# Patient Record
Sex: Male | Born: 1945 | Race: White | Hispanic: No | Marital: Married | Smoking: Former smoker
Health system: Southern US, Community
[De-identification: ages and names within clinical notes are randomized; demographics above are authoritative.]

## PROBLEM LIST (undated history)

## (undated) DIAGNOSIS — N400 Enlarged prostate without lower urinary tract symptoms: Secondary | ICD-10-CM

## (undated) DIAGNOSIS — Z8669 Personal history of other diseases of the nervous system and sense organs: Secondary | ICD-10-CM

## (undated) DIAGNOSIS — M51369 Other intervertebral disc degeneration, lumbar region without mention of lumbar back pain or lower extremity pain: Secondary | ICD-10-CM

## (undated) DIAGNOSIS — I10 Essential (primary) hypertension: Secondary | ICD-10-CM

## (undated) DIAGNOSIS — E1165 Type 2 diabetes mellitus with hyperglycemia: Secondary | ICD-10-CM

## (undated) DIAGNOSIS — Z974 Presence of external hearing-aid: Secondary | ICD-10-CM

## (undated) DIAGNOSIS — I447 Left bundle-branch block, unspecified: Secondary | ICD-10-CM

## (undated) DIAGNOSIS — Z86010 Personal history of colonic polyps: Secondary | ICD-10-CM

## (undated) DIAGNOSIS — I2510 Atherosclerotic heart disease of native coronary artery without angina pectoris: Secondary | ICD-10-CM

## (undated) DIAGNOSIS — K5790 Diverticulosis of intestine, part unspecified, without perforation or abscess without bleeding: Secondary | ICD-10-CM

## (undated) DIAGNOSIS — M19011 Primary osteoarthritis, right shoulder: Secondary | ICD-10-CM

## (undated) DIAGNOSIS — M8580 Other specified disorders of bone density and structure, unspecified site: Secondary | ICD-10-CM

## (undated) DIAGNOSIS — E785 Hyperlipidemia, unspecified: Secondary | ICD-10-CM

## (undated) DIAGNOSIS — IMO0002 Reserved for concepts with insufficient information to code with codable children: Secondary | ICD-10-CM

## (undated) DIAGNOSIS — R51 Headache: Secondary | ICD-10-CM

## (undated) DIAGNOSIS — H269 Unspecified cataract: Secondary | ICD-10-CM

## (undated) DIAGNOSIS — E559 Vitamin D deficiency, unspecified: Secondary | ICD-10-CM

## (undated) DIAGNOSIS — M189 Osteoarthritis of first carpometacarpal joint, unspecified: Secondary | ICD-10-CM

## (undated) DIAGNOSIS — Z87442 Personal history of urinary calculi: Secondary | ICD-10-CM

## (undated) DIAGNOSIS — C801 Malignant (primary) neoplasm, unspecified: Secondary | ICD-10-CM

## (undated) DIAGNOSIS — M5136 Other intervertebral disc degeneration, lumbar region: Secondary | ICD-10-CM

## (undated) HISTORY — DX: Unspecified cataract: H26.9

## (undated) HISTORY — DX: Vitamin D deficiency, unspecified: E55.9

## (undated) HISTORY — DX: Left bundle-branch block, unspecified: I44.7

## (undated) HISTORY — DX: Diverticulosis of intestine, part unspecified, without perforation or abscess without bleeding: K57.90

## (undated) HISTORY — DX: Personal history of urinary calculi: Z87.442

## (undated) HISTORY — DX: Type 2 diabetes mellitus with hyperglycemia: E11.65

## (undated) HISTORY — DX: Personal history of other diseases of the nervous system and sense organs: Z86.69

## (undated) HISTORY — PX: BACK SURGERY: SHX140

## (undated) HISTORY — DX: Essential (primary) hypertension: I10

## (undated) HISTORY — DX: Other specified disorders of bone density and structure, unspecified site: M85.80

## (undated) HISTORY — PX: SHOULDER SURGERY: SHX246

## (undated) HISTORY — DX: Hyperlipidemia, unspecified: E78.5

## (undated) HISTORY — DX: Reserved for concepts with insufficient information to code with codable children: IMO0002

## (undated) HISTORY — DX: Other intervertebral disc degeneration, lumbar region without mention of lumbar back pain or lower extremity pain: M51.369

## (undated) HISTORY — DX: Benign prostatic hyperplasia without lower urinary tract symptoms: N40.0

## (undated) HISTORY — PX: KNEE SURGERY: SHX244

## (undated) HISTORY — DX: Atherosclerotic heart disease of native coronary artery without angina pectoris: I25.10

## (undated) HISTORY — DX: Osteoarthritis of first carpometacarpal joint, unspecified: M18.9

## (undated) HISTORY — PX: POLYPECTOMY: SHX149

## (undated) HISTORY — DX: Other intervertebral disc degeneration, lumbar region: M51.36

## (undated) HISTORY — DX: Personal history of colonic polyps: Z86.010

---

## 2004-07-16 HISTORY — PX: SKIN SURGERY: SHX2413

## 2005-06-30 ENCOUNTER — Emergency Department: Payer: Self-pay | Admitting: Emergency Medicine

## 2005-07-16 HISTORY — PX: FLEXIBLE SIGMOIDOSCOPY: SHX1649

## 2006-03-21 ENCOUNTER — Encounter: Admission: RE | Admit: 2006-03-21 | Discharge: 2006-03-21 | Payer: Self-pay | Admitting: Orthopaedic Surgery

## 2006-04-18 ENCOUNTER — Encounter: Admission: RE | Admit: 2006-04-18 | Discharge: 2006-04-18 | Payer: Self-pay | Admitting: Orthopaedic Surgery

## 2006-05-09 ENCOUNTER — Encounter: Admission: RE | Admit: 2006-05-09 | Discharge: 2006-05-09 | Payer: Self-pay | Admitting: Orthopaedic Surgery

## 2006-06-22 ENCOUNTER — Encounter: Admission: RE | Admit: 2006-06-22 | Discharge: 2006-06-22 | Payer: Self-pay | Admitting: Orthopaedic Surgery

## 2006-11-18 ENCOUNTER — Ambulatory Visit (HOSPITAL_COMMUNITY): Admission: RE | Admit: 2006-11-18 | Discharge: 2006-11-18 | Payer: Self-pay | Admitting: Neurosurgery

## 2006-11-20 ENCOUNTER — Ambulatory Visit (HOSPITAL_COMMUNITY): Admission: RE | Admit: 2006-11-20 | Discharge: 2006-11-20 | Payer: Self-pay | Admitting: Neurosurgery

## 2006-12-26 ENCOUNTER — Inpatient Hospital Stay (HOSPITAL_COMMUNITY): Admission: RE | Admit: 2006-12-26 | Discharge: 2006-12-30 | Payer: Self-pay | Admitting: Neurosurgery

## 2007-07-17 DIAGNOSIS — I2510 Atherosclerotic heart disease of native coronary artery without angina pectoris: Secondary | ICD-10-CM

## 2007-07-17 HISTORY — DX: Atherosclerotic heart disease of native coronary artery without angina pectoris: I25.10

## 2007-07-17 HISTORY — PX: CARDIAC CATHETERIZATION: SHX172

## 2007-09-03 LAB — CBC: platelet count: 310

## 2007-09-03 LAB — SODIUM: Sodium: 142 mmol/L (ref 137–147)

## 2007-09-03 LAB — POTASSIUM: Potassium: 4.2 mmol/L

## 2007-09-09 ENCOUNTER — Ambulatory Visit: Payer: Self-pay | Admitting: Internal Medicine

## 2008-08-12 ENCOUNTER — Ambulatory Visit (HOSPITAL_COMMUNITY): Admission: RE | Admit: 2008-08-12 | Discharge: 2008-08-12 | Payer: Self-pay | Admitting: Orthopaedic Surgery

## 2008-10-14 HISTORY — PX: US ECHOCARDIOGRAPHY: HXRAD669

## 2008-10-19 ENCOUNTER — Encounter: Admission: RE | Admit: 2008-10-19 | Discharge: 2008-10-19 | Payer: Self-pay | Admitting: Orthopaedic Surgery

## 2008-11-01 ENCOUNTER — Ambulatory Visit: Payer: Self-pay

## 2009-05-10 ENCOUNTER — Encounter: Admission: RE | Admit: 2009-05-10 | Discharge: 2009-05-10 | Payer: Self-pay | Admitting: Orthopaedic Surgery

## 2009-06-16 ENCOUNTER — Ambulatory Visit (HOSPITAL_COMMUNITY): Admission: RE | Admit: 2009-06-16 | Discharge: 2009-06-16 | Payer: Self-pay | Admitting: Orthopaedic Surgery

## 2009-07-16 HISTORY — PX: CARDIOVASCULAR STRESS TEST: SHX262

## 2009-08-08 ENCOUNTER — Ambulatory Visit (HOSPITAL_COMMUNITY): Admission: RE | Admit: 2009-08-08 | Discharge: 2009-08-08 | Payer: Self-pay | Admitting: Orthopaedic Surgery

## 2010-07-16 DIAGNOSIS — Z87442 Personal history of urinary calculi: Secondary | ICD-10-CM

## 2010-07-16 HISTORY — DX: Personal history of urinary calculi: Z87.442

## 2010-08-06 ENCOUNTER — Encounter: Payer: Self-pay | Admitting: Orthopaedic Surgery

## 2010-10-01 LAB — CBC
HCT: 38.8 % — ABNORMAL LOW (ref 39.0–52.0)
Hemoglobin: 13.5 g/dL (ref 13.0–17.0)
RBC: 4.15 MIL/uL — ABNORMAL LOW (ref 4.22–5.81)
RDW: 12.5 % (ref 11.5–15.5)
WBC: 6.1 10*3/uL (ref 4.0–10.5)

## 2010-10-01 LAB — BASIC METABOLIC PANEL
GFR calc Af Amer: >60 mL/min (ref >60)
GFR calc non Af Amer: >60 mL/min (ref >60)
Glucose, Bld: 151 mg/dL — ABNORMAL HIGH (ref 70–99)
Potassium: 4.9 mEq/L (ref 3.5–5.1)
Sodium: 141 mEq/L (ref 135–145)

## 2010-10-01 LAB — GLUCOSE, CAPILLARY

## 2010-10-17 LAB — GLUCOSE, CAPILLARY
Glucose-Capillary: 171 mg/dL — ABNORMAL HIGH (ref 70–99)
Glucose-Capillary: 189 mg/dL — ABNORMAL HIGH (ref 70–99)

## 2010-10-18 LAB — URINALYSIS, ROUTINE W REFLEX MICROSCOPIC
Bilirubin Urine: NEGATIVE
Glucose, UA: >1000 mg/dL — AB
Hgb urine dipstick: NEGATIVE
Ketones, ur: NEGATIVE mg/dL
Protein, ur: NEGATIVE mg/dL
pH: 5.5 (ref 5.0–8.0)

## 2010-10-18 LAB — COMPREHENSIVE METABOLIC PANEL
ALT: 31 U/L (ref 0–53)
Albumin: 4.3 g/dL (ref 3.5–5.2)
Alkaline Phosphatase: 71 U/L (ref 39–117)
Calcium: 9.7 mg/dL (ref 8.4–10.5)
GFR calc Af Amer: >60 mL/min (ref >60)
Potassium: 3.8 mEq/L (ref 3.5–5.1)
Sodium: 140 mEq/L (ref 135–145)
Total Protein: 6.9 g/dL (ref 6.0–8.3)

## 2010-10-18 LAB — DIFFERENTIAL
Basophils Relative: 1 % (ref 0–1)
Eosinophils Absolute: 0.2 10*3/uL (ref 0.0–0.7)
Lymphs Abs: 1.4 10*3/uL (ref 0.7–4.0)
Monocytes Absolute: 0.4 10*3/uL (ref 0.1–1.0)
Monocytes Relative: 7 % (ref 3–12)
Neutrophils Relative %: 66 % (ref 43–77)

## 2010-10-18 LAB — PROTIME-INR: INR: 1 (ref 0.00–1.49)

## 2010-10-18 LAB — URINE CULTURE
Colony Count: NO GROWTH
Culture: NO GROWTH

## 2010-10-18 LAB — CBC
MCHC: 34.5 g/dL (ref 30.0–36.0)
Platelets: 262 10*3/uL (ref 150–400)
RDW: 12.6 % (ref 11.5–15.5)

## 2010-10-18 LAB — APTT: aPTT: 30 seconds (ref 24–37)

## 2010-10-30 LAB — URINALYSIS, ROUTINE W REFLEX MICROSCOPIC
Glucose, UA: 250 mg/dL — AB
Hgb urine dipstick: NEGATIVE
pH: 6 (ref 5.0–8.0)

## 2010-10-30 LAB — DIFFERENTIAL
Basophils Absolute: 0 10*3/uL (ref 0.0–0.1)
Eosinophils Absolute: 0.4 10*3/uL (ref 0.0–0.7)
Eosinophils Relative: 6 % — ABNORMAL HIGH (ref 0–5)
Lymphocytes Relative: 22 % (ref 12–46)
Lymphs Abs: 1.5 10*3/uL (ref 0.7–4.0)
Monocytes Absolute: 0.5 10*3/uL (ref 0.1–1.0)

## 2010-10-30 LAB — COMPREHENSIVE METABOLIC PANEL
ALT: 30 U/L (ref 0–53)
AST: 22 U/L (ref 0–37)
Albumin: 4.1 g/dL (ref 3.5–5.2)
CO2: 27 mEq/L (ref 19–32)
Chloride: 108 mEq/L (ref 96–112)
Creatinine, Ser: 0.84 mg/dL (ref 0.4–1.5)
GFR calc Af Amer: >60 mL/min (ref >60)
Potassium: 4.4 mEq/L (ref 3.5–5.1)
Sodium: 141 mEq/L (ref 135–145)
Total Bilirubin: 0.9 mg/dL (ref 0.3–1.2)

## 2010-10-30 LAB — URINE CULTURE
Colony Count: NO GROWTH
Culture: NO GROWTH

## 2010-10-30 LAB — CBC
MCV: 92.9 fL (ref 78.0–100.0)
Platelets: 330 10*3/uL (ref 150–400)
RBC: 4.3 MIL/uL (ref 4.22–5.81)
WBC: 6.7 10*3/uL (ref 4.0–10.5)

## 2010-11-28 NOTE — Op Note (Signed)
NAMECRIST, KRUSZKA               ACCOUNT NO.:  [id number]   MEDICAL RECORD NO.:  [id number]          PATIENT TYPE:  INP   LOCATION:  2550                         FACILITY:  [facility]   PHYSICIAN:  [name], M.D.DATE OF BIRTH:  [birth year]   DATE OF PROCEDURE:  12/26/2006  DATE OF DISCHARGE:                               OPERATIVE REPORT   PREOPERATIVE DIAGNOSES:  1. Lumbar neuroforaminal stenosis.  2. Lumbar spondylosis.  3. Lumbar degenerative disk disease.  4. Lumbar radiculopathy.   POSTOPERATIVE DIAGNOSES:  1. Lumbar neuroforaminal stenosis.  2. Lumbar spondylosis.  3. Lumbar degenerative disk disease.  4. Lumbar radiculopathy.  5. Recurrent right LS lumbar disk herniation (spondylitic).   PROCEDURE:  1. Right L4-5 lumbar laminotomy, facetectomy and foraminotomy with      microdissection.  2. Bilateral L5-S1 lumbar laminotomy, facetectomy and foraminotomy      with microdissection.  3. Right L5-S1 microdiskectomy and transverse posterior lumbar      interbody fusion with AVS peak transverse interbody implant and      VITOSS with bone marrow aspirate.  4. Bilateral L4-S1 posterolateral arthrodesis with radius and      posterior instrumentation, and VITOSS with bone marrow aspirate and      Infuse.   SURGEON:  [name], M.D.   ASSISTANT:  Cristi Loron, M.D.   ANESTHESIA:  General endotracheal.   INDICATIONS:  The patient is a 65 year old man who presented with low  back and right lumbar radicular pain.  He has had two previous lumbar  surgeries years ago.  He was evaluated with MRI scan and myelogram and  with postmyelogram CT scan, which showed significant degenerative  changes with right-sided neuroforaminal stenosis at both L4-5 and L5-S1.  These areas may be treated with decompression and stabilization.   PROCEDURE:  The patient was brought to the operating room, placed under  general endotracheal anesthesia.  The patient was turned to a  prone  position and the lumbar region was prepped with Betadine soap solution  and draped in a sterile fashion.  The midline was infiltrated with local  anesthetic with epinephrine.  A midline incision was made and extended  through the previous midline incision.  It extended both rostral and  caudal to that.  Bipolar cautery and electrocautery was used to maintain  hemostasis.  Dissection was carried down to the lumbar fascia, which was  incised bilaterally.  The paraspinal muscles were resected from spinous  process lamina in subperiosteal fashion.  Self-retaining retractors were  placed and x-rays taken, and we identified the L4-5 and L5-S1  intralaminar spaces.  There was significant scarring in the previous  laminotomy defects on the right side at both L4-5 and L5-S1.   We began our dissection on the right side of the L5-S1.  The microscope  was draped and brought to the field to provide magnification,  illumination and visualization.  The decompression was performed using  microdissection and microsurgical technique.  Laminotomy and facetectomy  was performed using the XMax drill and Kerrison punches.  Dissection was  carried out laterally.  Facetectomy was  performed of the hypertrophic  facet.  We were able to identify the thecal sac.  There was extensive  scarring, and a small rent in the dura occurred.  This was sutured  closed with interrupted 6-0 Prolene sutures.  At the end of the  procedure, we laid a collagen matrix sponge over the repair.  We then  continued dissecting the lateral recess.  There was significant  spondylitic encroachment.  It was found that a portion of this was due  to recurrent spondylitic disk herniation, and this was removed.  We  entered into the disk space.  It was felt that it would be necessary to  perform an interbody fusion, as well.  Diskectomy was performed by  excising the disk herniation and removing it in a piecemeal fashion; and  entering  into the disk space, we continued to remove the degenerated  disk material.  A thorough discectomy was performed.  We then began to  prepare the endplates for the interbody fusion, using paddle curettes to  remove the cartilaginous surfaces.  We then proceeded to do a left L5-S1  laminotomy and facetectomy, exposing the thecal sac and nerve roots.  We  examined the areas of the disk; it was not protruding, and it was felt  that there was no significant compression of the thecal sac or L5 or S1  nerve roots, and therefore the diskectomy was not done from the left  side.  We measured the height of the intervertebral disk space and  selected a 10 mm transverse interbody implant.  We probed the left S1  pedicle and aspirated bone marrow aspirate from the vertebral body of  S1, which was injected over a 10 mL strip of VITOSS.  The VITOSS with  bone marrow aspirate was packed in the interbody implant and then  carefully retracted the thecal sac and nerve root.  The implant was  gently placed within the intervertebral disk space, countersunk and  rotated.  We then took additional VITOSS with bone marrow aspirate and  packed it in the interbody space, as well.  In the end, it was felt that  good decompression of the thecal sac and of the L5 and S1 nerve roots  had been achieved bilaterally.  We did Valsalva the patient after the  dural closure, and no CSF leakage was seen.  We then turned our  attention to the right L4-5 intralaminar space, again with the operating  microscope and microdissection, a right L4-5 lumbar laminotomy and  facetectomy was performed.  There was significant postsurgical scarring  as well as hypertrophic spondylitic overgrowth, and this was careful  removed.  We were able to decompress the thecal sac and the L4 and L5  nerve roots.  The facetectomy was performed to ensure that adequate  decompression for the L4 nerve root at the L4-5 level and for the L5 nerve root at the  L5-S1 level had been achieved.  We examined the anulus  of the disk; it bulged slightly, but there did not appear to be any  significant compression arising from the disk, and therefore diskectomy  and interbody fusion was not performed at L4-L5.  It was felt that the  patient did not have any neural compression on the left side, and  therefore there would be no advantage to exposing the left side at L4-5.   Once the decompression was completed, we then proceeded with  arthrodesis.  We identified pedicle entry sites through the pedicles at  L4, L5 and S1 bilaterally.  Each of the pedicles was probed, examined  with the ball probe.  No cutouts were found.  We then went ahead and  tapped each of the pedicles with a 5.25 mm tap at L4 and L5, and a 6.25  mm tap at S1.  Each, again, was examined with a ball probe.  No cutouts  were found.  And then we placed 5.75 x 45 mm screws bilaterally at L4  and L5, and 6.75 x 40 mm screw at S1.  AP view showed that the left L5  screw was too medial.  We again examined it.  It had breached the medial  aspect of the pedicle and therefore was removed, and we selected a more  lateral entry point and subsequent trajectory, again examining the  pedicle with a ball probe, and no cutout was found.  It was again tapped  with a 5.25 mm tap and the 5.75 x 45 mm screw was placed.  Another AP  view showed it in good position within the pedicle.  We then selected a  70-mm rod for the right side and a 60-mm rod for the left side.  These  were gently pre lordosed rods.  They were placed within the screw heads  and secured with locking caps.  Tightening at each level was performed  with a counter torque.  The bony surfaces were decorticated and then a  layer of Infuse was placed over the bony surfaces, which was then  subsequently covered with a layer of VITOSS with bone marrow aspirate.  Once the bone graft material was packed in place, we again examined the  laminotomy  defects to ensure that good decompression of the neural  structures remained, and then we proceeded with closure.  The VITOSS was  closed with interrupted #1 Vicryl sutures.  The Scarpa's fascia layer  was closed with interrupted inverted #1 Vicryl sutures in the  subcutaneous, subcuticular layers were closed with  interrupted inverted  2-0 Vicryl sutures.  Skin was closed with surgical staples.  The wound  was dressed with Adaptic and sterile gauze and Hyperfix.  The procedure  was tolerated well.  Estimated blood loss was  500 mL.  The patient was given back 200 mL of cell saver blood.  Sponge  and needle counts were correct. Following surgery, the patient was to be  turned back to the supine position, reversed from anesthetic, extubated  and transferred to the recovery room for further care.      [name], M.D.  Electronically Signed     RWN/MEDQ  D:  12/26/2006  T:  12/27/2006  Job:  [id number]

## 2010-11-28 NOTE — Discharge Summary (Signed)
Norman Marks, Norman Marks               ACCOUNT NO.:  [id number]   MEDICAL RECORD NO.:  [id number]          PATIENT TYPE:  INP   LOCATION:  3020                         FACILITY:  [facility]   PHYSICIAN:  [name], M.D.DATE OF BIRTH:  1946/04/08   DATE OF ADMISSION:  12/26/2006  DATE OF DISCHARGE:  12/30/2006                               DISCHARGE SUMMARY   HISTORY AND PHYSICAL:  The patient is a 65 year old man who is evaluated  for right lumbar radiculopathy, had 2 previous lumbar surgeries by Dr.  Durwin Nora a number of years ago.  He started with an MRI scan myelogram and  post myelogram CT scan and the decision made to proceed with  decompression of the arthrodesis.  The L4-5 and L5-S1 levels because of  the advanced degenerative disk disease, spondylosis, lateral recesses,  and neuroforaminal stenosis.  Examination showed good strength and  sensation.   HOSPITAL COURSE:  The patient admitted, underwent a right L4-5 lumbar  laminotomy, fasciectomy, foraminotomy; bilateral L5-S1 lumbar  laminotomy, fasciectomy, and foraminotomy; a right L5-S1 transverse  posterior lumbar interbody fusion with AVS peak interbody implants and V-  toss with bone aspirate, and a bilateral L4-S1 posterior arthrodesis  with radius posterior instrumentation, V-toss with bone aspirate and  infuse.   The patient had extensive scarring on the right side at both L4-5 and L5-  S1.  At the L5-S1 level a small rent occurred in the dural sac and this  was closed with 6-0 Prolene and we laid a collagen matrix sponge over  the repair.  He was kept flat in bed and log rolled side to side for 2  days and then we began to mobilize the patient.  The patient is  successfully mobilized and is up and ambulating in the halls.  His wound  is healing well and there is no swelling or drainage.  However,  yesterday he developed an area of erythema in the anterior right thigh  that appeared to Dr. Channing Mutters as cellulitis, it was  treated with Keflex and  warm compresses and it was much improved.  It was associated with a  temperature to 101.1, the temperature has now dropped to 99.1.  The  patient recalls receiving a shot for nausea in the anterior right thigh,  that area of cellulitis may well be related to that injection.   The patient and his wife have been given instruction on wound care and  activities.  He is to return later this week to have Dr. Channing Mutters remove his  staples and to return to see me in 3 weeks with x-rays.  In the meantime  he is to be actively ambulating out in the fresh air.  He has been  instructed to use Caltrate with vitamin D 1 tablet b.i.d.  We have also  given him a prescription for pain medication Dilaudid 2 mg tablets to  take 1 or 2 q.i.d. p.r.n. pain, 60 tablets prescribed, no refills.  We  have given a prescription for Keflex 500 mg tablets, he is to take 1  q.i.d.  We prescribed 40 tablets and  no refills.   DISCHARGE DIAGNOSES:  1. Lumbar stenosis.  2. Lumbar spondylosis.  3. Lumbar degenerative disk disease.  4. Lumbar radiculopathy.  5. Probable right anterior thigh cellulitis, now improving on Keflex,      probably related to an IM shot.      [name], M.D.  Electronically Signed     RWN/MEDQ  D:  12/30/2006  T:  12/30/2006  Job:  [id number]

## 2010-11-28 NOTE — H&P (Signed)
NAMEANMOL, FLECK               ACCOUNT NO.:  [id number]   MEDICAL RECORD NO.:  [id number]          PATIENT TYPE:  INP   LOCATION:  2550                         FACILITY:  [facility]   PHYSICIAN:  [name], M.D.DATE OF BIRTH:  16-Apr-1946   DATE OF ADMISSION:  12/26/2006  DATE OF DISCHARGE:                              HISTORY & PHYSICAL   HISTORY OF PRESENT ILLNESS:  The patient is a 65 year old right-handed  white male who was evaluated for degenerative changes of the lumbar  spine associated with lumbar radiculopathy.  He has had two previous  lumbar surgeries, by Dr. Loistine Chance C. Deaton in 1994.  And he has had  difficulties with his progressive pain from the low back extending into  the right buttock and the lateral aspect of the right hip and anterior  right thigh over the past seven or more months.  The patient had two  epidural steroid injections without relief.  He was treated with a 12-  day steroid Dosepak, but he could only take it for seven days because  his blood sugar got too high and he had to stop the medication.   The patient initially had MRI performed.  We subsequently saw him and  proceeded with a lumbar myelogram and post myelogram CT scan.  That was  done at [facility] last month.  The study shows extensive  postsurgical changes, as well as extensive degenerative changes.  In  particular, we see significant right-sided L4-5 and L5-S1 facet  arthropathy with resulting lateral recess and neuroforaminal stenosis at  each level.  Next, we discussed our options for further treatment,  including further epidural steroid injections, a limited surgery with  decompression or surgical decompression with arthrodesis, and after  discussing this, it was felt that the patient is now admitted for  decompression and arthrodesis.   PAST MEDICAL HISTORY:  Diabetes, for the past 14 years.  No history of  hypertension, myocardial infarction, cancer, stroke, peptic  ulcer  disease or lung disease.   PAST SURGICAL HISTORY:  Previous surgeries include his two lumbar  surgeries in 1994 by Dr. Elesa Hacker.   ALLERGIES:  HE REPORTS AN INTOLERANCE TO PERCOCET, CAUSING ITCHING, BUT  NO ACTUAL ALLERGIES TO MEDICATIONS.   MEDICATIONS:  Include Glyburide, metformin 2.5/500 b.i.d., Inderal 60 mg  daily, ramipril 6 mg daily, and aspirin 81 mg daily.   FAMILY HISTORY:  His parents have passed on.  His mother had lung cancer  and heart disease.  His father had lung cancer.   SOCIAL HISTORY:  The patient is married.  His wife is a patient of mine.  He does not smoke, drink alcoholic beverages or have a history of  substance abuse.   REVIEW OF SYSTEMS:  Review of systems is notable for those difficulties  described in the history of present illness and past medical history.   PAST MEDICAL HISTORY:  Past medical history is otherwise unremarkable.   PHYSICAL EXAMINATION:  GENERAL:  The patient is a well-developed, well-  nourished white male in no acute distress.  VITAL SIGNS:  His temperature is 97.1,  pulse 84, blood pressure 121/76,  respiratory rate 16, height 5 feet 9, weight 74 kg.  LUNGS:  Lungs are clear to auscultation.  He has symmetric respiratory  excursion.  HEART:  Heart is regular rate and rhythm.  Normal S1, S2.  There is no  murmur.  EXTREMITIES:  Extremity examination shows no clubbing, cyanosis or  edema.  MUSCULOSKELETAL:  Musculoskeletal examination shows he is able to flex  to 80 degrees, to extend to 20 degrees with minimal discomfort on  flexion and extension.  Straight leg raising is negative bilaterally.  There is no tenderness to palpation over the lumbar spinous process from  musculature.  NEUROLOGIC:  Neurologic examination shows full strength in the lower  extremities including the iliopsoas, quadriceps, dorsiflexor, extensor  hallucis longus and plantar flexor bilaterally.  Sensation is intact.  Pinprick to the upper and lower  extremities:  Reflexes are absent in the  biceps, brachial, triceps, minimal quadriceps, gastroc is symmetrical.  Toes are downgoing bilaterally.  He has a normal gait and stance.   IMPRESSION:  Patient with lumbar spondylosis and degenerative disk  disease with resulting in lateral recess and neuroforaminal stenosis,  right-sided, both at L4-5 and L5-S1 with resulting right lumbar  radiculopathy.  The patient is admitted now for L4-5 and L5-S1  decompression with arthrodesis.  We did discuss the nature of surgery,  typical length of surgery, hospitalization, and overall recuperation,  limitations postoperatively:  Our recommendation for postop  immobilization in a lumbar corset and risks to include risk of  infection, bleeding, possible transfusion, risk of nerve root  dysfunction with pain, weakness, numbness or paresthesias.  The risk of  dural tear and CSF leakage and possibly further surgery and a risk of  failure of the arthrodesis and possibly further surgery, and anesthetic  risks of myocardial infarction, stroke, and death.  They understand that  many of these risks are increased due to his history of diabetes.   Understanding all of this, he wishes to go ahead with surgery and is  admitted for such.    _      [name], M.D.  Electronically Signed     RWN/MEDQ  D:  12/26/2006  T:  12/27/2006  Job:  [id number]   cc:   Izell [place]. Elesa Hacker, M.D.

## 2010-11-28 NOTE — Op Note (Signed)
Norman Marks, Norman Marks               ACCOUNT NO.:  [id number]   MEDICAL RECORD NO.:  [id number]          PATIENT TYPE:  AMB   LOCATION:  SDS                          FACILITY:  [facility]   PHYSICIAN:  Claude Manges. Whitfield, M.D.DATE OF BIRTH:  1945/09/03   DATE OF PROCEDURE:  08/12/2008  DATE OF DISCHARGE:  08/12/2008                               OPERATIVE REPORT   PREOPERATIVE DIAGNOSIS:  Impingement, right shoulder.   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   PROCEDURE:  1. Diagnostic arthroscopy, right shoulder.  2. Debridement and synovectomy of glenohumeral joint.  3. Arthroscopic subacromial decompression.   SURGEON:  Claude Manges. Cleophas Dunker, MD   ANESTHESIA:  General with supplemental interscalene nerve block.   COMPLICATIONS:  None.   HISTORY:  A 65 year old gentleman has had problems with his right  shoulder for months.  He has no history of injury or trauma.  He has  evidence of impingement, but has not responded to time, exercises, anti-  inflammatory medicines, and subacromial injection.  He has had an MRI  scan that reveals mild adhesive capsulitis that he did not appear to  have clinically.  He had infra and supraspinatus tendinosis without  evidence of rotator cuff tear and some mild AC joint degenerative  changes which was also not symptomatic.  Because of his poor response to  the conservative treatment, he now wishes to proceed with arthroscopic  evaluation.   PROCEDURE:  With the patient comfortable on the operating table and  under general orotracheal anesthesia, the patient was placed in a semi-  sitting position with the shoulder frame.  Right shoulder was evaluated  under anesthesia and I did not think he had adhesive capsulitis.  Shoulder was then prepped with DuraPrep from the base of neck  circumferentially below the elbow.  Sterile draping was performed.   Marking pen was used to outline the [facility] joint,  the coracoid, and the  acromion about the point of fingerbreadth posterior and medial to the  posterior angle of the acromion, a small stab wound was made and the  arthroscope placed in the shoulder joint.  Diagnostic arthroscopy  revealed diffuse synovitis.  There was chondromalacia of the humeral  head and into a great extent of the glenoid, there was some fraying of  the anterior glenoid labrum and again diffuse synovitis.  A second  portal was established anteriorly.  __________ in the joint was  performed including synovectomy and any of the loose articular cartilage  particularly on the glenoid.  The subscapularis appeared to be intact.  The biceps was intact.  There were no loose bodies.   The arthroscope was then placed in the subacromial space posteriorly,  the cannula into subacromial space anteriorly and third portal was  established in the lateral subacromial space.  There was considerable  bursal material in the subacromial space.  This was evacuated with the  ArthroCare wand.  There was also considerable overhang of the anterior  acromion with obvious impingement and anterior-inferior acromioplasty  was performed with a 6-mm bur and I had a very  nice resection, nice dry  field, with any bleeding was controlled with the Bovie.  I looked at the  [facility] joint, I thought it was intact and therefore left it alone.  There  was no evidence of rotator cuff tear or any bursal surface tearing of  the cuff.   The anterior and lateral portals were closed with 4-0 Ethilon.  Sterile  bulky dressing was applied prior to which I did inject Xylocaine with  epinephrine.  Sling was applied and the patient returned to the  Postanesthesia Recovery Room without complication.   We planned to watch him for 23 hours' observation by the anesthesia and  thought he did very well and as along as he is stable in the recovery,  we will send them.  We will see him back in a week.  Dilaudid for pain  and  sling.      Claude Manges. Cleophas Dunker, M.D.  Electronically Signed     PWW/MEDQ  D:  08/12/2008  T:  08/12/2008  Job:  [id number]

## 2011-05-03 LAB — BASIC METABOLIC PANEL
BUN: 21
CO2: 29
Calcium: 9.4
Chloride: 106
Creatinine, Ser: 0.89
GFR calc Af Amer: >60
GFR calc non Af Amer: >60
Glucose, Bld: 125 — ABNORMAL HIGH
Potassium: 3.8
Sodium: 139

## 2011-05-03 LAB — CBC
HCT: 41.9
Hemoglobin: 14.1
MCHC: 33.6
MCV: 92.4
Platelets: 295
RBC: 4.54
RDW: 13.2
WBC: 7.9

## 2011-05-03 LAB — TYPE AND SCREEN
ABO/RH(D): O POS
Antibody Screen: NEGATIVE

## 2011-05-03 LAB — POCT I-STAT 4, (NA,K, GLUC, HGB,HCT)
Glucose, Bld: 185 — ABNORMAL HIGH
HCT: 30 — ABNORMAL LOW
Hemoglobin: 10.2 — ABNORMAL LOW
Operator id: 219291
Potassium: 3.7
Sodium: 137

## 2011-05-03 LAB — POCT I-STAT GLUCOSE
Glucose, Bld: 159 — ABNORMAL HIGH
Operator id: 219291

## 2011-05-03 LAB — ABO/RH: ABO/RH(D): O POS

## 2011-06-16 DIAGNOSIS — M8580 Other specified disorders of bone density and structure, unspecified site: Secondary | ICD-10-CM

## 2011-06-16 HISTORY — DX: Other specified disorders of bone density and structure, unspecified site: M85.80

## 2011-06-16 HISTORY — PX: OTHER SURGICAL HISTORY: SHX169

## 2012-01-28 ENCOUNTER — Ambulatory Visit: Payer: Self-pay | Admitting: Family Medicine

## 2012-02-01 ENCOUNTER — Ambulatory Visit (INDEPENDENT_AMBULATORY_CARE_PROVIDER_SITE_OTHER): Payer: Medicare Other | Admitting: Family Medicine

## 2012-02-01 ENCOUNTER — Encounter: Payer: Self-pay | Admitting: Family Medicine

## 2012-02-01 VITALS — BP 146/82 | HR 108 | Temp 98.0°F | Ht 69.0 in | Wt 160.0 lb

## 2012-02-01 DIAGNOSIS — M810 Age-related osteoporosis without current pathological fracture: Secondary | ICD-10-CM

## 2012-02-01 DIAGNOSIS — E1165 Type 2 diabetes mellitus with hyperglycemia: Secondary | ICD-10-CM

## 2012-02-01 DIAGNOSIS — Z8669 Personal history of other diseases of the nervous system and sense organs: Secondary | ICD-10-CM

## 2012-02-01 DIAGNOSIS — I10 Essential (primary) hypertension: Secondary | ICD-10-CM

## 2012-02-01 DIAGNOSIS — E785 Hyperlipidemia, unspecified: Secondary | ICD-10-CM

## 2012-02-01 DIAGNOSIS — N400 Enlarged prostate without lower urinary tract symptoms: Secondary | ICD-10-CM

## 2012-02-01 DIAGNOSIS — I2510 Atherosclerotic heart disease of native coronary artery without angina pectoris: Secondary | ICD-10-CM

## 2012-02-01 MED ORDER — SILODOSIN 8 MG PO CAPS: 8.0000 mg | ORAL_CAPSULE | Freq: Every day | ORAL | Status: DC

## 2012-02-01 MED ORDER — PROPRANOLOL HCL 60 MG PO TABS: 60.0000 mg | ORAL_TABLET | Freq: Every day | ORAL | Status: DC

## 2012-02-01 MED ORDER — RAMIPRIL 5 MG PO CAPS: 5.0000 mg | ORAL_CAPSULE | Freq: Every day | ORAL | Status: DC

## 2012-02-01 MED ORDER — GLYBURIDE-METFORMIN 2.5-500 MG PO TABS: 2.0000 | ORAL_TABLET | Freq: Two times a day (BID) | ORAL | Status: DC

## 2012-02-01 MED ORDER — TERIPARATIDE (RECOMBINANT) 600 MCG/2.4ML ~~LOC~~ SOLN: 20.0000 ug | Freq: Every day | SUBCUTANEOUS | Status: DC

## 2012-02-01 MED ORDER — LOVASTATIN 40 MG PO TABS: 40.0000 mg | ORAL_TABLET | Freq: Every day | ORAL | Status: DC

## 2012-02-01 MED ORDER — VITAMIN D (ERGOCALCIFEROL) 1.25 MG (50000 UNIT) PO CAPS: 50000.0000 [IU] | ORAL_CAPSULE | ORAL | Status: DC

## 2012-02-01 NOTE — Progress Notes (Signed)
Subjective:    Patient ID: Norman Marks, male    DOB: 11/10/1945, 66 y.o.   MRN: [id number]  HPI CC: new pt to establish  Cards Dr [name], prior saw PCP Dr. Patrecia Pace.  Presents with wife.  CAD - h/o 75% blockage.  Have requested records from Dr. [name] as well as [facility].  DM - diagnosed 18 yrs ago.  Compliant with lantus and glucovance bid.  Last A1c 6.5%.  occasional low sugars, which he feels.  Has hypoglycemic plan.  (uses relion glucose gel)  Gets vision exam yearly in fall.    Osteoporosis - denies steroid use, significant smoking hx.  Recently found osteoporosis, started on calcium 600mg  bid and vit D 50000 IU weekly, as well as forteo.  HTN - on ramipril and propranolol.  Was told by prior PCP to take qod dosing.  Preventative: Last CPE was early 2013, unsure when exactly.  Caffeine: 1 cup coffee/day. Lives with wife, 1 dog, 5 cats.  Medications and allergies reviewed and updated in chart.  Past histories reviewed and updated if relevant as below. There is no problem list on file for this patient.  Past Medical History  Diagnosis Date  . History of chicken pox   . Diabetes mellitus   . History of kidney stones 2012    x1  . Hx of migraines   . BPH (benign prostatic hypertrophy)   . HTN (hypertension)     mild   Past Surgical History  Procedure Date  . Back surgery     lumbar; x 3  . Knee surgery     Right  . Shoulder surgery     Right   History  Substance Use Topics  . Smoking status: Former Smoker    Types: Cigarettes  . Smokeless tobacco: Former Neurosurgeon    Types: Chew  . Alcohol Use: No   Family History  Problem Relation Age of Onset  . Cancer Father     lung and prostate  . Heart disease Mother   . Cancer Mother     unsure  . Coronary artery disease Sister 41  . Diabetes Father   . Diabetes Brother   . Stroke Neg Hx    Allergies  Allergen Reactions  . Percocet (Oxycodone-Acetaminophen) Itching   Current Outpatient Prescriptions on  File Prior to Visit  Medication Sig Dispense Refill  . Calcium Carb-Cholecalciferol (CALCIUM-VITAMIN D) 600-400 MG-UNIT TABS Take 1 tablet by mouth 2 (two) times daily.      Marland Kitchen glyBURIDE-metformin (GLUCOVANCE) 2.5-500 MG per tablet Take 2 tablets by mouth 2 (two) times daily with a meal.  120 tablet  11  . insulin glargine (LANTUS) 100 UNIT/ML injection Inject 12 Units into the skin daily.      Marland Kitchen lovastatin (MEVACOR) 40 MG tablet Take 1 tablet (40 mg total) by mouth at bedtime.  30 tablet  11  . propranolol (INDERAL) 60 MG tablet Take 1 tablet (60 mg total) by mouth daily.  30 tablet  11  . ramipril (ALTACE) 5 MG capsule Take 1 capsule (5 mg total) by mouth daily.  30 capsule  11     Review of Systems  Constitutional: Negative for fever, chills, activity change, appetite change, fatigue and unexpected weight change.  HENT: Negative for hearing loss and neck pain.   Eyes: Negative for visual disturbance.  Respiratory: Negative for cough, chest tightness, shortness of breath and wheezing.   Cardiovascular: Negative for chest pain, palpitations and leg swelling.  Gastrointestinal: Positive  for abdominal pain. Negative for nausea, vomiting, diarrhea, constipation, blood in stool and abdominal distention.  Genitourinary: Negative for hematuria and difficulty urinating.  Musculoskeletal: Negative for myalgias and arthralgias.  Skin: Negative for rash.  Neurological: Negative for dizziness, seizures, syncope and headaches.  Hematological: Does not bruise/bleed easily.  Psychiatric/Behavioral: Negative for dysphoric mood. The patient is not nervous/anxious.        Objective:   Physical Exam  Nursing note and vitals reviewed. Constitutional: He is oriented to person, place, and time. He appears well-developed and well-nourished. No distress.  HENT:  Head: Normocephalic and atraumatic.  Right Ear: Hearing, tympanic membrane, external ear and ear canal normal.  Left Ear: Hearing, tympanic  membrane, external ear and ear canal normal.  Nose: Nose normal.  Mouth/Throat: Oropharynx is clear and moist.  Eyes: Conjunctivae and EOM are normal. Pupils are equal, round, and reactive to light.  Neck: Normal range of motion. Neck supple. Carotid bruit is not present. No thyromegaly present.  Cardiovascular: Normal rate, regular rhythm, normal heart sounds and intact distal pulses.   No murmur heard. Pulses:      Radial pulses are 2+ on the right side, and 2+ on the left side.  Pulmonary/Chest: Effort normal and breath sounds normal. No respiratory distress. He has no wheezes. He has no rales.  Abdominal: Soft. Bowel sounds are normal. He exhibits no distension and no mass. There is no tenderness. There is no rebound and no guarding.  Musculoskeletal: Normal range of motion. He exhibits no edema.  Lymphadenopathy:    He has no cervical adenopathy.  Neurological: He is alert and oriented to person, place, and time.       CN grossly intact, station and gait intact  Skin: Skin is warm and dry. No rash noted.  Psychiatric: He has a normal mood and affect. His behavior is normal. Judgment and thought content normal.      Assessment & Plan:

## 2012-02-01 NOTE — Patient Instructions (Addendum)
Take ramipril and propranolol daily. Good to meet you, call us with questions. I've requested records from Dr. Patrecia Pace as well as Dr. [name]. Return in 3months for follow up, prior fasting for blood work.

## 2012-02-02 ENCOUNTER — Encounter: Payer: Self-pay | Admitting: Family Medicine

## 2012-02-02 ENCOUNTER — Other Ambulatory Visit: Payer: Self-pay | Admitting: Family Medicine

## 2012-02-02 DIAGNOSIS — N138 Other obstructive and reflux uropathy: Secondary | ICD-10-CM | POA: Insufficient documentation

## 2012-02-02 DIAGNOSIS — E559 Vitamin D deficiency, unspecified: Secondary | ICD-10-CM

## 2012-02-02 DIAGNOSIS — I2510 Atherosclerotic heart disease of native coronary artery without angina pectoris: Secondary | ICD-10-CM | POA: Insufficient documentation

## 2012-02-02 DIAGNOSIS — E118 Type 2 diabetes mellitus with unspecified complications: Secondary | ICD-10-CM | POA: Insufficient documentation

## 2012-02-02 DIAGNOSIS — M8580 Other specified disorders of bone density and structure, unspecified site: Secondary | ICD-10-CM | POA: Insufficient documentation

## 2012-02-02 DIAGNOSIS — Z8669 Personal history of other diseases of the nervous system and sense organs: Secondary | ICD-10-CM | POA: Insufficient documentation

## 2012-02-02 DIAGNOSIS — E785 Hyperlipidemia, unspecified: Secondary | ICD-10-CM

## 2012-02-02 DIAGNOSIS — I10 Essential (primary) hypertension: Secondary | ICD-10-CM

## 2012-02-02 DIAGNOSIS — E1169 Type 2 diabetes mellitus with other specified complication: Secondary | ICD-10-CM | POA: Insufficient documentation

## 2012-02-02 DIAGNOSIS — E1165 Type 2 diabetes mellitus with hyperglycemia: Secondary | ICD-10-CM

## 2012-02-02 NOTE — Assessment & Plan Note (Signed)
According to notes he brings, last A1c 11/2011 6.6%. Have requested full records from Dr. Patrecia Pace.

## 2012-02-02 NOTE — Assessment & Plan Note (Signed)
On ramipril and propranolol.  Mildly tachycardic today, will likely change B blocker to coreg bid.  Will review records from Dr. Patrecia Pace first.

## 2012-02-02 NOTE — Assessment & Plan Note (Signed)
Have requested records from Dr. [name]. On ASA, beta blocker (albeit propranolol), and ACEI.

## 2012-02-02 NOTE — Assessment & Plan Note (Signed)
On calcium/vit D and forteo injections daily. No dexa in records he brings so wilil request from Dr. Patrecia Pace.

## 2012-02-02 NOTE — Assessment & Plan Note (Signed)
Continue statin.  May check FLP next blood draw.

## 2012-02-02 NOTE — Assessment & Plan Note (Signed)
-  Continue rapaflo 

## 2012-02-06 ENCOUNTER — Encounter: Payer: Self-pay | Admitting: Family Medicine

## 2012-02-08 ENCOUNTER — Other Ambulatory Visit: Payer: Self-pay | Admitting: *Deleted

## 2012-02-08 MED ORDER — INSULIN PEN NEEDLE 31G X 6 MM MISC: Status: DC

## 2012-02-14 ENCOUNTER — Encounter: Payer: Self-pay | Admitting: Family Medicine

## 2012-02-22 ENCOUNTER — Encounter: Payer: Self-pay | Admitting: Family Medicine

## 2012-03-02 ENCOUNTER — Other Ambulatory Visit: Payer: Self-pay | Admitting: Family Medicine

## 2012-04-21 LAB — HM DIABETES EYE EXAM

## 2012-05-02 ENCOUNTER — Other Ambulatory Visit (INDEPENDENT_AMBULATORY_CARE_PROVIDER_SITE_OTHER): Payer: Medicare Other

## 2012-05-02 DIAGNOSIS — E559 Vitamin D deficiency, unspecified: Secondary | ICD-10-CM

## 2012-05-02 DIAGNOSIS — I10 Essential (primary) hypertension: Secondary | ICD-10-CM

## 2012-05-02 DIAGNOSIS — E1165 Type 2 diabetes mellitus with hyperglycemia: Secondary | ICD-10-CM

## 2012-05-02 DIAGNOSIS — E785 Hyperlipidemia, unspecified: Secondary | ICD-10-CM

## 2012-05-02 DIAGNOSIS — IMO0001 Reserved for inherently not codable concepts without codable children: Secondary | ICD-10-CM

## 2012-05-02 DIAGNOSIS — IMO0002 Reserved for concepts with insufficient information to code with codable children: Secondary | ICD-10-CM

## 2012-05-02 LAB — COMPREHENSIVE METABOLIC PANEL
AST: 16 U/L (ref 0–37)
Albumin: 3.9 g/dL (ref 3.5–5.2)
BUN: 14 mg/dL (ref 6–23)
CO2: 29 mEq/L (ref 19–32)
Calcium: 9.1 mg/dL (ref 8.4–10.5)
Chloride: 104 mEq/L (ref 96–112)
Creatinine, Ser: 0.7 mg/dL (ref 0.4–1.5)
GFR: 119.45 mL/min (ref >60.00)
Glucose, Bld: 187 mg/dL — ABNORMAL HIGH (ref 70–99)
Potassium: 4.5 mEq/L (ref 3.5–5.1)

## 2012-05-02 LAB — LIPID PANEL
Cholesterol: 111 mg/dL (ref 0–200)
HDL: 35.8 mg/dL — ABNORMAL LOW (ref >39.00)
Total CHOL/HDL Ratio: 3
Triglycerides: 106 mg/dL (ref 0.0–149.0)

## 2012-05-02 LAB — MICROALBUMIN / CREATININE URINE RATIO: Microalb, Ur: 0.6 mg/dL (ref 0.0–1.9)

## 2012-05-02 LAB — HEMOGLOBIN A1C: Hgb A1c MFr Bld: 7.6 % — ABNORMAL HIGH (ref 4.6–6.5)

## 2012-05-09 ENCOUNTER — Ambulatory Visit (INDEPENDENT_AMBULATORY_CARE_PROVIDER_SITE_OTHER): Payer: Medicare Other | Admitting: Family Medicine

## 2012-05-09 ENCOUNTER — Encounter: Payer: Self-pay | Admitting: Family Medicine

## 2012-05-09 VITALS — BP 128/74 | HR 96 | Temp 97.9°F | Wt 160.8 lb

## 2012-05-09 DIAGNOSIS — E291 Testicular hypofunction: Secondary | ICD-10-CM

## 2012-05-09 DIAGNOSIS — E349 Endocrine disorder, unspecified: Secondary | ICD-10-CM

## 2012-05-09 DIAGNOSIS — IMO0001 Reserved for inherently not codable concepts without codable children: Secondary | ICD-10-CM

## 2012-05-09 DIAGNOSIS — N400 Enlarged prostate without lower urinary tract symptoms: Secondary | ICD-10-CM

## 2012-05-09 DIAGNOSIS — E1165 Type 2 diabetes mellitus with hyperglycemia: Secondary | ICD-10-CM

## 2012-05-09 DIAGNOSIS — M810 Age-related osteoporosis without current pathological fracture: Secondary | ICD-10-CM

## 2012-05-09 DIAGNOSIS — I10 Essential (primary) hypertension: Secondary | ICD-10-CM

## 2012-05-09 DIAGNOSIS — I2510 Atherosclerotic heart disease of native coronary artery without angina pectoris: Secondary | ICD-10-CM

## 2012-05-09 DIAGNOSIS — E785 Hyperlipidemia, unspecified: Secondary | ICD-10-CM

## 2012-05-09 LAB — LUTEINIZING HORMONE: LH: 6.49 m[IU]/mL (ref 1.50–9.30)

## 2012-05-09 MED ORDER — CARVEDILOL 3.125 MG PO TABS: 3.1250 mg | ORAL_TABLET | Freq: Two times a day (BID) | ORAL | Status: DC

## 2012-05-09 MED ORDER — INSULIN GLARGINE 100 UNIT/ML ~~LOC~~ SOLN: 12.0000 [IU] | SUBCUTANEOUS | Status: DC

## 2012-05-09 NOTE — Assessment & Plan Note (Addendum)
Merits close f/u if testosterone supplementation started. Has stopped rapaflo.  Didn't think needed this medicine.

## 2012-05-09 NOTE — Assessment & Plan Note (Signed)
Chronic. Stable. Again mildly tachycardic today. Change from propranolol to coreg. Monitor at [facility].

## 2012-05-09 NOTE — Assessment & Plan Note (Signed)
Chronic, deteriorated control. Prior very well controlled. Discussed addition of januvia vs increase lantus. As starting coreg anticipate some improvement in glycemic control. Regardless will start with slow titration of lantus to goal 15 u daily, but asked him to keep track of mid day sugars to ensure no lows. Pt agrees with plan. Foot exam today.

## 2012-05-09 NOTE — Assessment & Plan Note (Signed)
Chronic, stable. Reviewed FLP with pt - adequate control.  Continue statin. Lab Results  Component Value Date   CHOL 111 05/02/2012   HDL 35.80* 05/02/2012   LDLCALC 54 05/02/2012   TRIG 106.0 05/02/2012   CHOLHDL 3 05/02/2012

## 2012-05-09 NOTE — Patient Instructions (Addendum)
Return in 4 months for medicare wellness visit, prior fasting for blood work. Blood work today. Stop propranolol today, start carvedilol at 3.125mg  twice daily - keep eye on blood pressures with the change. Increase lantus by 1 unit every 3 days if fasting sugars averaging consistently >150 to a goal of 15 units of lantus.  With that I do want you to check occasional PM sugars 2 hours after lunch or dinner to ensure not dropping too low. Keep an eye on diet - work on aerobic exercise.

## 2012-05-09 NOTE — Assessment & Plan Note (Addendum)
As of last dexa.  On forteo, calcium/vit D and weekly vit D. Has had low testosterone on past - see below.

## 2012-05-09 NOTE — Assessment & Plan Note (Signed)
Low reading in past at Dr. Gregery Na office. Discussed common symptoms of hypogonadism as well as recommended w/u. Discussed implications of testosterone replacement and need for routine monitoring. Pt desires to pursue evaluation and will then decide on treatment. Draw testosterone panel today along with LH.

## 2012-05-09 NOTE — Assessment & Plan Note (Signed)
Asxs. On ASA, and ACEI.  Switch Bblocker to coreg today.

## 2012-05-09 NOTE — Progress Notes (Signed)
  Subjective:    Patient ID: TAJAI IHDE, male    DOB: [birth year], 66 y.o.   MRN: [id number]  HPI CC: 35mo f/u  Mr. Banh is a pleasant patient with h/o medically treated CAD, HTN, HLD, T2DM, BPH, and osteoporosis who presents today for 3 mo f/u.  Osteoporosis - takes forteo daily and vitD/calcium daily, vit D 50000 IU weekly.  No recent falls or fractures.  No smoking. Low testosterone level in past.  Had injection x1.  Would like further evaluation of this.  DM - sugars running higher.  No low sugars or hypoglycemic sxs.  Vision exam a few weeks ago.  Checks sugars fasting in morning QD.  No paresthesias.    Foot exam today.  Already had pneumovax. Lab Results  Component Value Date   HGBA1C 7.6* 05/02/2012   H/o CAD medically treated, h/o HTN - compliant with meds.  No HA, vision changes, CP/tightness, SOB, leg swelling.  Has seen Dr. [name] in past, not recently.  Has not had shingles shot.  Not interested 2/2 financial cost.  Past Medical History  Diagnosis Date  . History of chicken pox   . Diabetes type 2, uncontrolled   . Hx of migraines   . BPH (benign prostatic hypertrophy)   . HTN (hypertension)     mild  . HLD (hyperlipidemia)   . CAD (coronary artery disease) 2009    [name] (cardiolite 2009 with 70% stenosis of proximal ramus, not amenable to PCI, rec treat medically)  . Osteoporosis 06/2011    T score -3  . History of nephrolithiasis 2012    L ureter, R renal pelvis  . DDD (degenerative disc disease), lumbar     s/p surgery  . Vitamin D deficiency   . History of bundle branch block      Review of Systems Per HPI    Objective:   Physical Exam  Nursing note and vitals reviewed. Constitutional: He is oriented to person, place, and time. He appears well-developed and well-nourished. No distress.  HENT:  Head: Normocephalic and atraumatic.  Nose: Nose normal.  Mouth/Throat: Oropharynx is clear and moist. No oropharyngeal exudate.  Eyes: Conjunctivae  normal and EOM are normal. Pupils are equal, round, and reactive to light. No scleral icterus.  Neck: Normal range of motion. Neck supple. Carotid bruit is not present.  Cardiovascular: Normal rate, regular rhythm, normal heart sounds and intact distal pulses.   No murmur heard. Pulses:      Radial pulses are 2+ on the right side, and 2+ on the left side.  Pulmonary/Chest: Effort normal and breath sounds normal. No respiratory distress. He has no wheezes. He has no rales.  Musculoskeletal: Normal range of motion. He exhibits no edema.       Diabetic foot exam: Normal inspection No skin breakdown No calluses  Normal DP/PT pulses Normal sensation to light tough and monofilament Nails normal   Lymphadenopathy:    He has no cervical adenopathy.  Neurological: He is alert and oriented to person, place, and time.       CN grossly intact, station and gait intact  Skin: Skin is warm and dry. No rash noted.  Psychiatric: He has a normal mood and affect. His behavior is normal. Judgment and thought content normal.       Assessment & Plan:

## 2012-05-12 LAB — TESTOSTERONE, FREE, TOTAL, SHBG
Sex Hormone Binding: 34 nmol/L (ref 13–71)
Testosterone, Free: 41.4 pg/mL — ABNORMAL LOW (ref 47.0–244.0)
Testosterone-% Free: 1.9 % (ref 1.6–2.9)
Testosterone: 220.48 ng/dL — ABNORMAL LOW (ref 300–890)

## 2012-05-21 ENCOUNTER — Ambulatory Visit (INDEPENDENT_AMBULATORY_CARE_PROVIDER_SITE_OTHER): Payer: Medicare Other | Admitting: Family Medicine

## 2012-05-21 ENCOUNTER — Encounter: Payer: Self-pay | Admitting: Family Medicine

## 2012-05-21 VITALS — BP 108/68 | HR 84 | Temp 98.1°F | Wt 157.8 lb

## 2012-05-21 DIAGNOSIS — I10 Essential (primary) hypertension: Secondary | ICD-10-CM

## 2012-05-21 DIAGNOSIS — N400 Enlarged prostate without lower urinary tract symptoms: Secondary | ICD-10-CM

## 2012-05-21 DIAGNOSIS — M810 Age-related osteoporosis without current pathological fracture: Secondary | ICD-10-CM

## 2012-05-21 DIAGNOSIS — E349 Endocrine disorder, unspecified: Secondary | ICD-10-CM

## 2012-05-21 DIAGNOSIS — E291 Testicular hypofunction: Secondary | ICD-10-CM

## 2012-05-21 DIAGNOSIS — E1165 Type 2 diabetes mellitus with hyperglycemia: Secondary | ICD-10-CM

## 2012-05-21 LAB — PSA: PSA: 2.11 ng/mL (ref 0.10–4.00)

## 2012-05-21 MED ORDER — METFORMIN HCL 500 MG PO TABS: 500.0000 mg | ORAL_TABLET | Freq: Two times a day (BID) | ORAL | Status: DC

## 2012-05-21 MED ORDER — TESTOSTERONE 20.25 MG/1.25GM (1.62%) TD GEL: 20.2500 mg | Freq: Every day | TRANSDERMAL | Status: DC

## 2012-05-21 NOTE — Assessment & Plan Note (Signed)
Decreased sugars with addition of coreg.  Advised to monitor for hypoglycemia. As on insulin, will stop glipizide and only treat with metformin.

## 2012-05-21 NOTE — Assessment & Plan Note (Addendum)
With normal LH, ?secondary hypogonadism, check T4 and prolactin today.  Already had normal TSH. Discussed treatment of testosterone as well as things that will need to be monitored including prostate (with BPH and fmhx prostate cancer - including possibility of feeding indolent prostate cancer), liver, cholesterol levels, hemoglobin. Discussed different treatment options - start with androgel.  Have printed out script for pt to take to pharmacy.   Discussed use and not having wife exposed to this. Return to clinic in 1 month to recheck levels, keep appt for early next week. Check PSA today.

## 2012-05-21 NOTE — Progress Notes (Signed)
  Subjective:    Patient ID: Norman Marks, male    DOB: 1946-02-26, 66 y.o.   MRN: [id number]  HPI CC: discuss testosterone levels.  Low testosterone level in past. Had injection x1 by prior PCP. Endorses fatigue and depressed mood, mildly irritable.  Denies trouble with sex drive/libido.  Recent testosterone check - LH was normal.  Total and free testosterone were low. Lab Results  Component Value Date   TESTOSTERONE 220.48* 05/09/2012   H/o BPH, unclear if currently on rapaflo. No recent PSA.  That has been normal in past.  Never seen urologist. H/o osteoporosis. Father with h/o prostate cancer - father dx at age of 65s.  DM - has been having some low sugars since starting carvedilol.  Low of 41.  Has had to back off lantus, currently taking 11 units.  On glucovance bid.   Lab Results  Component Value Date   CREATININE 0.7 05/02/2012    HTN - recently changed from propranolol to carvedilol.  Denies hypotensive sxs.  Advised to keep track of bp at home.  Notes well controlled bp at home. BP Readings from Last 3 Encounters:  05/21/12 108/68  05/09/12 128/74  02/01/12 146/82    Past Medical History  Diagnosis Date  . History of chicken pox   . Diabetes type 2, uncontrolled   . Hx of migraines   . BPH (benign prostatic hypertrophy)   . HTN (hypertension)     mild  . HLD (hyperlipidemia)   . CAD (coronary artery disease) 2009    [name] (cardiolite 2009 with 70% stenosis of proximal ramus, not amenable to PCI, rec treat medically)  . Osteoporosis 06/2011    T score -3  . History of nephrolithiasis 2012    L ureter, R renal pelvis  . DDD (degenerative disc disease), lumbar     s/p surgery  . Vitamin D deficiency   . History of bundle branch block     Family History  Problem Relation Age of Onset  . Cancer Father     lung and prostate  . Heart disease Mother   . Cancer Mother     unsure  . Coronary artery disease Sister 8  . Diabetes Father   . Diabetes  Brother   . Stroke Neg Hx     Review of Systems Per HPI    Objective:   Physical Exam  Nursing note and vitals reviewed. Genitourinary: Prostate normal. Rectal exam shows external hemorrhoid. Rectal exam shows no internal hemorrhoid, no fissure, no mass, no tenderness and anal tone normal. Prostate is not enlarged (20gm, firm throughout) and not tender.       Assessment & Plan:

## 2012-05-21 NOTE — Assessment & Plan Note (Signed)
Very stable on low dose coreg and ramipril.  Advised to monitor for hypotension. Improved tachycardia.

## 2012-05-21 NOTE — Assessment & Plan Note (Signed)
Testosterone supplementation may help with this.

## 2012-05-21 NOTE — Assessment & Plan Note (Signed)
Not taking rapaflo currently. discussed possible worsening of BPH sxs if testosterone started.   Will closely monitor.

## 2012-05-21 NOTE — Patient Instructions (Addendum)
Let's try stopping glucovance and starting metformin at 500mg  twice daily (i've sent this into pharmacy). Keep close eye on blood pressure and blood sugars while we're making these changes. For testosterone - try androgel cream one application onto skin daily. Return in 1 month for labwork to check testosterone levels again. Good to see you today, call us with quesitons.

## 2012-05-22 ENCOUNTER — Encounter: Payer: Self-pay | Admitting: *Deleted

## 2012-05-22 LAB — PROLACTIN: Prolactin: 7.6 ng/mL (ref 2.1–17.1)

## 2012-05-26 ENCOUNTER — Telehealth: Payer: Self-pay

## 2012-05-26 MED ORDER — TESTOSTERONE 50 MG/5GM (1%) TD GEL: 5.0000 g | Freq: Every day | TRANSDERMAL | Status: DC

## 2012-05-26 NOTE — Telephone Encounter (Signed)
[facility] [address] faxed note Androgel would require a prior auth; does Dr Sharen Hones want to try Androderm or Testim which are preferred by insurance co/ Please advise.

## 2012-05-26 NOTE — Telephone Encounter (Signed)
Rx called in as directed.   

## 2012-05-26 NOTE — Telephone Encounter (Signed)
Ok to try testim, have sent in new med.  plz phone in if able to.

## 2012-06-22 ENCOUNTER — Other Ambulatory Visit: Payer: Self-pay | Admitting: Family Medicine

## 2012-06-22 DIAGNOSIS — E349 Endocrine disorder, unspecified: Secondary | ICD-10-CM

## 2012-06-24 ENCOUNTER — Other Ambulatory Visit: Payer: Self-pay | Admitting: Family Medicine

## 2012-06-25 ENCOUNTER — Other Ambulatory Visit: Payer: Self-pay | Admitting: Family Medicine

## 2012-06-25 ENCOUNTER — Other Ambulatory Visit (INDEPENDENT_AMBULATORY_CARE_PROVIDER_SITE_OTHER): Payer: 59

## 2012-06-25 DIAGNOSIS — E349 Endocrine disorder, unspecified: Secondary | ICD-10-CM

## 2012-06-25 DIAGNOSIS — E291 Testicular hypofunction: Secondary | ICD-10-CM

## 2012-06-25 LAB — TESTOSTERONE: Testosterone: 184.22 ng/dL — ABNORMAL LOW (ref 350.00–890.00)

## 2012-06-25 MED ORDER — TESTOSTERONE 50 MG/5GM (1%) TD GEL: 10.0000 g | Freq: Every day | TRANSDERMAL | Status: DC

## 2012-06-25 NOTE — Progress Notes (Signed)
Rx called in as directed.   

## 2012-06-25 NOTE — Progress Notes (Signed)
plz phone in testosterone supplementation new dose 10gm daily.

## 2012-06-27 ENCOUNTER — Telehealth: Payer: Self-pay | Admitting: Family Medicine

## 2012-06-27 NOTE — Telephone Encounter (Signed)
plz verify with pharmacy.  Sounds like pharmacy mistake - shouldn't have been a problem as I specified to send in quantity sufficient for 1 month on new dose.  I want enough for 10gm on skin daily. Pharmacy should have filled enough for 1 month. Then notify pt.

## 2012-06-27 NOTE — Telephone Encounter (Signed)
Spoke with pharmacist and she said he was given 30 individual tubes and was to use 1 a day. I advised that he was to use 10 grams a day. She isn't sure if it was written wrong on the hard copy or what the problem was but said she would fix it and give him enough to last through the month with 5 refills. She also said the manufacturer considered the 5 mg tube the normal dose and does not produce a larger tube. Patient aware and will check with pharmacy later to pick up remaining medication.

## 2012-06-27 NOTE — Telephone Encounter (Signed)
Patient Information:  Caller Name: Keishawn  Phone: [phone]  Patient: Norman Marks, Norman Marks  Gender: Male  DOB: 08/03/1945  Age: 66 Years  PCP: Eustaquio Boyden [facility])  Office Follow Up:  Does the office need to follow up with this patient?: Yes  Instructions For The Office: PATIENT REQUESTING ADDIT. REFILL ON TESTOSTERONE. THE WAY THE MOST WRITTEN SCRIPT WRITTEN WILL HAVE PATIENT RUNNING OUT OF MED HALF WAY THROUGH THE 30 DAY SUPPLY. (PLEASE REVIEW TRIAGE NOTE.)  RN Note:  Med request to be sent to Dr. Sharen Hones.  Symptoms  Reason For Call & Symptoms: Caller states he spoke with  someone ( unknown ) in the office regarding his Testosterone gel on 06/25/12 and was advised to double up on his dose of 50mg /5gm to 100mg /10gm daily . (Reviewed EPIC)  Caller states when he went to pick up his prescription at [facility] in [place], the med was written for the  50mg /5gm dose and directions were to apply 10 g to skin. Patient states if he doubles the dose of med prescribed , he will run out of the med half way through the 30 day  supply and is requesting an addit. script to cover the remainder of the 30 days.  ( This also will require patient to have to pay two copays for the month.)  Patient is asking if after this month if medication can be written as : Testosterone 100mg /10gm so patient will have enough for entire 30 days supply and will only have to pay one copay.  Reviewed Health History In EMR: N/A  Reviewed Medications In EMR: N/A  Reviewed Allergies In EMR: N/A  Reviewed Surgeries / Procedures: N/A  Date of Onset of Symptoms: 06/27/2012  Guideline(s) Used:  No Protocol Available - Information Only  Disposition Per Guideline:   Discuss with PCP and Callback by Nurse Today  Reason For Disposition Reached:   Nursing judgment  Advice Given:  N/A

## 2012-07-16 DIAGNOSIS — M189 Osteoarthritis of first carpometacarpal joint, unspecified: Secondary | ICD-10-CM

## 2012-07-16 HISTORY — DX: Osteoarthritis of first carpometacarpal joint, unspecified: M18.9

## 2012-07-23 ENCOUNTER — Other Ambulatory Visit: Payer: Medicare Other

## 2012-07-23 ENCOUNTER — Other Ambulatory Visit (INDEPENDENT_AMBULATORY_CARE_PROVIDER_SITE_OTHER): Payer: Medicare Other

## 2012-07-23 DIAGNOSIS — E349 Endocrine disorder, unspecified: Secondary | ICD-10-CM

## 2012-07-23 DIAGNOSIS — E291 Testicular hypofunction: Secondary | ICD-10-CM

## 2012-07-23 LAB — TESTOSTERONE: Testosterone: 237.33 ng/dL — ABNORMAL LOW (ref 350.00–890.00)

## 2012-07-25 ENCOUNTER — Other Ambulatory Visit: Payer: Self-pay | Admitting: Family Medicine

## 2012-07-25 DIAGNOSIS — E349 Endocrine disorder, unspecified: Secondary | ICD-10-CM

## 2012-07-25 MED ORDER — TESTOSTERONE 30 MG/ACT TD SOLN: 60.0000 mg | Freq: Every day | TRANSDERMAL | Status: DC

## 2012-07-31 ENCOUNTER — Telehealth: Payer: Self-pay | Admitting: Family Medicine

## 2012-07-31 MED ORDER — TESTOSTERONE 30 MG/ACT TD SOLN: 2.0000 | TRANSDERMAL | Status: DC

## 2012-07-31 NOTE — Telephone Encounter (Signed)
Pt left v/m requesting call back [facility] [address] does not have prescription for Axiron.pt request call back 

## 2012-07-31 NOTE — Telephone Encounter (Signed)
Please phone in again and notify pt sent in.

## 2012-07-31 NOTE — Telephone Encounter (Signed)
Pt left v/m requesting call back [facility] [address] does not have prescription for Axiron.pt request call back

## 2012-07-31 NOTE — Telephone Encounter (Signed)
Rx called in as directed. Patient notified.  

## 2012-07-31 NOTE — Telephone Encounter (Signed)
Per Rena - Pt left v/m requesting call back [facility] [address] does not have prescription for Axiron.pt request call back

## 2012-07-31 NOTE — Telephone Encounter (Signed)
Rx called in and patient notified.  

## 2012-08-01 ENCOUNTER — Encounter: Payer: Self-pay | Admitting: Family Medicine

## 2012-08-01 ENCOUNTER — Ambulatory Visit (INDEPENDENT_AMBULATORY_CARE_PROVIDER_SITE_OTHER): Payer: Medicare Other | Admitting: Family Medicine

## 2012-08-01 VITALS — BP 136/72 | HR 86 | Temp 98.1°F | Wt 157.8 lb

## 2012-08-01 DIAGNOSIS — R059 Cough, unspecified: Secondary | ICD-10-CM

## 2012-08-01 DIAGNOSIS — R05 Cough: Secondary | ICD-10-CM | POA: Insufficient documentation

## 2012-08-01 NOTE — Progress Notes (Signed)
  Subjective:    Patient ID: Norman Marks, male    DOB: 12-Jan-1946, 67 y.o.   MRN: [id number]  HPI CC: cough  2 wk h/o dry harsh cough.  started with sore throat.  Not productive.  Mild coryza.  Mild PNDrainage.  Mild ST.  Cough worse at night.  Trouble sleeping 2/2 cough.  Keeps RN.  So far has tried nothing for this other than aleve (which helped).  No fevers/chills, ear or tooth pain.  Wife recently sick. No h/o smoking. No h/o asthma/COPD.  No h/o GERD or heartburn sxs. No h/o allergic rhinitis.  Past Medical History  Diagnosis Date  . History of chicken pox   . Diabetes type 2, uncontrolled   . Hx of migraines   . BPH (benign prostatic hypertrophy)   . HTN (hypertension)     mild  . HLD (hyperlipidemia)   . CAD (coronary artery disease) 2009    [name] (cardiolite 2009 with 70% stenosis of proximal ramus, not amenable to PCI, rec treat medically)  . Osteoporosis 06/2011    T score -3  . History of nephrolithiasis 2012    L ureter, R renal pelvis  . DDD (degenerative disc disease), lumbar     s/p surgery  . Vitamin D deficiency   . History of bundle branch block     Review of Systems Per HPI    Objective:   Physical Exam  Nursing note and vitals reviewed. Constitutional: He appears well-developed and well-nourished. No distress.  HENT:  Head: Normocephalic and atraumatic.  Right Ear: Hearing, tympanic membrane, external ear and ear canal normal.  Left Ear: Hearing, tympanic membrane, external ear and ear canal normal.  Nose: Nose normal. No mucosal edema or rhinorrhea. Right sinus exhibits no maxillary sinus tenderness and no frontal sinus tenderness. Left sinus exhibits no maxillary sinus tenderness and no frontal sinus tenderness.  Mouth/Throat: Uvula is midline and mucous membranes are normal. Posterior oropharyngeal erythema present. No oropharyngeal exudate, posterior oropharyngeal edema or tonsillar abscesses.  Eyes: Conjunctivae normal and EOM are  normal. Pupils are equal, round, and reactive to light. No scleral icterus.  Neck: Normal range of motion. Neck supple.  Cardiovascular: Regular rhythm and intact distal pulses.   Murmur (faint mid systolic M best at apex) heard.      Slightly irregular  Pulmonary/Chest: Effort normal and breath sounds normal. No respiratory distress. He has no wheezes. He has no rales.  Lymphadenopathy:    He has no cervical adenopathy.  Skin: Skin is warm and dry. No rash noted.       Assessment & Plan:

## 2012-08-01 NOTE — Assessment & Plan Note (Signed)
No red flags today. Anticipate post infectious cough after viral URI. Supportive care as per instructions. Red flags to return or update me discussed.

## 2012-08-01 NOTE — Patient Instructions (Addendum)
I think you had a post infectious cough. Use medication as prescribed: hycodan for night time cough (wife's). Push fluids and plenty of rest. May use aleve as needed. Please return if you are not improving as expected, or if you have high fevers (>101.5) or difficulty swallowing or worsening productive cough. Call clinic with questions.  Good to see you today.

## 2012-08-11 ENCOUNTER — Other Ambulatory Visit: Payer: Self-pay | Admitting: Family Medicine

## 2012-08-11 MED ORDER — VITAMIN D 50 MCG (2000 UT) PO TABS: 2000.0000 [IU] | ORAL_TABLET | Freq: Every day | ORAL | Status: DC

## 2012-08-11 NOTE — Telephone Encounter (Signed)
Received request to refill vit D 50,000 IU weekly.  He has completed several months of this course - I'd like him to start taking over the counter vitamin D 2000 IU daily, this should suffice.

## 2012-08-11 NOTE — Telephone Encounter (Signed)
Patient notified

## 2012-09-01 ENCOUNTER — Other Ambulatory Visit: Payer: Self-pay | Admitting: Family Medicine

## 2012-09-01 DIAGNOSIS — I10 Essential (primary) hypertension: Secondary | ICD-10-CM

## 2012-09-01 DIAGNOSIS — E1165 Type 2 diabetes mellitus with hyperglycemia: Secondary | ICD-10-CM

## 2012-09-01 DIAGNOSIS — E785 Hyperlipidemia, unspecified: Secondary | ICD-10-CM

## 2012-09-01 DIAGNOSIS — E349 Endocrine disorder, unspecified: Secondary | ICD-10-CM

## 2012-09-01 DIAGNOSIS — N400 Enlarged prostate without lower urinary tract symptoms: Secondary | ICD-10-CM

## 2012-09-01 DIAGNOSIS — E559 Vitamin D deficiency, unspecified: Secondary | ICD-10-CM

## 2012-09-02 ENCOUNTER — Other Ambulatory Visit (INDEPENDENT_AMBULATORY_CARE_PROVIDER_SITE_OTHER): Payer: Medicare Other

## 2012-09-02 DIAGNOSIS — E291 Testicular hypofunction: Secondary | ICD-10-CM

## 2012-09-02 DIAGNOSIS — I10 Essential (primary) hypertension: Secondary | ICD-10-CM

## 2012-09-02 DIAGNOSIS — N400 Enlarged prostate without lower urinary tract symptoms: Secondary | ICD-10-CM

## 2012-09-02 DIAGNOSIS — E349 Endocrine disorder, unspecified: Secondary | ICD-10-CM

## 2012-09-02 DIAGNOSIS — E785 Hyperlipidemia, unspecified: Secondary | ICD-10-CM

## 2012-09-02 DIAGNOSIS — E1165 Type 2 diabetes mellitus with hyperglycemia: Secondary | ICD-10-CM

## 2012-09-02 LAB — CBC WITH DIFFERENTIAL/PLATELET
Basophils Absolute: 0 10*3/uL (ref 0.0–0.1)
Eosinophils Absolute: 0.1 10*3/uL (ref 0.0–0.7)
HCT: 43.8 % (ref 39.0–52.0)
Hemoglobin: 14.7 g/dL (ref 13.0–17.0)
Lymphs Abs: 1.5 10*3/uL (ref 0.7–4.0)
MCHC: 33.5 g/dL (ref 30.0–36.0)
MCV: 91.1 fl (ref 78.0–100.0)
Neutro Abs: 5.5 10*3/uL (ref 1.4–7.7)
RDW: 13.2 % (ref 11.5–14.6)

## 2012-09-02 LAB — PSA: PSA: 1.82 ng/mL (ref 0.10–4.00)

## 2012-09-02 LAB — COMPREHENSIVE METABOLIC PANEL
AST: 16 U/L (ref 0–37)
BUN: 16 mg/dL (ref 6–23)
CO2: 30 mEq/L (ref 19–32)
Calcium: 9.5 mg/dL (ref 8.4–10.5)
Chloride: 101 mEq/L (ref 96–112)
Creatinine, Ser: 0.6 mg/dL (ref 0.4–1.5)
GFR: 142.56 mL/min (ref >60.00)
Total Bilirubin: 0.7 mg/dL (ref 0.3–1.2)

## 2012-09-02 LAB — LIPID PANEL
Cholesterol: 122 mg/dL (ref 0–200)
Triglycerides: 97 mg/dL (ref 0.0–149.0)

## 2012-09-09 ENCOUNTER — Encounter: Payer: 59 | Admitting: Family Medicine

## 2012-09-10 ENCOUNTER — Ambulatory Visit (INDEPENDENT_AMBULATORY_CARE_PROVIDER_SITE_OTHER): Payer: Medicare Other | Admitting: Family Medicine

## 2012-09-10 ENCOUNTER — Encounter: Payer: Self-pay | Admitting: Family Medicine

## 2012-09-10 VITALS — BP 122/70 | HR 88 | Temp 98.1°F | Ht 69.0 in | Wt 156.8 lb

## 2012-09-10 DIAGNOSIS — E349 Endocrine disorder, unspecified: Secondary | ICD-10-CM

## 2012-09-10 DIAGNOSIS — Z1211 Encounter for screening for malignant neoplasm of colon: Secondary | ICD-10-CM

## 2012-09-10 DIAGNOSIS — I10 Essential (primary) hypertension: Secondary | ICD-10-CM

## 2012-09-10 DIAGNOSIS — E1165 Type 2 diabetes mellitus with hyperglycemia: Secondary | ICD-10-CM

## 2012-09-10 DIAGNOSIS — M810 Age-related osteoporosis without current pathological fracture: Secondary | ICD-10-CM

## 2012-09-10 DIAGNOSIS — N400 Enlarged prostate without lower urinary tract symptoms: Secondary | ICD-10-CM

## 2012-09-10 DIAGNOSIS — E291 Testicular hypofunction: Secondary | ICD-10-CM

## 2012-09-10 DIAGNOSIS — E785 Hyperlipidemia, unspecified: Secondary | ICD-10-CM

## 2012-09-10 DIAGNOSIS — I2510 Atherosclerotic heart disease of native coronary artery without angina pectoris: Secondary | ICD-10-CM

## 2012-09-10 DIAGNOSIS — Z0000 Encounter for general adult medical examination without abnormal findings: Secondary | ICD-10-CM | POA: Insufficient documentation

## 2012-09-10 LAB — HEMOCCULT GUIAC POC 1CARD (OFFICE)

## 2012-09-10 MED ORDER — METFORMIN HCL 1000 MG PO TABS: 1000.0000 mg | ORAL_TABLET | Freq: Two times a day (BID) | ORAL | Status: DC

## 2012-09-10 MED ORDER — INSULIN GLARGINE 100 UNIT/ML ~~LOC~~ SOLN: 20.0000 [IU] | SUBCUTANEOUS | Status: DC

## 2012-09-10 MED ORDER — CARVEDILOL 3.125 MG PO TABS: 3.1250 mg | ORAL_TABLET | Freq: Two times a day (BID) | ORAL | Status: DC

## 2012-09-10 MED ORDER — LOVASTATIN 40 MG PO TABS: 40.0000 mg | ORAL_TABLET | Freq: Every day | ORAL | Status: DC

## 2012-09-10 MED ORDER — RAMIPRIL 5 MG PO CAPS: 5.0000 mg | ORAL_CAPSULE | Freq: Every day | ORAL | Status: DC

## 2012-09-10 NOTE — Assessment & Plan Note (Signed)
Chronic, stable. Continue statin.  

## 2012-09-10 NOTE — Assessment & Plan Note (Signed)
Asxs. On ASA, ACEI and Beta blocker.

## 2012-09-10 NOTE — Assessment & Plan Note (Signed)
Remains uncontrolled. Will increase metformin to 1000mg  bid. Continue insulin. Discussed watching for low sugars.

## 2012-09-10 NOTE — Patient Instructions (Signed)
Call your insurance about the shingles shot to see if it is covered or how much it would cost and where is cheaper (here or pharmacy).  If you want to receive here, call for nurse visit. Pass by Marion's office for screening colonoscopy Let's stop testosterone - we will reassess things after next bone scan. Let's increase metformin to 1 pill in am and 2 pills at night for 2 weeks then increase to 2 pills twice a day. Good to see you today, call us with questions Return in 4 months for follow up diabetes.

## 2012-09-10 NOTE — Assessment & Plan Note (Signed)
Normal LH levels.  ?secondary however normal thyroid and prolactin. Has not noticed any change - and concerned with cost. Desires to stop. Will stop, reassess at next bone density scan.

## 2012-09-10 NOTE — Assessment & Plan Note (Signed)
Stable PSA and exam.  Did not start rapaflo - doesn't feel needs this.

## 2012-09-10 NOTE — Assessment & Plan Note (Signed)
forteo started 07/2011 - continue for 2 years. Check dexa 06/2013. Desires to stop test supplementation.

## 2012-09-10 NOTE — Progress Notes (Signed)
Subjective:    Patient ID: Norman Marks, male    DOB: 03-12-1946, 67 y.o.   MRN: [id number]  HPI CC: medicare wellness visit  hypotestosterone - on axiron 2 actuations daily.  Doesn't feel helping, concerned with cost.  Desires to stop. BPH - stopped rapaflo - didn't think needed. DM - UTD foot and eye exam (04/2012).  Recent increase in lantus to 20 units in am.  Sugars running 140-200 fasting.  Checks about daily. Osteoporosis - forteo was started 07/2011.  Last dexa 06/2011 with T score of -2.6.  Due for rpt 06/2013.  Passes hearing and vision screens today. No falls No depression/anhedonia.  Caffeine: 1 cup coffee/day Lives with wife, 1 dog, 5 cats. Occupation: retired Edu: 12th grade Activity: no regular exercise Diet: good water, fruits/vegetables daily  Preventative:  Last CPE was early 2013, unsure when exactly. Colon cancer screening - would like colonoscopy. Had flex sig by [facility] around 2000s (no records) Prostate cancer screening - to do today penumovax 06/28/2010 Flu 04/2012 Tetanus around 2011, pt thinks zostavax - discussed Advanced directive: knows what they are but doesn't have one set up.  Was father's POA so aware of this.  Has talked about this with wife.  Medications and allergies reviewed and updated in chart.  Past histories reviewed and updated if relevant as below. Patient Active Problem List  Diagnosis  . Diabetes type 2, uncontrolled  . Hx of migraines  . BPH (benign prostatic hypertrophy)  . HTN (hypertension)  . HLD (hyperlipidemia)  . CAD (coronary artery disease)  . Osteoporosis  . Vitamin d deficiency  . Hypotestosteronism  . Cough   Past Medical History  Diagnosis Date  . History of chicken pox   . Diabetes type 2, uncontrolled   . Hx of migraines   . BPH (benign prostatic hypertrophy)   . HTN (hypertension)     mild  . HLD (hyperlipidemia)   . CAD (coronary artery disease) 2009    [name] (cardiolite 2009 with 70%  stenosis of proximal ramus, not amenable to PCI, rec treat medically)  . Osteoporosis 06/2011    T score -3  . History of nephrolithiasis 2012    L ureter, R renal pelvis  . DDD (degenerative disc disease), lumbar     s/p surgery  . Vitamin D deficiency   . History of bundle branch block    Past Surgical History  Procedure Laterality Date  . Back surgery  1994, 2008    lumbar; x 3, spinal fusion  . Knee surgery      Right  . Shoulder surgery      Right  . US echocardiography  10/2008    nl LV fxn, EF 50%, mild MR, mild pulm HTN  . Skin surgery  2006    skin cancer  . Cardiovascular stress test  2011    fixed defect inferior/septal walls without reversibility, LVEF 43% post stress  . Cardiac catheterization  2009    70% stenosis ostium  . Dexa  06/2011    osteoporosis T -2.6 spine, -1.9 hip  . Flexible sigmoidoscopy  2007    pt reported normal [facility])   History  Substance Use Topics  . Smoking status: Former Smoker    Types: Cigarettes  . Smokeless tobacco: Former Neurosurgeon    Types: Chew  . Alcohol Use: No   Family History  Problem Relation Age of Onset  . Cancer Father     lung and prostate  . Heart disease  Mother   . Cancer Mother     unsure  . Coronary artery disease Sister 10  . Diabetes Father   . Diabetes Brother   . Stroke Neg Hx    Allergies  Allergen Reactions  . Percocet (Oxycodone-Acetaminophen) Itching   Current Outpatient Prescriptions on File Prior to Visit  Medication Sig Dispense Refill  . aspirin 81 MG tablet Take 81 mg by mouth daily.      . Calcium Carb-Cholecalciferol (CALCIUM-VITAMIN D) 600-400 MG-UNIT TABS Take 1 tablet by mouth 2 (two) times daily.      . carvedilol (COREG) 3.125 MG tablet Take 1 tablet (3.125 mg total) by mouth 2 (two) times daily with a meal.  60 tablet  3  . Cholecalciferol (VITAMIN D) 2000 UNITS tablet Take 1 tablet (2,000 Units total) by mouth daily.      Marland Kitchen FORTEO 600 MCG/2.4ML SOLN INJECT INTO THE SKIN  DAILY.  3 mL  11  . insulin glargine (LANTUS) 100 UNIT/ML injection Inject 20 Units into the skin every morning.       . Insulin Pen Needle (EXEL PEN NEEDLES 31GX1/4") 31G X 6 MM MISC Use as directed  100 each  3  . lovastatin (MEVACOR) 40 MG tablet Take 1 tablet (40 mg total) by mouth at bedtime.  30 tablet  11  . metFORMIN (GLUCOPHAGE) 500 MG tablet Take 1 tablet (500 mg total) by mouth 2 (two) times daily with a meal.  60 tablet  6  . ramipril (ALTACE) 5 MG capsule Take 1 capsule (5 mg total) by mouth daily.  30 capsule  11  . Testosterone (AXIRON) 30 MG/ACT SOLN Place 2 Act onto the skin every morning. 30mg  under each arm daily  90 mL  3   No current facility-administered medications on file prior to visit.     Review of Systems  Constitutional: Negative for fever, chills, activity change, appetite change, fatigue and unexpected weight change.  HENT: Negative for hearing loss and neck pain.   Eyes: Negative for visual disturbance.  Respiratory: Negative for cough, chest tightness, shortness of breath and wheezing.   Cardiovascular: Negative for chest pain, palpitations and leg swelling.  Gastrointestinal: Negative for nausea, vomiting, abdominal pain, diarrhea, constipation, blood in stool and abdominal distention.  Genitourinary: Negative for hematuria and difficulty urinating.  Musculoskeletal: Negative for myalgias and arthralgias.  Skin: Negative for rash.  Neurological: Negative for dizziness, seizures, syncope and headaches.  Hematological: Does not bruise/bleed easily (mild).  Psychiatric/Behavioral: Negative for dysphoric mood. The patient is not nervous/anxious.        Objective:   Physical Exam  Nursing note and vitals reviewed. Constitutional: He is oriented to person, place, and time. He appears well-developed and well-nourished. No distress.  HENT:  Head: Normocephalic and atraumatic.  Right Ear: Hearing, tympanic membrane, external ear and ear canal normal.  Left  Ear: Hearing, tympanic membrane, external ear and ear canal normal.  Nose: Nose normal.  Mouth/Throat: Oropharynx is clear and moist. No oropharyngeal exudate.  Eyes: Conjunctivae and EOM are normal. Pupils are equal, round, and reactive to light. No scleral icterus.  Neck: Normal range of motion. Neck supple. Carotid bruit is not present. No thyromegaly present.  Cardiovascular: Normal rate, regular rhythm, normal heart sounds and intact distal pulses.   No murmur heard. Pulses:      Radial pulses are 2+ on the right side, and 2+ on the left side.  Pulmonary/Chest: Effort normal and breath sounds normal. No respiratory distress.  He has no wheezes. He has no rales.  Abdominal: Soft. Bowel sounds are normal. He exhibits no distension and no mass. There is no tenderness. There is no rebound and no guarding.  Genitourinary: Rectum normal. Rectal exam shows no external hemorrhoid, no internal hemorrhoid, no fissure, no mass, no tenderness and anal tone normal. Guaiac negative stool. Prostate is enlarged (30gm). Prostate is not tender.  Musculoskeletal: Normal range of motion. He exhibits no edema.  Lymphadenopathy:    He has no cervical adenopathy.  Neurological: He is alert and oriented to person, place, and time.  CN grossly intact, station and gait intact  Skin: Skin is warm and dry. No rash noted.  Psychiatric: He has a normal mood and affect. His behavior is normal. Judgment and thought content normal.       Assessment & Plan:

## 2012-09-10 NOTE — Assessment & Plan Note (Signed)
I have personally reviewed the Medicare Annual Wellness questionnaire and have noted 1. The patient's medical and social history 2. Their use of alcohol, tobacco or illicit drugs 3. Their current medications and supplements 4. The patient's functional ability including ADL's, fall risks, home safety risks and hearing or visual impairment. 5. Diet and physical activity 6. Evidence for depression or mood disorders The patients weight, height, BMI have been recorded in the chart.  Hearing and vision has been addressed. I have made referrals, counseling and provided education to the patient based review of the above and I have provided the pt with a written personalized care plan for preventive services. See scanned questionairre. Advanced directives discussed: aware of what this is.  Has not established.  Will consider and discuss at f/u visit.  Reviewed preventative protocols and updated unless pt declined. Discussed zostavax. Requests colonsocopy - will schedule Reassuring DRE/PSA - h/o BPH.

## 2012-09-10 NOTE — Assessment & Plan Note (Signed)
Chronic, stable on current regimen. Continue.  

## 2012-09-22 ENCOUNTER — Other Ambulatory Visit: Payer: Medicare Other

## 2012-09-23 ENCOUNTER — Other Ambulatory Visit: Payer: Self-pay

## 2012-09-23 MED ORDER — GLUCOSE BLOOD VI STRP: ORAL_STRIP | Status: DC

## 2012-09-23 NOTE — Telephone Encounter (Signed)
Norman Marks left v/m requesting test strips to [facility] [address].Marland Kitchen Spoke with pt; Contour test strips, checking 1-2 times daily. Pt request 90 day supply.pt notified done.

## 2012-10-14 DIAGNOSIS — Z860101 Personal history of adenomatous and serrated colon polyps: Secondary | ICD-10-CM

## 2012-10-14 DIAGNOSIS — Z86010 Personal history of colonic polyps: Secondary | ICD-10-CM

## 2012-10-14 HISTORY — DX: Personal history of colonic polyps: Z86.010

## 2012-10-14 HISTORY — PX: COLONOSCOPY: SHX174

## 2012-10-14 HISTORY — DX: Personal history of adenomatous and serrated colon polyps: Z86.0101

## 2012-10-20 ENCOUNTER — Ambulatory Visit (AMBULATORY_SURGERY_CENTER): Payer: Medicare Other | Admitting: *Deleted

## 2012-10-20 ENCOUNTER — Encounter: Payer: Self-pay | Admitting: Gastroenterology

## 2012-10-20 VITALS — Ht 69.0 in | Wt 158.8 lb

## 2012-10-20 DIAGNOSIS — Z1211 Encounter for screening for malignant neoplasm of colon: Secondary | ICD-10-CM

## 2012-10-20 MED ORDER — NA SULFATE-K SULFATE-MG SULF 17.5-3.13-1.6 GM/177ML PO SOLN: ORAL | Status: DC

## 2012-10-28 ENCOUNTER — Encounter: Payer: Self-pay | Admitting: Gastroenterology

## 2012-10-28 ENCOUNTER — Ambulatory Visit (AMBULATORY_SURGERY_CENTER): Payer: Medicare Other | Admitting: Gastroenterology

## 2012-10-28 ENCOUNTER — Other Ambulatory Visit: Payer: Self-pay | Admitting: Gastroenterology

## 2012-10-28 VITALS — BP 126/78 | HR 88 | Temp 97.6°F | Resp 19 | Ht 69.0 in | Wt 158.0 lb

## 2012-10-28 DIAGNOSIS — D126 Benign neoplasm of colon, unspecified: Secondary | ICD-10-CM

## 2012-10-28 DIAGNOSIS — Z1211 Encounter for screening for malignant neoplasm of colon: Secondary | ICD-10-CM

## 2012-10-28 LAB — GLUCOSE, CAPILLARY
Glucose-Capillary: 138 mg/dL — ABNORMAL HIGH (ref 70–99)
Glucose-Capillary: 132 mg/dL — ABNORMAL HIGH (ref 70–99)

## 2012-10-28 MED ORDER — SODIUM CHLORIDE 0.9 % IV SOLN: 500.0000 mL | INTRAVENOUS | Status: DC

## 2012-10-28 NOTE — Patient Instructions (Addendum)

## 2012-10-28 NOTE — Progress Notes (Signed)
Called to room to assist during endoscopic procedure.  Patient ID and intended procedure confirmed with present staff. Received instructions for my participation in the procedure from the performing physician. ewm 

## 2012-10-28 NOTE — Progress Notes (Signed)
Patient did not experience any of the following events: a burn prior to discharge; a fall within the facility; wrong site/side/patient/procedure/implant event; or a hospital transfer or hospital admission upon discharge from the facility. (G8907) Patient did not have preoperative order for IV antibiotic SSI prophylaxis. (G8918)  

## 2012-10-28 NOTE — Op Note (Signed)
[facility] [address] [place], [address]   COLONOSCOPY PROCEDURE REPORT  PATIENT: Norman Marks, Norman Marks  MR#: [id number] BIRTHDATE: 1945/10/04 , 66  yrs. old GENDER: Male ENDOSCOPIST: Louis Meckel, MD REFERRED AO:[id number] Sharen Hones, M.D. PROCEDURE DATE:  10/28/2012 PROCEDURE:   Colonoscopy with snare polypectomy ASA CLASS:   Class II INDICATIONS:average risk screening. MEDICATIONS: MAC sedation, administered by CRNA and propofol (Diprivan) 200mg  IV  DESCRIPTION OF PROCEDURE:   After the risks benefits and alternatives of the procedure were thoroughly explained, informed consent was obtained.  A digital rectal exam revealed no abnormalities of the rectum.   The LB [id number]  endoscope was introduced through the anus and advanced to the cecum, which was identified by both the appendix and ileocecal valve. No adverse events experienced.   The quality of the prep was Suprep excellent The instrument was then slowly withdrawn as the colon was fully examined.      COLON FINDINGS: A sessile polyp was found in the descending colon. A polypectomy was performed with a cold snare.  The resection was complete and the polyp tissue was completely retrieved.   Mild diverticulosis was noted in the sigmoid colon.   The colon mucosa was otherwise normal.  Retroflexed views revealed no abnormalities. The time to cecum=3 minutes 17 seconds.  Withdrawal time=8 minutes 56 seconds.  The scope was withdrawn and the procedure completed. COMPLICATIONS: There were no complications.  ENDOSCOPIC IMPRESSION: 1.   Sessile polyp was found in the descending colon; polypectomy was performed with a cold snare 2.   Mild diverticulosis was noted in the sigmoid colon 3.   The colon mucosa was otherwise normal  RECOMMENDATIONS: If the polyp(s) removed today are proven to be adenomatous (pre-cancerous) polyps, you will need a repeat colonoscopy in 5 years.  Otherwise you should  continue to follow colorectal cancer screening guidelines for "routine risk" patients with colonoscopy in 10 years.  You will receive a letter within 1-2 weeks with the results of your biopsy as well as final recommendations.  Please call my office if you have not received a letter after 3 weeks.   eSigned:  Louis Meckel, MD 10/28/2012 10:35 AM   cc:   PATIENT NAME:  Norman Marks, Norman Marks MR#: [id number]

## 2012-10-29 ENCOUNTER — Telehealth: Payer: Self-pay

## 2012-10-29 NOTE — Telephone Encounter (Signed)
  Follow up Call-  Call back number 10/28/2012  Post procedure Call Back phone  # [phone]  Permission to leave phone message Yes     Patient questions:  Do you have a fever, pain , or abdominal swelling? no Pain Score  0 *  Have you tolerated food without any problems? yes  Have you been able to return to your normal activities? yes  Do you have any questions about your discharge instructions: Diet   no Medications  no Follow up visit  no  Do you have questions or concerns about your Care? no  Actions: * If pain score is 4 or above: No action needed, pain <4.

## 2012-11-04 ENCOUNTER — Encounter: Payer: Self-pay | Admitting: Family Medicine

## 2012-11-07 ENCOUNTER — Encounter: Payer: Self-pay | Admitting: Gastroenterology

## 2012-11-20 ENCOUNTER — Telehealth: Payer: Self-pay | Admitting: *Deleted

## 2012-11-20 NOTE — Telephone Encounter (Signed)
Form for Forteo delivery device in your IN box for completion.

## 2012-11-20 NOTE — Telephone Encounter (Signed)
Filled and placed in my out box. 

## 2013-01-08 ENCOUNTER — Ambulatory Visit: Payer: Medicare Other | Admitting: Family Medicine

## 2013-01-13 ENCOUNTER — Encounter: Payer: Self-pay | Admitting: Family Medicine

## 2013-01-13 ENCOUNTER — Ambulatory Visit (INDEPENDENT_AMBULATORY_CARE_PROVIDER_SITE_OTHER): Payer: Medicare Other | Admitting: Family Medicine

## 2013-01-13 VITALS — BP 134/80 | HR 88 | Temp 98.0°F | Wt 152.8 lb

## 2013-01-13 DIAGNOSIS — Z2911 Encounter for prophylactic immunotherapy for respiratory syncytial virus (RSV): Secondary | ICD-10-CM

## 2013-01-13 DIAGNOSIS — E1165 Type 2 diabetes mellitus with hyperglycemia: Secondary | ICD-10-CM

## 2013-01-13 LAB — BASIC METABOLIC PANEL
BUN: 16 mg/dL (ref 6–23)
CO2: 29 mEq/L (ref 19–32)
Chloride: 102 mEq/L (ref 96–112)
Potassium: 4.2 mEq/L (ref 3.5–5.1)

## 2013-01-13 NOTE — Assessment & Plan Note (Signed)
Anticipate improved control with increase metformin. Continue meds. One low sugar, none since. Continue to monitor closely. rtc 4 mo for DM f/u.

## 2013-01-13 NOTE — Progress Notes (Signed)
  Subjective:    Patient ID: Norman Marks, male    DOB: Jul 28, 1945, 67 y.o.   MRN: [id number]  HPI CC: f/u DM  DM - UTD foot and eye exam (04/2012). Recent increase in lantus to 20 units in am. compliant with metformin 1000mg  bid.  Initially with GI upset.  Sugars running abou 100-110.  Checks about daily.  Had shot of cortisone which raised sugars.  Pneumovax 06/2010.  Zostavax today.  One low sugar to 50s, none since.  Does feel low sugars.  Drinks fruit juice when runs low.  No paresthesias.  Osteoporosis - forteo was started 07/2011. Last dexa 06/2011 with T score of -2.6. Due for rpt 06/2013.  Testosterone was stopped 2/2 cost concerns.  Will reassess next DEXA.   Past Medical History  Diagnosis Date  . History of chicken pox   . Diabetes type 2, uncontrolled   . Hx of migraines   . BPH (benign prostatic hypertrophy)   . HTN (hypertension)     mild  . HLD (hyperlipidemia)   . CAD (coronary artery disease) 2009    [name] (cardiolite 2009 with 70% stenosis of proximal ramus, not amenable to PCI, rec treat medically)  . Osteoporosis 06/2011    T score -3  . History of nephrolithiasis 2012    L ureter, R renal pelvis  . DDD (degenerative disc disease), lumbar     s/p surgery  . Vitamin D deficiency   . History of bundle branch block   . History of adenomatous polyp of colon 10/2012     Review of Systems Per HPI    Objective:   Physical Exam  Nursing note and vitals reviewed. Constitutional: He appears well-developed and well-nourished. No distress.  HENT:  Head: Normocephalic and atraumatic.  Right Ear: External ear normal.  Left Ear: External ear normal.  Nose: Nose normal.  Mouth/Throat: Oropharynx is clear and moist. No oropharyngeal exudate.  Eyes: Conjunctivae and EOM are normal. Pupils are equal, round, and reactive to light. No scleral icterus.  Neck: Normal range of motion. Neck supple.  Cardiovascular: Normal rate, regular rhythm, normal heart sounds and  intact distal pulses.   No murmur heard. Pulmonary/Chest: Effort normal and breath sounds normal. No respiratory distress. He has no wheezes. He has no rales.  Musculoskeletal: He exhibits no edema.  Diabetic foot exam: Normal inspection No skin breakdown No calluses  Normal DP/PT pulses Normal sensation to light touch and monofilament Nails normal  Lymphadenopathy:    He has no cervical adenopathy.  Skin: Skin is warm and dry. No rash noted.  Psychiatric: He has a normal mood and affect.       Assessment & Plan:

## 2013-01-13 NOTE — Addendum Note (Signed)
Addended by: Josph Macho A on: 01/13/2013 09:51 AM   Modules accepted: Orders

## 2013-01-13 NOTE — Patient Instructions (Addendum)
Shingles shot today. Blood work today. Return in 4 months for follow up. Good to see you, call [place] with questions. Happy Birthday!

## 2013-01-14 ENCOUNTER — Encounter: Payer: Self-pay | Admitting: *Deleted

## 2013-02-11 ENCOUNTER — Ambulatory Visit (INDEPENDENT_AMBULATORY_CARE_PROVIDER_SITE_OTHER): Payer: Medicare Other | Admitting: Family Medicine

## 2013-02-11 ENCOUNTER — Encounter: Payer: Self-pay | Admitting: Family Medicine

## 2013-02-11 VITALS — BP 114/64 | HR 88 | Temp 97.8°F | Ht 69.0 in | Wt 149.8 lb

## 2013-02-11 DIAGNOSIS — R109 Unspecified abdominal pain: Secondary | ICD-10-CM

## 2013-02-11 LAB — COMPREHENSIVE METABOLIC PANEL
ALT: 64 U/L — ABNORMAL HIGH (ref 0–53)
BUN: 16 mg/dL (ref 6–23)
CO2: 28 mEq/L (ref 19–32)
Calcium: 10.3 mg/dL (ref 8.4–10.5)
Chloride: 104 mEq/L (ref 96–112)
Creatinine, Ser: 0.7 mg/dL (ref 0.4–1.5)
GFR: 119.17 mL/min (ref >60.00)

## 2013-02-11 LAB — CBC WITH DIFFERENTIAL/PLATELET
Basophils Absolute: 0 10*3/uL (ref 0.0–0.1)
Hemoglobin: 13.2 g/dL (ref 13.0–17.0)
Lymphocytes Relative: 11.5 % — ABNORMAL LOW (ref 12.0–46.0)
Monocytes Relative: 6.4 % (ref 3.0–12.0)
Neutro Abs: 5.9 10*3/uL (ref 1.4–7.7)
RDW: 12.9 % (ref 11.5–14.6)

## 2013-02-11 LAB — LIPASE: Lipase: 25 U/L (ref 11.0–59.0)

## 2013-02-11 MED ORDER — AMOXICILLIN-POT CLAVULANATE 875-125 MG PO TABS: 1.0000 | ORAL_TABLET | Freq: Two times a day (BID) | ORAL | Status: AC

## 2013-02-11 NOTE — Patient Instructions (Signed)
I think you may have diverticulitis - treat with bowel rest (clear liquid diet for 1-2 days) and augmentin course. Blood work today. If not better or any worsening, let me know for further evaluation with imaging study. Let's monitor weight - if continues decreasing, let me know.  Diverticulitis A diverticulum is a small pouch or sac on the colon. Diverticulosis is the presence of these diverticula on the colon. Diverticulitis is the irritation (inflammation) or infection of diverticula. CAUSES  The colon and its diverticula contain bacteria. If food particles block the tiny opening to a diverticulum, the bacteria inside can grow and cause an increase in pressure. This leads to infection and inflammation and is called diverticulitis. SYMPTOMS   Abdominal pain and tenderness. Usually, the pain is located on the left side of your abdomen. However, it could be located elsewhere.  Fever.  Bloating.  Feeling sick to your stomach (nausea).  Throwing up (vomiting).  Abnormal stools. DIAGNOSIS  Your caregiver will take a history and perform a physical exam. Since many things can cause abdominal pain, other tests may be necessary. Tests may include:  Blood tests.  Urine tests.  X-ray of the abdomen.  CT scan of the abdomen. Sometimes, surgery is needed to determine if diverticulitis or other conditions are causing your symptoms. TREATMENT  Most of the time, you can be treated without surgery. Treatment includes:  Resting the bowels by only having liquids for a few days. As you improve, you will need to eat a low-fiber diet.  Intravenous (IV) fluids if you are losing body fluids (dehydrated).  Antibiotic medicines that treat infections may be given.  Pain and nausea medicine, if needed.  Surgery if the inflamed diverticulum has burst. HOME CARE INSTRUCTIONS   Try a clear liquid diet (broth, tea, or water for as long as directed by your caregiver). You may then gradually begin a  low-fiber diet as tolerated.  A low-fiber diet is a diet with less than 10 grams of fiber. Choose the foods below to reduce fiber in the diet:  White breads, cereals, rice, and pasta.  Cooked fruits and vegetables or soft fresh fruits and vegetables without the skin.  Ground or well-cooked tender beef, ham, veal, lamb, pork, or poultry.  Eggs and seafood.  After your diverticulitis symptoms have improved, your caregiver may put you on a high-fiber diet. A high-fiber diet includes 14 grams of fiber for every 1000 calories consumed. For a standard 2000 calorie diet, you would need 28 grams of fiber. Follow these diet guidelines to help you increase the fiber in your diet. It is important to slowly increase the amount fiber in your diet to avoid gas, constipation, and bloating.  Choose whole-grain breads, cereals, pasta, and brown rice.  Choose fresh fruits and vegetables with the skin on. Do not overcook vegetables because the more vegetables are cooked, the more fiber is lost.  Choose more nuts, seeds, legumes, dried peas, beans, and lentils.  Look for food products that have greater than 3 grams of fiber per serving on the Nutrition Facts label.  Take all medicine as directed by your caregiver.  If your caregiver has given you a follow-up appointment, it is very important that you go. Not going could result in lasting (chronic) or permanent injury, pain, and disability. If there is any problem keeping the appointment, call to reschedule. SEEK MEDICAL CARE IF:   Your pain does not improve.  You have a hard time advancing your diet beyond clear liquids.  Your bowel movements do not return to normal. SEEK IMMEDIATE MEDICAL CARE IF:   Your pain becomes worse.  You have an oral temperature above 102 F (38.9 C), not controlled by medicine.  You have repeated vomiting.  You have bloody or black, tarry stools.  Symptoms that brought you to your caregiver become worse or are not  getting better. MAKE SURE YOU:   Understand these instructions.  Will watch your condition.  Will get help right away if you are not doing well or get worse. Document Released: 04/11/2005 Document Revised: 09/24/2011 Document Reviewed: 08/07/2010 [facility] Patient Information 2014 [place], [place].

## 2013-02-11 NOTE — Assessment & Plan Note (Addendum)
With chills, BM change, and LLQ pain on exam, in h/o mild diverticulosis on recent colonosocpy (10/2012) Anticipate mild case of diverticulitis. Treat with augmentin and bowel rest. If not improving, notify me for further evaluation. Blood work today (CBC, CMP, lipase). Handout provided. Monitor weight loss.

## 2013-02-11 NOTE — Progress Notes (Signed)
Subjective:    Patient ID: Norman Marks, male    DOB: 1946/03/26, 67 y.o.   MRN: [id number]  HPI CC: GI issue  Pleasant 67 yo with h/o T2DM, BPH, HTN, HLD and CAD and osteoporosis presents with GI concerns.  Not feeling well over the last several months.  Was wondering if due to metformin.  Has cut back on metformin to one pill daily for the last 3-4 weeks.  This may have helped for a bit but continues to have abd discomfort which worsened over the last four days.  Generalized malaise, abd discomfort described as sharp cramping associated with nausea and indigestion.  Starts in upper abdomen, but can progress to lower abdomen.  Some lower back pain as well.  Some loose stools.  Has not had good regular BM.  Last night felt chills and night sweats, not due to low sugars.  Appetite down, weight down.  Denies GERD sxs. No nausea/vomiting, constipation, blood or mucous in stool.  No urinary changes recently (nocturia one night bit now better). No sick contacts at home.  No new foods or travel. Uses well water. No prior h/o GI issues.  COLONOSCOPY Date: 10/2012 tubular adenoma, mild diverticulosis, rpt 5 yrs Arlyce Dice).  Wt Readings from Last 3 Encounters:  02/11/13 149 lb 12 oz (67.926 kg)  01/13/13 152 lb 12 oz (69.287 kg)  10/28/12 158 lb (71.668 kg)    Past Medical History  Diagnosis Date  . History of chicken pox   . Diabetes type 2, uncontrolled   . Hx of migraines   . BPH (benign prostatic hypertrophy)   . HTN (hypertension)     mild  . HLD (hyperlipidemia)   . CAD (coronary artery disease) 2009    [name] (cardiolite 2009 with 70% stenosis of proximal ramus, not amenable to PCI, rec treat medically)  . Osteoporosis 06/2011    T score -3  . History of nephrolithiasis 2012    L ureter, R renal pelvis  . DDD (degenerative disc disease), lumbar     s/p surgery  . Vitamin D deficiency   . History of bundle branch block   . History of adenomatous polyp of colon 10/2012    Past  Surgical History  Procedure Laterality Date  . Back surgery  1994, 2008    lumbar; x 3, spinal fusion  . Knee surgery      Right  . Shoulder surgery      Right  . US echocardiography  10/2008    nl LV fxn, EF 50%, mild MR, mild pulm HTN  . Skin surgery  2006    skin cancer  . Cardiovascular stress test  2011    fixed defect inferior/septal walls without reversibility, LVEF 43% post stress  . Cardiac catheterization  2009    70% stenosis ostium  . Dexa  06/2011    osteoporosis T -2.6 spine, -1.9 hip  . Flexible sigmoidoscopy  2007    pt reported normal ([place])  . Colonoscopy  10/2012    tubular adenoma, mild diverticulosis, rpt 5 yrs Arlyce Dice)   Review of Systems Per HPI    Objective:   Physical Exam  Nursing note and vitals reviewed. Constitutional: He appears well-developed and well-nourished. No distress.  HENT:  Head: Normocephalic and atraumatic.  Mouth/Throat: Oropharynx is clear and moist. No oropharyngeal exudate.  Eyes: Conjunctivae and EOM are normal. Pupils are equal, round, and reactive to light.  Cardiovascular: Normal rate, regular rhythm, normal heart sounds and intact distal  pulses.   No murmur heard. Pulmonary/Chest: Effort normal and breath sounds normal. No respiratory distress. He has no wheezes. He has no rales.  Abdominal: Soft. Normal appearance and bowel sounds are normal. He exhibits no distension and no mass. There is no hepatosplenomegaly. There is tenderness in the suprapubic area and left lower quadrant. There is no rigidity, no rebound, no guarding, no CVA tenderness and negative Murphy's sign.  Musculoskeletal: He exhibits no edema.  Skin: Skin is warm and dry. No rash noted.       Assessment & Plan:

## 2013-02-13 ENCOUNTER — Other Ambulatory Visit: Payer: Self-pay | Admitting: Family Medicine

## 2013-02-13 DIAGNOSIS — R109 Unspecified abdominal pain: Secondary | ICD-10-CM

## 2013-02-17 ENCOUNTER — Encounter: Payer: Self-pay | Admitting: Family Medicine

## 2013-02-17 ENCOUNTER — Ambulatory Visit: Payer: Self-pay | Admitting: Family Medicine

## 2013-02-18 ENCOUNTER — Telehealth: Payer: Self-pay | Admitting: Family Medicine

## 2013-02-18 NOTE — Telephone Encounter (Signed)
plz notify ultrasound returned overall normal. Did show some possible gallbladder polyps, but shouldn't cause any left lower abdominal discomfort. How is he feeling with augmentin?  If not improving or any worsening, would consider abdominal CT to better evaluate bowels.

## 2013-02-18 NOTE — Telephone Encounter (Signed)
Spoke with patient and he states he's doing fine on the medication, he will call if anything changes.

## 2013-02-18 NOTE — Telephone Encounter (Signed)
Noted. Thanks.

## 2013-04-09 ENCOUNTER — Other Ambulatory Visit: Payer: Self-pay | Admitting: Dermatology

## 2013-04-15 LAB — HM DIABETES EYE EXAM: HM Diabetic Eye Exam: NORMAL

## 2013-05-06 ENCOUNTER — Other Ambulatory Visit: Payer: Self-pay | Admitting: *Deleted

## 2013-05-06 ENCOUNTER — Ambulatory Visit: Payer: Medicare Other

## 2013-05-06 MED ORDER — "INSULIN SYRINGE-NEEDLE U-100 31G X 5/16"" 0.3 ML MISC": Status: DC

## 2013-05-07 NOTE — Telephone Encounter (Signed)
Mrs Dunlap left v/m requesting insulin syringes. Spoke with Angelique Blonder at [facility] and syringes are ready for pick . Pt notified.

## 2013-05-18 ENCOUNTER — Encounter: Payer: Self-pay | Admitting: Family Medicine

## 2013-05-18 ENCOUNTER — Ambulatory Visit (INDEPENDENT_AMBULATORY_CARE_PROVIDER_SITE_OTHER): Payer: Medicare Other | Admitting: Family Medicine

## 2013-05-18 VITALS — BP 150/80 | HR 84 | Temp 98.3°F | Wt 155.5 lb

## 2013-05-18 DIAGNOSIS — R7401 Elevation of levels of liver transaminase levels: Secondary | ICD-10-CM | POA: Insufficient documentation

## 2013-05-18 DIAGNOSIS — I10 Essential (primary) hypertension: Secondary | ICD-10-CM

## 2013-05-18 DIAGNOSIS — IMO0002 Reserved for concepts with insufficient information to code with codable children: Secondary | ICD-10-CM

## 2013-05-18 DIAGNOSIS — M810 Age-related osteoporosis without current pathological fracture: Secondary | ICD-10-CM

## 2013-05-18 DIAGNOSIS — E1165 Type 2 diabetes mellitus with hyperglycemia: Secondary | ICD-10-CM

## 2013-05-18 DIAGNOSIS — E785 Hyperlipidemia, unspecified: Secondary | ICD-10-CM

## 2013-05-18 LAB — COMPREHENSIVE METABOLIC PANEL
ALT: 16 U/L (ref 0–53)
Albumin: 4.4 g/dL (ref 3.5–5.2)
CO2: 29 mEq/L (ref 19–32)
Calcium: 10.5 mg/dL (ref 8.4–10.5)
Chloride: 105 mEq/L (ref 96–112)
Creatinine, Ser: 0.8 mg/dL (ref 0.4–1.5)
GFR: 108.3 mL/min (ref >60.00)
Potassium: 4.4 mEq/L (ref 3.5–5.1)
Sodium: 138 mEq/L (ref 135–145)
Total Protein: 7.1 g/dL (ref 6.0–8.3)

## 2013-05-18 MED ORDER — SITAGLIPTIN PHOSPHATE 50 MG PO TABS: 50.0000 mg | ORAL_TABLET | Freq: Every day | ORAL | Status: DC

## 2013-05-18 NOTE — Assessment & Plan Note (Signed)
Will be due for dexa scan next month. I've asked him to call us to schedule next month. Continue forteo.

## 2013-05-18 NOTE — Assessment & Plan Note (Signed)
Newly found last blood work - recheck today. Denies EtOH or tylenol use. Normal abd [place] 02/2013

## 2013-05-18 NOTE — Progress Notes (Signed)
  Subjective:    Patient ID: Norman Marks, male    DOB: 1945/11/09, 67 y.o.   MRN: [id number]  HPI CC: 2mo DM f/u  GI upset - stable if only takes 1 metformin 1000mg  daily.  DM - compliant with metformin 1000mg  bid and lantus 20 u in am.   Checks sugars once daily.  Ranging 150-200.  Pneumovax 2011.  Last eye exam done 1 mo ago - no retinopathy.  Foot exam today.  No paresthesias.  No low sugars.  Tries to watch diet. Lab Results  Component Value Date   HGBA1C 7.3* 01/13/2013    HTN - on ramipril and coreg.  No HA, vision changes, CP/tightness, SOB, leg swelling.   BP Readings from Last 3 Encounters:  05/18/13 150/80  02/11/13 114/64  01/13/13 134/80    HLD - compliant with lovastatin.  No myalgias.  Transaminitis - found last blood work.  No tylenol use.  No EtOH use.  Osteoporosis - forteo was started 07/2011. Last dexa 06/2011 with T score of -2.6. Due for rpt 06/2013. Testosterone was stopped 2/2 cost concerns. Will reassess next DEXA.    Past Medical History  Diagnosis Date  . History of chicken pox   . Diabetes type 2, uncontrolled   . Hx of migraines   . BPH (benign prostatic hypertrophy)   . HTN (hypertension)     mild  . HLD (hyperlipidemia)   . CAD (coronary artery disease) 2009    [name] (cardiolite 2009 with 70% stenosis of proximal ramus, not amenable to PCI, rec treat medically)  . Osteoporosis 06/2011    T score -3  . History of nephrolithiasis 2012    L ureter, R renal pelvis  . DDD (degenerative disc disease), lumbar     s/p surgery  . Vitamin D deficiency   . History of bundle branch block   . History of adenomatous polyp of colon 10/2012     Review of Systems Per HPI     Objective:   Physical Exam  Nursing note and vitals reviewed. Constitutional: He appears well-developed and well-nourished. No distress.  HENT:  Head: Normocephalic and atraumatic.  Right Ear: External ear normal.  Left Ear: External ear normal.  Nose: Nose normal.   Mouth/Throat: Oropharynx is clear and moist. No oropharyngeal exudate.  Eyes: Conjunctivae and EOM are normal. Pupils are equal, round, and reactive to light. No scleral icterus.  Neck: Normal range of motion. Neck supple.  Cardiovascular: Normal rate, regular rhythm, normal heart sounds and intact distal pulses.   No murmur heard. Pulmonary/Chest: Effort normal and breath sounds normal. No respiratory distress. He has no wheezes. He has no rales.  Musculoskeletal: He exhibits no edema.  Diabetic foot exam: Normal inspection No skin breakdown No calluses  Normal DP/PT pulses Normal sensation to light tough and monofilament Nails normal  Lymphadenopathy:    He has no cervical adenopathy.  Skin: Skin is warm and dry. No rash noted.  Psychiatric: He has a normal mood and affect.       Assessment & Plan:

## 2013-05-18 NOTE — Assessment & Plan Note (Signed)
Chronic, continue lovastatin.

## 2013-05-18 NOTE — Patient Instructions (Signed)
Blood work today. Return in 3 months for physical, prior fasting for blood work again. Good to see you today. Let's increase lantus by 1 unit every 3 days if average sugar >150 to a max of 30 units daily. Start [place] 50mg  daily.

## 2013-05-18 NOTE — Assessment & Plan Note (Signed)
Unable to tolerate high dose of metformin.  Continue 1000mg  daily.   Discussed slow titration of lantus for goal fasting sugar <150. Start [place] 50mg  daily. Foot exam today. rtc 3 mo for AMW and DM f/u.

## 2013-05-18 NOTE — Assessment & Plan Note (Signed)
Chronic. Newly elevated today.  Pt will start monitoring at home and notify me if persistently elevated for increase in ACEI.

## 2013-05-19 ENCOUNTER — Encounter: Payer: Self-pay | Admitting: *Deleted

## 2013-06-30 ENCOUNTER — Telehealth: Payer: Self-pay

## 2013-06-30 DIAGNOSIS — M810 Age-related osteoporosis without current pathological fracture: Secondary | ICD-10-CM

## 2013-06-30 NOTE — Telephone Encounter (Signed)
Pt left v/m requesting bone density test that was discussed at 05/18/13 visit.Please advise.

## 2013-06-30 NOTE — Telephone Encounter (Signed)
plz notify this has been ordered. To expect a call from our office for referral

## 2013-07-01 NOTE — Telephone Encounter (Signed)
Message left notifying patient that referral had been placed and to await call to schedule appt.

## 2013-07-04 ENCOUNTER — Other Ambulatory Visit: Payer: Self-pay | Admitting: Family Medicine

## 2013-07-04 ENCOUNTER — Encounter: Payer: Self-pay | Admitting: Family Medicine

## 2013-07-04 MED ORDER — GLUCOSAMINE-CHONDROITIN 500-400 MG PO TABS: 1.0000 | ORAL_TABLET | Freq: Two times a day (BID) | ORAL | Status: DC

## 2013-07-16 DIAGNOSIS — M19011 Primary osteoarthritis, right shoulder: Secondary | ICD-10-CM

## 2013-07-16 HISTORY — PX: OTHER SURGICAL HISTORY: SHX169

## 2013-07-16 HISTORY — DX: Primary osteoarthritis, right shoulder: M19.011

## 2013-07-21 ENCOUNTER — Telehealth: Payer: Self-pay | Admitting: *Deleted

## 2013-07-21 NOTE — Telephone Encounter (Signed)
Noted. Agree.

## 2013-07-21 NOTE — Telephone Encounter (Signed)
Received notification that Norman Marks has been denied due to no recent DEXA. It is scheduled for 07/30/13. Will try again after results. Forms in your IN box.

## 2013-07-23 ENCOUNTER — Encounter: Payer: Self-pay | Admitting: Family Medicine

## 2013-07-26 NOTE — Telephone Encounter (Signed)
plz verify with patient - has he ever been on bisphosphonate like fosamax?  If not, insurance may require taking this med first.  Has he ever had bone fracture due to minimal trauma?

## 2013-07-28 NOTE — Telephone Encounter (Signed)
Spoke with patient. He has never taken any other medication for osteoporosis. He was under the impression that he was only supposed to be on the Forteo for 2 years and he has taken it for the 2 years. He has only had 2 fractures- a hip fracture due to a cow knocking him down and a wrist fracture due to a motor crank kicking back. He would like to see what the DEXA shows and if you suggest another med or continuing the Forteo, he will.

## 2013-07-28 NOTE — Telephone Encounter (Signed)
Noted. Will await dexa

## 2013-07-30 ENCOUNTER — Ambulatory Visit (INDEPENDENT_AMBULATORY_CARE_PROVIDER_SITE_OTHER)
Admission: RE | Admit: 2013-07-30 | Discharge: 2013-07-30 | Disposition: A | Discharge status: Home / Self Care | Payer: Medicare Other | Source: Ambulatory Visit | Attending: Family Medicine | Admitting: Family Medicine

## 2013-07-30 DIAGNOSIS — M810 Age-related osteoporosis without current pathological fracture: Secondary | ICD-10-CM

## 2013-09-07 ENCOUNTER — Other Ambulatory Visit: Payer: Self-pay | Admitting: Family Medicine

## 2013-09-07 ENCOUNTER — Other Ambulatory Visit (INDEPENDENT_AMBULATORY_CARE_PROVIDER_SITE_OTHER): Payer: Medicare Other

## 2013-09-07 ENCOUNTER — Encounter: Payer: Self-pay | Admitting: Family Medicine

## 2013-09-07 DIAGNOSIS — I10 Essential (primary) hypertension: Secondary | ICD-10-CM

## 2013-09-07 DIAGNOSIS — E1165 Type 2 diabetes mellitus with hyperglycemia: Secondary | ICD-10-CM

## 2013-09-07 DIAGNOSIS — IMO0001 Reserved for inherently not codable concepts without codable children: Secondary | ICD-10-CM

## 2013-09-07 DIAGNOSIS — E785 Hyperlipidemia, unspecified: Secondary | ICD-10-CM

## 2013-09-07 DIAGNOSIS — Z125 Encounter for screening for malignant neoplasm of prostate: Secondary | ICD-10-CM

## 2013-09-07 DIAGNOSIS — IMO0002 Reserved for concepts with insufficient information to code with codable children: Secondary | ICD-10-CM

## 2013-09-07 LAB — BASIC METABOLIC PANEL
BUN: 17 mg/dL (ref 6–23)
CO2: 30 mEq/L (ref 19–32)
CREATININE: 0.7 mg/dL (ref 0.4–1.5)
Calcium: 9.3 mg/dL (ref 8.4–10.5)
Chloride: 107 mEq/L (ref 96–112)
GFR: 125.14 mL/min (ref >60.00)
GLUCOSE: 151 mg/dL — AB (ref 70–99)
Potassium: 4.1 mEq/L (ref 3.5–5.1)
Sodium: 141 mEq/L (ref 135–145)

## 2013-09-07 LAB — LIPID PANEL
CHOL/HDL RATIO: 3
Cholesterol: 121 mg/dL (ref 0–200)
HDL: 40.8 mg/dL (ref >39.00)
LDL Cholesterol: 63 mg/dL (ref 0–99)
TRIGLYCERIDES: 84 mg/dL (ref 0.0–149.0)
VLDL: 16.8 mg/dL (ref 0.0–40.0)

## 2013-09-07 LAB — PSA, MEDICARE: PSA: 1.65 ng/mL (ref 0.10–4.00)

## 2013-09-07 LAB — HEMOGLOBIN A1C: Hgb A1c MFr Bld: 7.7 % — ABNORMAL HIGH (ref 4.6–6.5)

## 2013-09-08 ENCOUNTER — Telehealth: Payer: Self-pay | Admitting: Family Medicine

## 2013-09-08 NOTE — Telephone Encounter (Signed)
Relevant patient education mailed to patient.  

## 2013-09-11 ENCOUNTER — Encounter: Payer: Medicare Other | Admitting: Family Medicine

## 2013-09-16 ENCOUNTER — Telehealth: Payer: Self-pay | Admitting: Family Medicine

## 2013-09-16 ENCOUNTER — Encounter: Payer: Self-pay | Admitting: Family Medicine

## 2013-09-16 ENCOUNTER — Ambulatory Visit (INDEPENDENT_AMBULATORY_CARE_PROVIDER_SITE_OTHER): Payer: Medicare Other | Admitting: Family Medicine

## 2013-09-16 VITALS — BP 146/82 | HR 92 | Temp 97.9°F | Ht 69.0 in | Wt 161.5 lb

## 2013-09-16 DIAGNOSIS — I10 Essential (primary) hypertension: Secondary | ICD-10-CM

## 2013-09-16 DIAGNOSIS — R2240 Localized swelling, mass and lump, unspecified lower limb: Secondary | ICD-10-CM | POA: Insufficient documentation

## 2013-09-16 DIAGNOSIS — R229 Localized swelling, mass and lump, unspecified: Secondary | ICD-10-CM

## 2013-09-16 DIAGNOSIS — Z23 Encounter for immunization: Secondary | ICD-10-CM

## 2013-09-16 DIAGNOSIS — Z0000 Encounter for general adult medical examination without abnormal findings: Secondary | ICD-10-CM

## 2013-09-16 DIAGNOSIS — M810 Age-related osteoporosis without current pathological fracture: Secondary | ICD-10-CM

## 2013-09-16 DIAGNOSIS — E1165 Type 2 diabetes mellitus with hyperglycemia: Secondary | ICD-10-CM

## 2013-09-16 DIAGNOSIS — IMO0001 Reserved for inherently not codable concepts without codable children: Secondary | ICD-10-CM

## 2013-09-16 DIAGNOSIS — IMO0002 Reserved for concepts with insufficient information to code with codable children: Secondary | ICD-10-CM

## 2013-09-16 DIAGNOSIS — I2510 Atherosclerotic heart disease of native coronary artery without angina pectoris: Secondary | ICD-10-CM

## 2013-09-16 DIAGNOSIS — E785 Hyperlipidemia, unspecified: Secondary | ICD-10-CM

## 2013-09-16 MED ORDER — SITAGLIPTIN PHOSPHATE 100 MG PO TABS: 100.0000 mg | ORAL_TABLET | Freq: Every day | ORAL | Status: DC

## 2013-09-16 MED ORDER — LOVASTATIN 40 MG PO TABS: 40.0000 mg | ORAL_TABLET | Freq: Every day | ORAL | Status: DC

## 2013-09-16 MED ORDER — METFORMIN HCL 1000 MG PO TABS: 1000.0000 mg | ORAL_TABLET | Freq: Every day | ORAL | Status: DC

## 2013-09-16 MED ORDER — CARVEDILOL 3.125 MG PO TABS: 3.1250 mg | ORAL_TABLET | Freq: Two times a day (BID) | ORAL | Status: DC

## 2013-09-16 MED ORDER — SITAGLIPTIN PHOSPHATE 50 MG PO TABS: 50.0000 mg | ORAL_TABLET | Freq: Every day | ORAL | Status: DC

## 2013-09-16 MED ORDER — RAMIPRIL 10 MG PO CAPS: 10.0000 mg | ORAL_CAPSULE | Freq: Every day | ORAL | Status: DC

## 2013-09-16 MED ORDER — INSULIN GLARGINE 100 UNIT/ML ~~LOC~~ SOLN: 30.0000 [IU] | Freq: Every day | SUBCUTANEOUS | Status: DC

## 2013-09-16 NOTE — Assessment & Plan Note (Signed)
Completed 2 yrs forteo.  dexa with osteopenia, discussed cal/vit D recommended daily supplementation. rec dietary calcium rather than supplement.  Will decrease cal/vit D tab to once daily. Consider recheck DEXA 07/2015.

## 2013-09-16 NOTE — Assessment & Plan Note (Signed)
Staying elevated - increase ramipril to 10mg  daily.

## 2013-09-16 NOTE — Progress Notes (Signed)
Pre visit review using our clinic review tool, if applicable. No additional management support is needed unless otherwise documented below in the visit note. 

## 2013-09-16 NOTE — Assessment & Plan Note (Signed)
Chronic, controlled on lovastatin. Continue med.

## 2013-09-16 NOTE — Assessment & Plan Note (Signed)
I have personally reviewed the Medicare Annual Wellness questionnaire and have noted 1. The patient's medical and social history 2. Their use of alcohol, tobacco or illicit drugs 3. Their current medications and supplements 4. The patient's functional ability including ADL's, fall risks, home safety risks and hearing or visual impairment. 5. Diet and physical activity 6. Evidence for depression or mood disorders The patients weight, height, BMI have been recorded in the chart.  Hearing and vision has been addressed. I have made referrals, counseling and provided education to the patient based review of the above and I have provided the pt with a written personalized care plan for preventive services. See scanned questionairre. Advanced directives discussed: wife would be HCPOA, has discussed wishes with her.  Reviewed preventative protocols and updated unless pt declined. DRE/PSA reassuring today. UTD colonoscopy. prevnar today.

## 2013-09-16 NOTE — Addendum Note (Signed)
Addended by: Royann Shivers A on: 09/16/2013 08:52 AM   Modules accepted: Orders

## 2013-09-16 NOTE — Patient Instructions (Signed)
prevnar today. Double up on janvuia until you run out then new dose will be 100mg  daily. Return to see me in 3-4 months for diabetes follow up.  Try to get most or all of your calcium from your food--aim for 1000 mg/day for women up to 34 and men up to 70 and 1200 mg/day for women over 78 and men over 70. To figure out dietary calcium: 300 mg/day from all non dairy foods plus 300 mg per cup of milk, other dairy, or fortified juice. Non dairy foods that contain calcium: Kale, oranges, sardines, oatmeal, soy milk/soybeans, salmon, white beans, dried figs, turnip greens, almonds, broccoli, tofu.

## 2013-09-16 NOTE — Telephone Encounter (Signed)
Relevant patient education mailed to patient.  

## 2013-09-16 NOTE — Assessment & Plan Note (Signed)
?  epidermal cyst vs LN.  Will monitor for now, if bothersome or enlarging will refer to surgery for further eval.

## 2013-09-16 NOTE — Progress Notes (Signed)
BP 146/82  Pulse 92  Temp(Src) 97.9 F (36.6 C) (Oral)  Ht 5\' 9"  (1.753 m)  Wt 161 lb 8 oz (73.256 kg)  BMI 23.84 kg/m2   CC: medicare wellness visit  Subjective:    Patient ID: Norman Marks, male    DOB: [birth year], 68 y.o.   MRN: [id number]  HPI: Norman Marks is a 68 y.o. male presenting on 09/16/2013 with Annual Exam   HTN - Compliant with current antihypertensive regimen of ramipril 5mg  daily and coreg 3.125mg  bid.  Does check blood pressures at home: running higher than normal.  No low blood pressure readings or symptoms of dizziness/syncope.  Denies  vision changes, CP/tightness, SOB, leg swelling.  Occasional headaches.  DM - A1c elevated - reviewed with patient.  Compliant with meds.  Osteoporosis - completed 2 yr course of forteo.  Recent dexa 07/2013 improved to osteopenia range.  Passes hearing and vision screens today.  No falls  No depression/anhedonia.   Caffeine: 1 cup coffee/day  Lives with wife, 1 dog, 5 cats.  Occupation: retired  Edu: 12th grade  Activity: no regular exercise  Diet: good water, fruits/vegetables daily   Preventative: Colon cancer screening - tubular adenoma, mild diverticulosis, rpt 5 yrs Arlyce Dice) 10/2012 Prostate cancer screening - rpt today. penumovax 06/28/2010.  prevnar today. Flu 04/2013 Tetanus around 2011, pt thinks  zostavax - 01/2013 Advanced directive: knows what they are but doesn't have one set up. Has talked about this with wife.  Wife would be HCPOA.  Does not want drastic measures.   Relevant past medical, surgical, family and social history reviewed and updated as indicated.  Allergies and medications reviewed and updated. Current Outpatient Prescriptions on File Prior to Visit  Medication Sig  . aspirin 81 MG tablet Take 81 mg by mouth daily.  . Calcium Carb-Cholecalciferol (CALCIUM-VITAMIN D) 600-400 MG-UNIT TABS Take 1 tablet by mouth daily.   . Cholecalciferol (VITAMIN D) 2000 UNITS tablet Take 1 tablet (2,000  Units total) by mouth daily.  Marland Kitchen glucosamine-chondroitin (MAX GLUCOSAMINE CHONDROITIN) 500-400 MG tablet Take 1 tablet by mouth 2 (two) times daily.  Marland Kitchen glucose blood (BAYER CONTOUR TEST) test strip Test blood sugar once - twice a day and as directed. 250.02  . ibuprofen (ADVIL,MOTRIN) 200 MG tablet Take 400 mg by mouth as needed.   . Insulin Pen Needle (EXEL PEN NEEDLES 31GX1/4") 31G X 6 MM MISC Use as directed  . Insulin Syringe-Needle U-100 31G X 5/16" 0.3 ML MISC Use to inject insulin as directed.  . traMADol (ULTRAM) 50 MG tablet Take 1 tablet (50 mg total) by mouth every 12 (twelve) hours as needed. Per Dr. Teressa Senter   No current facility-administered medications on file prior to visit.    Review of Systems  Constitutional: Negative for fever, chills, activity change, appetite change, fatigue and unexpected weight change.  HENT: Negative for hearing loss.   Eyes: Negative for visual disturbance.  Respiratory: Negative for cough, chest tightness, shortness of breath and wheezing.   Cardiovascular: Negative for chest pain, palpitations and leg swelling.  Gastrointestinal: Negative for nausea, vomiting, abdominal pain, diarrhea, constipation, blood in stool and abdominal distention.  Genitourinary: Negative for hematuria and difficulty urinating.  Musculoskeletal: Negative for arthralgias, myalgias and neck pain.  Skin: Negative for rash.  Neurological: Positive for light-headedness and headaches. Negative for dizziness, seizures and syncope.  Hematological: Negative for adenopathy. Does not bruise/bleed easily.  Psychiatric/Behavioral: Negative for dysphoric mood. The patient is not nervous/anxious.  Per HPI unless specifically indicated above    Objective:    BP 146/82  Pulse 92  Temp(Src) 97.9 F (36.6 C) (Oral)  Ht 5\' 9"  (1.753 m)  Wt 161 lb 8 oz (73.256 kg)  BMI 23.84 kg/m2  Physical Exam  Nursing note and vitals reviewed. Constitutional: He is oriented to person, place,  and time. He appears well-developed and well-nourished. No distress.  HENT:  Head: Normocephalic and atraumatic.  Right Ear: Hearing, tympanic membrane, external ear and ear canal normal.  Left Ear: Hearing, tympanic membrane, external ear and ear canal normal.  Nose: Nose normal.  Mouth/Throat: Uvula is midline, oropharynx is clear and moist and mucous membranes are normal. No oropharyngeal exudate, posterior oropharyngeal edema or posterior oropharyngeal erythema.  Eyes: Conjunctivae and EOM are normal. Pupils are equal, round, and reactive to light. No scleral icterus.  Neck: Normal range of motion. Neck supple.  Cardiovascular: Normal rate, regular rhythm, normal heart sounds and intact distal pulses.   No murmur heard. Pulses:      Radial pulses are 2+ on the right side, and 2+ on the left side.  Pulmonary/Chest: Effort normal and breath sounds normal. No respiratory distress. He has no wheezes. He has no rales.  Abdominal: Soft. Bowel sounds are normal. He exhibits no distension and no mass. There is no tenderness. There is no rebound and no guarding.  Genitourinary: Prostate normal. Rectal exam shows external hemorrhoid (noninflammed). Rectal exam shows no internal hemorrhoid, no fissure, no mass, no tenderness and anal tone normal. Prostate is not enlarged (20gm) and not tender.  Musculoskeletal: Normal range of motion. He exhibits no edema.  Mobile lump left lateral hip, not tender or growing, present for 1 year  Lymphadenopathy:    He has no cervical adenopathy.  Neurological: He is alert and oriented to person, place, and time.  CN grossly intact, station and gait intact Recall 2/3, 3/3 with cue Calculation 4/5  Skin: Skin is warm and dry. No rash noted.  Psychiatric: He has a normal mood and affect. His behavior is normal. Judgment and thought content normal.   Results for orders placed in visit on 09/07/13  LIPID PANEL      Result Value Ref Range   Cholesterol 121  0 - 200  mg/dL   Triglycerides 84.0  0.0 - 149.0 mg/dL   HDL 40.80  >39.00 mg/dL   VLDL 16.8  0.0 - 40.0 mg/dL   LDL Cholesterol 63  0 - 99 mg/dL   Total CHOL/HDL Ratio 3    PSA, MEDICARE      Result Value Ref Range   PSA 1.65  0.10 - 4.00 ng/ml  HEMOGLOBIN A1C      Result Value Ref Range   Hemoglobin A1C 7.7 (*) 4.6 - 6.5 %  BASIC METABOLIC PANEL      Result Value Ref Range   Sodium 141  135 - 145 mEq/L   Potassium 4.1  3.5 - 5.1 mEq/L   Chloride 107  96 - 112 mEq/L   CO2 30  19 - 32 mEq/L   Glucose, Bld 151 (*) 70 - 99 mg/dL   BUN 17  6 - 23 mg/dL   Creatinine, Ser 0.7  0.4 - 1.5 mg/dL   Calcium 9.3  8.4 - 10.5 mg/dL   GFR 125.14  >60.00 mL/min      Assessment & Plan:   Problem List Items Addressed This Visit   CAD (coronary artery disease)     Decrease calcium  supplement to once daily. Continue ASA.    Relevant Medications      ramipril (ALTACE) capsule      carvedilol (COREG) tablet      lovastatin (MEVACOR) 40 MG tablet   Diabetes type 2, uncontrolled     Chronic, uncontrolled.  Increase januvia to 100mg  daily. Unable to tolerate higher metformin dose.    Relevant Medications      insulin glargine (LANTUS) 100 UNIT/ML injection      metFORMIN (GLUCOPHAGE) tablet      sitaGLIPtin (JANUVIA) tablet   HLD (hyperlipidemia)     Chronic, controlled on lovastatin. Continue med.    HTN (hypertension)     Staying elevated - increase ramipril to 10mg  daily.    Medicare annual wellness visit, subsequent - Primary     I have personally reviewed the Medicare Annual Wellness questionnaire and have noted 1. The patient's medical and social history 2. Their use of alcohol, tobacco or illicit drugs 3. Their current medications and supplements 4. The patient's functional ability including ADL's, fall risks, home safety risks and hearing or visual impairment. 5. Diet and physical activity 6. Evidence for depression or mood disorders The patients weight, height, BMI have been  recorded in the chart.  Hearing and vision has been addressed. I have made referrals, counseling and provided education to the patient based review of the above and I have provided the pt with a written personalized care plan for preventive services. See scanned questionairre. Advanced directives discussed: wife would be HCPOA, has discussed wishes with her.  Reviewed preventative protocols and updated unless pt declined. DRE/PSA reassuring today. UTD colonoscopy. prevnar today.    Osteoporosis     Completed 2 yrs forteo.  dexa with osteopenia, discussed cal/vit D recommended daily supplementation. rec dietary calcium rather than supplement.  Will decrease cal/vit D tab to once daily. Consider recheck DEXA 07/2015.    Skin lump of leg     ?epidermal cyst vs LN.  Will monitor for now, if bothersome or enlarging will refer to surgery for further eval.        Follow up plan: Return in about 5 months (around 02/16/2014), or as needed, for f/u diabetes.

## 2013-09-16 NOTE — Assessment & Plan Note (Signed)
Chronic, uncontrolled.  Increase januvia to 100mg  daily. Unable to tolerate higher metformin dose.

## 2013-09-16 NOTE — Assessment & Plan Note (Signed)
Decrease calcium supplement to once daily. Continue ASA.

## 2013-09-27 ENCOUNTER — Encounter: Payer: Self-pay | Admitting: Family Medicine

## 2013-10-09 ENCOUNTER — Other Ambulatory Visit: Payer: Self-pay | Admitting: Family Medicine

## 2013-10-09 NOTE — Telephone Encounter (Signed)
pts wife request refill advised done on 09/16/13 and Megan at [facility] will get refill ready for pt. Pt notified.

## 2013-10-16 IMAGING — US ABDOMEN ULTRASOUND
1 series · 13 of 25 positions shown · non-contrast
Comparison: none

REASON FOR EXAM: complete  abd discomfort  transaminitis
COMMENTS:

[Series 1: abdomen ultrasound · 0.30mm/px · 13 of 98 slices shown]
[im 1/98]
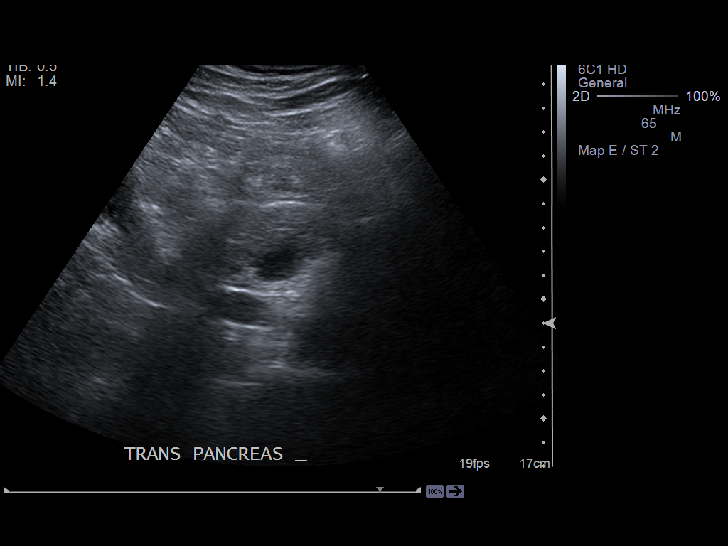
[im 9/98]
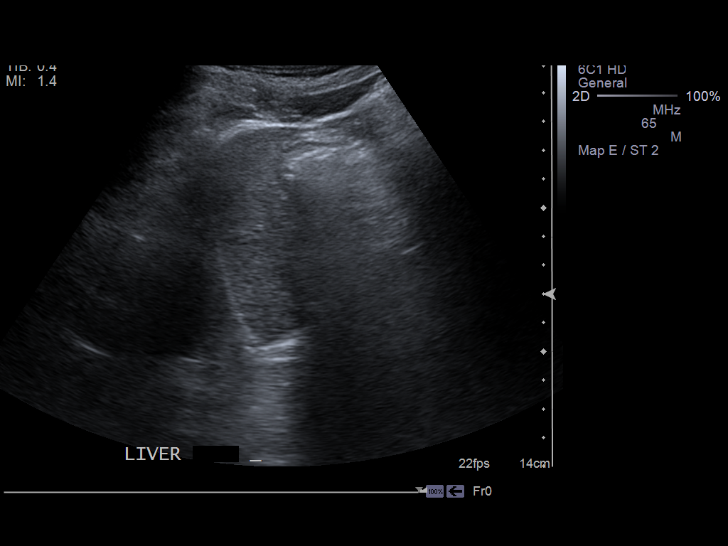
[im 17/98]
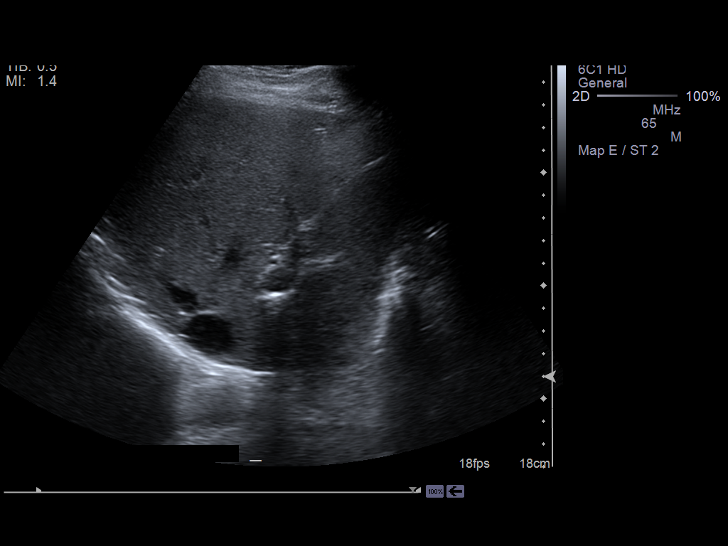
[im 25/98]
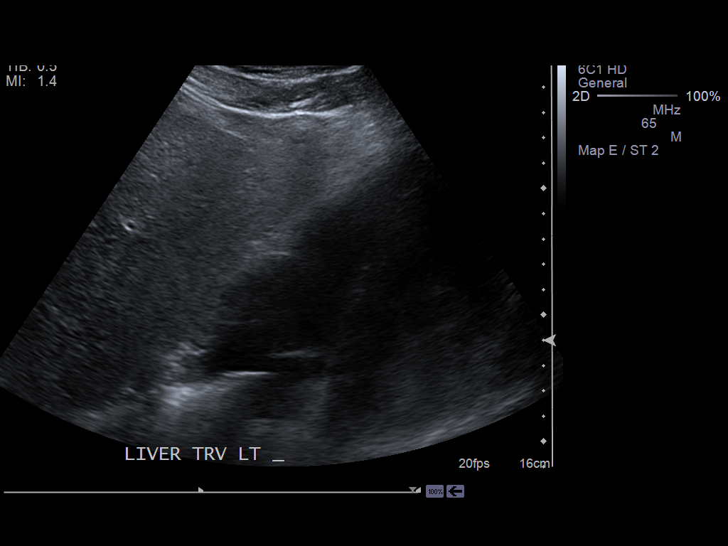
[im 33/98]
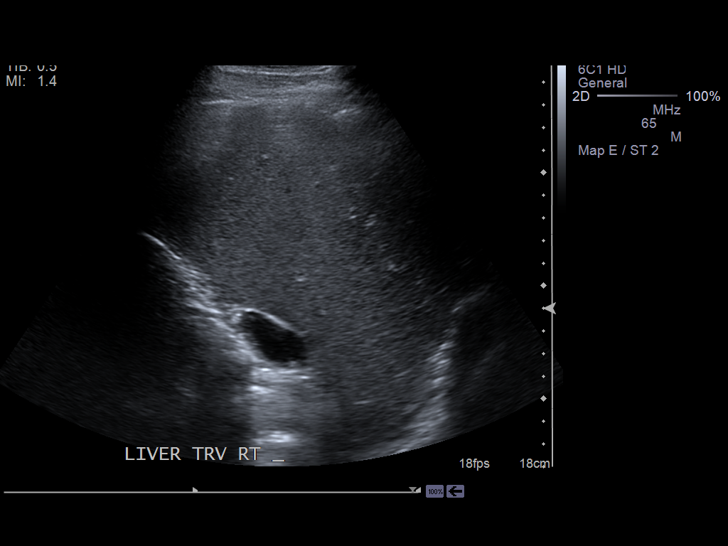
[im 41/98]
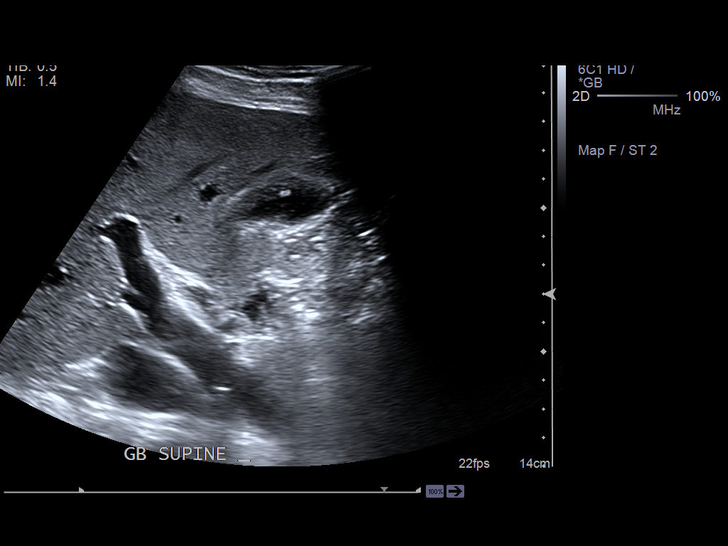
[im 49/98]
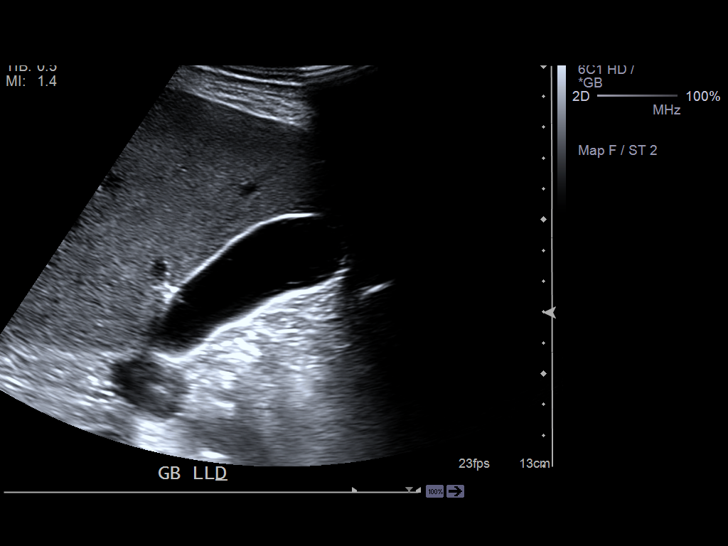
[im 57/98]
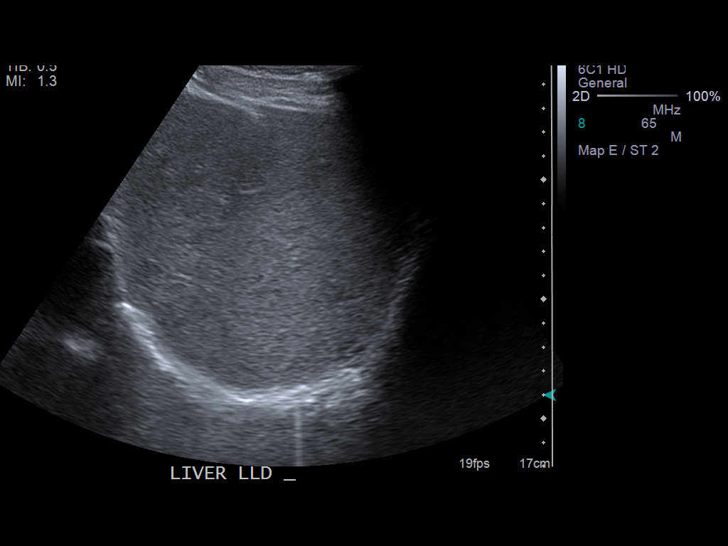
[im 65/98]
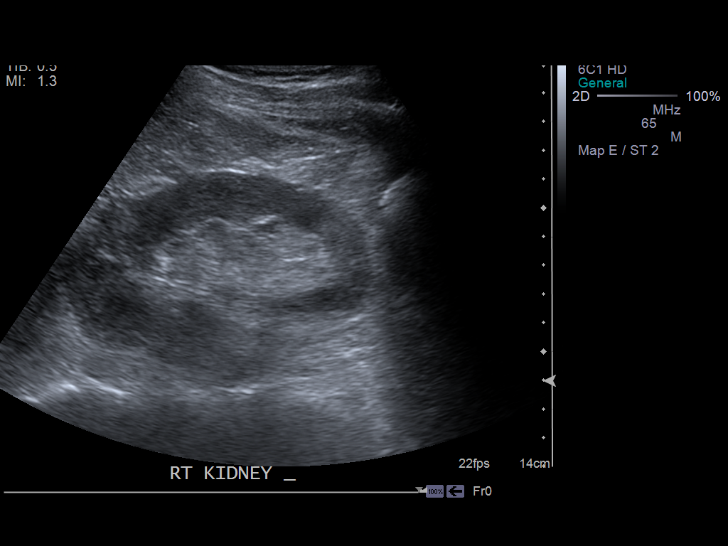
[im 73/98]
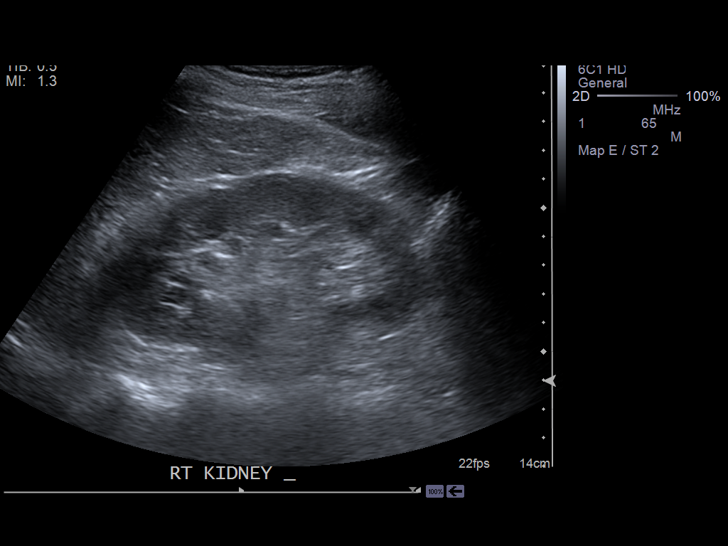
[im 81/98]
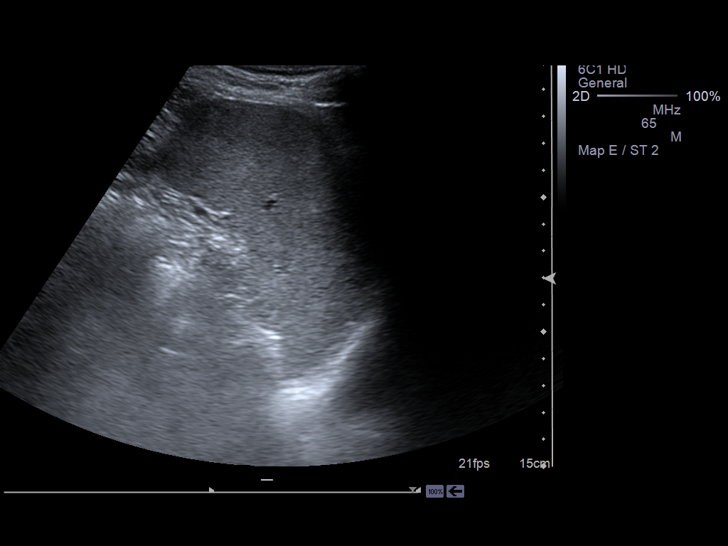
[im 89/98]
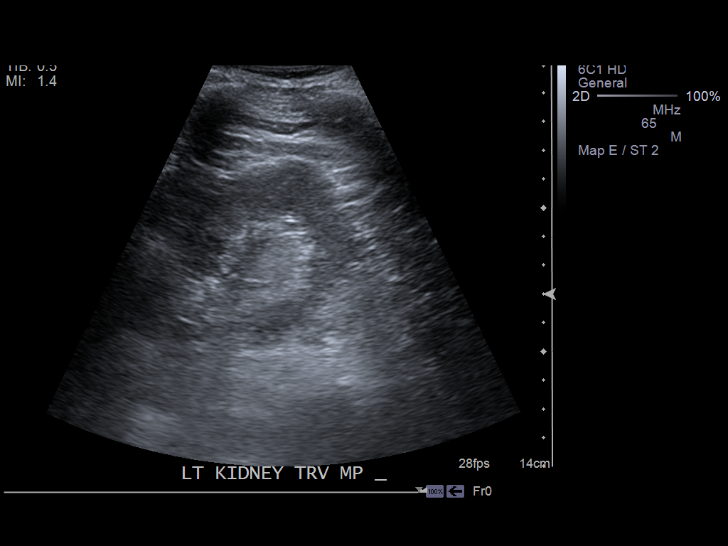
[im 98/98]
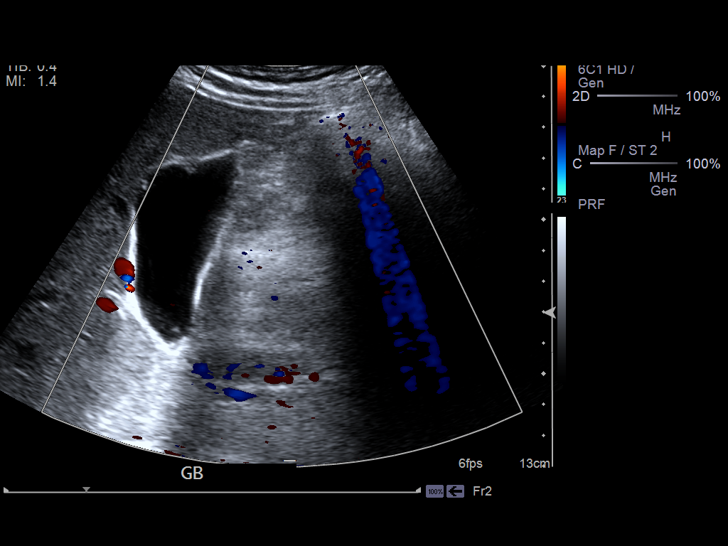

[13 of 25 positions shown; findings below may reference images not displayed]

PROCEDURE:     US  - US ABDOMEN GENERAL SURVEY  - February 17, 2013  [DATE]

RESULT:     Abdominal ultrasound is performed. The included portions of the
pancreas appear unremarkable. The aorta is partially visualized without
evidence of aneurysmal dilation. The hepatic echotexture appears to be
within normal limits with a length of 15.81 cm. There is no intrahepatic or
extrahepatic biliary ductal dilation. The portal venous flow is normal. The
common bile duct diameter is 4.8 mm. The gallbladder wall thickness is
mm. No stones are appreciated. There nonmobile areas of echogenicity in the
gallbladder wall without shadowing. The largest of these is 4.2 mm and may
represent a gallbladder polyp. The study shows a negative sonographic
Murphy's sign without pericholecystic fluid. The right kidney measures
x 3.71 x 4.22 cm. The left kidney measures 11.75 x 5.25 x 4.63 cm. The
proximal inferior vena cava is patent. The spleen measures 10.81 cm with
normal echotexture. Neither kidney shows nephrolithiasis or solid or cystic
mass. No obstructive change is evident.
IMPRESSION: 1. Possible gallbladder polyps. Otherwise, unremarkable abdominal sonogram.

[REDACTED]

## 2013-10-22 ENCOUNTER — Other Ambulatory Visit: Payer: Self-pay

## 2013-11-08 ENCOUNTER — Encounter: Payer: Self-pay | Admitting: Family Medicine

## 2013-12-15 ENCOUNTER — Other Ambulatory Visit: Payer: Self-pay | Admitting: Dermatology

## 2013-12-18 ENCOUNTER — Ambulatory Visit (INDEPENDENT_AMBULATORY_CARE_PROVIDER_SITE_OTHER): Payer: Medicare Other | Admitting: Family Medicine

## 2013-12-18 ENCOUNTER — Other Ambulatory Visit: Payer: Self-pay | Admitting: Family Medicine

## 2013-12-18 ENCOUNTER — Encounter: Payer: Self-pay | Admitting: Family Medicine

## 2013-12-18 VITALS — BP 156/82 | HR 92 | Temp 97.8°F | Wt 163.2 lb

## 2013-12-18 DIAGNOSIS — Z01818 Encounter for other preprocedural examination: Secondary | ICD-10-CM

## 2013-12-18 DIAGNOSIS — IMO0002 Reserved for concepts with insufficient information to code with codable children: Secondary | ICD-10-CM

## 2013-12-18 DIAGNOSIS — IMO0001 Reserved for inherently not codable concepts without codable children: Secondary | ICD-10-CM

## 2013-12-18 DIAGNOSIS — I10 Essential (primary) hypertension: Secondary | ICD-10-CM

## 2013-12-18 DIAGNOSIS — E1165 Type 2 diabetes mellitus with hyperglycemia: Secondary | ICD-10-CM

## 2013-12-18 DIAGNOSIS — E785 Hyperlipidemia, unspecified: Secondary | ICD-10-CM

## 2013-12-18 DIAGNOSIS — I2510 Atherosclerotic heart disease of native coronary artery without angina pectoris: Secondary | ICD-10-CM

## 2013-12-18 LAB — BASIC METABOLIC PANEL
BUN: 17 mg/dL (ref 6–23)
CALCIUM: 9.3 mg/dL (ref 8.4–10.5)
CO2: 29 mEq/L (ref 19–32)
CREATININE: 0.6 mg/dL (ref 0.4–1.5)
Chloride: 106 mEq/L (ref 96–112)
GFR: 134.24 mL/min (ref >60.00)
Glucose, Bld: 167 mg/dL — ABNORMAL HIGH (ref 70–99)
POTASSIUM: 4.5 meq/L (ref 3.5–5.1)
Sodium: 140 mEq/L (ref 135–145)

## 2013-12-18 LAB — HEMOGLOBIN A1C: Hgb A1c MFr Bld: 7.3 % — ABNORMAL HIGH (ref 4.6–6.5)

## 2013-12-18 LAB — CBC WITH DIFFERENTIAL/PLATELET
BASOS ABS: 0 10*3/uL (ref 0.0–0.1)
Basophils Relative: 0.3 % (ref 0.0–3.0)
Eosinophils Absolute: 0.1 10*3/uL (ref 0.0–0.7)
Eosinophils Relative: 1.8 % (ref 0.0–5.0)
HEMATOCRIT: 41.8 % (ref 39.0–52.0)
Hemoglobin: 14 g/dL (ref 13.0–17.0)
LYMPHS ABS: 1.2 10*3/uL (ref 0.7–4.0)
Lymphocytes Relative: 19.6 % (ref 12.0–46.0)
MCHC: 33.5 g/dL (ref 30.0–36.0)
MCV: 92 fl (ref 78.0–100.0)
MONOS PCT: 7.9 % (ref 3.0–12.0)
Monocytes Absolute: 0.5 10*3/uL (ref 0.1–1.0)
Neutro Abs: 4.2 10*3/uL (ref 1.4–7.7)
Neutrophils Relative %: 70.4 % (ref 43.0–77.0)
Platelets: 249 10*3/uL (ref 150.0–400.0)
RBC: 4.54 Mil/uL (ref 4.22–5.81)
RDW: 12.8 % (ref 11.5–15.5)
WBC: 5.9 10*3/uL (ref 4.0–10.5)

## 2013-12-18 LAB — PROTIME-INR
INR: 1 ratio (ref 0.8–1.0)
Prothrombin Time: 10.6 s (ref 9.6–13.1)

## 2013-12-18 MED ORDER — CARVEDILOL 6.25 MG PO TABS: 6.2500 mg | ORAL_TABLET | Freq: Two times a day (BID) | ORAL | Status: DC

## 2013-12-18 NOTE — Progress Notes (Signed)
Pre visit review using our clinic review tool, if applicable. No additional management support is needed unless otherwise documented below in the visit note. 

## 2013-12-18 NOTE — Assessment & Plan Note (Signed)
asxs. 

## 2013-12-18 NOTE — Progress Notes (Signed)
BP 156/82  Pulse 92  Temp(Src) 97.8 F (36.6 C) (Oral)  Wt 163 lb 4 oz (74.05 kg)   CC: 3 mo f/u  Subjective:    Patient ID: Norman Marks, male    DOB: [birth year], 68 y.o.   MRN: [id number]  HPI: [name] is a 68 y.o. male presenting on 12/18/2013 for Follow-up   Preop clearance - brings notice from Dr. Mardelle Matte at [facility] ortho for preop evaluation prior to total R shoulder replacement for arthritis on date TBD. Stays constantly in pain from R shoulder. H/o arthroscopy in past. Denies recent chest pain, tightess, dyspnea on exertion. No regular exercise but able to walk up several flights of stairs without getting short winded. Has tolerated general anesthesia in the past without complications (back surgery x2, right shoulder and right knee).  CAD - has not seen cardiologist [name]) recently (last 5 yrs ago). Known h/o 70% ostial stenosis. Cardiolite 2009 with 70% stenosis of proximal ramus, not amenable to PCI, rec treat medically. CARDIAC CATHETERIZATION Date: 2009 70% stenosis ostium CARDIOVASCULAR STRESS TEST Date: 2011 fixed defect inferior/septal walls without reversibility, LVEF 43% post stress  HTN - Compliant with current antihypertensive regimen of carvedilol 3.125mg  bid, ramipril (recent increase to 10mg  daily).  Does check blood pressures at home: staying elevated.  No low blood pressure readings or symptoms of dizziness/syncope.  Denies vision changes, CP/tightness, SOB. BP Readings from Last 3 Encounters:  12/18/13 156/82  09/16/13 146/82  05/18/13 150/80    DM - regularly does check sugars: generally <150.  Compliant with antihyperglycemic regimen which includes: [place] 100mg  daily (recent increase) and lantus 30 u daily.  Denies low sugars or hypoglycemic symptoms.  Denies paresthesias. Last diabetic eye exam 04/15/2013.  Pneumovax: 06/2010.  Prevnar: 09/2013. Lab Results  Component Value Date   HGBA1C 7.7* 09/07/2013    HLD - compliant with lovastatin without  myalgias.  Lab Results  Component Value Date   [facility] 63 09/07/2013    Past Medical History  Diagnosis Date  . History of chicken pox   . Diabetes type 2, uncontrolled   . Hx of migraines   . BPH (benign prostatic hypertrophy)   . HTN (hypertension)     mild  . HLD (hyperlipidemia)   . CAD (coronary artery disease) 2009    [name] (cardiolite 2009 with 70% stenosis of proximal ramus, not amenable to PCI, rec treat medically)  . Osteopenia 06/2011    T score -3, completed 2 yrs forteo, T score improved to -2.2 (2015)  . History of nephrolithiasis 2012    L ureter, R renal pelvis  . DDD (degenerative disc disease), lumbar     s/p surgery  . Vitamin D deficiency   . History of bundle branch block   . History of adenomatous polyp of colon 10/2012  . CMC arthritis, thumb, degenerative 2014    s/p injections (Sypher)  . Arthritis of shoulder region, right, degenerative 2015    s/p injections (Sypher)   Past Surgical History  Procedure Laterality Date  . Back surgery  1994, 2008    lumbar; x 3, spinal fusion  . Knee surgery Right   . Shoulder surgery Right   . US echocardiography  10/2008    nl LV fxn, EF 50%, mild MR, mild pulm HTN  . Skin surgery  2006    skin cancer  . Cardiovascular stress test  2011    fixed defect inferior/septal walls without reversibility, LVEF 43% post stress  .  Cardiac catheterization  2009    70% stenosis ostium  . Dexa  06/2011    osteoporosis T -2.6 spine, -1.9 hip  . Flexible sigmoidoscopy  2007    pt reported normal (Morayati)  . Colonoscopy  10/2012    tubular adenoma, mild diverticulosis, rpt 5 yrs Deatra Ina)  . Dexa  07/2013    osteopenia T -2.2 femur, -1.2 spine    Family History  Problem Relation Age of Onset  . Cancer Father     lung and prostate  . Diabetes Father   . Heart disease Mother   . Cancer Mother     unsure  . Coronary artery disease Sister 41  . Diabetes Brother   . Stroke Neg Hx   . Colon cancer Neg Hx       Relevant past medical, surgical, family and social history reviewed and updated as indicated.  Allergies and medications reviewed and updated. Current Outpatient Prescriptions on File Prior to Visit  Medication Sig  . aspirin 81 MG tablet Take 81 mg by mouth daily.  . Calcium Carb-Cholecalciferol (CALCIUM-VITAMIN D) 600-400 MG-UNIT TABS Take 1 tablet by mouth daily.   . Cholecalciferol (VITAMIN D) 2000 UNITS tablet Take 1 tablet (2,000 Units total) by mouth daily.  Marland Kitchen glucosamine-chondroitin (MAX GLUCOSAMINE CHONDROITIN) 500-400 MG tablet Take 1 tablet by mouth 2 (two) times daily.  Marland Kitchen glucose blood (BAYER CONTOUR TEST) test strip Test blood sugar once - twice a day and as directed. 250.02  . ibuprofen (ADVIL,MOTRIN) 200 MG tablet Take 400 mg by mouth as needed.   . insulin glargine (LANTUS) 100 UNIT/ML injection Inject 0.3 mLs (30 Units total) into the skin daily.  . Insulin Pen Needle (EXEL PEN NEEDLES 31GX1/4") 31G X 6 MM MISC Use as directed  . Insulin Syringe-Needle U-100 31G X 5/16" 0.3 ML MISC Use to inject insulin as directed.  . lovastatin (MEVACOR) 40 MG tablet Take 1 tablet (40 mg total) by mouth at bedtime.  . metFORMIN (GLUCOPHAGE) 1000 MG tablet Take 1 tablet (1,000 mg total) by mouth daily with breakfast.  . ramipril (ALTACE) 10 MG capsule Take 1 capsule (10 mg total) by mouth daily.  . sitaGLIPtin (JANUVIA) 100 MG tablet Take 1 tablet (100 mg total) by mouth daily.  . traMADol (ULTRAM) 50 MG tablet Take 1 tablet (50 mg total) by mouth every 12 (twelve) hours as needed. Per Dr. Daylene Katayama   No current facility-administered medications on file prior to visit.    Review of Systems Per HPI unless specifically indicated above    Objective:    BP 156/82  Pulse 92  Temp(Src) 97.8 F (36.6 C) (Oral)  Wt 163 lb 4 oz (74.05 kg)  Physical Exam  Nursing note and vitals reviewed. Constitutional: He appears well-developed and well-nourished. No distress.  HENT:  Mouth/Throat:  Oropharynx is clear and moist. No oropharyngeal exudate.  Neck: Carotid bruit is not present.  Cardiovascular: Normal rate, regular rhythm, normal heart sounds and intact distal pulses.   No murmur heard. Pulmonary/Chest: Effort normal and breath sounds normal. No respiratory distress. He has no wheezes. He has no rales.  Musculoskeletal: He exhibits no edema.  Skin: Skin is warm and dry. No rash noted.       Assessment & Plan:   Problem List Items Addressed This Visit   Preoperative clearance - Primary     Known CAD - medically treated - as well as not optimally controlled DM and HTN, and well controlled HLD. asxs from CAD  standpoint. Check CBC, PT/INR and BMP today.  Will also rpt EKG today. EKG - NSR 90, chronic LBBB, no marked change compared to EKG from 2012. Has been ~5 yrs since last cardiac evaluation so will refer to cardiologist for further eval and cardiac clearance prior to upcoming R shoulder replacement.  Pt agrees with plan I would suggest holding aspirin 5 days prior to surgery, but will defer to cardiology for recs.     Relevant Orders      CBC with Differential      Protime-INR      EKG 12-Lead (Completed)      Ambulatory referral to Cardiology   HTN (hypertension)     Persistently elevated despite recent increase in ramipril - will increase coreg to 6.25mg  bid. Pt agrees with plan. Also provided with HTN educational information    Relevant Medications      carvedilol (COREG) tablet   HLD (hyperlipidemia)      Chronic, stable. Continue lovastatin.  Lab Results  Component Value Date   LDLCALC 63 09/07/2013      Relevant Medications      carvedilol (COREG) tablet   Diabetes type 2, uncontrolled     Recent increase in [place] to 100mg  daily - will recheck A1c today.  UTD eye exam, foot exam, pneumococcal vaccines.    Relevant Orders      Hemoglobin A1c      Basic metabolic panel      Ambulatory referral to Cardiology   CAD (coronary artery disease)      asxs.    Relevant Medications      carvedilol (COREG) tablet   Other Relevant Orders      Ambulatory referral to Cardiology       Follow up plan: Return in about 4 months (around 04/19/2014), or as needed, for follow up visit.

## 2013-12-18 NOTE — Assessment & Plan Note (Signed)
Chronic, stable. Continue lovastatin.  Lab Results  Component Value Date   LDLCALC 63 09/07/2013

## 2013-12-18 NOTE — Assessment & Plan Note (Addendum)
Known CAD - medically treated - as well as not optimally controlled DM and HTN, and well controlled HLD. asxs from CAD standpoint. Check CBC, PT/INR and BMP today.  Will also rpt EKG today. EKG - NSR 90, chronic LBBB, no marked change compared to EKG from 2012. Has been ~5 yrs since last cardiac evaluation so will refer to cardiologist for further eval and cardiac clearance prior to upcoming R shoulder replacement.  Pt agrees with plan I would suggest holding aspirin 5 days prior to surgery, but will defer to cardiology for recs.

## 2013-12-18 NOTE — Assessment & Plan Note (Signed)
Recent increase in [place] to 100mg  daily - will recheck A1c today.  UTD eye exam, foot exam, pneumococcal vaccines.

## 2013-12-18 NOTE — Patient Instructions (Addendum)
EKG today. Blood work today. Increase coreg to 6.25mg  twice daily (new dose sent to pharmacy). Pass by Marion's office for referral to cardiologist for cardiac clearance prior to surgery. Good to see you today. Good luck on upcoming surgery.  Your goal blood pressure is <140/90. Work on low salt/sodium diet - goal <1.5gm (1,500mg ) per day. Eat a diet high in fruits/vegetables and whole grains.  Look into mediterranean and DASH diet. Goal activity is 119min/wk of moderate intensity exercise.  This can be split into 30 minute chunks.  If you are not at this level, you can start with smaller 10-15 min increments and slowly build up activity. Look at [facility] for more resources

## 2013-12-18 NOTE — Assessment & Plan Note (Addendum)
Persistently elevated despite recent increase in ramipril - will increase coreg to 6.25mg  bid. Pt agrees with plan. Also provided with HTN educational information

## 2013-12-19 ENCOUNTER — Other Ambulatory Visit: Payer: Self-pay | Admitting: Family Medicine

## 2013-12-19 MED ORDER — INSULIN GLARGINE 100 UNIT/ML ~~LOC~~ SOLN: 32.0000 [IU] | Freq: Every day | SUBCUTANEOUS | Status: DC

## 2013-12-22 ENCOUNTER — Encounter: Payer: Self-pay | Admitting: Cardiovascular Disease

## 2013-12-22 ENCOUNTER — Ambulatory Visit (INDEPENDENT_AMBULATORY_CARE_PROVIDER_SITE_OTHER): Payer: Medicare Other | Admitting: Cardiovascular Disease

## 2013-12-22 VITALS — BP 132/72 | HR 91 | Ht 69.0 in | Wt 163.0 lb

## 2013-12-22 DIAGNOSIS — I10 Essential (primary) hypertension: Secondary | ICD-10-CM

## 2013-12-22 DIAGNOSIS — I2510 Atherosclerotic heart disease of native coronary artery without angina pectoris: Secondary | ICD-10-CM

## 2013-12-22 DIAGNOSIS — E785 Hyperlipidemia, unspecified: Secondary | ICD-10-CM

## 2013-12-22 DIAGNOSIS — R0602 Shortness of breath: Secondary | ICD-10-CM

## 2013-12-22 DIAGNOSIS — Z01818 Encounter for other preprocedural examination: Secondary | ICD-10-CM

## 2013-12-22 NOTE — Patient Instructions (Addendum)
Norman Marks  Your caregiver has ordered a Stress Test with nuclear imaging. The purpose of this test is to evaluate the blood supply to your heart muscle. This procedure is referred to as a "Non-Invasive Stress Test." This is because other than having an IV started in your vein, nothing is inserted or "invades" your body. Cardiac stress tests are done to find areas of poor blood flow to the heart by determining the extent of coronary artery disease (CAD). Some patients exercise on a treadmill, which naturally increases the blood flow to your heart, while others who are  unable to walk on a treadmill due to physical limitations have a pharmacologic/chemical stress agent called Lexiscan . This medicine will mimic walking on a treadmill by temporarily increasing your coronary blood flow.   Please note: these test may take anywhere between 2-4 hours to complete  PLEASE REPORT TO [facility] AT THE FIRST DESK WILL DIRECT YOU WHERE TO GO  Date of Procedure:___________06/11/15__________________________  Arrival Time for Procedure:_______0745 AM_______________________  Instructions regarding medication:   __x__ : Hold diabetes medication morning of procedure   PLEASE NOTIFY THE OFFICE AT LEAST 24 HOURS IN ADVANCE IF YOU ARE UNABLE TO KEEP YOUR APPOINTMENT.  [phone] AND  PLEASE NOTIFY NUCLEAR MEDICINE AT [facility] AT LEAST 24 HOURS IN ADVANCE IF YOU ARE UNABLE TO KEEP YOUR APPOINTMENT. [phone]  How to prepare for your Myoview test:  1. Do not eat or drink after midnight 2. No caffeine for 24 hours prior to test 3. No smoking 24 hours prior to test. 4. Your medication may be taken with water.  If your doctor stopped a medication because of this test, do not take that medication. 5. Ladies, please do not wear dresses.  Skirts or pants are appropriate. Please wear a short sleeve shirt. 6. No perfume, cologne or lotion. 7. Wear comfortable walking shoes. No  heels!   Your physician wants you to follow-up in: 6 months. You will receive a reminder letter in the mail two months in advance. If you don't receive a letter, please call our office to schedule the follow-up appointment.

## 2013-12-24 ENCOUNTER — Ambulatory Visit: Payer: Self-pay | Admitting: Cardiovascular Disease

## 2013-12-24 DIAGNOSIS — Z01810 Encounter for preprocedural cardiovascular examination: Secondary | ICD-10-CM

## 2013-12-24 DIAGNOSIS — R9431 Abnormal electrocardiogram [ECG] [EKG]: Secondary | ICD-10-CM

## 2013-12-25 ENCOUNTER — Telehealth: Payer: Self-pay | Admitting: *Deleted

## 2013-12-25 ENCOUNTER — Other Ambulatory Visit: Payer: Self-pay

## 2013-12-25 ENCOUNTER — Encounter: Payer: Self-pay | Admitting: Cardiovascular Disease

## 2013-12-25 DIAGNOSIS — R0602 Shortness of breath: Secondary | ICD-10-CM

## 2013-12-25 NOTE — Assessment & Plan Note (Signed)
The patient has underlying chronic left bundle branch block and known history of one-vessel coronary artery disease on previous cardiac catheterization in 2009. Overall, he has no convincing symptoms of angina other than mild exertional dyspnea. Overall functional capacity is also reasonable. Given no recent cardiac evaluation, I recommend a pharmacologic nuclear stress test.

## 2013-12-25 NOTE — Progress Notes (Signed)
Primary care physician:[name]  HPI  This is a pleasant 68 year old male who was referred for preoperative cardiovascular evaluation before right shoulder surgery. He has known history of coronary artery disease and left bundle branch block. He underwent cardiac catheterization by Dr. [name] in 2009 which showed one-vessel coronary artery disease involving the proximal ramus (70% stenosis). He has known history of hypertension, diabetes and hyperlipidemia. He denies any chest discomfort. He does complain of mild exertional dyspnea. No orthopnea or PND. Overall functional capacity is reasonable. He is able to perform activities of daily living.   Allergies  Allergen Reactions  . Percocet [Oxycodone-Acetaminophen] Itching     Current Outpatient Prescriptions on File Prior to Visit  Medication Sig Dispense Refill  . aspirin 81 MG tablet Take 81 mg by mouth daily.      Marland Kitchen BAYER CONTOUR TEST test strip USE TO TEST SUGAR ONCE TO TWICE A DAY AS DIRECTED  100 each  3  . Calcium Carb-Cholecalciferol (CALCIUM-VITAMIN D) 600-400 MG-UNIT TABS Take 1 tablet by mouth daily.       . carvedilol (COREG) 6.25 MG tablet Take 1 tablet (6.25 mg total) by mouth 2 (two) times daily with a meal.  180 tablet  3  . Cholecalciferol (VITAMIN D) 2000 UNITS tablet Take 1 tablet (2,000 Units total) by mouth daily.      Marland Kitchen glucosamine-chondroitin (MAX GLUCOSAMINE CHONDROITIN) 500-400 MG tablet Take 1 tablet by mouth 2 (two) times daily.      Marland Kitchen ibuprofen (ADVIL,MOTRIN) 200 MG tablet Take 400 mg by mouth as needed.       . insulin glargine (LANTUS) 100 UNIT/ML injection Inject 0.32 mLs (32 Units total) into the skin daily.  10 mL  11  . Insulin Pen Needle (EXEL PEN NEEDLES 31GX1/4") 31G X 6 MM MISC Use as directed  100 each  3  . Insulin Syringe-Needle U-100 31G X 5/16" 0.3 ML MISC Use to inject insulin as directed.  100 each  3  . lovastatin (MEVACOR) 40 MG tablet Take 1 tablet (40 mg total) by mouth at bedtime.  90  tablet  3  . metFORMIN (GLUCOPHAGE) 1000 MG tablet Take 1 tablet (1,000 mg total) by mouth daily with breakfast.  90 tablet  3  . ramipril (ALTACE) 10 MG capsule Take 1 capsule (10 mg total) by mouth daily.  90 capsule  3  . sitaGLIPtin (JANUVIA) 100 MG tablet Take 1 tablet (100 mg total) by mouth daily.  90 tablet  3  . traMADol (ULTRAM) 50 MG tablet Take 1 tablet (50 mg total) by mouth every 12 (twelve) hours as needed. Per Dr. Daylene Katayama  30 tablet  0   No current facility-administered medications on file prior to visit.     Past Medical History  Diagnosis Date  . History of chicken pox   . Diabetes type 2, uncontrolled   . Hx of migraines   . BPH (benign prostatic hypertrophy)   . HTN (hypertension)     mild  . HLD (hyperlipidemia)   . CAD (coronary artery disease) 2009    [name] (cardiolite 2009 with 70% stenosis of proximal ramus, not amenable to PCI, rec treat medically)  . Osteopenia 06/2011    T score -3, completed 2 yrs forteo, T score improved to -2.2 (2015)  . History of nephrolithiasis 2012    L ureter, R renal pelvis  . DDD (degenerative disc disease), lumbar     s/p surgery  . Vitamin D deficiency   .  History of bundle branch block   . History of adenomatous polyp of colon 10/2012  . CMC arthritis, thumb, degenerative 2014    s/p injections (Sypher)  . Arthritis of shoulder region, right, degenerative 2015    s/p injections (Sypher)     Past Surgical History  Procedure Laterality Date  . Back surgery  1994, 2008    lumbar; x 3, spinal fusion  . Knee surgery Right   . Shoulder surgery Right   . US echocardiography  10/2008    nl LV fxn, EF 50%, mild MR, mild pulm HTN  . Skin surgery  2006    skin cancer  . Cardiovascular stress test  2011    fixed defect inferior/septal walls without reversibility, LVEF 43% post stress  . Dexa  06/2011    osteoporosis T -2.6 spine, -1.9 hip  . Flexible sigmoidoscopy  2007    pt reported normal (Morayati)  . Colonoscopy   10/2012    tubular adenoma, mild diverticulosis, rpt 5 yrs Deatra Ina)  . Dexa  07/2013    osteopenia T -2.2 femur, -1.2 spine  . Cardiac catheterization  2009    70% stenosis ostium     Family History  Problem Relation Age of Onset  . Cancer Father     lung and prostate  . Diabetes Father   . Heart disease Mother   . Cancer Mother     unsure  . Coronary artery disease Sister 69  . Heart attack Sister 1  . Diabetes Brother   . Stroke Neg Hx   . Colon cancer Neg Hx      History   Social History  . Marital Status: Married    Spouse Name: N/A    Number of Children: N/A  . Years of Education: N/A   Occupational History  . Not on file.   Social History Main Topics  . Smoking status: Former Smoker    Types: Cigarettes    Quit date: 10/20/1992  . Smokeless tobacco: Former Systems developer    Types: [place] date: 10/20/1992  . Alcohol Use: No  . Drug Use: No  . Sexual Activity: Not on file   Other Topics Concern  . Not on file   Social History Narrative   Caffeine: 1 cup coffee/day   Lives with wife, 1 dog, 5 cats.   Occupation: retired   Edu: 12th grade   Activity: no regular exercise   Diet: good water, fruits/vegetables daily     ROS A 10 point review of system was performed. It is negative other than that mentioned in the history of present illness.   PHYSICAL EXAM   BP 132/72  Pulse 91  Ht 5\' 9"  (1.753 m)  Wt 163 lb (73.936 kg)  BMI 24.06 kg/m2 Constitutional: He is oriented to person, place, and time. He appears well-developed and well-nourished. No distress.  HENT: No nasal discharge.  Head: Normocephalic and atraumatic.  Eyes: Pupils are equal and round.  No discharge. Neck: Normal range of motion. Neck supple. No JVD present. No thyromegaly present.  Cardiovascular: Normal rate, regular rhythm, normal heart sounds. Exam reveals no gallop and no friction rub. No murmur heard.  Pulmonary/Chest: Effort normal and breath sounds normal. No stridor. No  respiratory distress. He has no wheezes. He has no rales. He exhibits no tenderness.  Abdominal: Soft. Bowel sounds are normal. He exhibits no distension. There is no tenderness. There is no rebound and no guarding.  Musculoskeletal: Normal range  of motion. He exhibits no edema and no tenderness.  Neurological: He is alert and oriented to person, place, and time. Coordination normal.  Skin: Skin is warm and dry. No rash noted. He is not diaphoretic. No erythema. No pallor.  Psychiatric: He has a normal mood and affect. His behavior is normal. Judgment and thought content normal.       EKG: Sinus rhythm with PVCs and left bundle branch block.   ASSESSMENT AND PLAN

## 2013-12-25 NOTE — Telephone Encounter (Signed)
Pt calls for stress test results. Results given to pt & copy forwarded to Dr. Darcella Gasman RN

## 2013-12-25 NOTE — Telephone Encounter (Signed)
See result note 6/12

## 2013-12-25 NOTE — Assessment & Plan Note (Signed)
Lab Results  Component Value Date   CHOL 121 09/07/2013   HDL 40.80 09/07/2013   LDLCALC 63 09/07/2013   TRIG 84.0 09/07/2013   CHOLHDL 3 09/07/2013   Continue lovastatin. He is at target of less than 70.

## 2013-12-25 NOTE — Assessment & Plan Note (Signed)
Blood pressure is reasonably controlled on current medications. 

## 2013-12-25 NOTE — Telephone Encounter (Signed)
Please call patient with nuclear stress results

## 2013-12-25 NOTE — Assessment & Plan Note (Signed)
Continue medical therapy as is being done.

## 2013-12-28 ENCOUNTER — Telehealth: Payer: Self-pay | Admitting: *Deleted

## 2013-12-28 NOTE — Telephone Encounter (Signed)
Faxed pre op clearance to [place]

## 2014-01-13 ENCOUNTER — Encounter (HOSPITAL_COMMUNITY): Payer: Self-pay | Admitting: Pharmacy Technician

## 2014-01-18 ENCOUNTER — Encounter (HOSPITAL_COMMUNITY)
Admission: RE | Admit: 2014-01-18 | Discharge: 2014-01-18 | Disposition: A | Discharge status: Home / Self Care | Payer: Medicare Other | Source: Ambulatory Visit | Attending: Orthopedic Surgery | Admitting: Orthopedic Surgery

## 2014-01-18 ENCOUNTER — Encounter (HOSPITAL_COMMUNITY): Payer: Self-pay

## 2014-01-18 ENCOUNTER — Encounter (HOSPITAL_COMMUNITY)
Admission: RE | Admit: 2014-01-18 | Discharge: 2014-01-18 | Disposition: A | Discharge status: Home / Self Care | Payer: Medicare Other | Source: Ambulatory Visit | Attending: Anesthesiology | Admitting: Anesthesiology

## 2014-01-18 DIAGNOSIS — Z01818 Encounter for other preprocedural examination: Secondary | ICD-10-CM | POA: Insufficient documentation

## 2014-01-18 DIAGNOSIS — Z01812 Encounter for preprocedural laboratory examination: Secondary | ICD-10-CM | POA: Insufficient documentation

## 2014-01-18 HISTORY — DX: Malignant (primary) neoplasm, unspecified: C80.1

## 2014-01-18 HISTORY — DX: Headache: R51

## 2014-01-18 LAB — TYPE AND SCREEN
ABO/RH(D): O POS
Antibody Screen: NEGATIVE

## 2014-01-18 LAB — BASIC METABOLIC PANEL
Anion gap: 11 (ref 5–15)
BUN: 11 mg/dL (ref 6–23)
CO2: 27 mEq/L (ref 19–32)
Calcium: 9.8 mg/dL (ref 8.4–10.5)
Chloride: 104 mEq/L (ref 96–112)
Creatinine, Ser: 0.69 mg/dL (ref 0.50–1.35)
Glucose, Bld: 108 mg/dL — ABNORMAL HIGH (ref 70–99)
POTASSIUM: 4.5 meq/L (ref 3.7–5.3)
Sodium: 142 mEq/L (ref 137–147)

## 2014-01-18 LAB — CBC
HCT: 42.2 % (ref 39.0–52.0)
Hemoglobin: 14.3 g/dL (ref 13.0–17.0)
MCH: 31.5 pg (ref 26.0–34.0)
MCHC: 33.9 g/dL (ref 30.0–36.0)
MCV: 93 fL (ref 78.0–100.0)
PLATELETS: 237 10*3/uL (ref 150–400)
RBC: 4.54 MIL/uL (ref 4.22–5.81)
RDW: 12.8 % (ref 11.5–15.5)
WBC: 6.7 10*3/uL (ref 4.0–10.5)

## 2014-01-18 LAB — SURGICAL PCR SCREEN
MRSA, PCR: NEGATIVE
STAPHYLOCOCCUS AUREUS: NEGATIVE

## 2014-01-18 NOTE — Pre-Procedure Instructions (Signed)
Norman Marks  01/18/2014   Your procedure is scheduled on:  Tuesday July 14 th at  0730 AM  Report to [place] at 0530 AM.  Call this number if you have problems the morning of surgery: [phone]   Remember:    Do not eat food or drink liquids after midnight.   Take these medicines the morning of surgery with A SIP OF WATER: Carvedilol ( Coreg),and Altace. Stop Aspirin, Ibuprofen, Vitamins and herbal meds 5 day prior to surgery. No diabetic meds day of surgery   Do not wear jewelry..  Do not wear lotions, powders, or perfumes. You may wear deodorant.             Men may shave face and neck.  Do not bring valuables to the hospital.  [facility] is not responsible  for any belongings or valuables.               Contacts, dentures or bridgework may not be worn into surgery.  Leave suitcase in the car. After surgery it may be brought to your room.  For patients admitted to the hospital, discharge time is determined by your  treatment team.               Patients discharged the day of surgery will not be allowed to drive home.  Special Instructions: [facility] - Preparing for Surgery  Before surgery, you can play an important role.  Because skin is not sterile, your skin needs to be as free of germs as possible.  You can reduce the number of germs on you skin by washing with CHG (chlorahexidine gluconate) soap before surgery.  CHG is an antiseptic cleaner which kills germs and bonds with the skin to continue killing germs even after washing.  Please DO NOT use if you have an allergy to CHG or antibacterial soaps.  If your skin becomes reddened/irritated stop using the CHG and inform your nurse when you arrive at Short Stay.  Do not shave (including legs and underarms) for at least 48 hours prior to the first CHG shower.  You may shave your face.  Please follow these instructions carefully:   1.  Shower with CHG Soap the night before surgery and the                                 morning of Surgery.  2.  If you choose to wash your hair, wash your hair first as usual with your       normal shampoo.  3.  After you shampoo, rinse your hair and body thoroughly to remove the                      Shampoo.  4.  Use CHG as you would any other liquid soap.  You can apply chg directly       to the skin and wash gently with scrungie or a clean washcloth.  5.  Apply the CHG Soap to your body ONLY FROM THE NECK DOWN.        Do not use on open wounds or open sores.  Avoid contact with your eyes,       ears, mouth and genitals (private parts).  Wash genitals (private parts)       with your normal soap.  6.  Wash thoroughly, paying special attention to the area where  your surgery        will be performed.  7.  Thoroughly rinse your body with warm water from the neck down.  8.  DO NOT shower/wash with your normal soap after using and rinsing off       the CHG Soap.  9.  Pat yourself dry with a clean towel.            10.  Wear clean pajamas.            11.  Place clean sheets on your bed the night of your first shower and do not        sleep with pets.  Day of Surgery  Do not apply any lotions/deoderants the morning of surgery.  Please wear clean clothes to the hospital/surgery center.      Please read over the following fact sheets that you were given: Pain Booklet, Coughing and Deep Breathing, Blood Transfusion Information, MRSA Information and Surgical Site Infection Prevention

## 2014-01-23 ENCOUNTER — Other Ambulatory Visit: Payer: Self-pay | Admitting: Orthopedic Surgery

## 2014-01-25 MED ORDER — CEFAZOLIN SODIUM-DEXTROSE 2-3 GM-% IV SOLR
2.0000 g | INTRAVENOUS | Status: AC
  Administered 2014-01-26: 2 g via INTRAVENOUS
  Filled 2014-01-25: qty 50

## 2014-01-26 ENCOUNTER — Encounter (HOSPITAL_COMMUNITY): Payer: Medicare Other | Admitting: Certified Registered Nurse Anesthetist

## 2014-01-26 ENCOUNTER — Encounter (HOSPITAL_COMMUNITY): Payer: Self-pay | Admitting: *Deleted

## 2014-01-26 ENCOUNTER — Inpatient Hospital Stay (HOSPITAL_COMMUNITY): Payer: Medicare Other

## 2014-01-26 ENCOUNTER — Inpatient Hospital Stay (HOSPITAL_COMMUNITY): Payer: Medicare Other | Admitting: Certified Registered Nurse Anesthetist

## 2014-01-26 ENCOUNTER — Inpatient Hospital Stay (HOSPITAL_COMMUNITY)
Admission: RE | Admit: 2014-01-26 | Discharge: 2014-01-28 | DRG: 483 | Disposition: A | Discharge status: Home Health | Payer: Medicare Other | Source: Ambulatory Visit | Attending: Orthopedic Surgery | Admitting: Orthopedic Surgery

## 2014-01-26 ENCOUNTER — Encounter (HOSPITAL_COMMUNITY)
Admission: RE | Disposition: A | Discharge status: Home Health | Payer: Self-pay | Source: Ambulatory Visit | Attending: Orthopedic Surgery

## 2014-01-26 DIAGNOSIS — M1990 Unspecified osteoarthritis, unspecified site: Secondary | ICD-10-CM | POA: Diagnosis present

## 2014-01-26 DIAGNOSIS — Z79899 Other long term (current) drug therapy: Secondary | ICD-10-CM | POA: Diagnosis not present

## 2014-01-26 DIAGNOSIS — M19019 Primary osteoarthritis, unspecified shoulder: Secondary | ICD-10-CM | POA: Diagnosis present

## 2014-01-26 DIAGNOSIS — E785 Hyperlipidemia, unspecified: Secondary | ICD-10-CM | POA: Diagnosis present

## 2014-01-26 DIAGNOSIS — I10 Essential (primary) hypertension: Secondary | ICD-10-CM | POA: Diagnosis present

## 2014-01-26 DIAGNOSIS — Z7982 Long term (current) use of aspirin: Secondary | ICD-10-CM

## 2014-01-26 DIAGNOSIS — Z85828 Personal history of other malignant neoplasm of skin: Secondary | ICD-10-CM | POA: Diagnosis not present

## 2014-01-26 DIAGNOSIS — I2510 Atherosclerotic heart disease of native coronary artery without angina pectoris: Secondary | ICD-10-CM | POA: Diagnosis present

## 2014-01-26 DIAGNOSIS — R110 Nausea: Secondary | ICD-10-CM | POA: Diagnosis not present

## 2014-01-26 DIAGNOSIS — E119 Type 2 diabetes mellitus without complications: Secondary | ICD-10-CM | POA: Diagnosis present

## 2014-01-26 DIAGNOSIS — Z87442 Personal history of urinary calculi: Secondary | ICD-10-CM

## 2014-01-26 DIAGNOSIS — M19011 Primary osteoarthritis, right shoulder: Secondary | ICD-10-CM

## 2014-01-26 DIAGNOSIS — Z860100 Personal history of colon polyps, unspecified: Secondary | ICD-10-CM

## 2014-01-26 DIAGNOSIS — Z794 Long term (current) use of insulin: Secondary | ICD-10-CM | POA: Diagnosis not present

## 2014-01-26 DIAGNOSIS — E559 Vitamin D deficiency, unspecified: Secondary | ICD-10-CM | POA: Diagnosis present

## 2014-01-26 DIAGNOSIS — Z87891 Personal history of nicotine dependence: Secondary | ICD-10-CM

## 2014-01-26 HISTORY — DX: Primary osteoarthritis, right shoulder: M19.011

## 2014-01-26 HISTORY — PX: TOTAL SHOULDER ARTHROPLASTY: SHX126

## 2014-01-26 LAB — GLUCOSE, CAPILLARY
Glucose-Capillary: 171 mg/dL — ABNORMAL HIGH (ref 70–99)
Glucose-Capillary: 173 mg/dL — ABNORMAL HIGH (ref 70–99)
Glucose-Capillary: 158 mg/dL — ABNORMAL HIGH (ref 70–99)
Glucose-Capillary: 116 mg/dL — ABNORMAL HIGH (ref 70–99)
Glucose-Capillary: 140 mg/dL — ABNORMAL HIGH (ref 70–99)

## 2014-01-26 SURGERY — ARTHROPLASTY, SHOULDER, TOTAL
Anesthesia: Regional | Site: Shoulder | Laterality: Right

## 2014-01-26 MED ORDER — ACETAMINOPHEN 650 MG RE SUPP: 650.0000 mg | Freq: Four times a day (QID) | RECTAL | Status: DC | PRN

## 2014-01-26 MED ORDER — ROCURONIUM BROMIDE 50 MG/5ML IV SOLN
INTRAVENOUS | Status: AC
  Filled 2014-01-26: qty 1

## 2014-01-26 MED ORDER — SENNA 8.6 MG PO TABS
1.0000 | ORAL_TABLET | Freq: Two times a day (BID) | ORAL | Status: DC
  Administered 2014-01-26 – 2014-01-27 (×3): 8.6 mg via ORAL
  Filled 2014-01-26 (×6): qty 1

## 2014-01-26 MED ORDER — NEOSTIGMINE METHYLSULFATE 10 MG/10ML IV SOLN
INTRAVENOUS | Status: AC
  Filled 2014-01-26: qty 1

## 2014-01-26 MED ORDER — SENNA-DOCUSATE SODIUM 8.6-50 MG PO TABS: 2.0000 | ORAL_TABLET | Freq: Every day | ORAL | Status: DC

## 2014-01-26 MED ORDER — EPHEDRINE SULFATE 50 MG/ML IJ SOLN
INTRAMUSCULAR | Status: AC
  Filled 2014-01-26: qty 1

## 2014-01-26 MED ORDER — PHENYLEPHRINE HCL 10 MG/ML IJ SOLN
10.0000 mg | INTRAVENOUS | Status: DC | PRN
  Administered 2014-01-26: 20 ug/min via INTRAVENOUS
  Administered 2014-01-26: 10:00:00 via INTRAVENOUS

## 2014-01-26 MED ORDER — ONDANSETRON HCL 4 MG/2ML IJ SOLN
INTRAMUSCULAR | Status: DC | PRN
  Administered 2014-01-26: 4 mg via INTRAVENOUS

## 2014-01-26 MED ORDER — FENTANYL CITRATE 0.05 MG/ML IJ SOLN
INTRAMUSCULAR | Status: AC
  Filled 2014-01-26: qty 5

## 2014-01-26 MED ORDER — FENTANYL CITRATE 0.05 MG/ML IJ SOLN
INTRAMUSCULAR | Status: DC | PRN
  Administered 2014-01-26 (×3): 50 ug via INTRAVENOUS

## 2014-01-26 MED ORDER — POLYETHYLENE GLYCOL 3350 17 G PO PACK: 17.0000 g | PACK | Freq: Every day | ORAL | Status: DC | PRN

## 2014-01-26 MED ORDER — PROPOFOL 10 MG/ML IV BOLUS
INTRAVENOUS | Status: DC | PRN
Start: 2014-01-26 — End: 2014-01-26
  Administered 2014-01-26: 100 mg via INTRAVENOUS

## 2014-01-26 MED ORDER — HYDROMORPHONE HCL PF 1 MG/ML IJ SOLN
0.5000 mg | INTRAMUSCULAR | Status: DC | PRN
  Administered 2014-01-27 (×3): 1 mg via INTRAVENOUS
  Filled 2014-01-26 (×3): qty 1

## 2014-01-26 MED ORDER — ASPIRIN 81 MG PO TABS: 81.0000 mg | ORAL_TABLET | Freq: Every day | ORAL | Status: DC

## 2014-01-26 MED ORDER — LINAGLIPTIN 5 MG PO TABS
5.0000 mg | ORAL_TABLET | Freq: Every day | ORAL | Status: DC
  Administered 2014-01-26 – 2014-01-28 (×3): 5 mg via ORAL
  Filled 2014-01-26 (×3): qty 1

## 2014-01-26 MED ORDER — OXYCODONE HCL 5 MG/5ML PO SOLN: 5.0000 mg | Freq: Once | ORAL | Status: DC | PRN

## 2014-01-26 MED ORDER — METHOCARBAMOL 500 MG PO TABS
500.0000 mg | ORAL_TABLET | Freq: Four times a day (QID) | ORAL | Status: DC | PRN
  Administered 2014-01-26 – 2014-01-27 (×3): 500 mg via ORAL
  Filled 2014-01-26 (×3): qty 1

## 2014-01-26 MED ORDER — BUPIVACAINE-EPINEPHRINE (PF) 0.5% -1:200000 IJ SOLN
INTRAMUSCULAR | Status: DC | PRN
  Administered 2014-01-26: 30 mL via PERINEURAL

## 2014-01-26 MED ORDER — HYDROCODONE-ACETAMINOPHEN 10-325 MG PO TABS: 1.0000 | ORAL_TABLET | Freq: Four times a day (QID) | ORAL | Status: DC | PRN

## 2014-01-26 MED ORDER — MIDAZOLAM HCL 2 MG/2ML IJ SOLN
INTRAMUSCULAR | Status: AC
  Filled 2014-01-26: qty 2

## 2014-01-26 MED ORDER — OXYCODONE HCL 5 MG PO TABS: 5.0000 mg | ORAL_TABLET | Freq: Once | ORAL | Status: DC | PRN

## 2014-01-26 MED ORDER — HYDROCODONE-ACETAMINOPHEN 10-325 MG PO TABS
1.0000 | ORAL_TABLET | ORAL | Status: DC | PRN
  Administered 2014-01-26 – 2014-01-27 (×3): 2 via ORAL
  Filled 2014-01-26 (×4): qty 2

## 2014-01-26 MED ORDER — CARVEDILOL 6.25 MG PO TABS
6.2500 mg | ORAL_TABLET | Freq: Two times a day (BID) | ORAL | Status: DC
  Administered 2014-01-26 – 2014-01-28 (×4): 6.25 mg via ORAL
  Filled 2014-01-26 (×7): qty 1

## 2014-01-26 MED ORDER — INSULIN GLARGINE 100 UNIT/ML ~~LOC~~ SOLN
32.0000 [IU] | Freq: Every day | SUBCUTANEOUS | Status: DC
  Administered 2014-01-26 – 2014-01-28 (×3): 32 [IU] via SUBCUTANEOUS
  Filled 2014-01-26 (×3): qty 0.32

## 2014-01-26 MED ORDER — ONDANSETRON HCL 4 MG/2ML IJ SOLN
4.0000 mg | Freq: Once | INTRAMUSCULAR | Status: AC | PRN
  Administered 2014-01-26: 4 mg via INTRAVENOUS

## 2014-01-26 MED ORDER — NEOSTIGMINE METHYLSULFATE 10 MG/10ML IV SOLN
INTRAVENOUS | Status: DC | PRN
  Administered 2014-01-26: 4 mg via INTRAVENOUS

## 2014-01-26 MED ORDER — ONDANSETRON HCL 4 MG PO TABS
4.0000 mg | ORAL_TABLET | Freq: Four times a day (QID) | ORAL | Status: DC | PRN
  Administered 2014-01-28: 4 mg via ORAL
  Filled 2014-01-26 (×2): qty 1

## 2014-01-26 MED ORDER — METOCLOPRAMIDE HCL 5 MG/ML IJ SOLN
5.0000 mg | Freq: Three times a day (TID) | INTRAMUSCULAR | Status: DC | PRN
  Administered 2014-01-27: 10 mg via INTRAVENOUS
  Filled 2014-01-26: qty 2

## 2014-01-26 MED ORDER — METOCLOPRAMIDE HCL 10 MG PO TABS: 5.0000 mg | ORAL_TABLET | Freq: Three times a day (TID) | ORAL | Status: DC | PRN

## 2014-01-26 MED ORDER — MENTHOL 3 MG MT LOZG: 1.0000 | LOZENGE | OROMUCOSAL | Status: DC | PRN

## 2014-01-26 MED ORDER — LIDOCAINE HCL (CARDIAC) 20 MG/ML IV SOLN
INTRAVENOUS | Status: AC
  Filled 2014-01-26: qty 5

## 2014-01-26 MED ORDER — INSULIN ASPART 100 UNIT/ML ~~LOC~~ SOLN
0.0000 [IU] | Freq: Three times a day (TID) | SUBCUTANEOUS | Status: DC
  Administered 2014-01-26 – 2014-01-28 (×4): 3 [IU] via SUBCUTANEOUS

## 2014-01-26 MED ORDER — MEPERIDINE HCL 25 MG/ML IJ SOLN: 6.2500 mg | INTRAMUSCULAR | Status: DC | PRN

## 2014-01-26 MED ORDER — METHOCARBAMOL 1000 MG/10ML IJ SOLN: 500.0000 mg | Freq: Four times a day (QID) | INTRAVENOUS | Status: DC | PRN

## 2014-01-26 MED ORDER — GLYCOPYRROLATE 0.2 MG/ML IJ SOLN
INTRAMUSCULAR | Status: AC
  Filled 2014-01-26: qty 3

## 2014-01-26 MED ORDER — PHENOL 1.4 % MT LIQD: 1.0000 | OROMUCOSAL | Status: DC | PRN

## 2014-01-26 MED ORDER — ONDANSETRON HCL 4 MG/2ML IJ SOLN
4.0000 mg | Freq: Four times a day (QID) | INTRAMUSCULAR | Status: DC | PRN
  Administered 2014-01-27: 4 mg via INTRAVENOUS
  Filled 2014-01-26: qty 2

## 2014-01-26 MED ORDER — GLYCOPYRROLATE 0.2 MG/ML IJ SOLN
INTRAMUSCULAR | Status: DC | PRN
  Administered 2014-01-26: .6 mg via INTRAVENOUS

## 2014-01-26 MED ORDER — STERILE WATER FOR INJECTION IJ SOLN
INTRAMUSCULAR | Status: AC
  Filled 2014-01-26: qty 10

## 2014-01-26 MED ORDER — RAMIPRIL 10 MG PO CAPS
10.0000 mg | ORAL_CAPSULE | Freq: Every day | ORAL | Status: DC
  Administered 2014-01-27 – 2014-01-28 (×2): 10 mg via ORAL
  Filled 2014-01-26 (×2): qty 1

## 2014-01-26 MED ORDER — POTASSIUM CHLORIDE IN NACL 20-0.45 MEQ/L-% IV SOLN
INTRAVENOUS | Status: DC
  Administered 2014-01-26: 13:00:00 via INTRAVENOUS
  Filled 2014-01-26 (×5): qty 1000

## 2014-01-26 MED ORDER — BISACODYL 10 MG RE SUPP: 10.0000 mg | Freq: Every day | RECTAL | Status: DC | PRN

## 2014-01-26 MED ORDER — SUCCINYLCHOLINE CHLORIDE 20 MG/ML IJ SOLN
INTRAMUSCULAR | Status: AC
  Filled 2014-01-26: qty 1

## 2014-01-26 MED ORDER — ASPIRIN 81 MG PO CHEW
81.0000 mg | CHEWABLE_TABLET | Freq: Every day | ORAL | Status: DC
  Administered 2014-01-27 – 2014-01-28 (×2): 81 mg via ORAL
  Filled 2014-01-26 (×2): qty 1

## 2014-01-26 MED ORDER — ONDANSETRON HCL 4 MG/2ML IJ SOLN
INTRAMUSCULAR | Status: AC
  Filled 2014-01-26: qty 2

## 2014-01-26 MED ORDER — BACLOFEN 10 MG PO TABS: 10.0000 mg | ORAL_TABLET | Freq: Three times a day (TID) | ORAL | Status: DC

## 2014-01-26 MED ORDER — DIPHENHYDRAMINE HCL 12.5 MG/5ML PO ELIX: 12.5000 mg | ORAL_SOLUTION | ORAL | Status: DC | PRN

## 2014-01-26 MED ORDER — CEFAZOLIN SODIUM 1-5 GM-% IV SOLN
1.0000 g | Freq: Four times a day (QID) | INTRAVENOUS | Status: AC
  Administered 2014-01-26 – 2014-01-27 (×3): 1 g via INTRAVENOUS
  Filled 2014-01-26 (×3): qty 50

## 2014-01-26 MED ORDER — PROPOFOL 10 MG/ML IV BOLUS
INTRAVENOUS | Status: AC
  Filled 2014-01-26: qty 20

## 2014-01-26 MED ORDER — SIMVASTATIN 20 MG PO TABS
20.0000 mg | ORAL_TABLET | Freq: Every day | ORAL | Status: DC
  Administered 2014-01-26 – 2014-01-27 (×2): 20 mg via ORAL
  Filled 2014-01-26 (×3): qty 1

## 2014-01-26 MED ORDER — HYDROMORPHONE HCL PF 1 MG/ML IJ SOLN: 0.2500 mg | INTRAMUSCULAR | Status: DC | PRN

## 2014-01-26 MED ORDER — ACETAMINOPHEN 325 MG PO TABS
650.0000 mg | ORAL_TABLET | Freq: Four times a day (QID) | ORAL | Status: DC | PRN
  Administered 2014-01-27: 650 mg via ORAL
  Filled 2014-01-26: qty 2

## 2014-01-26 MED ORDER — ROCURONIUM BROMIDE 100 MG/10ML IV SOLN
INTRAVENOUS | Status: DC | PRN
  Administered 2014-01-26: 50 mg via INTRAVENOUS

## 2014-01-26 MED ORDER — LIDOCAINE HCL (CARDIAC) 20 MG/ML IV SOLN
INTRAVENOUS | Status: DC | PRN
  Administered 2014-01-26: 100 mg via INTRAVENOUS

## 2014-01-26 MED ORDER — MIDAZOLAM HCL 5 MG/5ML IJ SOLN
INTRAMUSCULAR | Status: DC | PRN
  Administered 2014-01-26 (×2): 1 mg via INTRAVENOUS

## 2014-01-26 MED ORDER — SODIUM CHLORIDE 0.9 % IR SOLN
Status: DC | PRN
  Administered 2014-01-26: 3000 mL

## 2014-01-26 MED ORDER — DOCUSATE SODIUM 100 MG PO CAPS
100.0000 mg | ORAL_CAPSULE | Freq: Two times a day (BID) | ORAL | Status: DC
  Administered 2014-01-26 – 2014-01-27 (×3): 100 mg via ORAL
  Filled 2014-01-26 (×4): qty 1

## 2014-01-26 MED ORDER — ALUM & MAG HYDROXIDE-SIMETH 200-200-20 MG/5ML PO SUSP: 30.0000 mL | ORAL | Status: DC | PRN

## 2014-01-26 MED ORDER — LACTATED RINGERS IV SOLN
INTRAVENOUS | Status: DC | PRN
  Administered 2014-01-26 (×2): via INTRAVENOUS

## 2014-01-26 SURGICAL SUPPLY — 62 items
BENZOIN TINCTURE PRP APPL 2/3 (GAUZE/BANDAGES/DRESSINGS) ×3 IMPLANT
BLADE SAW SGTL MED 73X18.5 STR (BLADE) ×3 IMPLANT
BOOTCOVER CLEANROOM LRG (PROTECTIVE WEAR) ×6 IMPLANT
BOWL SMART MIX CTS (DISPOSABLE) IMPLANT
BRUSH FEMORAL CANAL (MISCELLANEOUS) IMPLANT
CAP TOTAL SHOULDER ×3 IMPLANT
CEMENT BONE DEPUY (Cement) ×3 IMPLANT
CLOSURE STERI-STRIP 1/2X4 (GAUZE/BANDAGES/DRESSINGS) ×1
CLSR STERI-STRIP ANTIMIC 1/2X4 (GAUZE/BANDAGES/DRESSINGS) ×2 IMPLANT
COVER SURGICAL LIGHT HANDLE (MISCELLANEOUS) ×3 IMPLANT
COVER TABLE BACK 60X90 (DRAPES) IMPLANT
DRAPE C-ARM 42X72 X-RAY (DRAPES) IMPLANT
DRAPE INCISE IOBAN 66X45 STRL (DRAPES) ×3 IMPLANT
DRAPE U-SHAPE 47X51 STRL (DRAPES) ×3 IMPLANT
DRSG MEPILEX BORDER 4X8 (GAUZE/BANDAGES/DRESSINGS) ×3 IMPLANT
DRSG PAD ABDOMINAL 8X10 ST (GAUZE/BANDAGES/DRESSINGS) ×3 IMPLANT
DURAPREP 26ML APPLICATOR (WOUND CARE) ×3 IMPLANT
ELECT BLADE 6.5 EXT (BLADE) IMPLANT
ELECT NEEDLE TIP 2.8 STRL (NEEDLE) IMPLANT
ELECT REM PT RETURN 9FT ADLT (ELECTROSURGICAL) ×3
ELECTRODE REM PT RTRN 9FT ADLT (ELECTROSURGICAL) ×1 IMPLANT
EVACUATOR 1/8 PVC DRAIN (DRAIN) IMPLANT
FACESHIELD WRAPAROUND (MASK) IMPLANT
GLOVE BIOGEL PI ORTHO PRO SZ8 (GLOVE) ×4
GLOVE ORTHO TXT STRL SZ7.5 (GLOVE) ×3 IMPLANT
GLOVE PI ORTHO PRO STRL SZ8 (GLOVE) ×2 IMPLANT
GLOVE SURG ORTHO 8.0 STRL STRW (GLOVE) ×6 IMPLANT
GOWN BRE IMP PREV XXLGXLNG (GOWN DISPOSABLE) ×3 IMPLANT
GOWN STRL REUS W/ TWL XL LVL3 (GOWN DISPOSABLE) ×1 IMPLANT
GOWN STRL REUS W/TWL XL LVL3 (GOWN DISPOSABLE) ×2
HANDPIECE INTERPULSE COAX TIP (DISPOSABLE) ×2
HOOD PEEL AWAY FACE SHEILD DIS (HOOD) ×6 IMPLANT
KIT BASIN OR (CUSTOM PROCEDURE TRAY) ×3 IMPLANT
KIT ROOM TURNOVER OR (KITS) ×3 IMPLANT
MANIFOLD NEPTUNE II (INSTRUMENTS) ×3 IMPLANT
NEEDLE 1/2 CIR CATGUT .05X1.09 (NEEDLE) ×3 IMPLANT
NEEDLE HYPO 25GX1X1/2 BEV (NEEDLE) ×3 IMPLANT
NS IRRIG 1000ML POUR BTL (IV SOLUTION) ×3 IMPLANT
PACK SHOULDER (CUSTOM PROCEDURE TRAY) ×3 IMPLANT
PAD ARMBOARD 7.5X6 YLW CONV (MISCELLANEOUS) ×6 IMPLANT
SET HNDPC FAN SPRY TIP SCT (DISPOSABLE) ×1 IMPLANT
SLING ARM IMMOBILIZER LRG (SOFTGOODS) IMPLANT
SLING ARM IMMOBILIZER MED (SOFTGOODS) IMPLANT
SMARTMIX MINI TOWER (MISCELLANEOUS)
SPONGE GAUZE 4X4 12PLY (GAUZE/BANDAGES/DRESSINGS) ×3 IMPLANT
SPONGE LAP 18X18 X RAY DECT (DISPOSABLE) ×3 IMPLANT
SUCTION FRAZIER TIP 10 FR DISP (SUCTIONS) ×3 IMPLANT
SUPPORT WRAP ARM LG (MISCELLANEOUS) ×3 IMPLANT
SUT FIBERWIRE #2 38 REV NDL BL (SUTURE) ×15
SUT MNCRL AB 4-0 PS2 18 (SUTURE) IMPLANT
SUT VIC AB 0 CT1 27 (SUTURE) ×2
SUT VIC AB 0 CT1 27XBRD ANBCTR (SUTURE) ×1 IMPLANT
SUT VIC AB 2-0 CT1 27 (SUTURE)
SUT VIC AB 2-0 CT1 TAPERPNT 27 (SUTURE) IMPLANT
SUT VIC AB 3-0 SH 8-18 (SUTURE) ×3 IMPLANT
SUTURE FIBERWR#2 38 REV NDL BL (SUTURE) ×5 IMPLANT
SYR CONTROL 10ML LL (SYRINGE) ×3 IMPLANT
TOWEL OR 17X24 6PK STRL BLUE (TOWEL DISPOSABLE) ×3 IMPLANT
TOWEL OR 17X26 10 PK STRL BLUE (TOWEL DISPOSABLE) ×3 IMPLANT
TOWER SMARTMIX MINI (MISCELLANEOUS) IMPLANT
TRAY FOLEY CATH 16FRSI W/METER (SET/KITS/TRAYS/PACK) IMPLANT
WATER STERILE IRR 1000ML POUR (IV SOLUTION) ×3 IMPLANT

## 2014-01-26 NOTE — Anesthesia Postprocedure Evaluation (Signed)
Anesthesia Post Note  Patient: Norman Marks  Procedure(s) Performed: Procedure(s) (LRB): TOTAL SHOULDER ARTHROPLASTY (Right)  Anesthesia type: general  Patient location: PACU  Post pain: Pain level controlled  Post assessment: Patient's Cardiovascular Status Stable  Last Vitals:  Filed Vitals:   01/26/14 1130  BP: 109/53  Pulse: 83  Temp: 36.4 C  Resp: 18    Post vital signs: Reviewed and stable  Level of consciousness: sedated  Complications: No apparent anesthesia complications

## 2014-01-26 NOTE — Transfer of Care (Signed)
Immediate Anesthesia Transfer of Care Note  Patient: Norman Marks  Procedure(s) Performed: Procedure(s): TOTAL SHOULDER ARTHROPLASTY (Right)  Patient Location: PACU  Anesthesia Type:General and Regional  Level of Consciousness: awake, alert  and oriented  Airway & Oxygen Therapy: Patient Spontanous Breathing and Patient connected to nasal cannula oxygen  Post-op Assessment: Report given to PACU RN and Post -op Vital signs reviewed and stable  Post vital signs: Reviewed and stable  Complications: No apparent anesthesia complications

## 2014-01-26 NOTE — Progress Notes (Addendum)
PT Cancellation Note  Patient Details Name: Norman Marks MRN: [id number] DOB: 06/22/1946   Cancelled Treatment:    Reason Eval/Treat Not Completed: PT screened, no needs identified, will sign off  Pt reports no difficulties with balance and mobility. States he was fully independent prior to surgery, did not need an assistive device, and reports he will not need any acute physical therapy treatment. PT is signing-off. Thank you for this referral. For any changes in patient status please re-order PT.   [name], [name]  [name] 01/26/2014, 4:20 PM  Addendum for PT screen

## 2014-01-26 NOTE — H&P (Signed)
PREOPERATIVE H&P  Chief Complaint: RIGHT SHOULDER DJD  HPI: Norman Marks is a 68 y.o. male who presents for preoperative history and physical with a diagnosis of RIGHT SHOULDER DJD. Symptoms are rated as moderate to severe, and have been worsening.  This is significantly impairing activities of daily living.  He has elected for surgical management. He has failed injections, activity modifications, anti-inflammatories, cannot tolerate oxycodone, and has elected for surgical management. Preoperative x-rays demonstrate moderate degenerative changes, MRIs demonstrate significant chondral loss.  Past Medical History  Diagnosis Date  . History of chicken pox   . Diabetes type 2, uncontrolled   . Hx of migraines   . BPH (benign prostatic hypertrophy)   . HTN (hypertension)     mild  . HLD (hyperlipidemia)   . CAD (coronary artery disease) 2009    [name] (cardiolite 2009 with 70% stenosis of proximal ramus, not amenable to PCI, rec treat medically)  . Osteopenia 06/2011    T score -3, completed 2 yrs forteo, T score improved to -2.2 (2015)  . History of nephrolithiasis 2012    L ureter, R renal pelvis  . DDD (degenerative disc disease), lumbar     s/p surgery  . Vitamin D deficiency   . History of bundle branch block   . History of adenomatous polyp of colon 10/2012  . CMC arthritis, thumb, degenerative 2014    s/p injections (Sypher)  . Arthritis of shoulder region, right, degenerative 2015    s/p injections (Sypher)  . PONV (postoperative nausea and vomiting)   . Heart murmur   . Dysrhythmia   . Diabetes mellitus without complication   . Headache(784.0)   . Cancer     basal cell skin cancer   Past Surgical History  Procedure Laterality Date  . Back surgery  1994, 2008    lumbar; x 3, spinal fusion  . Knee surgery Right   . Shoulder surgery Right   . US echocardiography  10/2008    nl LV fxn, EF 50%, mild MR, mild pulm HTN  . Skin surgery  2006    skin cancer  .  Cardiovascular stress test  2011    fixed defect inferior/septal walls without reversibility, LVEF 43% post stress  . Dexa  06/2011    osteoporosis T -2.6 spine, -1.9 hip  . Flexible sigmoidoscopy  2007    pt reported normal ([place])  . Colonoscopy  10/2012    tubular adenoma, mild diverticulosis, rpt 5 yrs Deatra Ina)  . Dexa  07/2013    osteopenia T -2.2 femur, -1.2 spine  . Cardiac catheterization  2009    70% stenosis ostium   History   Social History  . Marital Status: Married    Spouse Name: N/A    Number of Children: N/A  . Years of Education: N/A   Social History Main Topics  . Smoking status: Former Smoker -- 0.25 packs/day for 10 years    Types: Cigarettes    Quit date: 10/20/1992  . Smokeless tobacco: Former Systems developer    Types: [place] date: 10/20/1992  . Alcohol Use: No  . Drug Use: No  . Sexual Activity: None   Other Topics Concern  . None   Social History Narrative   Caffeine: 1 cup coffee/day   Lives with wife, 1 dog, 5 cats.   Occupation: retired   Edu: 12th grade   Activity: no regular exercise   Diet: good water, fruits/vegetables daily   Family History  Problem Relation Age of Onset  . Cancer Father     lung and prostate  . Diabetes Father   . Heart disease Mother   . Cancer Mother     unsure  . Coronary artery disease Sister 31  . Heart attack Sister 48  . Diabetes Brother   . Stroke Neg Hx   . Colon cancer Neg Hx    Allergies  Allergen Reactions  . Percocet [Oxycodone-Acetaminophen] Itching   Prior to Admission medications   Medication Sig Start Date End Date Taking? Authorizing Provider  aspirin 81 MG tablet Take 81 mg by mouth daily.   Yes Historical Provider, MD  BAYER CONTOUR TEST test strip USE TO TEST SUGAR ONCE TO TWICE A DAY AS DIRECTED 12/18/13  Yes Ria Bush, MD  Calcium Carb-Cholecalciferol (CALCIUM-VITAMIN D) 600-400 MG-UNIT TABS Take 1 tablet by mouth daily.    Yes Historical Provider, MD  carvedilol (COREG) 6.25 MG  tablet Take 1 tablet (6.25 mg total) by mouth 2 (two) times daily with a meal. 12/18/13  Yes Ria Bush, MD  Cholecalciferol (VITAMIN D) 2000 UNITS tablet Take 1 tablet (2,000 Units total) by mouth daily. 08/11/12  Yes Ria Bush, MD  glucosamine-chondroitin (MAX GLUCOSAMINE CHONDROITIN) 500-400 MG tablet Take 1 tablet by mouth 2 (two) times daily. 07/04/13  Yes Ria Bush, MD  ibuprofen (ADVIL,MOTRIN) 200 MG tablet Take 400 mg by mouth daily as needed for moderate pain.    Yes Historical Provider, MD  insulin glargine (LANTUS) 100 UNIT/ML injection Inject 0.32 mLs (32 Units total) into the skin daily. 12/19/13  Yes Ria Bush, MD  Insulin Pen Needle (EXEL PEN NEEDLES 31GX1/4") 31G X 6 MM MISC Use as directed 02/08/12  Yes Ria Bush, MD  Insulin Syringe-Needle U-100 31G X 5/16" 0.3 ML MISC Use to inject insulin as directed. 05/06/13  Yes Ria Bush, MD  lovastatin (MEVACOR) 40 MG tablet Take 1 tablet (40 mg total) by mouth at bedtime. 09/16/13  Yes Ria Bush, MD  metFORMIN (GLUCOPHAGE) 1000 MG tablet Take 1 tablet (1,000 mg total) by mouth daily with breakfast. 09/16/13  Yes Ria Bush, MD  ramipril (ALTACE) 10 MG capsule Take 1 capsule (10 mg total) by mouth daily. 09/16/13  Yes Ria Bush, MD  sitaGLIPtin (JANUVIA) 100 MG tablet Take 1 tablet (100 mg total) by mouth daily. 09/16/13  Yes Ria Bush, MD     Positive ROS: All other systems have been reviewed and were otherwise negative with the exception of those mentioned in the HPI and as above.  Physical Exam: General: Alert, no acute distress Cardiovascular: No pedal edema Respiratory: No cyanosis, no use of accessory musculature GI: No organomegaly, abdomen is soft and non-tender Skin: No lesions in the area of chief complaint Neurologic: Sensation intact distally Psychiatric: Patient is competent for consent with normal mood and affect Lymphatic: No axillary or cervical  lymphadenopathy  MUSCULOSKELETAL: Right shoulder active motion is 0-170, external rotation is to 30, cuff strength is intact. Positive pain with range of motion.  Assessment: RIGHT SHOULDER DJD  Plan: Plan for Procedure(s): TOTAL SHOULDER ARTHROPLASTY  The risks benefits and alternatives were discussed with the patient including but not limited to the risks of nonoperative treatment, versus surgical intervention including infection, bleeding, nerve injury,  blood clots, cardiopulmonary complications, morbidity, mortality, among others, and they were willing to proceed.   Johnny Bridge, MD Cell [phone]   01/26/2014 7:19 AM

## 2014-01-26 NOTE — Op Note (Addendum)
01/26/2014  9:50 AM  PATIENT:  Norman Marks    PRE-OPERATIVE DIAGNOSIS:  RIGHT SHOULDER DJD  POST-OPERATIVE DIAGNOSIS:  Same  PROCEDURE:  TOTAL SHOULDER ARTHROPLASTY  SURGEON:  Johnny Bridge, MD  First ASSISTANT: Edmonia Lynch, M.D., present and scrubbed throughout the case, critical for completion in a timely fashion, and for retraction, instrumentation, and closure.  Second assistant: Lemar Lofty, PA-C student  ANESTHESIA:   General  PREOPERATIVE INDICATIONS:  Norman Marks is a  68 y.o. male with a diagnosis of RIGHT SHOULDER DJD who failed conservative measures and elected for surgical management.    The risks benefits and alternatives were discussed with the patient preoperatively including but not limited to the risks of infection, bleeding, nerve injury, cardiopulmonary complications, the need for revision surgery, dislocation, loosening, incomplete relief of pain, among others, and the patient was willing to proceed.   OPERATIVE IMPLANTS: Biomet size 11 mini press-fit humeral stem, size 46 +18 Versa-dial humeral head, set in the E position with increased coverage posteriorly, with a medium cemented glenoid polyethylene 3 peg implant with a central regenerex noncemented post.   OPERATIVE FINDINGS: Advanced glenohumeral osteoarthritis involving the glenoid and the humeral head with moderate osteophyte formation inferiorly. Preoperatively his x-rays did not demonstrate end-stage cartilage loss, but he had severe symptoms, whereas the actual operative findings did demonstrate full thickness chondral loss on both the humeral head and glenoid with rimming osteophytes inferiorly.   OPERATIVE PROCEDURE: The patient was brought to the operating room and placed in the supine position. General anesthesia was administered. IV antibiotics were given.  The upper extremity was prepped and draped in usual sterile fashion. The patient was in a beachchair position with all bony  prominences padded.   Time out was performed and a deltopectoral approach was carried out. The biceps tendon was tenodesed to the pectoralis tendon. The subscapularis was released, tagging it with a #2 MaxBraid, leaving a cuff of tendon for repair.   The inferior osteophyte was removed, and release of the capsule off of the humeral side was completed. The head was dislocated, and I reamed sequentially. I placed the humeral cutting guide at 30 of retroversion, and then pinned this into place, and made my humeral neck cut. This was at the appropriate level. There is a little bit of posterior cartilage noted, and I suspect that his native version was even more than 30, because I made the humeral cut at 30, and it appeared like the appropriate reconstructive platform, so I did not cut a deeper more retroverted cut.  I then placed deep retractors and exposed the glenoid. I excised the labrum circumferentially, taking care to protect the axillary nerve inferiorly.   I then placed a guidewire into the center position, controlling appropriate version and inclination. I then reamed over the guidewire with the small reamer, and was satisfied with the preparation. I preserved the subchondral bone in order to maximize the strength and minimize the risk for subsequent subsidence.   I then drilled the central hole for the regenerex peg, and then placed the guide, and then drilled the 3 peripheral peg holes. I had excellent bony circumferential contact.   I then cleaned the glenoid, irrigated it copiously, and then dried it and cemented the prosthesis into place. Excellent seating was achieved. I had full exposure. The cement cured, and then I turned my attention to the humeral side.   I sequentially broached, up to the selected size, with the broach set at 30 of  retroversion. I then placed the real stem. I trialed with multiple heads, and the above-named component was selected. Increased posterior coverage  improved the coverage. The soft tissue tension was appropriate.   I then impacted the real humeral head into place, reduced the head, and irrigated copiously. Excellent stability and range of motion was achieved. I repaired the subscapularis with 4 #2 Fiberwire, as well as the rotator interval, and irrigated copiously once more. The subcutaneous tissue was closed with Vicryl including the deltopectoral fascia.   The skin was closed with Steri-Strips and sterile gauze was applied. He had a preoperative nerve block. He tolerated the procedure well and there were no complications.

## 2014-01-26 NOTE — Discharge Instructions (Signed)
Diet: As you were doing prior to hospitalization  ° °Shower:  May shower but keep the wounds dry, use an occlusive plastic wrap, NO SOAKING IN TUB.  If the bandage gets wet, change with a clean dry gauze. ° °Dressing:  You may change your dressing 3-5 days after surgery.  Then change the dressing daily with sterile gauze dressing.   ° °There are sticky tapes (steri-strips) on your wounds and all the stitches are absorbable.  Leave the steri-strips in place when changing your dressings, they will peel off with time, usually 2-3 weeks. ° °Activity:  Increase activity slowly as tolerated, but follow the weight bearing instructions below.  No lifting or driving for 6 weeks. ° °Weight Bearing:   Sling at all times..   ° °To prevent constipation: you may use a stool softener such as - ° °Colace (over the counter) 100 mg by mouth twice a day  °Drink plenty of fluids (prune juice may be helpful) and high fiber foods °Miralax (over the counter) for constipation as needed.   ° °Itching:  If you experience itching with your medications, try taking only a single pain pill, or even half a pain pill at a time.  You may take up to 10 pain pills per day, and you can also use benadryl over the counter for itching or also to help with sleep.  ° °Precautions:  If you experience chest pain or shortness of breath - call 911 immediately for transfer to the hospital emergency department!! ° °If you develop a fever greater that 101 F, purulent drainage from wound, increased redness or drainage from wound, or calf pain -- Call the office at [phone]                                                °Follow- Up Appointment:  Please call for an appointment to be seen in 2 weeks [place] - [phone] ° ° ° ° ° °

## 2014-01-26 NOTE — Progress Notes (Signed)
Utilization review completed.  

## 2014-01-26 NOTE — Anesthesia Preprocedure Evaluation (Addendum)
Anesthesia Evaluation  Patient identified by MRN, date of birth, ID band Patient awake    Reviewed: Allergy & Precautions, H&P , NPO status , Patient's Chart, lab work & pertinent test results, reviewed documented beta blocker date and time   History of Anesthesia Complications (+) PONV and history of anesthetic complications  Airway Mallampati: II TM Distance: >3 FB Neck ROM: Full    Dental  (+) Dental Advisory Given, Teeth Intact, Partial Upper, Partial Lower   Pulmonary former smoker,          Cardiovascular hypertension, Pt. on medications and Pt. on home beta blockers + CAD + dysrhythmias     Neuro/Psych  Headaches,    GI/Hepatic   Endo/Other  diabetes, Type 2, Insulin Dependent, Oral Hypoglycemic Agents  Renal/GU      Musculoskeletal  (+) Arthritis -,   Abdominal   Peds  Hematology   Anesthesia Other Findings   Reproductive/Obstetrics                         Anesthesia Physical Anesthesia Plan  ASA: III  Anesthesia Plan: General   Post-op Pain Management:    Induction: Intravenous  Airway Management Planned: Oral ETT  Additional Equipment:   Intra-op Plan:   Post-operative Plan: Extubation in OR  Informed Consent: I have reviewed the patients History and Physical, chart, labs and discussed the procedure including the risks, benefits and alternatives for the proposed anesthesia with the patient or authorized representative who has indicated his/her understanding and acceptance.   Dental advisory given  Plan Discussed with: CRNA and Surgeon  Anesthesia Plan Comments:       Anesthesia Quick Evaluation

## 2014-01-26 NOTE — Evaluation (Signed)
Occupational Therapy Evaluation Patient Details Name: Norman Marks MRN: [id number] DOB: February 19, 1946 Today's Date: 01/26/2014    History of Present Illness 68 y.o. s/p Right TSA.   Clinical Impression   Pt s/p above. Pt independent with ADLs, PTA. Feel pt will benefit from acute OT prior to d/c to review elbow, wrist, hand exercises and to increase independence.     Follow Up Recommendations  No OT follow up;Supervision - Intermittent    Equipment Recommendations  None recommended by OT    Recommendations for Other Services       Precautions / Restrictions Precautions Precautions: Shoulder Type of Shoulder Precautions: no shoulder movement Shoulder Interventions: Shoulder sling/immobilizer;Off for dressing/bathing/exercises Precaution Booklet Issued: Yes (comment) Precaution Comments: Reviewed precautions with pt/spouse Required Braces or Orthoses: Sling Restrictions Weight Bearing Restrictions: Yes RUE Weight Bearing: Non weight bearing      Mobility Bed Mobility Overal bed mobility: Needs Assistance Bed Mobility: Supine to Sit;Sit to Supine     Supine to sit: Min assist Sit to supine: Min guard   General bed mobility comments: assistance to support RUE  Transfers Overall transfer level: Needs assistance   Transfers: Sit to/from Stand Sit to Stand: Min guard              Balance                                            ADL Overall ADL's : Needs assistance/impaired                 Upper Body Dressing : Minimal assistance;Sitting (Min A to assist spouse)       Toilet Transfer: Min guard           Functional mobility during ADLs: Min guard General ADL Comments: Education provided to pt/spouse on shoulder information on handout. Practiced donning/doffing sling.     Vision                     Perception     Praxis      Pertinent Vitals/Pain No pain reported.      Hand Dominance Right    Extremity/Trunk Assessment Upper Extremity Assessment Upper Extremity Assessment: RUE deficits/detail RUE Deficits / Details: TSA RUE Sensation: decreased light touch   Lower Extremity Assessment Lower Extremity Assessment: Overall WFL for tasks assessed       Communication Communication Communication: No difficulties   Cognition Arousal/Alertness: Awake/alert Behavior During Therapy: WFL for tasks assessed/performed Overall Cognitive Status: Within Functional Limits for tasks assessed                     General Comments       Exercises Exercises: Shoulder;Other exercises Other Exercises Other Exercises: Educated on retrograde massage for Rt hand and demonstrated briefly and also performed a few reps of PROM digit composite flexion/extension.   Shoulder Instructions Shoulder Instructions Donning/doffing shirt without moving shoulder: Caregiver independent with task Method for sponge bathing under operated UE: Patient able to independently direct caregiver Donning/doffing sling/immobilizer: Supervision/safety Correct positioning of sling/immobilizer:  (pt able to verbalize; min A to assist spouse) ROM for elbow, wrist and digits of operated UE:  (pt able to verbalize) Sling wearing schedule (on at all times/off for ADL's): Patient able to independently direct caregiver Proper positioning of operated UE when showering:  (educated) Positioning of UE while sleeping:  Patient able to independently direct caregiver    Home Living Family/patient expects to be discharged to:: Private residence Living Arrangements: Spouse/significant other Available Help at Discharge: Family Type of Home: Mobile home Home Access: Ramped entrance     [place]: One level     Bathroom Shower/Tub: Occupational psychologist: Standard (with toilet riser)     Home Equipment: Toilet riser;Shower seat          Prior Functioning/Environment Level of Independence: Independent              OT Diagnosis: Acute pain   OT Problem List: Impaired sensation;Decreased knowledge of use of DME or AE;Decreased knowledge of precautions;Impaired UE functional use   OT Treatment/Interventions: Self-care/ADL training;Therapeutic exercise;DME and/or AE instruction;Therapeutic activities;Patient/family education;Balance training    OT Goals(Current goals can be found in the care plan section) Acute Rehab OT Goals Patient Stated Goal: not stated OT Goal Formulation: With patient Time For Goal Achievement: 02/02/14 Potential to Achieve Goals: Good ADL Goals Pt Will Transfer to Toilet: with modified independence;ambulating (comfort height toilet) Additional ADL Goal #1: Pt will be independent with HEP  while maintaining shoulder precautions. Additional ADL Goal #2: Pt/caregiver will be independent with ADLs while maintaining shoulder precautions.    OT Frequency: Min 2X/week   Barriers to D/C:            Co-evaluation              End of Session Equipment Utilized During Treatment: Other (comment) (sling)  Activity Tolerance: Patient tolerated treatment well Patient left: in bed;with call bell/phone within reach;with family/visitor present   Time: 6283-6629 OT Time Calculation (min): 25 min Charges:  OT General Charges $OT Visit: 1 Procedure OT Evaluation $Initial OT Evaluation Tier I: 1 Procedure OT Treatments $Self Care/Home Management : 8-22 mins G-CodesBenito Mccreedy OTR/L [phone] 01/26/2014, 5:52 PM

## 2014-01-26 NOTE — Progress Notes (Signed)
Report given to lauren reynolds rn as caregiver 

## 2014-01-26 NOTE — Anesthesia Procedure Notes (Addendum)
Procedure Name: Intubation Date/Time: 01/26/2014 7:45 AM Performed by: Blair Heys E Pre-anesthesia Checklist: Patient identified, Emergency Drugs available, Suction available and Patient being monitored Patient Re-evaluated:Patient Re-evaluated prior to inductionOxygen Delivery Method: Circle system utilized Preoxygenation: Pre-oxygenation with 100% oxygen Intubation Type: IV induction Ventilation: Mask ventilation without difficulty Laryngoscope Size: Miller and 2 Grade View: Grade I Tube type: Oral Tube size: 7.5 mm Number of attempts: 1 Airway Equipment and Method: Stylet Placement Confirmation: ETT inserted through vocal cords under direct vision,  positive ETCO2 and breath sounds checked- equal and bilateral Secured at: 23 cm Tube secured with: Tape Dental Injury: Teeth and Oropharynx as per pre-operative assessment     Anesthesia Regional Block:  Interscalene brachial plexus block  Pre-Anesthetic Checklist: ,, timeout performed, Correct Patient, Correct Site, Correct Laterality, Correct Procedure, Correct Position, site marked, Risks and benefits discussed,  Surgical consent,  Pre-op evaluation,  At surgeon's request and post-op pain management  Laterality: Right  Prep: chloraprep       Needles:  Injection technique: Single-shot  Needle Type: Echogenic Stimulator Needle     Needle Length: 9cm 9 cm Needle Gauge: 21 and 21 G    Additional Needles:  Procedures: ultrasound guided (picture in chart) and nerve stimulator Interscalene brachial plexus block  Nerve Stimulator or Paresthesia:  Response: 0.4 mA,   Additional Responses:   Narrative:  Start time: 01/26/2014 7:05 AM End time: 01/26/2014 7:16 AM Injection made incrementally with aspirations every 5 mL.  Performed by: Personally  Anesthesiologist: Lillia Abed MD  Additional Notes: Monitors applied. Patient sedated. Sterile prep and drape,hand hygiene and sterile gloves were used. Relevant anatomy  identified.Needle position confirmed.Local anesthetic injected incrementally after negative aspiration. Local anesthetic spread visualized around nerve(s). Vascular puncture avoided. No complications. Image printed for medical record.The patient tolerated the procedure well.

## 2014-01-26 NOTE — Progress Notes (Signed)
Orthopedic Tech Progress Note Patient Details:  [name] [birth year] [id number] OHF applied to bed. Patient already had sling Patient ID: Norman Marks, male   DOB: 27-Jan-1946, 68 y.o.   MRN: [id number]   [name] 01/26/2014, 1:17 PM

## 2014-01-27 LAB — BASIC METABOLIC PANEL
Anion gap: 14 (ref 5–15)
BUN: 9 mg/dL (ref 6–23)
CALCIUM: 8.7 mg/dL (ref 8.4–10.5)
CO2: 23 mEq/L (ref 19–32)
Chloride: 102 mEq/L (ref 96–112)
Creatinine, Ser: 0.61 mg/dL (ref 0.50–1.35)
GFR calc Af Amer: >90 mL/min (ref >90)
GLUCOSE: 182 mg/dL — AB (ref 70–99)
Potassium: 4.1 mEq/L (ref 3.7–5.3)
Sodium: 139 mEq/L (ref 137–147)

## 2014-01-27 LAB — CBC
HEMATOCRIT: 37.3 % — AB (ref 39.0–52.0)
HEMOGLOBIN: 12.2 g/dL — AB (ref 13.0–17.0)
MCH: 30.8 pg (ref 26.0–34.0)
MCHC: 32.7 g/dL (ref 30.0–36.0)
MCV: 94.2 fL (ref 78.0–100.0)
Platelets: 213 10*3/uL (ref 150–400)
RBC: 3.96 MIL/uL — ABNORMAL LOW (ref 4.22–5.81)
RDW: 12.8 % (ref 11.5–15.5)
WBC: 9.6 10*3/uL (ref 4.0–10.5)

## 2014-01-27 LAB — GLUCOSE, CAPILLARY
GLUCOSE-CAPILLARY: 190 mg/dL — AB (ref 70–99)
GLUCOSE-CAPILLARY: 140 mg/dL — AB (ref 70–99)
Glucose-Capillary: 184 mg/dL — ABNORMAL HIGH (ref 70–99)
Glucose-Capillary: 140 mg/dL — ABNORMAL HIGH (ref 70–99)

## 2014-01-27 MED ORDER — ONDANSETRON HCL 4 MG PO TABS: 4.0000 mg | ORAL_TABLET | Freq: Three times a day (TID) | ORAL | Status: DC | PRN

## 2014-01-27 NOTE — Progress Notes (Signed)
     Subjective:  Patient reports pain as moderate.  Complains of nausea.  Objective:   VITALS:   Filed Vitals:   01/26/14 1748 01/26/14 2034 01/27/14 0052 01/27/14 0430  BP: 120/62 129/72 125/59 149/69  Pulse: 70 108 94 102  Temp: 97.1 F (36.2 C) 99 F (37.2 C) 98.8 F (37.1 C) 99.1 F (37.3 C)  TempSrc:  Oral Oral Oral  Resp: 17 18 18 20   Height:      Weight:      SpO2: 95% 98% 95% 96%    Neurologically intact Dorsiflexion/Plantar flexion intact Incision: dressing C/D/I   Lab Results  Component Value Date   WBC 9.6 01/27/2014   HGB 12.2* 01/27/2014   HCT 37.3* 01/27/2014   MCV 94.2 01/27/2014   PLT 213 01/27/2014     Assessment/Plan: 1 Day Post-Op   Principal Problem:   Osteoarthritis of right shoulder Active Problems:   Primary localized osteoarthrosis, shoulder region   Advance diet Up with therapy Discharge home with home health If passes pt and nausea/pain controlled.  zofran ordered after dc.   Felicitas Sine P 01/27/2014, 8:20 AM   Marchia Bond, MD Cell [phone]

## 2014-01-27 NOTE — Discharge Summary (Addendum)
Physician Discharge Summary  Patient ID: YOVANI COGBURN MRN: [id number] DOB/AGE: 69-11-1945 69 y.o.  Admit date: 01/26/2014 Discharge date: 01/28/2014  Admission Diagnoses:  Osteoarthritis of right shoulder  Discharge Diagnoses:  Principal Problem:   Osteoarthritis of right shoulder Active Problems:   Primary localized osteoarthrosis, shoulder region   Past Medical History  Diagnosis Date  . History of chicken pox   . Diabetes type 2, uncontrolled   . Hx of migraines   . BPH (benign prostatic hypertrophy)   . HTN (hypertension)     mild  . HLD (hyperlipidemia)   . CAD (coronary artery disease) 2009    [name] (cardiolite 2009 with 70% stenosis of proximal ramus, not amenable to PCI, rec treat medically)  . Osteopenia 06/2011    T score -3, completed 2 yrs forteo, T score improved to -2.2 (2015)  . History of nephrolithiasis 2012    L ureter, R renal pelvis  . DDD (degenerative disc disease), lumbar     s/p surgery  . Vitamin D deficiency   . History of bundle branch block   . History of adenomatous polyp of colon 10/2012  . CMC arthritis, thumb, degenerative 2014    s/p injections (Sypher)  . Arthritis of shoulder region, right, degenerative 2015    s/p injections (Sypher)  . PONV (postoperative nausea and vomiting)   . Heart murmur   . Dysrhythmia   . Diabetes mellitus without complication   . Headache(784.0)   . Cancer     basal cell skin cancer  . Osteoarthritis of right shoulder 01/26/2014    Surgeries: Procedure(s): TOTAL SHOULDER ARTHROPLASTY on 01/26/2014   Consultants (if any):    Discharged Condition: Improved  Hospital Course: SWADE SHONKA is an 68 y.o. male who was admitted 01/26/2014 with a diagnosis of Osteoarthritis of right shoulder and went to the operating room on 01/26/2014 and underwent the above named procedures.    He was given perioperative antibiotics:      Anti-infectives   Start     Dose/Rate Route Frequency Ordered Stop   01/26/14 1230  ceFAZolin (ANCEF) IVPB 1 g/50 mL premix     1 g 100 mL/hr over 30 Minutes Intravenous Every 6 hours 01/26/14 1159 01/27/14 0101   01/26/14 0600  ceFAZolin (ANCEF) IVPB 2 g/50 mL premix     2 g 100 mL/hr over 30 Minutes Intravenous On call to O.R. 01/25/14 1419 01/26/14 0750    .  He was given sequential compression devices, early ambulation for DVT prophylaxis.  He benefited maximally from the hospital stay and there were no complications.    Recent vital signs:  Filed Vitals:   01/28/14 0644  BP: 133/69  Pulse: 98  Temp: 97.9 F (36.6 C)  Resp: 18    Recent laboratory studies:  Lab Results  Component Value Date   HGB 12.2* 01/27/2014   HGB 14.3 01/18/2014   HGB 14.0 12/18/2013   Lab Results  Component Value Date   WBC 9.6 01/27/2014   PLT 213 01/27/2014   Lab Results  Component Value Date   INR 1.0 12/18/2013   Lab Results  Component Value Date   NA 139 01/27/2014   K 4.1 01/27/2014   CL 102 01/27/2014   CO2 23 01/27/2014   BUN 9 01/27/2014   CREATININE 0.61 01/27/2014   GLUCOSE 182* 01/27/2014    Discharge Medications:     Medication List    STOP taking these medications  ibuprofen 200 MG tablet  Commonly known as:  ADVIL,MOTRIN      TAKE these medications       aspirin 81 MG tablet  Take 81 mg by mouth daily.     baclofen 10 MG tablet  Commonly known as:  LIORESAL  Take 1 tablet (10 mg total) by mouth 3 (three) times daily. As needed for muscle spasm     BAYER CONTOUR TEST test strip  Generic drug:  glucose blood  USE TO TEST SUGAR ONCE TO TWICE A DAY AS DIRECTED     Calcium-Vitamin D 600-400 MG-UNIT Tabs  Take 1 tablet by mouth daily.     carvedilol 6.25 MG tablet  Commonly known as:  COREG  Take 1 tablet (6.25 mg total) by mouth 2 (two) times daily with a meal.     glucosamine-chondroitin 500-400 MG tablet  Commonly known as:  MAX GLUCOSAMINE CHONDROITIN  Take 1 tablet by mouth 2 (two) times daily.      HYDROcodone-acetaminophen 10-325 MG per tablet  Commonly known as:  NORCO  Take 1-2 tablets by mouth every 6 (six) hours as needed.     insulin glargine 100 UNIT/ML injection  Commonly known as:  LANTUS  Inject 0.32 mLs (32 Units total) into the skin daily.     Insulin Pen Needle 31G X 6 MM Misc  Commonly known as:  EXEL PEN NEEDLES 31GX1/4"  Use as directed     Insulin Syringe-Needle U-100 31G X 5/16" 0.3 ML Misc  Use to inject insulin as directed.     lovastatin 40 MG tablet  Commonly known as:  MEVACOR  Take 1 tablet (40 mg total) by mouth at bedtime.     metFORMIN 1000 MG tablet  Commonly known as:  GLUCOPHAGE  Take 1 tablet (1,000 mg total) by mouth daily with breakfast.     ondansetron 4 MG tablet  Commonly known as:  ZOFRAN  Take 1 tablet (4 mg total) by mouth every 8 (eight) hours as needed for nausea or vomiting.     ramipril 10 MG capsule  Commonly known as:  ALTACE  Take 1 capsule (10 mg total) by mouth daily.     sennosides-docusate sodium 8.6-50 MG tablet  Commonly known as:  SENOKOT-S  Take 2 tablets by mouth daily.     sitaGLIPtin 100 MG tablet  Commonly known as:  JANUVIA  Take 1 tablet (100 mg total) by mouth daily.     Vitamin D 2000 UNITS tablet  Take 1 tablet (2,000 Units total) by mouth daily.        Diagnostic Studies: Dg Chest 2 View  01/18/2014   CLINICAL DATA:  Preoperative for right total shoulder arthroplasty ; history of diabetes and hypertension and cardiac dysrhythmia as  EXAM: CHEST  2 VIEW  COMPARISON:  PA and lateral chest X ray of June 14, 2009  FINDINGS: The lungs are well-expanded. There is no focal infiltrate. The heart and mediastinal structures are normal. There is no pleural effusion. The bony thorax is unremarkable.  IMPRESSION: There is no active cardiopulmonary disease.   Electronically Signed   By: David  Martinique   On: 01/18/2014 12:44   Dg Shoulder Right Port  01/26/2014   CLINICAL DATA:  Postop right shoulder  arthroplasty.  EXAM: PORTABLE RIGHT SHOULDER - 2+ VIEW  COMPARISON:  None.  FINDINGS: Right shoulder arthroplasty appears well seated and aligned. There is no acute fracture or evidence of an operative complication.  IMPRESSION: Well-positioned right shoulder  arthroplasty.   Electronically Signed   By: Lajean Manes M.D.   On: 01/26/2014 10:57    Disposition:     Follow-up Information   Follow up with Johnny Bridge, MD. Schedule an appointment as soon as possible for a visit in 2 weeks.   Specialty:  Orthopedic Surgery   Contact information:   [place] [address] [phone]        Signed: Johnny Bridge 01/28/2014, 7:03 AM

## 2014-01-27 NOTE — Progress Notes (Signed)
Occupational Therapy Treatment Patient Details Name: Norman Marks MRN: [id number] DOB: 1946-03-09 Today's Date: 01/27/2014    History of present illness 68 y.o. s/p Right TSA, limited by nausea   OT comments  Patient seen this pm for OT intervention to address elbow, forearm, wrist, and hand exercise.  Reviewed donning and doffing sling - wife present and has demonstrated safe practice.  Patient with mild report of pain with dependent elbow flexion toward end range (AROM) exercise.  Patient asking about follow up therapy, and advised him that follow up OT would be at surgeon's discretion as movement indicated in shoulder.  Patient instructed to continue with exercises included in shoulder protocol until directed otherwise.    Follow Up Recommendations  No OT follow up;Supervision - Intermittent    Equipment Recommendations  None recommended by OT    Recommendations for Other Services      Precautions / Restrictions Precautions Precautions: Shoulder Type of Shoulder Precautions: no shoulder movement Shoulder Interventions: Shoulder sling/immobilizer;Off for dressing/bathing/exercises Precaution Comments: Reviewed precautions with pt/spouse Required Braces or Orthoses: Sling Restrictions Weight Bearing Restrictions: Yes RUE Weight Bearing: Non weight bearing       Mobility Bed Mobility                  Transfers                      Balance                                   ADL                                                Vision                     Perception     Praxis      Cognition                             Extremity/Trunk Assessment               Exercises     Shoulder Instructions       General Comments      Pertinent Vitals/ Pain       6/10 shoulder - pain "much more tolerable" today, although still troubled by nausea.    Home Living                                           Prior Functioning/Environment              Frequency Min 2X/week     Progress Toward Goals  OT Goals(current goals can now be found in the care plan section)  Progress towards OT goals: Progressing toward goals  Acute Rehab OT Goals Patient Stated Goal: return home tomorrow OT Goal Formulation: With patient Time For Goal Achievement: 02/02/14 Potential to Achieve Goals: Good  Plan Discharge plan remains appropriate;Frequency remains appropriate    Co-evaluation                 End of Session Equipment Utilized During Treatment: Other (  comment)   Activity Tolerance Patient tolerated treatment well   Patient Left in chair;with call bell/phone within reach;with family/visitor present   Nurse Communication          Time: 2202-5427 OT Time Calculation (min): 28 min  Charges: OT Treatments $Therapeutic Exercise: 23-37 mins  Marlowe Sax M 01/27/2014, 3:07 PM

## 2014-01-28 LAB — GLUCOSE, CAPILLARY: Glucose-Capillary: 160 mg/dL — ABNORMAL HIGH (ref 70–99)

## 2014-01-28 NOTE — Progress Notes (Signed)
Pt did have nausea this am after breakfast, po zofran given, with decreased nausea.  Po intake fair, but pt states he feels much better than yesterday and feels good enough to go home.  Wife at bedside. Pt worked with therapy OT this am. Pt and wife able to get shirt and sling on without difficulty. Pt up again walking in room and in hallway, tolerating it well, has slight yucky feeling in stomach, burping from time to time. Again states he feels much better than yesterday and insist he wants to go home, and will work on eating and drinking more at home.  D/c instructions reviewed with pt and wife. Copy of instructions and scripts given to pt/wife. Pt d/c'd via wheelchair with belongings with wife, escorted by hospital volunteer.

## 2014-01-28 NOTE — Progress Notes (Signed)
Occupational Therapy Treatment and Discharge Patient Details Name: MEYER ARORA MRN: [id number] DOB: [birth year] Today's Date: 01/28/2014    History of present illness 68 y.o. s/p Right TSA   OT comments  Focus of today's session was on reviewing shoulder instructions for ADLs and how to don/doff sling. Educated and had pt demonstrate understanding of elbow/wrist/hand AROM exercises. Pt will have supervision to assist with ADLs as needed at home and all education has been completed, therefore no further OT is needed. We will sign off.  Follow Up Recommendations  No OT follow up;Supervision - Intermittent    Equipment Recommendations  None recommended by OT       Precautions / Restrictions Precautions Precautions: Shoulder Type of Shoulder Precautions: no shoulder movement Shoulder Interventions: Shoulder sling/immobilizer;Off for dressing/bathing/exercises Precaution Booklet Issued: Yes (comment) Precaution Comments: Reviewed precautions with pt/spouse Required Braces or Orthoses: Sling Restrictions Weight Bearing Restrictions: Yes RUE Weight Bearing: Non weight bearing       Mobility Bed Mobility               General bed mobility comments: Pt up in chair upon OT tx.  Transfers Overall transfer level: Needs assistance   Transfers: Sit to/from Stand Sit to Stand: Supervision              Balance Overall balance assessment: Needs assistance Sitting-balance support: No upper extremity supported Sitting balance-Leahy Scale: Good       Standing balance-Leahy Scale: Fair                     ADL Overall ADL's : Needs assistance/impaired                                       General ADL Comments: Reviewed education previously provided to pt/spouse on shoulder information on handout. Practiced donning/doffing sling.                Cognition   Behavior During Therapy: WFL for tasks assessed/performed Overall Cognitive  Status: Within Functional Limits for tasks assessed                         Exercises Other Exercises Other Exercises: Instructed and had pt demonstrate understanding of elbow/wrist/hand AROM exercises. Pt instructed to do at least 10 reps 5 times a day.    Shoulder Instructions Shoulder Instructions Donning/doffing sling/immobilizer: Modified independent ROM for elbow, wrist and digits of operated UE: Modified independent Proper positioning of operated UE when showering: Modified independent Positioning of UE while sleeping: Patient able to independently direct caregiver          Pertinent Vitals/ Pain       No c/o pain during tx.          Frequency Min 2X/week     Progress Toward Goals  OT Goals(current goals can now be found in the care plan section)  Progress towards OT goals: Progressing toward goals  Acute Rehab OT Goals Patient Stated Goal: return home  OT Goal Formulation: With patient Time For Goal Achievement: 02/02/14 Potential to Achieve Goals: Good  Plan Discharge plan remains appropriate          Activity Tolerance Patient tolerated treatment well   Patient Left in chair;with call bell/phone within reach;with family/visitor present           Time:  -  Charges:    Lyda Perone 01/28/2014, 10:45 AM

## 2014-01-28 NOTE — Progress Notes (Signed)
I have read and agree with this note.   Time in/out:902-918 Total time: 16 minutes ([place])  [place], OTR/L [phone]

## 2014-01-28 NOTE — Progress Notes (Signed)
     Subjective:  Patient reports pain as mild.  Tylenol controlled pain.  zofran cured nausa.  Objective:   VITALS:   Filed Vitals:   01/27/14 0430 01/27/14 1335 01/27/14 2226 01/28/14 0644  BP: 149/69 154/60 121/66 133/69  Pulse: 102 102 111 98  Temp: 99.1 F (37.3 C) 98.1 F (36.7 C) 98.8 F (37.1 C) 97.9 F (36.6 C)  TempSrc: Oral Oral Oral Oral  Resp: 20 18 16 18   Height:      Weight:      SpO2: 96% 98% 94% 96%    Neurologically intact Sensation intact distally Dorsiflexion/Plantar flexion intact Incision: dressing C/D/I   Lab Results  Component Value Date   WBC 9.6 01/27/2014   HGB 12.2* 01/27/2014   HCT 37.3* 01/27/2014   MCV 94.2 01/27/2014   PLT 213 01/27/2014     Assessment/Plan: 2 Days Post-Op   Principal Problem:   Osteoarthritis of right shoulder Active Problems:   Primary localized osteoarthrosis, shoulder region   Advance diet Up with therapy Discharge home with home health    [place] 01/28/2014, 7:04 AM   Marchia Bond, MD Cell [phone]

## 2014-01-29 ENCOUNTER — Encounter (HOSPITAL_COMMUNITY): Payer: Self-pay | Admitting: Orthopedic Surgery

## 2014-03-17 ENCOUNTER — Other Ambulatory Visit: Payer: Self-pay | Admitting: Family Medicine

## 2014-03-17 DIAGNOSIS — Z135 Encounter for screening for eye and ear disorders: Secondary | ICD-10-CM

## 2014-04-21 ENCOUNTER — Ambulatory Visit (INDEPENDENT_AMBULATORY_CARE_PROVIDER_SITE_OTHER): Payer: Medicare Other | Admitting: Family Medicine

## 2014-04-21 ENCOUNTER — Encounter: Payer: Self-pay | Admitting: Family Medicine

## 2014-04-21 VITALS — BP 122/64 | HR 88 | Temp 98.0°F | Wt 165.0 lb

## 2014-04-21 DIAGNOSIS — E1165 Type 2 diabetes mellitus with hyperglycemia: Secondary | ICD-10-CM

## 2014-04-21 DIAGNOSIS — E118 Type 2 diabetes mellitus with unspecified complications: Secondary | ICD-10-CM

## 2014-04-21 DIAGNOSIS — Z23 Encounter for immunization: Secondary | ICD-10-CM

## 2014-04-21 DIAGNOSIS — E785 Hyperlipidemia, unspecified: Secondary | ICD-10-CM

## 2014-04-21 DIAGNOSIS — I10 Essential (primary) hypertension: Secondary | ICD-10-CM

## 2014-04-21 DIAGNOSIS — IMO0002 Reserved for concepts with insufficient information to code with codable children: Secondary | ICD-10-CM

## 2014-04-21 LAB — HEMOGLOBIN A1C: HEMOGLOBIN A1C: 7.3 % — AB (ref 4.6–6.5)

## 2014-04-21 NOTE — Addendum Note (Signed)
Addended by: Royann Shivers A on: 04/21/2014 10:25 AM   Modules accepted: Orders

## 2014-04-21 NOTE — Assessment & Plan Note (Signed)
Blood pressure controlled. Continue meds.

## 2014-04-21 NOTE — Assessment & Plan Note (Signed)
Continue januvia, metformin 1000mg  daily, discussed slow titration up on lantus to goal 40u daily.  Foot exam today. UTD pneumococcal vaccines. Discussed checking sugars fasting and post prandial Check a1c today, return 5 mo for medicare wellness visit

## 2014-04-21 NOTE — Assessment & Plan Note (Signed)
Chronic, stable. Continue lovastatin.  

## 2014-04-21 NOTE — Patient Instructions (Signed)
Flu shot today. A1c today. Slowly increase lantus by 1 unit every 3 days to goal of 40 units daily - if average 3 day fasting sugar >150. Once consistently below 150, occasionally check 2 hour post meal sugar along with or instead of fasting that day. Return in 5 months for labwork and afterwards for wellness visit.

## 2014-04-21 NOTE — Progress Notes (Signed)
BP 122/64  Pulse 88  Temp(Src) 98 F (36.7 C) (Oral)  Wt 165 lb (74.844 kg)   CC: 4 mo f/u  Subjective:    Patient ID: Norman Marks, male    DOB: Nov 26, 1945, 68 y.o.   MRN: [id number]  HPI: Norman Marks is a 68 y.o. male presenting on 04/21/2014 for Follow-up   Recent R shoulder replacement surgery by Dr. Mardelle Matte 12/2013. Still tight.  DM - regularly does check sugars 180 fasting. This morning fasting 120s. Compliant with antihyperglycemic regimen which includes: lantus 36 u (self increased from 32u), metformin 1000mg  in am and januvia 100mg  daily. Higher metformin doses caused GI upset. Denies low sugars or hypoglycemic symptoms.  Denies paresthesias. Last diabetic eye exam 04/2013. Has f/u scheduled. Pneumovax: 06/2010.  Prevnar: 09/2013. Lab Results  Component Value Date   HGBA1C 7.3* 12/18/2013     HTN - running well controlled on current regimen of ramipril 10mg  daily and carvedilol 6.25mg  bid.  HLD - no myalgas on lovastatin. Lab Results  Component Value Date   [facility] 63 09/07/2013    Relevant past medical, surgical, family and social history reviewed and updated as indicated.  Allergies and medications reviewed and updated. Current Outpatient Prescriptions on File Prior to Visit  Medication Sig  . aspirin 81 MG tablet Take 81 mg by mouth daily.  . baclofen (LIORESAL) 10 MG tablet Take 1 tablet (10 mg total) by mouth 3 (three) times daily. As needed for muscle spasm  . BAYER CONTOUR TEST test strip USE TO TEST SUGAR ONCE TO TWICE A DAY AS DIRECTED  . Calcium Carb-Cholecalciferol (CALCIUM-VITAMIN D) 600-400 MG-UNIT TABS Take 1 tablet by mouth daily.   . carvedilol (COREG) 6.25 MG tablet Take 1 tablet (6.25 mg total) by mouth 2 (two) times daily with a meal.  . Cholecalciferol (VITAMIN D) 2000 UNITS tablet Take 1 tablet (2,000 Units total) by mouth daily.  Marland Kitchen glucosamine-chondroitin (MAX GLUCOSAMINE CHONDROITIN) 500-400 MG tablet Take 1 tablet by mouth 2 (two) times  daily.  . Insulin Pen Needle (EXEL PEN NEEDLES 31GX1/4") 31G X 6 MM MISC Use as directed  . Insulin Syringe-Needle U-100 31G X 5/16" 0.3 ML MISC Use to inject insulin as directed.  . lovastatin (MEVACOR) 40 MG tablet Take 1 tablet (40 mg total) by mouth at bedtime.  . metFORMIN (GLUCOPHAGE) 1000 MG tablet Take 1 tablet (1,000 mg total) by mouth daily with breakfast.  . ramipril (ALTACE) 10 MG capsule Take 1 capsule (10 mg total) by mouth daily.  . sitaGLIPtin (JANUVIA) 100 MG tablet Take 1 tablet (100 mg total) by mouth daily.   No current facility-administered medications on file prior to visit.    Review of Systems Per HPI unless specifically indicated above    Objective:    BP 122/64  Pulse 88  Temp(Src) 98 F (36.7 C) (Oral)  Wt 165 lb (74.844 kg)  Physical Exam  Nursing note and vitals reviewed. Constitutional: He appears well-developed and well-nourished. No distress.  HENT:  Head: Normocephalic and atraumatic.  Right Ear: External ear normal.  Left Ear: External ear normal.  Nose: Nose normal.  Mouth/Throat: Oropharynx is clear and moist. No oropharyngeal exudate.  Eyes: Conjunctivae and EOM are normal. Pupils are equal, round, and reactive to light. No scleral icterus.  Neck: Normal range of motion. Neck supple.  Cardiovascular: Normal rate, regular rhythm, normal heart sounds and intact distal pulses.   No murmur heard. Pulmonary/Chest: Effort normal and breath sounds normal. No respiratory  distress. He has no wheezes. He has no rales.  Musculoskeletal: He exhibits no edema.  Diabetic foot exam: Normal inspection No skin breakdown No calluses  Normal DP/PT pulses Normal sensation to light tough and monofilament Nails normal  Lymphadenopathy:    He has no cervical adenopathy.  Skin: Skin is warm and dry. No rash noted.  Psychiatric: He has a normal mood and affect.       Assessment & Plan:   Problem List Items Addressed This Visit   HTN (hypertension)      Blood pressure controlled. Continue meds.    HLD (hyperlipidemia)     Chronic, stable. Continue lovastatin.    Diabetes mellitus type 2, uncontrolled, with complications - Primary     Continue januvia, metformin 1000mg  daily, discussed slow titration up on lantus to goal 40u daily.  Foot exam today. UTD pneumococcal vaccines. Discussed checking sugars fasting and post prandial Check a1c today, return 5 mo for medicare wellness visit    Relevant Medications      insulin glargine (LANTUS) 100 UNIT/ML injection       Follow up plan: Return in about 5 months (around 09/20/2014), or as needed, for medicare wellness.

## 2014-04-21 NOTE — Progress Notes (Signed)
Pre visit review using our clinic review tool, if applicable. No additional management support is needed unless otherwise documented below in the visit note. 

## 2014-04-22 ENCOUNTER — Telehealth: Payer: Self-pay

## 2014-04-22 NOTE — Telephone Encounter (Signed)
Pt stated that he has an appointment tomorrow at [facility].  He stated that he will have the report faxed to Dr. Bosie Clos office.

## 2014-04-23 LAB — HM DIABETES EYE EXAM

## 2014-04-28 ENCOUNTER — Other Ambulatory Visit: Payer: Self-pay | Admitting: Dermatology

## 2014-05-02 ENCOUNTER — Encounter: Payer: Self-pay | Admitting: Family Medicine

## 2014-05-08 ENCOUNTER — Other Ambulatory Visit: Payer: Self-pay | Admitting: Family Medicine

## 2014-05-12 NOTE — Telephone Encounter (Signed)
Copy of report in chart.  Unable to print.

## 2014-05-13 ENCOUNTER — Encounter: Payer: Self-pay | Admitting: Family Medicine

## 2014-06-24 ENCOUNTER — Other Ambulatory Visit: Payer: Self-pay | Admitting: Dermatology

## 2014-07-01 ENCOUNTER — Telehealth: Payer: Self-pay | Admitting: Family Medicine

## 2014-07-01 NOTE — Telephone Encounter (Signed)
Form in your IN box for completion. Please return to me.

## 2014-07-01 NOTE — Telephone Encounter (Signed)
Filled and in Kim's box. 

## 2014-07-01 NOTE — Telephone Encounter (Signed)
Pt dropped off new rx fax from for blood glucose meter from insurance company On kim's desk

## 2014-07-02 NOTE — Telephone Encounter (Signed)
Form faxed and scanned.

## 2014-07-20 ENCOUNTER — Ambulatory Visit (INDEPENDENT_AMBULATORY_CARE_PROVIDER_SITE_OTHER): Payer: Medicare Other | Admitting: Family Medicine

## 2014-07-20 ENCOUNTER — Encounter: Payer: Self-pay | Admitting: Family Medicine

## 2014-07-20 VITALS — BP 128/72 | HR 92 | Temp 98.0°F | Wt 166.2 lb

## 2014-07-20 DIAGNOSIS — R112 Nausea with vomiting, unspecified: Secondary | ICD-10-CM | POA: Insufficient documentation

## 2014-07-20 DIAGNOSIS — R42 Dizziness and giddiness: Secondary | ICD-10-CM | POA: Insufficient documentation

## 2014-07-20 LAB — CBC WITH DIFFERENTIAL/PLATELET
BASOS ABS: 0 10*3/uL (ref 0.0–0.1)
Basophils Relative: 0.3 % (ref 0.0–3.0)
EOS ABS: 0.1 10*3/uL (ref 0.0–0.7)
Eosinophils Relative: 1 % (ref 0.0–5.0)
HEMATOCRIT: 44.5 % (ref 39.0–52.0)
Hemoglobin: 14.5 g/dL (ref 13.0–17.0)
LYMPHS ABS: 1.4 10*3/uL (ref 0.7–4.0)
Lymphocytes Relative: 14.4 % (ref 12.0–46.0)
MCHC: 32.5 g/dL (ref 30.0–36.0)
MCV: 93.3 fl (ref 78.0–100.0)
Monocytes Absolute: 0.5 10*3/uL (ref 0.1–1.0)
Monocytes Relative: 5.3 % (ref 3.0–12.0)
NEUTROS PCT: 79 % — AB (ref 43.0–77.0)
Neutro Abs: 7.6 10*3/uL (ref 1.4–7.7)
PLATELETS: 282 10*3/uL (ref 150.0–400.0)
RBC: 4.77 Mil/uL (ref 4.22–5.81)
RDW: 12.6 % (ref 11.5–15.5)
WBC: 9.6 10*3/uL (ref 4.0–10.5)

## 2014-07-20 LAB — COMPREHENSIVE METABOLIC PANEL
ALBUMIN: 4.5 g/dL (ref 3.5–5.2)
ALT: 23 U/L (ref 0–53)
AST: 16 U/L (ref 0–37)
Alkaline Phosphatase: 65 U/L (ref 39–117)
BUN: 14 mg/dL (ref 6–23)
CALCIUM: 9.4 mg/dL (ref 8.4–10.5)
CO2: 27 meq/L (ref 19–32)
CREATININE: 0.6 mg/dL (ref 0.4–1.5)
Chloride: 107 mEq/L (ref 96–112)
GFR: 139.09 mL/min (ref >60.00)
Glucose, Bld: 94 mg/dL (ref 70–99)
Potassium: 4.3 mEq/L (ref 3.5–5.1)
SODIUM: 139 meq/L (ref 135–145)
TOTAL PROTEIN: 7.1 g/dL (ref 6.0–8.3)
Total Bilirubin: 0.9 mg/dL (ref 0.2–1.2)

## 2014-07-20 MED ORDER — PROMETHAZINE HCL 25 MG PO TABS: 25.0000 mg | ORAL_TABLET | Freq: Three times a day (TID) | ORAL | Status: DC | PRN

## 2014-07-20 MED ORDER — PROMETHAZINE HCL 25 MG/ML IJ SOLN
25.0000 mg | Freq: Once | INTRAMUSCULAR | Status: AC
  Administered 2014-07-20: 25 mg via INTRAMUSCULAR

## 2014-07-20 NOTE — Progress Notes (Signed)
Pre visit review using our clinic review tool, if applicable. No additional management support is needed unless otherwise documented below in the visit note. 

## 2014-07-20 NOTE — Patient Instructions (Addendum)
Let's check labwork today. In interim, may use phenergan for nausea as needed. Start monitoring temperature and if fever >101 or worsening abdominal pain or dizziness, please let us know or seek urgent care.  Gastritis, Adult Gastritis is soreness and swelling (inflammation) of the lining of the stomach. Gastritis can develop as a sudden onset (acute) or long-term (chronic) condition. If gastritis is not treated, it can lead to stomach bleeding and ulcers. CAUSES  Gastritis occurs when the stomach lining is weak or damaged. Digestive juices from the stomach then inflame the weakened stomach lining. The stomach lining may be weak or damaged due to viral or bacterial infections. One common bacterial infection is the Helicobacter pylori infection. Gastritis can also result from excessive alcohol consumption, taking certain medicines, or having too much acid in the stomach.  SYMPTOMS  In some cases, there are no symptoms. When symptoms are present, they may include:  Pain or a burning sensation in the upper abdomen.  Nausea.  Vomiting.  An uncomfortable feeling of fullness after eating. DIAGNOSIS  Your caregiver may suspect you have gastritis based on your symptoms and a physical exam. To determine the cause of your gastritis, your caregiver may perform the following:  Blood or stool tests to check for the H pylori bacterium.  Gastroscopy. A thin, flexible tube (endoscope) is passed down the esophagus and into the stomach. The endoscope has a light and camera on the end. Your caregiver uses the endoscope to view the inside of the stomach.  Taking a tissue sample (biopsy) from the stomach to examine under a microscope. TREATMENT  Depending on the cause of your gastritis, medicines may be prescribed. If you have a bacterial infection, such as an H pylori infection, antibiotics may be given. If your gastritis is caused by too much acid in the stomach, H2 blockers or antacids may be given. Your  caregiver may recommend that you stop taking aspirin, ibuprofen, or other nonsteroidal anti-inflammatory drugs (NSAIDs). HOME CARE INSTRUCTIONS  Only take over-the-counter or prescription medicines as directed by your caregiver.  If you were given antibiotic medicines, take them as directed. Finish them even if you start to feel better.  Drink enough fluids to keep your urine clear or pale yellow.  Avoid foods and drinks that make your symptoms worse, such as:  Caffeine or alcoholic drinks.  Chocolate.  Peppermint or mint flavorings.  Garlic and onions.  Spicy foods.  Citrus fruits, such as oranges, lemons, or limes.  Tomato-based foods such as sauce, chili, salsa, and pizza.  Fried and fatty foods.  Eat small, frequent meals instead of large meals. SEEK IMMEDIATE MEDICAL CARE IF:   You have black or dark red stools.  You vomit blood or material that looks like coffee grounds.  You are unable to keep fluids down.  Your abdominal pain gets worse.  You have a fever.  You do not feel better after 1 week.  You have any other questions or concerns. MAKE SURE YOU:  Understand these instructions.  Will watch your condition.  Will get help right away if you are not doing well or get worse. Document Released: 06/26/2001 Document Revised: 01/01/2012 Document Reviewed: 08/15/2011 [facility] Patient Information 2015 [place], [place]. This information is not intended to replace advice given to you by your health care provider. Make sure you discuss any questions you have with your health care provider.

## 2014-07-20 NOTE — Addendum Note (Signed)
Addended by: Marchia Bond on: 07/20/2014 02:38 PM   Modules accepted: Orders

## 2014-07-20 NOTE — Assessment & Plan Note (Addendum)
Wife also currently sick. Anticipate viral gastritis. Discussed this as well as expected resolution. Treat nausea today with phenergan - wife will drive. Check labs as well given comorbidities (DM) - CMP, CBC.

## 2014-07-20 NOTE — Progress Notes (Signed)
BP 128/72 mmHg  Pulse 92  Temp(Src) 98 F (36.7 C) (Oral)  Wt 166 lb 4 oz (75.411 kg)   CC: nausea/dizzy  Subjective:    Patient ID: Norman Marks, male    DOB: 12/30/1945, 69 y.o.   MRN: [id number]  HPI: Norman Marks is a 69 y.o. male presenting on 07/20/2014 for Nausea   Nausea, dizziness, vomiting for last 6 days. Initially thought car sickness related. Improving but still with some nausea and dizziness. Felt cold but no fever. No recent travel. No changes in food. Uses well water. Some constipation and headache. This morning felt worse - vomited x1 mucous. NBNB. Headache starts at base of neck but no neck stiffness. Dizziness described as lightheadedness > vertigo with sudden movement changes.    No bowel changes, diarrhea, blood in stool. No new joint pains. No skin rash. No new chemical exposure, no new ingestions. Appetite down.   Has been using zofran without improvement.  Wife with similar illness.  Thinks he's staying well hydrated, good urine output  Relevant past medical, surgical, family and social history reviewed and updated as indicated. Interim medical history since our last visit reviewed. Allergies and medications reviewed and updated.  Current Outpatient Prescriptions on File Prior to Visit  Medication Sig  . aspirin 81 MG tablet Take 81 mg by mouth daily.  . B-D INS SYR ULTRAFINE 1CC/31G 31G X 5/16" 1 ML MISC USE TO INJECT INSULIN AS DIRECTED.  Marland Kitchen BAYER CONTOUR TEST test strip USE TO TEST SUGAR ONCE TO TWICE A DAY AS DIRECTED  . carvedilol (COREG) 6.25 MG tablet Take 1 tablet (6.25 mg total) by mouth 2 (two) times daily with a meal.  . glucosamine-chondroitin (MAX GLUCOSAMINE CHONDROITIN) 500-400 MG tablet Take 1 tablet by mouth 2 (two) times daily.  . insulin glargine (LANTUS) 100 UNIT/ML injection Inject 36 Units into the skin daily.  . Insulin Pen Needle (EXEL PEN NEEDLES 31GX1/4") 31G X 6 MM MISC Use as directed  . lovastatin (MEVACOR) 40 MG tablet  Take 1 tablet (40 mg total) by mouth at bedtime.  . metFORMIN (GLUCOPHAGE) 1000 MG tablet Take 1 tablet (1,000 mg total) by mouth daily with breakfast.  . ramipril (ALTACE) 10 MG capsule Take 1 capsule (10 mg total) by mouth daily.  . sitaGLIPtin (JANUVIA) 100 MG tablet Take 1 tablet (100 mg total) by mouth daily.   No current facility-administered medications on file prior to visit.    Review of Systems Per HPI unless specifically indicated above     Objective:    BP 128/72 mmHg  Pulse 92  Temp(Src) 98 F (36.7 C) (Oral)  Wt 166 lb 4 oz (75.411 kg)  Wt Readings from Last 3 Encounters:  07/20/14 166 lb 4 oz (75.411 kg)  04/21/14 165 lb (74.844 kg)  01/26/14 163 lb 9.6 oz (74.208 kg)    Physical Exam  Constitutional: He is oriented to person, place, and time. He appears well-developed and well-nourished. No distress.  HENT:  Head: Normocephalic and atraumatic.  Right Ear: Hearing, tympanic membrane, external ear and ear canal normal.  Left Ear: Hearing, tympanic membrane, external ear and ear canal normal.  Mouth/Throat: Uvula is midline, oropharynx is clear and moist and mucous membranes are normal. No oropharyngeal exudate, posterior oropharyngeal edema, posterior oropharyngeal erythema or tonsillar abscesses.  Eyes: Conjunctivae and EOM are normal. Pupils are equal, round, and reactive to light. No scleral icterus.  Neck: Normal range of motion. Neck supple. Carotid bruit is  not present. No thyromegaly present.  Cardiovascular: Normal rate, regular rhythm, normal heart sounds and intact distal pulses.   No murmur heard. Pulmonary/Chest: Effort normal and breath sounds normal. No respiratory distress. He has no wheezes. He has no rales.  Abdominal: Soft. Normal appearance and bowel sounds are normal. He exhibits no distension and no mass. There is no hepatosplenomegaly. There is no tenderness. There is no rigidity, no rebound, no guarding, no CVA tenderness and negative Murphy's  sign. No hernia.  Musculoskeletal: He exhibits no edema.  No neck stiffness or midline tenderness.  Lymphadenopathy:    He has no cervical adenopathy.  Neurological: He is alert and oriented to person, place, and time. He has normal strength. No cranial nerve deficit or sensory deficit. Coordination and gait normal.  Slightly unsteady with romberg but overall negative CN 2-12 intact  Skin: Skin is warm and dry. No rash noted.  Psychiatric: He has a normal mood and affect.  Nursing note and vitals reviewed.  Results for orders placed or performed in visit on 05/02/14  HM DIABETES EYE EXAM  Result Value Ref Range   HM Diabetic Eye Exam No Retinopathy No Retinopathy      Assessment & Plan:   Problem List Items Addressed This Visit    Nausea with vomiting - Primary    Wife also currently sick. Anticipate viral gastritis. Discussed this as well as expected resolution. Treat nausea today with phenergan - wife will drive. Check labs as well given comorbidities (DM) - CMP, CBC.    Relevant Orders      Comprehensive metabolic panel      CBC with Differential   Dizziness    Possibly from dehydration - encouraged clear liquid diet for 2 days and push small sips of fluids. Check lytes to further eval dehydration    Relevant Orders      Comprehensive metabolic panel      CBC with Differential       Follow up plan: Return if symptoms worsen or fail to improve.

## 2014-07-20 NOTE — Assessment & Plan Note (Signed)
Possibly from dehydration - encouraged clear liquid diet for 2 days and push small sips of fluids. Check lytes to further eval dehydration 

## 2014-07-22 ENCOUNTER — Telehealth: Payer: Self-pay

## 2014-07-22 NOTE — Telephone Encounter (Signed)
Mrs Gee left v/m requesting cb [phone] for pts lab results.unable to reach by phone.

## 2014-07-22 NOTE — Telephone Encounter (Signed)
Message left advising stable labs.

## 2014-07-26 ENCOUNTER — Encounter: Payer: Self-pay | Admitting: Cardiovascular Disease

## 2014-07-26 ENCOUNTER — Ambulatory Visit (INDEPENDENT_AMBULATORY_CARE_PROVIDER_SITE_OTHER): Payer: Medicare Other | Admitting: Cardiovascular Disease

## 2014-07-26 VITALS — BP 138/80 | HR 93 | Ht 71.0 in | Wt 166.5 lb

## 2014-07-26 DIAGNOSIS — I447 Left bundle-branch block, unspecified: Secondary | ICD-10-CM

## 2014-07-26 DIAGNOSIS — I2510 Atherosclerotic heart disease of native coronary artery without angina pectoris: Secondary | ICD-10-CM

## 2014-07-26 DIAGNOSIS — E785 Hyperlipidemia, unspecified: Secondary | ICD-10-CM

## 2014-07-26 DIAGNOSIS — I10 Essential (primary) hypertension: Secondary | ICD-10-CM

## 2014-07-26 NOTE — Patient Instructions (Signed)
Continue same medications.   Your physician wants you to follow-up in: 1 year.  You will receive a reminder letter in the mail two months in advance. If you don't receive a letter, please call our office to schedule the follow-up appointment.  

## 2014-07-26 NOTE — Assessment & Plan Note (Signed)
Lab Results  Component Value Date   CHOL 121 09/07/2013   HDL 40.80 09/07/2013   LDLCALC 63 09/07/2013   TRIG 84.0 09/07/2013   CHOLHDL 3 09/07/2013   Continue treatment with lovastatin. LDL is at target.

## 2014-07-26 NOTE — Assessment & Plan Note (Signed)
He has no symptoms suggestive of arrhythmia.

## 2014-07-26 NOTE — Assessment & Plan Note (Signed)
Blood pressure is reasonably controlled on current medications. 

## 2014-07-26 NOTE — Assessment & Plan Note (Signed)
He has no symptoms suggestive of angina. Recommend continuing medical therapy. Most recent stress test in June of last year showed no evidence of ischemia.

## 2014-07-26 NOTE — Progress Notes (Signed)
Primary care physician:[name]  HPI  This is a pleasant 69 year old male who is here today for a follow-up visit regarding coronary artery disease. He was seen in June for preoperative cardiovascular evaluation before right shoulder surgery. He has known history of coronary artery disease and left bundle branch block. He underwent cardiac catheterization by Dr. [name] in 2009 which showed one-vessel coronary artery disease involving the proximal ramus (70% stenosis). He has known history of hypertension, diabetes and hyperlipidemia.  He underwent a pharmacologic nuclear stress test in June which showed a fixed anteroseptal and inferoseptal defects with normal ejection fraction. The defects were felt to be due to left bundle branch block. He has been doing very well and denies any chest pain, shortness of breath or palpitations.  Allergies  Allergen Reactions  . Metformin And Related Diarrhea    With higher doses, tolerating QD dosing well  . Percocet [Oxycodone-Acetaminophen] Itching     Current Outpatient Prescriptions on File Prior to Visit  Medication Sig Dispense Refill  . aspirin 81 MG tablet Take 81 mg by mouth daily.    . B-D INS SYR ULTRAFINE 1CC/31G 31G X 5/16" 1 ML MISC USE TO INJECT INSULIN AS DIRECTED. 100 each 2  . BAYER CONTOUR TEST test strip USE TO TEST SUGAR ONCE TO TWICE A DAY AS DIRECTED 100 each 3  . carvedilol (COREG) 6.25 MG tablet Take 1 tablet (6.25 mg total) by mouth 2 (two) times daily with a meal. 180 tablet 3  . glucosamine-chondroitin (MAX GLUCOSAMINE CHONDROITIN) 500-400 MG tablet Take 1 tablet by mouth 2 (two) times daily.    . insulin glargine (LANTUS) 100 UNIT/ML injection Inject 36 Units into the skin daily.    . Insulin Pen Needle (EXEL PEN NEEDLES 31GX1/4") 31G X 6 MM MISC Use as directed 100 each 3  . lovastatin (MEVACOR) 40 MG tablet Take 1 tablet (40 mg total) by mouth at bedtime. 90 tablet 3  . metFORMIN (GLUCOPHAGE) 1000 MG tablet Take 1  tablet (1,000 mg total) by mouth daily with breakfast. 90 tablet 3  . ondansetron (ZOFRAN) 4 MG tablet Take 4 mg by mouth every 8 (eight) hours as needed for nausea or vomiting.    . promethazine (PHENERGAN) 25 MG tablet Take 1 tablet (25 mg total) by mouth every 8 (eight) hours as needed for nausea or vomiting. 20 tablet 0  . ramipril (ALTACE) 10 MG capsule Take 1 capsule (10 mg total) by mouth daily. 90 capsule 3  . sitaGLIPtin (JANUVIA) 100 MG tablet Take 1 tablet (100 mg total) by mouth daily. 90 tablet 3   No current facility-administered medications on file prior to visit.     Past Medical History  Diagnosis Date  . History of chicken pox   . Diabetes type 2, uncontrolled   . Hx of migraines   . BPH (benign prostatic hypertrophy)   . HTN (hypertension)     mild  . HLD (hyperlipidemia)   . CAD (coronary artery disease) 2009    [name] (cardiolite 2009 with 70% stenosis of proximal ramus, not amenable to PCI, rec treat medically)  . Osteopenia 06/2011    T score -3, completed 2 yrs forteo, T score improved to -2.2 (2015)  . History of nephrolithiasis 2012    L ureter, R renal pelvis  . DDD (degenerative disc disease), lumbar     s/p surgery  . Vitamin D deficiency   . History of bundle branch block   . History of adenomatous polyp  of colon 10/2012  . CMC arthritis, thumb, degenerative 2014    s/p injections ([place]yp[place])  . Arthritis of shoulder region, right, degenerative 2015    s/p injections (Sypher)  . PONV (postoperative nausea and vomiting)   . Heart murmur   . Dysrhythmia   . Diabetes mellitus without complication   . Headache(784.0)   . Cancer     basal cell skin cancer  . 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 Osteoarthritis of [place] joint of thumb 2015     Past Surgical History  Procedure Laterality Date  . Back surgery  1994, 2008    lumbar; x 3, spinal fusion  . Knee surgery Right   . Shoulder surgery Right   . US echocardiography  10/2008    nl LV  fxn, EF 50%, mild MR, mild pulm HTN  . Skin surgery  2006    skin cancer  . Cardiovascular stress test  2011    fixed defect inferior/septal walls without reversibility, LVEF 43% post stress  . Dexa  06/2011    osteoporosis T -2.6 spine, -1.9 hip  . Flexible sigmoidoscopy  2007    pt reported normal ([place])  . Colonoscopy  10/2012    tubular adenoma, mild diverticulosis, rpt 5 yrs Deatra Ina)  . Dexa  07/2013    osteopenia T -2.2 femur, -1.2 spine  . Cardiac catheterization  2009    70% stenosis ostium  . 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 Cancer Father     lung and prostate  . Diabetes Father   . Heart disease Mother   . Cancer Mother     unsure  . Coronary artery disease Sister 88  . Heart attack Sister 54  . Diabetes Brother   . Stroke Neg Hx   . Colon cancer Neg Hx      History   Social History  . Marital Status: Married    Spouse Name: N/A    Number of Children: N/A  . Years of Education: N/A   Occupational History  . Not on file.   Social History Main Topics  . Smoking status: Former Smoker -- 0.25 packs/day for 10 years    Types: Cigarettes    Quit date: 10/20/1992  . Smokeless tobacco: Former Systems developer    Types: [place] date: 10/20/1992  . Alcohol Use: No  . Drug Use: No  . Sexual Activity: Not on file   Other Topics Concern  . Not on file   Social History Narrative   Caffeine: 1 cup coffee/day   Lives with wife, 1 dog, 5 cats.   Occupation: retired   Edu: 12th grade   Activity: no regular exercise   Diet: good water, fruits/vegetables daily     ROS A 10 point review of system was performed. It is negative other than that mentioned in the history of present illness.   PHYSICAL EXAM   BP 138/80 mmHg  Pulse 93  Ht 5\' 11"  (1.803 m)  Wt 166  lb 8 oz (75.524 kg)  BMI 23.23 kg/m2 Constitutional: He is oriented to person, place, and time. He appears well-developed and well-nourished. No distress.  HENT: No nasal discharge.  Head: Normocephalic and atraumatic.  Eyes: Pupils are equal and round.  No discharge. Neck: Normal  range of motion. Neck supple. No JVD present. No thyromegaly present.  Cardiovascular: Normal rate, regular rhythm, normal heart sounds. Exam reveals no gallop and no friction rub. No murmur heard.  Pulmonary/Chest: Effort normal and breath sounds normal. No stridor. No respiratory distress. He has no wheezes. He has no rales. He exhibits no tenderness.  Abdominal: Soft. Bowel sounds are normal. He exhibits no distension. There is no tenderness. There is no rebound and no guarding.  Musculoskeletal: Normal range of motion. He exhibits no edema and no tenderness.  Neurological: He is alert and oriented to person, place, and time. Coordination normal.  Skin: Skin is warm and dry. No rash noted. He is not diaphoretic. No erythema. No pallor.  Psychiatric: He has a normal mood and affect. His behavior is normal. Judgment and thought content normal.       EKG: Sinus rhythm with  left bundle branch block.   ASSESSMENT AND PLAN

## 2014-08-31 ENCOUNTER — Telehealth: Payer: Self-pay

## 2014-08-31 NOTE — Telephone Encounter (Signed)
Pt checked with [facility] and they do not have paperwork pt brought to [facility] in 06/2014 about diabetic testing supplies. Spoke with Margarita Grizzle at [employer] and she cannot find New prescription faxed form that was faxed on 07/02/14. I refaxed form with diabetic supply info to [place] fax # [phone]. Pt voiced understanding and will f/u with pharmacy.

## 2014-09-04 ENCOUNTER — Other Ambulatory Visit: Payer: Self-pay | Admitting: Family Medicine

## 2014-09-09 ENCOUNTER — Other Ambulatory Visit: Payer: Self-pay | Admitting: Dermatology

## 2014-09-11 ENCOUNTER — Other Ambulatory Visit: Payer: Self-pay | Admitting: Family Medicine

## 2014-09-11 DIAGNOSIS — N400 Enlarged prostate without lower urinary tract symptoms: Secondary | ICD-10-CM

## 2014-09-11 DIAGNOSIS — E349 Endocrine disorder, unspecified: Secondary | ICD-10-CM

## 2014-09-11 DIAGNOSIS — E118 Type 2 diabetes mellitus with unspecified complications: Principal | ICD-10-CM

## 2014-09-11 DIAGNOSIS — I10 Essential (primary) hypertension: Secondary | ICD-10-CM

## 2014-09-11 DIAGNOSIS — M810 Age-related osteoporosis without current pathological fracture: Secondary | ICD-10-CM

## 2014-09-11 DIAGNOSIS — E1165 Type 2 diabetes mellitus with hyperglycemia: Secondary | ICD-10-CM

## 2014-09-11 DIAGNOSIS — IMO0002 Reserved for concepts with insufficient information to code with codable children: Secondary | ICD-10-CM

## 2014-09-11 DIAGNOSIS — E785 Hyperlipidemia, unspecified: Secondary | ICD-10-CM

## 2014-09-11 DIAGNOSIS — E559 Vitamin D deficiency, unspecified: Secondary | ICD-10-CM

## 2014-09-13 ENCOUNTER — Other Ambulatory Visit (INDEPENDENT_AMBULATORY_CARE_PROVIDER_SITE_OTHER): Payer: Medicare Other

## 2014-09-13 DIAGNOSIS — E559 Vitamin D deficiency, unspecified: Secondary | ICD-10-CM

## 2014-09-13 DIAGNOSIS — M810 Age-related osteoporosis without current pathological fracture: Secondary | ICD-10-CM

## 2014-09-13 DIAGNOSIS — E118 Type 2 diabetes mellitus with unspecified complications: Secondary | ICD-10-CM

## 2014-09-13 DIAGNOSIS — N400 Enlarged prostate without lower urinary tract symptoms: Secondary | ICD-10-CM

## 2014-09-13 DIAGNOSIS — E1165 Type 2 diabetes mellitus with hyperglycemia: Secondary | ICD-10-CM

## 2014-09-13 DIAGNOSIS — IMO0002 Reserved for concepts with insufficient information to code with codable children: Secondary | ICD-10-CM

## 2014-09-13 DIAGNOSIS — E785 Hyperlipidemia, unspecified: Secondary | ICD-10-CM

## 2014-09-13 LAB — MICROALBUMIN / CREATININE URINE RATIO
Creatinine,U: 165.3 mg/dL
Microalb Creat Ratio: 0.8 mg/g (ref 0.0–30.0)
Microalb, Ur: 1.3 mg/dL (ref 0.0–1.9)

## 2014-09-13 LAB — PSA: PSA: 1.73 ng/mL (ref 0.10–4.00)

## 2014-09-13 LAB — HEMOGLOBIN A1C: Hgb A1c MFr Bld: 7.5 % — ABNORMAL HIGH (ref 4.6–6.5)

## 2014-09-13 LAB — LIPID PANEL
CHOLESTEROL: 164 mg/dL (ref 0–200)
HDL: 37.3 mg/dL — ABNORMAL LOW (ref >39.00)
LDL Cholesterol: 96 mg/dL (ref 0–99)
NonHDL: 126.7
Total CHOL/HDL Ratio: 4
Triglycerides: 154 mg/dL — ABNORMAL HIGH (ref 0.0–149.0)
VLDL: 30.8 mg/dL (ref 0.0–40.0)

## 2014-09-13 LAB — VITAMIN D 25 HYDROXY (VIT D DEFICIENCY, FRACTURES): VITD: 21.12 ng/mL — ABNORMAL LOW (ref 30.00–100.00)

## 2014-09-20 ENCOUNTER — Ambulatory Visit (INDEPENDENT_AMBULATORY_CARE_PROVIDER_SITE_OTHER): Payer: Medicare Other | Admitting: Family Medicine

## 2014-09-20 ENCOUNTER — Encounter: Payer: Self-pay | Admitting: Family Medicine

## 2014-09-20 VITALS — BP 136/82 | HR 101 | Temp 97.5°F | Ht 71.0 in | Wt 166.1 lb

## 2014-09-20 DIAGNOSIS — I2510 Atherosclerotic heart disease of native coronary artery without angina pectoris: Secondary | ICD-10-CM

## 2014-09-20 DIAGNOSIS — IMO0002 Reserved for concepts with insufficient information to code with codable children: Secondary | ICD-10-CM

## 2014-09-20 DIAGNOSIS — E559 Vitamin D deficiency, unspecified: Secondary | ICD-10-CM

## 2014-09-20 DIAGNOSIS — E785 Hyperlipidemia, unspecified: Secondary | ICD-10-CM

## 2014-09-20 DIAGNOSIS — Z0000 Encounter for general adult medical examination without abnormal findings: Secondary | ICD-10-CM

## 2014-09-20 DIAGNOSIS — N400 Enlarged prostate without lower urinary tract symptoms: Secondary | ICD-10-CM

## 2014-09-20 DIAGNOSIS — M19011 Primary osteoarthritis, right shoulder: Secondary | ICD-10-CM

## 2014-09-20 DIAGNOSIS — M8580 Other specified disorders of bone density and structure, unspecified site: Secondary | ICD-10-CM

## 2014-09-20 DIAGNOSIS — Z7189 Other specified counseling: Secondary | ICD-10-CM

## 2014-09-20 DIAGNOSIS — E118 Type 2 diabetes mellitus with unspecified complications: Secondary | ICD-10-CM

## 2014-09-20 DIAGNOSIS — E1165 Type 2 diabetes mellitus with hyperglycemia: Secondary | ICD-10-CM

## 2014-09-20 DIAGNOSIS — I10 Essential (primary) hypertension: Secondary | ICD-10-CM

## 2014-09-20 MED ORDER — CARVEDILOL 12.5 MG PO TABS: 12.5000 mg | ORAL_TABLET | Freq: Two times a day (BID) | ORAL | Status: DC

## 2014-09-20 MED ORDER — RAMIPRIL 10 MG PO CAPS: 10.0000 mg | ORAL_CAPSULE | Freq: Every day | ORAL | Status: DC

## 2014-09-20 MED ORDER — LOVASTATIN 40 MG PO TABS: 40.0000 mg | ORAL_TABLET | Freq: Every day | ORAL | Status: DC

## 2014-09-20 MED ORDER — CARVEDILOL 6.25 MG PO TABS: 6.2500 mg | ORAL_TABLET | Freq: Two times a day (BID) | ORAL | Status: DC

## 2014-09-20 MED ORDER — METFORMIN HCL 1000 MG PO TABS: 1000.0000 mg | ORAL_TABLET | Freq: Every day | ORAL | Status: DC

## 2014-09-20 MED ORDER — INSULIN GLARGINE 100 UNIT/ML ~~LOC~~ SOLN: SUBCUTANEOUS | Status: DC

## 2014-09-20 MED ORDER — SITAGLIPTIN PHOSPHATE 100 MG PO TABS: 100.0000 mg | ORAL_TABLET | Freq: Every day | ORAL | Status: DC

## 2014-09-20 NOTE — Patient Instructions (Addendum)
Advanced directive packet provided today. Good to see you today, call us with questions. Start vitamin D 1000 units daily. Make sure you get plenty of calcium in your diet. Goal LDL <70. Work on low cholesterol diet. Continue lovastatin. Increase carvedilol to 12.5mg  twice daily.  Return in 4-6 moths for recheck cholesterol levels.

## 2014-09-20 NOTE — Assessment & Plan Note (Signed)
S/p R shoulder replacement.

## 2014-09-20 NOTE — Assessment & Plan Note (Signed)
History of 70% blockage, rec medical management.

## 2014-09-20 NOTE — Assessment & Plan Note (Signed)
Mildly enlarged but stable. Continue to monitor.

## 2014-09-20 NOTE — Assessment & Plan Note (Signed)
Will start vit D 1000 IU daily.

## 2014-09-20 NOTE — Assessment & Plan Note (Signed)
Chronic, stable. Continue lovastatin. LDL goal <70. Stressed working on [employer]. Recheck FLP in 4-6 months and decide on more potent statin then.

## 2014-09-20 NOTE — Assessment & Plan Note (Signed)
Chronic, stable. Continue regimen. 

## 2014-09-20 NOTE — Progress Notes (Signed)
Pre visit review using our clinic review tool, if applicable. No additional management support is needed unless otherwise documented below in the visit note. 

## 2014-09-20 NOTE — Progress Notes (Signed)
BP 136/82 mmHg  Pulse 101  Temp(Src) 97.5 F (36.4 C) (Oral)  Ht 5\' 11"  (1.803 m)  Wt 166 lb 1.9 oz (75.352 kg)  BMI 23.18 kg/m2  SpO2 98%   CC: medicare wellness visit  Subjective:    Patient ID: Norman Marks, male    DOB: 1946/04/19, 69 y.o.   MRN: [id number]  HPI: Norman Marks is a 69 y.o. male presenting on 09/20/2014 for Annual Exam   Passes hearing and vision screens today.  No falls  No depression/anhedonia.   Preventative: COLONOSCOPY Date: 10/2012 tubular adenoma, mild diverticulosis, rpt 5 yrs Deatra Ina) Prostate cancer screening - rpt today penumovax 06/28/2010. prevnar 09/2013 Flu 04/2014 Tetanus around 2011 zostavax - 01/2013 Advanced directive: doesn't have one set up. Has talked about this with wife.Wife would be HCPOA.Does not want drastic measures. Packet provided today.  Caffeine: 1 cup coffee/day  Lives with wife, 1 dog, 5 cats.  Occupation: retired  Edu: 12th grade  Activity: stationary bicycle 70min 2-3x/wk Diet: good water, fruits/vegetables daily   Relevant past medical, surgical, family and social history reviewed and updated as indicated. Interim medical history since our last visit reviewed. Allergies and medications reviewed and updated. Current Outpatient Prescriptions on File Prior to Visit  Medication Sig  . aspirin 81 MG tablet Take 81 mg by mouth daily.  . B-D INS SYR ULTRAFINE 1CC/31G 31G X 5/16" 1 ML MISC USE TO INJECT INSULIN AS DIRECTED.  Marland Kitchen BAYER CONTOUR TEST test strip USE TO TEST SUGAR ONCE TO TWICE A DAY AS DIRECTED  . glucosamine-chondroitin (MAX GLUCOSAMINE CHONDROITIN) 500-400 MG tablet Take 1 tablet by mouth 2 (two) times daily.  . Insulin Pen Needle (EXEL PEN NEEDLES 31GX1/4") 31G X 6 MM MISC Use as directed   No current facility-administered medications on file prior to visit.    Review of Systems  Constitutional: Negative for fever, chills, activity change, appetite change, fatigue and unexpected weight  change.  HENT: Negative for hearing loss.   Eyes: Negative for visual disturbance.  Respiratory: Positive for cough (mild over last 3 days). Negative for chest tightness, shortness of breath and wheezing.   Cardiovascular: Negative for chest pain, palpitations and leg swelling.  Gastrointestinal: Negative for nausea, vomiting, abdominal pain, diarrhea, constipation, blood in stool and abdominal distention.  Genitourinary: Negative for hematuria and difficulty urinating.  Musculoskeletal: Negative for myalgias, arthralgias and neck pain.  Skin: Negative for rash.  Neurological: Negative for dizziness, seizures, syncope and headaches.  Hematological: Negative for adenopathy. Does not bruise/bleed easily.  Psychiatric/Behavioral: Negative for dysphoric mood. The patient is not nervous/anxious.    Per HPI unless specifically indicated above     Objective:    BP 136/82 mmHg  Pulse 101  Temp(Src) 97.5 F (36.4 C) (Oral)  Ht 5\' 11"  (1.803 m)  Wt 166 lb 1.9 oz (75.352 kg)  BMI 23.18 kg/m2  SpO2 98%  Wt Readings from Last 3 Encounters:  09/20/14 166 lb 1.9 oz (75.352 kg)  07/26/14 166 lb 8 oz (75.524 kg)  07/20/14 166 lb 4 oz (75.411 kg)    Physical Exam  Constitutional: He is oriented to person, place, and time. He appears well-developed and well-nourished. No distress.  HENT:  Head: Normocephalic and atraumatic.  Right Ear: Hearing, tympanic membrane, external ear and ear canal normal.  Left Ear: Hearing, tympanic membrane, external ear and ear canal normal.  Nose: Nose normal.  Mouth/Throat: Uvula is midline, oropharynx is clear and moist and mucous membranes are normal.  No oropharyngeal exudate, posterior oropharyngeal edema or posterior oropharyngeal erythema.  Eyes: Conjunctivae and EOM are normal. Pupils are equal, round, and reactive to light. No scleral icterus.  Neck: Normal range of motion. Neck supple.  Cardiovascular: Normal rate, regular rhythm, normal heart sounds and  intact distal pulses.   No murmur heard. Pulses:      Radial pulses are 2+ on the right side, and 2+ on the left side.  Pulmonary/Chest: Effort normal and breath sounds normal. No respiratory distress. He has no wheezes. He has no rales.  Abdominal: Soft. Bowel sounds are normal. He exhibits no distension and no mass. There is no tenderness. There is no rebound and no guarding.  Genitourinary: Rectum normal and prostate normal. Rectal exam shows no external hemorrhoid, no internal hemorrhoid, no fissure, no mass, no tenderness and anal tone normal. Prostate is not enlarged (25gm) and not tender.  Musculoskeletal: Normal range of motion. He exhibits no edema.  Lymphadenopathy:    He has no cervical adenopathy.  Neurological: He is alert and oriented to person, place, and time.  CN grossly intact, station and gait intact  Skin: Skin is warm and dry. No rash noted.  Psychiatric: He has a normal mood and affect. His behavior is normal. Judgment and thought content normal.  Nursing note and vitals reviewed.  Results for orders placed or performed in visit on 09/13/14  Lipid panel  Result Value Ref Range   Cholesterol 164 0 - 200 mg/dL   Triglycerides 154.0 (H) 0.0 - 149.0 mg/dL   HDL 37.30 (L) >39.00 mg/dL   VLDL 30.8 0.0 - 40.0 mg/dL   LDL Cholesterol 96 0 - 99 mg/dL   Total CHOL/HDL Ratio 4    NonHDL 126.70   Hemoglobin A1c  Result Value Ref Range   Hgb A1c MFr Bld 7.5 (H) 4.6 - 6.5 %  Vit D  25 hydroxy (rtn osteoporosis monitoring)  Result Value Ref Range   VITD 21.12 (L) 30.00 - 100.00 ng/mL  Microalbumin / creatinine urine ratio  Result Value Ref Range   Microalb, Ur 1.3 0.0 - 1.9 mg/dL   Creatinine,U 165.3 mg/dL   Microalb Creat Ratio 0.8 0.0 - 30.0 mg/g  PSA  Result Value Ref Range   PSA 1.73 0.10 - 4.00 ng/mL      Assessment & Plan:   Problem List Items Addressed This Visit    Vitamin d deficiency    Will start vit D 1000 IU daily.      Primary localized  osteoarthrosis of shoulder region    S/p R shoulder replacement.      Osteopenia    We discussed starting vit D daily. Discussed increased dairy and other sources of calcium in diet.      Medicare annual wellness visit, subsequent - Primary    I have personally reviewed the Medicare Annual Wellness questionnaire and have noted 1. The patient's medical and social history 2. Their use of alcohol, tobacco or illicit drugs 3. Their current medications and supplements 4. The patient's functional ability including ADL's, fall risks, home safety risks and hearing or visual impairment. 5. Diet and physical activity 6. Evidence for depression or mood disorders The patients weight, height, BMI have been recorded in the chart.  Hearing and vision has been addressed. I have made referrals, counseling and provided education to the patient based review of the above and I have provided the pt with a written personalized care plan for preventive services. Provider list updated - see  scanned questionairre. Reviewed preventative protocols and updated unless pt declined.      HTN (hypertension)    Chronic, stable. Continue regimen.      Relevant Medications   carvedilol (COREG) tablet   lovastatin (MEVACOR) 40 MG tablet   ramipril (ALTACE) capsule   HLD (hyperlipidemia)    Chronic, stable. Continue lovastatin. LDL goal <70. Stressed working on [employer]. Recheck FLP in 4-6 months and decide on more potent statin then.      Relevant Medications   carvedilol (COREG) tablet   lovastatin (MEVACOR) 40 MG tablet   ramipril (ALTACE) capsule   Health maintenance examination    Preventative protocols reviewed and updated unless pt declined. Discussed healthy diet and lifestyle.       Diabetes mellitus type 2, uncontrolled, with complications    Chronic, stable. Continue current regimen. No changes today.      Relevant Medications   lovastatin (MEVACOR) 40 MG tablet   metFORMIN  (GLUCOPHAGE) tablet   ramipril (ALTACE) capsule   sitaGLIPtin (JANUVIA) tablet   insulin glargine (LANTUS) 100 UNIT/ML injection   CAD (coronary artery disease)    History of 70% blockage, rec medical management.      Relevant Medications   carvedilol (COREG) tablet   lovastatin (MEVACOR) 40 MG tablet   ramipril (ALTACE) capsule   BPH (benign prostatic hypertrophy)    Mildly enlarged but stable. Continue to monitor.      Advanced care planning/counseling discussion    Advanced directive: doesn't have one set up. Has talked about this with wife.Wife would be HCPOA.Does not want drastic measures. Packet provided today.           Follow up plan: Return in about 6 months (around 03/23/2015), or as needed, for follow up visit.

## 2014-09-20 NOTE — Assessment & Plan Note (Signed)

## 2014-09-20 NOTE — Assessment & Plan Note (Signed)
Advanced directive: doesn't have one set up. Has talked about this with wife.Wife would be HCPOA.Does not want drastic measures. Packet provided today.

## 2014-09-20 NOTE — Assessment & Plan Note (Signed)
We discussed starting vit D daily. Discussed increased dairy and other sources of calcium in diet.

## 2014-09-20 NOTE — Assessment & Plan Note (Signed)
Chronic, stable. Continue current regimen. No changes today.

## 2014-09-20 NOTE — Assessment & Plan Note (Signed)
Preventative protocols reviewed and updated unless pt declined. Discussed healthy diet and lifestyle.  

## 2014-10-12 ENCOUNTER — Other Ambulatory Visit: Payer: Self-pay | Admitting: Dermatology

## 2014-10-13 ENCOUNTER — Emergency Department
Admit: 2014-10-13 | Disposition: A | Discharge status: Home / Self Care | Payer: Self-pay | Admitting: Emergency Medicine

## 2014-10-20 ENCOUNTER — Emergency Department
Admit: 2014-10-20 | Disposition: A | Discharge status: Home / Self Care | Payer: Self-pay | Admitting: Emergency Medicine

## 2014-10-23 ENCOUNTER — Emergency Department
Admit: 2014-10-23 | Disposition: A | Discharge status: Home / Self Care | Payer: Self-pay | Admitting: Emergency Medicine

## 2014-10-27 LAB — WOUND CULTURE

## 2014-11-11 ENCOUNTER — Other Ambulatory Visit: Payer: Self-pay | Admitting: Dermatology

## 2015-02-01 ENCOUNTER — Other Ambulatory Visit: Payer: Self-pay | Admitting: Family Medicine

## 2015-03-16 ENCOUNTER — Other Ambulatory Visit (INDEPENDENT_AMBULATORY_CARE_PROVIDER_SITE_OTHER): Payer: Medicare Other

## 2015-03-16 ENCOUNTER — Other Ambulatory Visit: Payer: Self-pay | Admitting: Family Medicine

## 2015-03-16 DIAGNOSIS — E118 Type 2 diabetes mellitus with unspecified complications: Secondary | ICD-10-CM | POA: Diagnosis not present

## 2015-03-16 DIAGNOSIS — E785 Hyperlipidemia, unspecified: Secondary | ICD-10-CM | POA: Diagnosis not present

## 2015-03-16 DIAGNOSIS — IMO0002 Reserved for concepts with insufficient information to code with codable children: Secondary | ICD-10-CM

## 2015-03-16 DIAGNOSIS — E1165 Type 2 diabetes mellitus with hyperglycemia: Secondary | ICD-10-CM

## 2015-03-16 LAB — BASIC METABOLIC PANEL
BUN: 13 mg/dL (ref 6–23)
CALCIUM: 9.4 mg/dL (ref 8.4–10.5)
CO2: 29 meq/L (ref 19–32)
Chloride: 107 mEq/L (ref 96–112)
Creatinine, Ser: 0.71 mg/dL (ref 0.40–1.50)
GFR: 116.52 mL/min (ref >60.00)
Glucose, Bld: 89 mg/dL (ref 70–99)
Potassium: 4.1 mEq/L (ref 3.5–5.1)
SODIUM: 141 meq/L (ref 135–145)

## 2015-03-16 LAB — LIPID PANEL
CHOL/HDL RATIO: 3
Cholesterol: 104 mg/dL (ref 0–200)
HDL: 41.5 mg/dL (ref >39.00)
LDL Cholesterol: 48 mg/dL (ref 0–99)
NONHDL: 62.6
Triglycerides: 74 mg/dL (ref 0.0–149.0)
VLDL: 14.8 mg/dL (ref 0.0–40.0)

## 2015-03-16 LAB — HEMOGLOBIN A1C: Hgb A1c MFr Bld: 6.8 % — ABNORMAL HIGH (ref 4.6–6.5)

## 2015-03-23 ENCOUNTER — Ambulatory Visit (INDEPENDENT_AMBULATORY_CARE_PROVIDER_SITE_OTHER): Payer: Medicare Other | Admitting: Family Medicine

## 2015-03-23 ENCOUNTER — Encounter: Payer: Self-pay | Admitting: Family Medicine

## 2015-03-23 VITALS — BP 126/58 | HR 88 | Temp 98.3°F | Wt 165.5 lb

## 2015-03-23 DIAGNOSIS — I2510 Atherosclerotic heart disease of native coronary artery without angina pectoris: Secondary | ICD-10-CM

## 2015-03-23 DIAGNOSIS — E118 Type 2 diabetes mellitus with unspecified complications: Secondary | ICD-10-CM

## 2015-03-23 DIAGNOSIS — E785 Hyperlipidemia, unspecified: Secondary | ICD-10-CM | POA: Diagnosis not present

## 2015-03-23 DIAGNOSIS — Z23 Encounter for immunization: Secondary | ICD-10-CM | POA: Diagnosis not present

## 2015-03-23 DIAGNOSIS — I10 Essential (primary) hypertension: Secondary | ICD-10-CM

## 2015-03-23 DIAGNOSIS — M8580 Other specified disorders of bone density and structure, unspecified site: Secondary | ICD-10-CM

## 2015-03-23 DIAGNOSIS — Z1159 Encounter for screening for other viral diseases: Secondary | ICD-10-CM

## 2015-03-23 NOTE — Assessment & Plan Note (Signed)
Stable on medical management.

## 2015-03-23 NOTE — Progress Notes (Signed)
BP 126/58 mmHg  Pulse 88  Temp(Src) 98.3 F (36.8 C) (Oral)  Wt 165 lb 8 oz (75.07 kg)   CC: 6 mo DM f/u visit  Subjective:    Patient ID: Norman Marks, male    DOB: 1945/10/20, 69 y.o.   MRN: [id number]  HPI: Norman Marks is a 69 y.o. male presenting on 03/23/2015 for Follow-up   Vit D def - last visit 1000 IU started. Continues taking this. Known h/o osteopenia.  DM - regularly does check sugars fasting 80-100. Compliant with antihyperglycemic regimen which includes: lantus 40u daily (has actually decreased to 36u nightly), metformin 1000mg  once daily, [place] 100mg  daily.  Denies low sugars or hypoglycemic symptoms. Denies paresthesias. Last diabetic eye exam 04/2014.  Pneumovax: 2011.  Prevnar: 2015. Increased water intake Lab Results  Component Value Date   HGBA1C 6.8* 03/16/2015   Diabetic Foot Exam - Simple   Simple Foot Form  Diabetic Foot exam was performed with the following findings:  Yes 03/23/2015  8:22 AM  Visual Inspection  No deformities, no ulcerations, no other skin breakdown bilaterally:  Yes  Sensation Testing  Intact to touch and monofilament testing bilaterally:  Yes  Pulse Check  Posterior Tibialis and Dorsalis pulse intact bilaterally:  Yes  Comments      HTN - Compliant with current antihypertensive regimen of carvedilol 12.5mg  bid, ramipril 10mg  daily.  Does check blood pressures at home: good control endorsed.  No low blood pressure readings or symptoms of dizziness/syncope.  Denies HA, vision changes, CP/tightness, SOB, leg swelling.    HLD - compliant with lovastatin 40mg  daily. LDL goal <70.   CAD - known 70% blockage - under medical management. Asxs. Followed by Dr [name].  Relevant past medical, surgical, family and social history reviewed and updated as indicated. Interim medical history since our last visit reviewed. Allergies and medications reviewed and updated. Current Outpatient Prescriptions on File Prior to Visit  Medication Sig   . aspirin 81 MG tablet Take 81 mg by mouth daily.  . B-D INS SYR ULTRAFINE 1CC/31G 31G X 5/16" 1 ML MISC USE TO INJECT INSULIN AS DIRECTED.  Marland Kitchen BAYER CONTOUR TEST test strip USE TO TEST SUGAR ONCE TO TWICE A DAY AS DIRECTED  . carvedilol (COREG) 12.5 MG tablet Take 1 tablet (12.5 mg total) by mouth 2 (two) times daily with a meal.  . cholecalciferol (VITAMIN D) 1000 UNITS tablet Take 1,000 Units by mouth daily.  Marland Kitchen glucosamine-chondroitin (MAX GLUCOSAMINE CHONDROITIN) 500-400 MG tablet Take 1 tablet by mouth 2 (two) times daily.  . insulin glargine (LANTUS) 100 UNIT/ML injection INJECT 40 UNITS INTO THE SKIN DAILY AS DIRECTED (Patient taking differently: Inject 36 Units into the skin at bedtime. INJECT 40 UNITS INTO THE SKIN DAILY AS DIRECTED)  . Insulin Pen Needle (EXEL PEN NEEDLES 31GX1/4") 31G X 6 MM MISC Use as directed  . lovastatin (MEVACOR) 40 MG tablet Take 1 tablet (40 mg total) by mouth at bedtime.  . metFORMIN (GLUCOPHAGE) 1000 MG tablet Take 1 tablet (1,000 mg total) by mouth daily with breakfast.  . ramipril (ALTACE) 10 MG capsule Take 1 capsule (10 mg total) by mouth daily.  . sitaGLIPtin (JANUVIA) 100 MG tablet Take 1 tablet (100 mg total) by mouth daily.   No current facility-administered medications on file prior to visit.    Review of Systems Per HPI unless specifically indicated above     Objective:    BP 126/58 mmHg  Pulse 88  Temp(Src)  98.3 F (36.8 C) (Oral)  Wt 165 lb 8 oz (75.07 kg)  Wt Readings from Last 3 Encounters:  03/23/15 165 lb 8 oz (75.07 kg)  09/20/14 166 lb 1.9 oz (75.352 kg)  07/26/14 166 lb 8 oz (75.524 kg)    Physical Exam  Constitutional: He appears well-developed and well-nourished. No distress.  HENT:  Head: Normocephalic and atraumatic.  Right Ear: External ear normal.  Left Ear: External ear normal.  Nose: Nose normal.  Mouth/Throat: Oropharynx is clear and moist. No oropharyngeal exudate.  Eyes: Conjunctivae and EOM are normal.  Pupils are equal, round, and reactive to light. No scleral icterus.  Neck: Normal range of motion. Neck supple.  Cardiovascular: Normal rate, regular rhythm, normal heart sounds and intact distal pulses.   No murmur heard. Pulmonary/Chest: Effort normal and breath sounds normal. No respiratory distress. He has no wheezes. He has no rales.  Musculoskeletal: He exhibits no edema.  See HPI for foot exam if done  Lymphadenopathy:    He has no cervical adenopathy.  Skin: Skin is warm and dry. No rash noted.  Psychiatric: He has a normal mood and affect.  Nursing note and vitals reviewed.  Results for orders placed or performed in visit on 76/54/65  Basic metabolic panel  Result Value Ref Range   Sodium 141 135 - 145 mEq/L   Potassium 4.1 3.5 - 5.1 mEq/L   Chloride 107 96 - 112 mEq/L   CO2 29 19 - 32 mEq/L   Glucose, Bld 89 70 - 99 mg/dL   BUN 13 6 - 23 mg/dL   Creatinine, Ser 0.71 0.40 - 1.50 mg/dL   Calcium 9.4 8.4 - 10.5 mg/dL   GFR 116.52 >60.00 mL/min  Lipid panel  Result Value Ref Range   Cholesterol 104 0 - 200 mg/dL   Triglycerides 74.0 0.0 - 149.0 mg/dL   HDL 41.50 >39.00 mg/dL   VLDL 14.8 0.0 - 40.0 mg/dL   LDL Cholesterol 48 0 - 99 mg/dL   Total CHOL/HDL Ratio 3    NonHDL 62.60   Hemoglobin A1c  Result Value Ref Range   Hgb A1c MFr Bld 6.8 (H) 4.6 - 6.5 %      Assessment & Plan:   Problem List Items Addressed This Visit    Diabetes mellitus type 2, controlled, with complications - Primary    Great control - discussed with patient. Congratulated! Pt attributes to changing lantus to night time and increasing water intake. Continue current regimen.      HTN (hypertension)    Chronic, stable. Continue current regimen.      HLD (hyperlipidemia)    Markedly improved levels on lovastatin. Pt attributes to better sugar control. Continue.       CAD (coronary artery disease)    Stable on medical management.      Osteopenia    Pt endorses compliance with vit D.         Other Visit Diagnoses    Need for prophylactic vaccination and inoculation against influenza        Relevant Orders    Flu Vaccine QUAD 36+ mos PF IM (Fluarix & Fluzone Quad PF) (Completed)        Follow up plan: Return in about 6 months (around 09/20/2015), or as needed, for medicare wellness.

## 2015-03-23 NOTE — Assessment & Plan Note (Addendum)
Markedly improved levels on lovastatin. Pt attributes to better sugar control. Continue.

## 2015-03-23 NOTE — Assessment & Plan Note (Signed)
Pt endorses compliance with vit D.

## 2015-03-23 NOTE — Assessment & Plan Note (Signed)
Chronic, stable. Continue current regimen. 

## 2015-03-23 NOTE — Patient Instructions (Addendum)
Flu shot today Continue current regimen, continue drinking plenty of water. Labs are looking wonderful today! I'm glad you are doing well. Return in 6 months for medicare wellness visit.

## 2015-03-23 NOTE — Assessment & Plan Note (Signed)
Great control - discussed with patient. Congratulated! Pt attributes to changing lantus to night time and increasing water intake. Continue current regimen.

## 2015-03-23 NOTE — Addendum Note (Signed)
Addended by: Ria Bush on: 03/23/2015 09:22 AM   Modules accepted: Orders

## 2015-03-23 NOTE — Progress Notes (Signed)
Pre visit review using our clinic review tool, if applicable. No additional management support is needed unless otherwise documented below in the visit note. 

## 2015-04-12 ENCOUNTER — Other Ambulatory Visit: Payer: Self-pay | Admitting: Dermatology

## 2015-04-23 ENCOUNTER — Encounter: Payer: Self-pay | Admitting: Family Medicine

## 2015-05-20 LAB — HM DIABETES EYE EXAM

## 2015-05-26 ENCOUNTER — Encounter: Payer: Self-pay | Admitting: Family Medicine

## 2015-06-15 ENCOUNTER — Other Ambulatory Visit: Payer: Self-pay | Admitting: Dermatology

## 2015-07-27 ENCOUNTER — Other Ambulatory Visit: Payer: Self-pay

## 2015-07-27 MED ORDER — GLUCOSE BLOOD VI STRP: ORAL_STRIP | Status: DC

## 2015-07-27 NOTE — Telephone Encounter (Signed)
[facility] left v/m requesting rx sent for onetouch ultra test strip; spoke with pt no longer uses bayer test strip and request one touch ultra test strip testing once or twice daily. Advised pt done per protocol.

## 2015-07-29 ENCOUNTER — Telehealth: Payer: Self-pay

## 2015-07-29 MED ORDER — INSULIN GLARGINE 100 UNIT/ML ~~LOC~~ SOLN: 36.0000 [IU] | Freq: Every day | SUBCUTANEOUS | Status: DC

## 2015-07-29 NOTE — Telephone Encounter (Signed)
36u sent into pharmacy

## 2015-07-29 NOTE — Telephone Encounter (Signed)
Norman Marks with [facility] request clarification of how pt should be taking lantus. Instructions have take 40U daily but also notes taking differently at 36 U at hs. Brianna request cb next week with which directions are correct.

## 2015-08-03 ENCOUNTER — Encounter: Payer: Self-pay | Admitting: Cardiovascular Disease

## 2015-08-03 ENCOUNTER — Ambulatory Visit (INDEPENDENT_AMBULATORY_CARE_PROVIDER_SITE_OTHER): Payer: Medicare Other | Admitting: Cardiovascular Disease

## 2015-08-03 VITALS — BP 150/60 | HR 86 | Ht 69.0 in | Wt 170.5 lb

## 2015-08-03 DIAGNOSIS — E785 Hyperlipidemia, unspecified: Secondary | ICD-10-CM | POA: Diagnosis not present

## 2015-08-03 DIAGNOSIS — I2510 Atherosclerotic heart disease of native coronary artery without angina pectoris: Secondary | ICD-10-CM

## 2015-08-03 DIAGNOSIS — I447 Left bundle-branch block, unspecified: Secondary | ICD-10-CM | POA: Diagnosis not present

## 2015-08-03 DIAGNOSIS — I10 Essential (primary) hypertension: Secondary | ICD-10-CM

## 2015-08-03 DIAGNOSIS — R0602 Shortness of breath: Secondary | ICD-10-CM | POA: Diagnosis not present

## 2015-08-03 MED ORDER — CARVEDILOL 25 MG PO TABS: 25.0000 mg | ORAL_TABLET | Freq: Two times a day (BID) | ORAL | Status: DC

## 2015-08-03 NOTE — Progress Notes (Signed)
Primary care physician:[name]  HPI  This is a pleasant 70 year old male who is here today for a follow-up visit regarding coronary artery disease and left bundle branch block.  He had cardiac catheterization in 2009 which showed one-vessel coronary artery disease involving the proximal ramus (70% stenosis).  Most recent nuclear stress test in June 2015 showed a fixed anteroseptal and inferoseptal defects with normal ejection fraction. The defects were felt to be due to left bundle branch block. He has known history of hypertension, diabetes and hyperlipidemia. He reports worsening exertional dyspnea without chest pain. No orthopnea, PND or leg edema.  Allergies  Allergen Reactions  . Metformin And Related Diarrhea    With higher doses, tolerating QD dosing well  . Percocet [Oxycodone-Acetaminophen] Itching     Current Outpatient Prescriptions on File Prior to Visit  Medication Sig Dispense Refill  . aspirin 81 MG tablet Take 81 mg by mouth daily.    . B-D INS SYR ULTRAFINE 1CC/31G 31G X 5/16" 1 ML MISC USE TO INJECT INSULIN AS DIRECTED. 90 each 2  . carvedilol (COREG) 12.5 MG tablet Take 1 tablet (12.5 mg total) by mouth 2 (two) times daily with a meal. 60 tablet 11  . cholecalciferol (VITAMIN D) 1000 UNITS tablet Take 1,000 Units by mouth daily.    Marland Kitchen glucosamine-chondroitin (MAX GLUCOSAMINE CHONDROITIN) 500-400 MG tablet Take 1 tablet by mouth 2 (two) times daily.    Marland Kitchen glucose blood (ONE TOUCH ULTRA TEST) test strip Check blood sugar once to twice daily and as directed. Dx E11.8 100 each 5  . insulin glargine (LANTUS) 100 UNIT/ML injection Inject 0.36 mLs (36 Units total) into the skin at bedtime. 10 mL 11  . Insulin Pen Needle (EXEL PEN NEEDLES 31GX1/4") 31G X 6 MM MISC Use as directed 100 each 3  . lovastatin (MEVACOR) 40 MG tablet Take 1 tablet (40 mg total) by mouth at bedtime. 30 tablet 11  . metFORMIN (GLUCOPHAGE) 1000 MG tablet Take 1 tablet (1,000 mg total) by mouth daily  with breakfast. 30 tablet 11  . ramipril (ALTACE) 10 MG capsule Take 1 capsule (10 mg total) by mouth daily. 30 capsule 11  . sitaGLIPtin (JANUVIA) 100 MG tablet Take 1 tablet (100 mg total) by mouth daily. 30 tablet 11   No current facility-administered medications on file prior to visit.     Past Medical History  Diagnosis Date  . History of chicken pox   . Diabetes type 2, uncontrolled ([place])   . Hx of migraines   . BPH (benign prostatic hypertrophy)   . HTN (hypertension)     mild  . HLD (hyperlipidemia)   . CAD (coronary artery disease) 2009    [name] (cardiolite 2009 with 70% stenosis of proximal ramus, not amenable to PCI, rec treat medically)  . Osteopenia 06/2011    T score -3, completed 2 yrs forteo, T score improved to -2.2 (2015)  . History of nephrolithiasis 2012    L ureter, R renal pelvis  . DDD (degenerative disc disease), lumbar     s/p surgery  . Vitamin D deficiency   . History of bundle branch block   . History of adenomatous polyp of colon 10/2012  . Osteoarthritis of [place] joint of thumb 2014    s/p injections ([place])  . Osteoarthritis of right shoulder 2015    s/p injections ([place])  . PONV (postoperative nausea and vomiting)   . Heart murmur   . Dysrhythmia   . Diabetes mellitus without  complication ([place])   . Headache(784.0)   . Cancer ([place])     basal cell skin cancer     Past Surgical History  Procedure Laterality Date  . Back surgery  1994, 2008    lumbar; x 3, spinal fusion  . Knee surgery Right   . Shoulder surgery Right   . US echocardiography  10/2008    nl LV fxn, EF 50%, mild MR, mild pulm HTN  . Skin surgery  2006    skin cancer  . Cardiovascular stress test  2011    fixed defect inferior/septal walls without reversibility, LVEF 43% post stress  . Dexa  06/2011    osteoporosis T -2.6 spine, -1.9 hip  . Flexible sigmoidoscopy  2007    pt reported normal ([place])  . Colonoscopy  10/2012    tubular adenoma, mild diverticulosis,  rpt 5 yrs Deatra Ina)  . Dexa  07/2013    osteopenia T -2.2 femur, -1.2 spine  . Cardiac catheterization  2009    70% stenosis ostium  . Total shoulder arthroplasty Right 01/26/2014    Procedure: TOTAL SHOULDER ARTHROPLASTY;  Surgeon: Johnny Bridge, MD;  Location: [facility];  Service: Orthopedics;  Laterality: Right;     Family History  Problem Relation Age of Onset  . Cancer Father     lung and prostate  . Diabetes Father   . Heart disease Mother   . Cancer Mother     unsure  . Coronary artery disease Sister 18  . Heart attack Sister 65  . Diabetes Brother   . Stroke Neg Hx   . Colon cancer Neg Hx      Social History   Social History  . Marital Status: Married    Spouse Name: N/A  . Number of Children: N/A  . Years of Education: N/A   Occupational History  . Not on file.   Social History Main Topics  . Smoking status: Former Smoker -- 0.25 packs/day for 10 years    Types: Cigarettes    Quit date: 10/20/1992  . Smokeless tobacco: Former Systems developer    Types: [place] date: 10/20/1992  . Alcohol Use: No  . Drug Use: No  . Sexual Activity: Not on file   Other Topics Concern  . Not on file   Social History Narrative   Caffeine: 1 cup coffee/day   Lives with wife, 1 dog, 5 cats.   Occupation: retired   Edu: 12th grade   Activity: no regular exercise   Diet: good water, fruits/vegetables daily     ROS A 10 point review of system was performed. It is negative other than that mentioned in the history of present illness.   PHYSICAL EXAM   BP 150/60 mmHg  Pulse 86  Ht 5\' 9"  (1.753 m)  Wt 170 lb 8 oz (77.338 kg)  BMI 25.17 kg/m2 Constitutional: He is oriented to person, place, and time. He appears well-developed and well-nourished. No distress.  HENT: No nasal discharge.  Head: Normocephalic and atraumatic.  Eyes: Pupils are equal and round.  No discharge. Neck: Normal range of motion. Neck supple. No JVD present. No thyromegaly present.  Cardiovascular:  Normal rate, regular rhythm, normal heart sounds. Exam reveals no gallop and no friction rub. 2/6 holosystolic murmur at the apex.  Pulmonary/Chest: Effort normal and breath sounds normal. No stridor. No respiratory distress. He has no wheezes. He has no rales. He exhibits no tenderness.  Abdominal: Soft. Bowel sounds are normal. He  exhibits no distension. There is no tenderness. There is no rebound and no guarding.  Musculoskeletal: Normal range of motion. He exhibits no edema and no tenderness.  Neurological: He is alert and oriented to person, place, and time. Coordination normal.  Skin: Skin is warm and dry. No rash noted. He is not diaphoretic. No erythema. No pallor.  Psychiatric: He has a normal mood and affect. His behavior is normal. Judgment and thought content normal.       EKG: Sinus rhythm with  left bundle branch block.   ASSESSMENT AND PLAN

## 2015-08-03 NOTE — Assessment & Plan Note (Signed)
The patient reports worsening exertional dyspnea and by exam, he has a new apical murmur suggestive of mitral regurgitation. I requested an echocardiogram for evaluation.

## 2015-08-03 NOTE — Assessment & Plan Note (Signed)
The patient has history of one-vessel coronary artery disease being treated medically. He has no convincing symptoms of angina. Most recent nuclear stress test in 2015 showed no evidence of ischemia. Continue medical therapy.

## 2015-08-03 NOTE — Assessment & Plan Note (Signed)
Blood pressure is elevated and heart rate tends to be somewhat on the high side. Thus, I increased the dose of carvedilol to 25 mg twice daily.

## 2015-08-03 NOTE — Patient Instructions (Addendum)
Medication Instructions:  Your physician has recommended you make the following change in your medication:  INCREASE coreg to 25mg  twice daily   Labwork: none  Testing/Procedures: Your physician has requested that you have an echocardiogram. Echocardiography is a painless test that uses sound waves to create images of your heart. It provides your doctor with information about the size and shape of your heart and how well your heart's chambers and valves are working. This procedure takes approximately one hour. There are no restrictions for this procedure.    Follow-Up: Your physician wants you to follow-up in: six months with Dr. Fletcher Anon.  You will receive a reminder letter in the mail two months in advance. If you don't receive a letter, please call our office to schedule the follow-up appointment.   Any Other Special Instructions Will Be Listed Below (If Applicable).     If you need a refill on your cardiac medications before your next appointment, please call your pharmacy.

## 2015-08-03 NOTE — Assessment & Plan Note (Signed)
Lab Results  Component Value Date   CHOL 104 03/16/2015   HDL 41.50 03/16/2015   LDLCALC 48 03/16/2015   TRIG 74.0 03/16/2015   CHOLHDL 3 03/16/2015   Continue treatment with lovastatin.

## 2015-08-08 ENCOUNTER — Ambulatory Visit: Payer: Medicare Other | Admitting: Cardiovascular Disease

## 2015-08-09 ENCOUNTER — Other Ambulatory Visit: Payer: Self-pay

## 2015-08-09 ENCOUNTER — Ambulatory Visit (INDEPENDENT_AMBULATORY_CARE_PROVIDER_SITE_OTHER): Payer: Medicare Other

## 2015-08-09 DIAGNOSIS — R0602 Shortness of breath: Secondary | ICD-10-CM

## 2015-09-09 ENCOUNTER — Ambulatory Visit: Payer: Medicare Other | Admitting: Cardiovascular Disease

## 2015-09-11 ENCOUNTER — Other Ambulatory Visit: Payer: Self-pay | Admitting: Family Medicine

## 2015-09-11 DIAGNOSIS — I10 Essential (primary) hypertension: Secondary | ICD-10-CM

## 2015-09-11 DIAGNOSIS — E785 Hyperlipidemia, unspecified: Secondary | ICD-10-CM

## 2015-09-11 DIAGNOSIS — E349 Endocrine disorder, unspecified: Secondary | ICD-10-CM

## 2015-09-11 DIAGNOSIS — Z794 Long term (current) use of insulin: Principal | ICD-10-CM

## 2015-09-11 DIAGNOSIS — R7401 Elevation of levels of liver transaminase levels: Secondary | ICD-10-CM

## 2015-09-11 DIAGNOSIS — M8580 Other specified disorders of bone density and structure, unspecified site: Secondary | ICD-10-CM

## 2015-09-11 DIAGNOSIS — E118 Type 2 diabetes mellitus with unspecified complications: Secondary | ICD-10-CM

## 2015-09-11 DIAGNOSIS — E559 Vitamin D deficiency, unspecified: Secondary | ICD-10-CM

## 2015-09-11 DIAGNOSIS — R740 Nonspecific elevation of levels of transaminase and lactic acid dehydrogenase [LDH]: Secondary | ICD-10-CM

## 2015-09-11 DIAGNOSIS — N400 Enlarged prostate without lower urinary tract symptoms: Secondary | ICD-10-CM

## 2015-09-15 ENCOUNTER — Other Ambulatory Visit (INDEPENDENT_AMBULATORY_CARE_PROVIDER_SITE_OTHER): Payer: Medicare Other

## 2015-09-15 ENCOUNTER — Other Ambulatory Visit: Payer: Self-pay | Admitting: Family Medicine

## 2015-09-15 DIAGNOSIS — N400 Enlarged prostate without lower urinary tract symptoms: Secondary | ICD-10-CM

## 2015-09-15 DIAGNOSIS — Z794 Long term (current) use of insulin: Secondary | ICD-10-CM

## 2015-09-15 DIAGNOSIS — I10 Essential (primary) hypertension: Secondary | ICD-10-CM

## 2015-09-15 DIAGNOSIS — M8580 Other specified disorders of bone density and structure, unspecified site: Secondary | ICD-10-CM

## 2015-09-15 DIAGNOSIS — E118 Type 2 diabetes mellitus with unspecified complications: Secondary | ICD-10-CM | POA: Diagnosis not present

## 2015-09-15 DIAGNOSIS — Z1159 Encounter for screening for other viral diseases: Secondary | ICD-10-CM | POA: Diagnosis not present

## 2015-09-15 DIAGNOSIS — E559 Vitamin D deficiency, unspecified: Secondary | ICD-10-CM

## 2015-09-15 DIAGNOSIS — R740 Nonspecific elevation of levels of transaminase and lactic acid dehydrogenase [LDH]: Secondary | ICD-10-CM

## 2015-09-15 DIAGNOSIS — R7401 Elevation of levels of liver transaminase levels: Secondary | ICD-10-CM

## 2015-09-15 DIAGNOSIS — E785 Hyperlipidemia, unspecified: Secondary | ICD-10-CM

## 2015-09-15 DIAGNOSIS — E349 Endocrine disorder, unspecified: Secondary | ICD-10-CM

## 2015-09-15 LAB — MICROALBUMIN / CREATININE URINE RATIO
CREATININE, U: 169.1 mg/dL
MICROALB UR: 1.6 mg/dL (ref 0.0–1.9)
Microalb Creat Ratio: 0.9 mg/g (ref 0.0–30.0)

## 2015-09-15 LAB — HEMOGLOBIN A1C: Hgb A1c MFr Bld: 7.5 % — ABNORMAL HIGH (ref 4.6–6.5)

## 2015-09-16 LAB — HEPATIC FUNCTION PANEL
ALBUMIN: 4.2 g/dL (ref 3.6–5.1)
ALK PHOS: 63 U/L (ref 40–115)
ALT: 15 U/L (ref 9–46)
AST: 15 U/L (ref 10–35)
BILIRUBIN INDIRECT: 0.5 mg/dL (ref 0.2–1.2)
Bilirubin, Direct: 0.1 mg/dL (ref <=0.2)
TOTAL PROTEIN: 6.1 g/dL (ref 6.1–8.1)
Total Bilirubin: 0.6 mg/dL (ref 0.2–1.2)

## 2015-09-16 LAB — BASIC METABOLIC PANEL
BUN: 18 mg/dL (ref 7–25)
CHLORIDE: 107 mmol/L (ref 98–110)
CO2: 26 mmol/L (ref 20–31)
Calcium: 8.9 mg/dL (ref 8.6–10.3)
Creat: 0.69 mg/dL — ABNORMAL LOW (ref 0.70–1.18)
GLUCOSE: 167 mg/dL — AB (ref 65–99)
Potassium: 4.5 mmol/L (ref 3.5–5.3)
Sodium: 141 mmol/L (ref 135–146)

## 2015-09-16 LAB — LDL CHOLESTEROL, DIRECT: LDL DIRECT: 100 mg/dL (ref <130)

## 2015-09-16 LAB — TESTOSTERONE: TESTOSTERONE: 209 ng/dL — AB (ref 250–827)

## 2015-09-16 LAB — HEPATITIS C ANTIBODY: HCV AB: NEGATIVE

## 2015-09-16 LAB — FOLLICLE STIMULATING HORMONE: FSH: 11 m[IU]/mL — ABNORMAL HIGH (ref 1.6–8.0)

## 2015-09-16 LAB — TSH: TSH: 0.85 mIU/L (ref 0.40–4.50)

## 2015-09-16 LAB — LUTEINIZING HORMONE: LH: 6.1 m[IU]/mL (ref 1.6–15.2)

## 2015-09-17 LAB — VITAMIN D 25 HYDROXY (VIT D DEFICIENCY, FRACTURES): Vit D, 25-Hydroxy: 39 ng/mL (ref 30–100)

## 2015-09-17 LAB — PSA: PSA: 1.97 ng/mL (ref <=4.00)

## 2015-09-22 ENCOUNTER — Encounter: Payer: Self-pay | Admitting: Family Medicine

## 2015-09-22 ENCOUNTER — Ambulatory Visit (INDEPENDENT_AMBULATORY_CARE_PROVIDER_SITE_OTHER): Payer: Medicare Other | Admitting: Family Medicine

## 2015-09-22 ENCOUNTER — Other Ambulatory Visit: Payer: Self-pay | Admitting: *Deleted

## 2015-09-22 VITALS — BP 142/78 | HR 96 | Temp 98.2°F | Wt 168.0 lb

## 2015-09-22 DIAGNOSIS — M8580 Other specified disorders of bone density and structure, unspecified site: Secondary | ICD-10-CM

## 2015-09-22 DIAGNOSIS — E559 Vitamin D deficiency, unspecified: Secondary | ICD-10-CM

## 2015-09-22 DIAGNOSIS — Z0000 Encounter for general adult medical examination without abnormal findings: Secondary | ICD-10-CM

## 2015-09-22 DIAGNOSIS — I2510 Atherosclerotic heart disease of native coronary artery without angina pectoris: Secondary | ICD-10-CM

## 2015-09-22 DIAGNOSIS — E785 Hyperlipidemia, unspecified: Secondary | ICD-10-CM

## 2015-09-22 DIAGNOSIS — N138 Other obstructive and reflux uropathy: Secondary | ICD-10-CM

## 2015-09-22 DIAGNOSIS — Z794 Long term (current) use of insulin: Secondary | ICD-10-CM

## 2015-09-22 DIAGNOSIS — N401 Enlarged prostate with lower urinary tract symptoms: Secondary | ICD-10-CM

## 2015-09-22 DIAGNOSIS — I10 Essential (primary) hypertension: Secondary | ICD-10-CM

## 2015-09-22 DIAGNOSIS — Z7189 Other specified counseling: Secondary | ICD-10-CM

## 2015-09-22 DIAGNOSIS — E349 Endocrine disorder, unspecified: Secondary | ICD-10-CM

## 2015-09-22 DIAGNOSIS — E118 Type 2 diabetes mellitus with unspecified complications: Secondary | ICD-10-CM

## 2015-09-22 MED ORDER — SITAGLIPTIN PHOSPHATE 100 MG PO TABS: 100.0000 mg | ORAL_TABLET | Freq: Every day | ORAL | Status: DC

## 2015-09-22 MED ORDER — RAMIPRIL 10 MG PO CAPS: 10.0000 mg | ORAL_CAPSULE | Freq: Every day | ORAL | Status: DC

## 2015-09-22 MED ORDER — INSULIN GLARGINE 100 UNIT/ML ~~LOC~~ SOLN: 36.0000 [IU] | Freq: Every day | SUBCUTANEOUS | Status: DC

## 2015-09-22 MED ORDER — METFORMIN HCL 1000 MG PO TABS: 1000.0000 mg | ORAL_TABLET | Freq: Every day | ORAL | Status: DC

## 2015-09-22 MED ORDER — LOVASTATIN 40 MG PO TABS: 40.0000 mg | ORAL_TABLET | Freq: Every day | ORAL | Status: DC

## 2015-09-22 NOTE — Assessment & Plan Note (Signed)
repleted with 2000 IU vit D.

## 2015-09-22 NOTE — Assessment & Plan Note (Addendum)
Chronic mildly deteriorated. Discussed with patient. Will refer for diabetes education. RTC 4 mo DM f/u

## 2015-09-22 NOTE — Assessment & Plan Note (Signed)
Chronic, stable. Mildly elevated today - improved on recheck.

## 2015-09-22 NOTE — Assessment & Plan Note (Signed)
Appreciate cardiology care of patient.  

## 2015-09-22 NOTE — Progress Notes (Signed)
BP 142/78 mmHg  Pulse 96  Temp(Src) 98.2 F (36.8 C) (Oral)  Wt 168 lb (76.204 kg)   CC: medicare wellness visit  Subjective:    Patient ID: Norman Marks, male    DOB: [birth year], 70 y.o.   MRN: [id number]  HPI: Norman Marks is a 70 y.o. male presenting on 09/22/2015 for Annual Exam   More trouble controlling sugars. More brittle diabetes. Does have scheduled meals.   Passes hearing screen today.  Vision exam with eye clinic yearly. No falls  No depression/anhedonia.   Preventative: COLONOSCOPY Date: 10/2012 tubular adenoma, mild diverticulosis, rpt 5 yrs Deatra Ina) Prostate cancer screening - yearly. 1-3 nocturia. Some hesitancy.  DEXA date: 07/2013 osteopenia T -2.2 femur, -1.2 spine Flu yearly penumovax 06/28/2010. prevnar 09/2013 Tetanus around 2011 zostavax - 01/2013 Advanced directive: doesn't have one set up. Has talked about this with wife.Wife would be HCPOA.Does not want drastic measures. Seat belt use discussed Sunscreen use discussed. No changing moles on skin. Sees derm regularly  Caffeine: 1 cup coffee/day  Lives with wife, 1 dog, 5 cats.  Occupation: retired  Edu: 12th grade  Activity: no regular exercise. Diet: good water, fruits/vegetables daily   Relevant past medical, surgical, family and social history reviewed and updated as indicated. Interim medical history since our last visit reviewed. Allergies and medications reviewed and updated. Current Outpatient Prescriptions on File Prior to Visit  Medication Sig  . aspirin 81 MG tablet Take 81 mg by mouth daily.  . B-D INS SYR ULTRAFINE 1CC/31G 31G X 5/16" 1 ML MISC USE TO INJECT INSULIN AS DIRECTED.  Marland Kitchen carvedilol (COREG) 25 MG tablet Take 1 tablet (25 mg total) by mouth 2 (two) times daily with a meal.  . glucosamine-chondroitin (MAX GLUCOSAMINE CHONDROITIN) 500-400 MG tablet Take 1 tablet by mouth 2 (two) times daily.  Marland Kitchen glucose blood (ONE TOUCH ULTRA TEST) test strip Check blood sugar once  to twice daily and as directed. Dx E11.8  . insulin glargine (LANTUS) 100 UNIT/ML injection Inject 0.36 mLs (36 Units total) into the skin at bedtime. (Patient taking differently: Inject 36-40 Units into the skin at bedtime. )  . Insulin Pen Needle (EXEL PEN NEEDLES 31GX1/4") 31G X 6 MM MISC Use as directed  . lovastatin (MEVACOR) 40 MG tablet Take 1 tablet (40 mg total) by mouth at bedtime.  . metFORMIN (GLUCOPHAGE) 1000 MG tablet Take 1 tablet (1,000 mg total) by mouth daily with breakfast.  . ramipril (ALTACE) 10 MG capsule Take 1 capsule (10 mg total) by mouth daily.  . sitaGLIPtin (JANUVIA) 100 MG tablet Take 1 tablet (100 mg total) by mouth daily.   No current facility-administered medications on file prior to visit.    Review of Systems  Constitutional: Negative for fever, chills, activity change, appetite change, fatigue and unexpected weight change.  HENT: Negative for hearing loss.   Eyes: Negative for visual disturbance.  Respiratory: Positive for shortness of breath (mild with exertion). Negative for cough, chest tightness and wheezing.   Cardiovascular: Negative for chest pain, palpitations and leg swelling.  Gastrointestinal: Negative for nausea, vomiting, abdominal pain, diarrhea, constipation, blood in stool and abdominal distention.  Genitourinary: Negative for hematuria and difficulty urinating.  Musculoskeletal: Negative for myalgias, arthralgias and neck pain.  Skin: Negative for rash.  Neurological: Positive for headaches. Negative for dizziness, seizures and syncope.  Hematological: Negative for adenopathy. Does not bruise/bleed easily.  Psychiatric/Behavioral: Negative for dysphoric mood. The patient is not nervous/anxious.    Per  HPI unless specifically indicated in ROS section     Objective:    BP 142/78 mmHg  Pulse 96  Temp(Src) 98.2 F (36.8 C) (Oral)  Wt 168 lb (76.204 kg)  Wt Readings from Last 3 Encounters:  09/22/15 168 lb (76.204 kg)  08/03/15 170  lb 8 oz (77.338 kg)  03/23/15 165 lb 8 oz (75.07 kg)    Physical Exam  Constitutional: He is oriented to person, place, and time. He appears well-developed and well-nourished. No distress.  HENT:  Head: Normocephalic and atraumatic.  Right Ear: Hearing, tympanic membrane, external ear and ear canal normal.  Left Ear: Hearing, tympanic membrane, external ear and ear canal normal.  Nose: Nose normal.  Mouth/Throat: Uvula is midline, oropharynx is clear and moist and mucous membranes are normal. No oropharyngeal exudate, posterior oropharyngeal edema or posterior oropharyngeal erythema.  Eyes: Conjunctivae and EOM are normal. Pupils are equal, round, and reactive to light. No scleral icterus.  Neck: Normal range of motion. Neck supple. Carotid bruit is not present. No thyromegaly present.  Cardiovascular: Normal rate, regular rhythm, normal heart sounds and intact distal pulses.   No murmur heard. Pulses:      Radial pulses are 2+ on the right side, and 2+ on the left side.  Pulmonary/Chest: Effort normal and breath sounds normal. No respiratory distress. He has no wheezes. He has no rales.  Abdominal: Soft. Bowel sounds are normal. He exhibits no distension and no mass. There is no tenderness. There is no rebound and no guarding.  Genitourinary: Rectum normal. Rectal exam shows no external hemorrhoid, no internal hemorrhoid, no fissure, no mass, no tenderness and anal tone normal. Prostate is enlarged (25gm). Prostate is not tender.  Musculoskeletal: Normal range of motion. He exhibits no edema.  Lymphadenopathy:    He has no cervical adenopathy.  Neurological: He is alert and oriented to person, place, and time.  CN grossly intact, station and gait intact Recall 3/3 Calculation 5/5 serial 7s  Skin: Skin is warm and dry. No rash noted.  Psychiatric: He has a normal mood and affect. His behavior is normal. Judgment and thought content normal.  Nursing note and vitals reviewed.  Results  for orders placed or performed in visit on 09/15/15  Hemoglobin A1c  Result Value Ref Range   Hgb A1c MFr Bld 7.5 (H) 4.6 - 6.5 %  Microalbumin / creatinine urine ratio  Result Value Ref Range   Microalb, Ur 1.6 0.0 - 1.9 mg/dL   Creatinine,U 169.1 mg/dL   Microalb Creat Ratio 0.9 0.0 - 30.0 mg/g   Lab Results  Component Value Date   CHOL 104 03/16/2015   HDL 41.50 03/16/2015   LDLCALC 48 03/16/2015   LDLDIRECT 100 09/15/2015   TRIG 74.0 03/16/2015   CHOLHDL 3 03/16/2015        Assessment & Plan:   Problem List Items Addressed This Visit    Vitamin D deficiency    repleted with 2000 IU vit D.      Osteopenia    Continue vit D. Check DEXA in next 1 year.      Medicare annual wellness visit, subsequent - Primary    I have personally reviewed the Medicare Annual Wellness questionnaire and have noted 1. The patient's medical and social history 2. Their use of alcohol, tobacco or illicit drugs 3. Their current medications and supplements 4. The patient's functional ability including ADL's, fall risks, home safety risks and hearing or visual impairment. Cognitive function has been assessed  and addressed as indicated.  5. Diet and physical activity 6. Evidence for depression or mood disorders The patients weight, height, BMI have been recorded in the chart. I have made referrals, counseling and provided education to the patient based on review of the above and I have provided the pt with a written personalized care plan for preventive services. Provider list updated.. See scanned questionairre as needed for further documentation. Reviewed preventative protocols and updated unless pt declined.       Hypotestosteronism    Topical testosterone did not improve readings or symptoms and due to cost pt decided to forgo treatment. Continue to monitor - if developing osteoporosis, would recommend treatment, consider IM.       HTN (hypertension)    Chronic, stable. Mildly elevated  today - improved on recheck.       HLD (hyperlipidemia)    LDL not at goal. Pt reports compliance with lovastatin. Recheck next visit and if persistently elevated will change to more potent statin.      Health maintenance examination    Preventative protocols reviewed and updated unless pt declined. Discussed healthy diet and lifestyle.       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 Discussed with patient. Will refer for diabetes education. RTC 4 mo DM f/u      Relevant Orders   Ambulatory referral to diabetic education   CAD (coronary artery disease)    Appreciate cardiology care of patient.      BPH with obstruction/lower urinary tract symptoms    Enlarged, more sxs noted. Declines treatment for now.       Advanced care planning/counseling discussion    Advanced directive: doesn't have one set up. Has talked about this with wife.Wife would be HCPOA.Does not want drastic measures.          Follow up plan: Return in about 4 months (around 01/22/2016), or as needed, for follow up visit.

## 2015-09-22 NOTE — Assessment & Plan Note (Signed)
Preventative protocols reviewed and updated unless pt declined. Discussed healthy diet and lifestyle.  

## 2015-09-22 NOTE — Progress Notes (Signed)
Pre visit review using our clinic review tool, if applicable. No additional management support is needed unless otherwise documented below in the visit note. 

## 2015-09-22 NOTE — Assessment & Plan Note (Signed)
Continue vit D. Check DEXA in next 1 year.

## 2015-09-22 NOTE — Patient Instructions (Addendum)
We will refer you to diabetes education classes.  Work on Scientist, physiological.  Return in 4 months for diabetes follow up.   Health Maintenance, Male A healthy lifestyle and preventative care can promote health and wellness.  Maintain regular health, dental, and eye exams.  Eat a healthy diet. Foods like vegetables, fruits, whole grains, low-fat dairy products, and lean protein foods contain the nutrients you need and are low in calories. Decrease your intake of foods high in solid fats, added sugars, and salt. Get information about a proper diet from your health care provider, if necessary.  Regular physical exercise is one of the most important things you can do for your health. Most adults should get at least 150 minutes of moderate-intensity exercise (any activity that increases your heart rate and causes you to sweat) each week. In addition, most adults need muscle-strengthening exercises on 2 or more days a week.   Maintain a healthy weight. The body mass index (BMI) is a screening tool to identify possible weight problems. It provides an estimate of body fat based on height and weight. Your health care provider can find your BMI and can help you achieve or maintain a healthy weight. For males 20 years and older:  A BMI below 18.5 is considered underweight.  A BMI of 18.5 to 24.9 is normal.  A BMI of 25 to 29.9 is considered overweight.  A BMI of 30 and above is considered obese.  Maintain normal blood lipids and cholesterol by exercising and minimizing your intake of saturated fat. Eat a balanced diet with plenty of fruits and vegetables. Blood tests for lipids and cholesterol should begin at age 7 and be repeated every 5 years. If your lipid or cholesterol levels are high, you are over age 22, or you are at high risk for heart disease, you may need your cholesterol levels checked more frequently.Ongoing high lipid and cholesterol levels should be treated with medicines if diet and  exercise are not working.  If you smoke, find out from your health care provider how to quit. If you do not use tobacco, do not start.  Lung cancer screening is recommended for adults aged 21-80 years who are at high risk for developing lung cancer because of a history of smoking. A yearly low-dose CT scan of the lungs is recommended for people who have at least a 30-pack-year history of smoking and are current smokers or have quit within the past 15 years. A pack year of smoking is smoking an average of 1 pack of cigarettes a day for 1 year (for example, a 30-pack-year history of smoking could mean smoking 1 pack a day for 30 years or 2 packs a day for 15 years). Yearly screening should continue until the smoker has stopped smoking for at least 15 years. Yearly screening should be stopped for people who develop a health problem that would prevent them from having lung cancer treatment.  If you choose to drink alcohol, do not have more than 2 drinks per day. One drink is considered to be 12 oz (360 mL) of beer, 5 oz (150 mL) of wine, or 1.5 oz (45 mL) of liquor.  Avoid the use of street drugs. Do not share needles with anyone. Ask for help if you need support or instructions about stopping the use of drugs.  High blood pressure causes heart disease and increases the risk of stroke. High blood pressure is more likely to develop in:  People who have blood pressure  in the end of the normal range (100-139/85-89 mm Hg).  People who are overweight or obese.  People who are African American.  If you are 55-74 years of age, have your blood pressure checked every 3-5 years. If you are 79 years of age or older, have your blood pressure checked every year. You should have your blood pressure measured twice--once when you are at a hospital or clinic, and once when you are not at a hospital or clinic. Record the average of the two measurements. To check your blood pressure when you are not at a hospital or  clinic, you can use:  An automated blood pressure machine at a pharmacy.  A home blood pressure monitor.  If you are 43-52 years old, ask your health care provider if you should take aspirin to prevent heart disease.  Diabetes screening involves taking a blood sample to check your fasting blood sugar level. This should be done once every 3 years after age 73 if you are at a normal weight and without risk factors for diabetes. Testing should be considered at a younger age or be carried out more frequently if you are overweight and have at least 1 risk factor for diabetes.  Colorectal cancer can be detected and often prevented. Most routine colorectal cancer screening begins at the age of 54 and continues through age 83. However, your health care provider may recommend screening at an earlier age if you have risk factors for colon cancer. On a yearly basis, your health care provider may provide home test kits to check for hidden blood in the stool. A small camera at the end of a tube may be used to directly examine the colon (sigmoidoscopy or colonoscopy) to detect the earliest forms of colorectal cancer. Talk to your health care provider about this at age 64 when routine screening begins. A direct exam of the colon should be repeated every 5-10 years through age 53, unless early forms of precancerous polyps or small growths are found.  People who are at an increased risk for hepatitis B should be screened for this virus. You are considered at high risk for hepatitis B if:  You were born in a country where hepatitis B occurs often. Talk with your health care provider about which countries are considered high risk.  Your parents were born in a high-risk country and you have not received a shot to protect against hepatitis B (hepatitis B vaccine).  You have HIV or AIDS.  You use needles to inject street drugs.  You live with, or have sex with, someone who has hepatitis B.  You are a man who has  sex with other men (MSM).  You get hemodialysis treatment.  You take certain medicines for conditions like cancer, organ transplantation, and autoimmune conditions.  Hepatitis C blood testing is recommended for all people born from [birth year] through 1966 and any individual with known risk factors for hepatitis C.  Healthy men should no longer receive prostate-specific antigen (PSA) blood tests as part of routine cancer screening. Talk to your health care provider about prostate cancer screening.  Testicular cancer screening is not recommended for adolescents or adult males who have no symptoms. Screening includes self-exam, a health care provider exam, and other screening tests. Consult with your health care provider about any symptoms you have or any concerns you have about testicular cancer.  Practice safe sex. Use condoms and avoid high-risk sexual practices to reduce the spread of sexually transmitted infections (STIs).  You should be screened for STIs, including gonorrhea and chlamydia if:  You are sexually active and are younger than 24 years.  You are older than 24 years, and your health care provider tells you that you are at risk for this type of infection.  Your sexual activity has changed since you were last screened, and you are at an increased risk for chlamydia or gonorrhea. Ask your health care provider if you are at risk.  If you are at risk of being infected with HIV, it is recommended that you take a prescription medicine daily to prevent HIV infection. This is called pre-exposure prophylaxis (PrEP). You are considered at risk if:  You are a man who has sex with other men (MSM).  You are a heterosexual man who is sexually active with multiple partners.  You take drugs by injection.  You are sexually active with a partner who has HIV.  Talk with your health care provider about whether you are at high risk of being infected with HIV. If you choose to begin PrEP, you should  first be tested for HIV. You should then be tested every 3 months for as long as you are taking PrEP.  Use sunscreen. Apply sunscreen liberally and repeatedly throughout the day. You should seek shade when your shadow is shorter than you. Protect yourself by wearing long sleeves, pants, a wide-brimmed hat, and sunglasses year round whenever you are outdoors.  Tell your health care provider of new moles or changes in moles, especially if there is a change in shape or color. Also, tell your health care provider if a mole is larger than the size of a pencil eraser.  A one-time screening for abdominal aortic aneurysm (AAA) and surgical repair of large AAAs by ultrasound is recommended for men aged 41-75 years who are current or former smokers.  Stay current with your vaccines (immunizations).   This information is not intended to replace advice given to you by your health care provider. Make sure you discuss any questions you have with your health care provider.   Document Released: 12/29/2007 Document Revised: 07/23/2014 Document Reviewed: 11/27/2010 Elsevier Interactive Patient Education [employer].

## 2015-09-22 NOTE — Assessment & Plan Note (Addendum)
Topical testosterone did not improve readings or symptoms and due to cost pt decided to forgo treatment. Continue to monitor - if developing osteoporosis, would recommend treatment, consider IM.

## 2015-09-22 NOTE — Assessment & Plan Note (Signed)
LDL not at goal. Pt reports compliance with lovastatin. Recheck next visit and if persistently elevated will change to more potent statin.

## 2015-09-22 NOTE — Assessment & Plan Note (Addendum)
Advanced directive: doesn't have one set up. Has talked about this with wife.Wife would be HCPOA.Does not want drastic measures. 

## 2015-09-22 NOTE — Assessment & Plan Note (Signed)

## 2015-09-22 NOTE — Assessment & Plan Note (Signed)
Enlarged, more sxs noted. Declines treatment for now.

## 2015-10-03 ENCOUNTER — Encounter: Payer: Self-pay | Admitting: Dietician

## 2015-10-03 ENCOUNTER — Encounter: Payer: Medicare Other | Attending: Family Medicine | Admitting: Dietician

## 2015-10-03 VITALS — BP 137/59 | Ht 69.0 in | Wt 164.5 lb

## 2015-10-03 DIAGNOSIS — E119 Type 2 diabetes mellitus without complications: Secondary | ICD-10-CM | POA: Insufficient documentation

## 2015-10-03 DIAGNOSIS — Z794 Long term (current) use of insulin: Secondary | ICD-10-CM

## 2015-10-03 NOTE — Progress Notes (Signed)
Diabetes Self-Management Education  Visit Type: First/Initial  Appt. Start Time: 1100 Appt. End Time: 1200  10/03/2015  [name], identified by name and date of birth, is a 70 y.o. male with a diagnosis of Diabetes: Type 2.   ASSESSMENT  Blood pressure 137/59, height 5\' 9"  (1.753 m), weight 164 lb 8 oz (74.617 kg). Body mass index is 24.28 kg/(m^2).      Diabetes Self-Management Education - 10/03/15 1235    Visit Information   Visit Type First/Initial   Initial Visit   Diabetes Type Type 2   Health Coping   How would you rate your overall health? Good   Psychosocial Assessment   Patient Belief/Attitude about Diabetes Motivated to manage diabetes   Self-care barriers None   Patient Concerns Glycemic Control  prevent complications   Preferred Learning Style Hands on;Auditory   Learning Readiness Ready   What is the last grade level you completed in school? 12   Complications   Last HgB A1C per patient/outside source 7.6 %  07-2015   How often do you check your blood sugar? --  1x/day   Fasting Blood glucose range (mg/dL) --  160's-some 200's   Have you had a dilated eye exam in the past 12 months? Yes   Have you had a dental exam in the past 12 months? Yes   Are you checking your feet? No   Dietary Intake   Breakfast --  has decreased intake of sweets and some starch vegetables (potatoes & corn)   Snack (morning) --  eats nabs for am snack (eats snack foods 8+x/day)   Lunch --  eats fried foods 2-3x/wk; eats only occasional fruit   Snack (afternoon) --  eats occasional crackers for afternoon snack   Snack (evening) --  eats cheese occasionally for bedtime snack   Beverage(s) --  drinks 4-5 glasses of water daily and 2-3 glasses of unsweetened tea daily   Exercise   Exercise Type ADL's  no regular exercise but active lifetstyle   Patient Education   Previous Diabetes Education No   Disease state  --  discussed pathophysiology of type 2 diabetes and  treatment options   Nutrition management  Role of diet in the treatment of diabetes and the relationship between the three main macronutrients and blood glucose level;Food label reading, portion sizes and measuring food.;Carbohydrate counting   Physical activity and exercise  Role of exercise on diabetes management, blood pressure control and cardiac health.;Helped patient identify appropriate exercises in relation to his/her diabetes, diabetes complications and other health issue.   Medications Taught/reviewed insulin injection, site rotation, insulin storage and needle disposal.;Reviewed patients medication for diabetes, action, purpose, timing of dose and side effects.   Monitoring Purpose and frequency of SMBG.;Identified appropriate SMBG and/or A1C goals.;Yearly dilated eye exam;Taught/discussed recording of test results and interpretation of SMBG.;Taught/evaluated SMBG meter.  reviewed use of Ultra One Touch meter   Acute complications Taught treatment of hypoglycemia - the 15 rule.   Chronic complications Relationship between chronic complications and blood glucose control;Identified and discussed with patient  current chronic complications;Dental care;Retinopathy and reason for yearly dilated eye exams   Psychosocial adjustment Role of stress on diabetes   Personal strategies to promote health Lifestyle issues that need to be addressed for better diabetes care      Individualized Plan for Diabetes Self-Management Training:   Learning Objective:  Patient will have a greater understanding of diabetes self-management. Patient education plan is to attend individual and/or group  sessions per assessed needs and concerns.   Plan:   Patient Instructions   Check blood sugars 2 x day before breakfast and 2 hrs after supper every day  Exercise:  Try walking  for   15  minutes   2-3 days a week (as permitted by MD)  Avoid sugar sweetened drinks (soda, tea, coffee, sports drinks,  juices)  Limit intake of sweets and fried foods  Eat 3 meals day,   2-3  snacks a day  Space meals 4-6 hours apart  Make healthy food choices  Bring blood sugar records to the next appointment/class  Get a Haematologist fast acting glucose and a snack at all times  Rotate injection sites  Return for appointment/classes on:  10-06-15   Expected Outcomes:   positive  Education material provided: [employer] Guidelines, Low BG handout  If problems or questions, patient to contact team via:  [phone]  Future DSME appointment:  10-06-15

## 2015-10-03 NOTE — Patient Instructions (Signed)
  Check blood sugars 2 x day before breakfast and 2 hrs after supper every day  Exercise:  Try walking  for   15  minutes   2-3 days a week (as permitted by MD)  Avoid sugar sweetened drinks (soda, tea, coffee, sports drinks, juices)  Limit intake of sweets and fried foods  Eat 3 meals day,   2-3  snacks a day  Space meals 4-6 hours apart  Make healthy food choices  Bring blood sugar records to the next appointment/class  Get a Haematologist fast acting glucose and a snack at all times  Rotate injection sites  Return for appointment/classes on:  10-06-15

## 2015-10-04 ENCOUNTER — Encounter: Payer: Self-pay | Admitting: Family Medicine

## 2015-10-06 ENCOUNTER — Encounter: Payer: Self-pay | Admitting: Dietician

## 2015-10-06 ENCOUNTER — Encounter: Payer: Medicare Other | Admitting: Dietician

## 2015-10-06 VITALS — Wt 167.4 lb

## 2015-10-06 DIAGNOSIS — E119 Type 2 diabetes mellitus without complications: Secondary | ICD-10-CM

## 2015-10-06 DIAGNOSIS — Z794 Long term (current) use of insulin: Principal | ICD-10-CM

## 2015-10-06 NOTE — Progress Notes (Signed)

## 2015-10-13 ENCOUNTER — Encounter: Payer: Self-pay | Admitting: *Deleted

## 2015-10-13 ENCOUNTER — Encounter: Payer: Medicare Other | Admitting: *Deleted

## 2015-10-13 VITALS — Wt 168.2 lb

## 2015-10-13 DIAGNOSIS — Z794 Long term (current) use of insulin: Principal | ICD-10-CM

## 2015-10-13 DIAGNOSIS — E119 Type 2 diabetes mellitus without complications: Secondary | ICD-10-CM | POA: Diagnosis not present

## 2015-10-13 NOTE — Progress Notes (Signed)

## 2015-10-15 HISTORY — PX: HAND SURGERY: SHX662

## 2015-10-20 ENCOUNTER — Encounter: Payer: Medicare Other | Attending: Family Medicine | Admitting: Dietician

## 2015-10-20 ENCOUNTER — Encounter: Payer: Self-pay | Admitting: Dietician

## 2015-10-20 VITALS — Ht 69.0 in | Wt 168.2 lb

## 2015-10-20 DIAGNOSIS — E119 Type 2 diabetes mellitus without complications: Secondary | ICD-10-CM | POA: Diagnosis present

## 2015-10-20 DIAGNOSIS — Z794 Long term (current) use of insulin: Secondary | ICD-10-CM

## 2015-10-20 NOTE — Progress Notes (Signed)
Appt. Start Time: 9:00 Appt. End Time: 11:30am  Class 3 Psychosocial - identify DM as a source of stress; state the effects of stress on BG control; verbalize appropriate stress management techniques; identify personal stress issues   Nutritional Management - describe effects of food on blood glucose; identify sources of carbohydrate, protein and fat; verbalize the importance of balance meals in controlling blood glucose; identify meals as well balanced or not; estimate servings of carbohydrate from menus; use food labels to identify servings size, content of carbohydrate, fiber, protein, fat, saturated fat and sodium; recognize food sources of fat, saturated fat, trans fat, sodium and verbalize goals for intake; describe healthful appropriate food choices when dining out   Exercise - describe the effects of exercise on blood glucose and importance of regular exercise in controlling diabetes; state a plan for personal exercise; verbalize contraindications for exercise  Self-Monitoring - state importance of HBGM and demo procedure accurately; use HBGM results to effectively manage diabetes; identify importance of regular HbA1C testing and goals for results  Acute Complications/Sick Day Guidelines - recognize hyperglycemia and hypoglycemia with causes and effects; identify blood glucose results as high, low or in control; list steps in treating and preventing high and low blood glucose; state appropriate measure to manage blood glucose when ill (need for meds, HBGM plan, when to call physician, need for fluids)  Chronic Complications/Foot, Skin, Eye Dental Care - identify possible long-term complications of diabetes (retinopathy, neuropathy, nephropathy, cardiovascular disease, infections); explain steps in prevention and treatment of chronic complications; state importance of daily self-foot exams; describe how to examine feet and what to look for; explain appropriate eye and dental care  Lifestyle  Changes/Goals & Health/Community Resources - state benefits of making appropriate lifestyle changes; identify habits that need to change (meals, tobacco, alcohol); identify strategies to reduce risk factors for personal health; set goals for proper diabetes care; state need for and frequency of healthcare follow-up; describe appropriate community resources for good health (ADA, web sites, apps)   Teaching Materials Used: Class 3 Slide Packet Diabetes Stress Test Stress Management Tools Stress Poem Recipe/Menu Booklet Nutrition Prescription Fast Food Information Goal Setting Worksheet

## 2015-10-24 ENCOUNTER — Encounter: Payer: Self-pay | Admitting: Dietician

## 2015-10-31 ENCOUNTER — Other Ambulatory Visit: Payer: Self-pay | Admitting: Dermatology

## 2015-11-07 ENCOUNTER — Other Ambulatory Visit: Payer: Self-pay | Admitting: Family Medicine

## 2015-12-27 ENCOUNTER — Other Ambulatory Visit: Payer: Self-pay | Admitting: *Deleted

## 2015-12-27 MED ORDER — LOVASTATIN 40 MG PO TABS: 40.0000 mg | ORAL_TABLET | Freq: Every day | ORAL | Status: DC

## 2016-01-19 ENCOUNTER — Telehealth: Payer: Self-pay

## 2016-01-19 NOTE — Telephone Encounter (Signed)
Patient is on the list for [facility] 2017 and may be a good candidate for an AWV in 2017. Please let me know if/when appt is scheduled.   

## 2016-01-23 ENCOUNTER — Encounter: Payer: Self-pay | Admitting: Family Medicine

## 2016-01-23 ENCOUNTER — Ambulatory Visit (INDEPENDENT_AMBULATORY_CARE_PROVIDER_SITE_OTHER): Payer: Medicare Other | Admitting: Family Medicine

## 2016-01-23 VITALS — BP 118/68 | HR 87 | Temp 98.0°F | Ht 69.0 in | Wt 167.8 lb

## 2016-01-23 DIAGNOSIS — E785 Hyperlipidemia, unspecified: Secondary | ICD-10-CM | POA: Diagnosis not present

## 2016-01-23 DIAGNOSIS — I2510 Atherosclerotic heart disease of native coronary artery without angina pectoris: Secondary | ICD-10-CM

## 2016-01-23 DIAGNOSIS — E118 Type 2 diabetes mellitus with unspecified complications: Secondary | ICD-10-CM | POA: Diagnosis not present

## 2016-01-23 DIAGNOSIS — Z794 Long term (current) use of insulin: Secondary | ICD-10-CM | POA: Diagnosis not present

## 2016-01-23 DIAGNOSIS — I10 Essential (primary) hypertension: Secondary | ICD-10-CM

## 2016-01-23 LAB — BASIC METABOLIC PANEL
BUN: 18 mg/dL (ref 6–23)
CO2: 29 meq/L (ref 19–32)
Calcium: 9.5 mg/dL (ref 8.4–10.5)
Chloride: 107 mEq/L (ref 96–112)
Creatinine, Ser: 0.77 mg/dL (ref 0.40–1.50)
GFR: 105.84 mL/min (ref >60.00)
GLUCOSE: 143 mg/dL — AB (ref 70–99)
POTASSIUM: 4.5 meq/L (ref 3.5–5.1)
SODIUM: 139 meq/L (ref 135–145)

## 2016-01-23 LAB — LDL CHOLESTEROL, DIRECT: Direct LDL: 65 mg/dL

## 2016-01-23 LAB — HEMOGLOBIN A1C: Hgb A1c MFr Bld: 7.1 % — ABNORMAL HIGH (ref 4.6–6.5)

## 2016-01-23 MED ORDER — SITAGLIP PHOS-METFORMIN HCL ER 100-1000 MG PO TB24: 1.0000 | ORAL_TABLET | Freq: Every day | ORAL | Status: DC

## 2016-01-23 NOTE — Assessment & Plan Note (Signed)
Chronic, stable. Continue current regimen. 

## 2016-01-23 NOTE — Assessment & Plan Note (Signed)
Has now established with Dr Fletcher Anon. Continue aspirin and statin. Known CAD.

## 2016-01-23 NOTE — Assessment & Plan Note (Signed)
Recheck today. LDL goal <70 given personal CAD history. Consider more potent statin if LDL remains elevated today.

## 2016-01-23 NOTE — Assessment & Plan Note (Signed)
Chronic, mildly deteriorated. Completed DSME 09/2015 Continue lantus 40u daily.  Will transition to metformin ER 1000 + sitagliptin combo pill. Sent to pharmacy and pt will price out. RTC 23mo f/u visit.

## 2016-01-23 NOTE — Patient Instructions (Addendum)
Price out combination metformin ER and [place] sent to pharmacy [facility] today.  Return in 4 months for follow up , sooner if needed.

## 2016-01-23 NOTE — Progress Notes (Signed)
BP 118/68 mmHg  Pulse 87  Temp(Src) 98 F (36.7 C) (Oral)  Ht 5\' 9"  (1.753 m)  Wt 167 lb 12 oz (76.091 kg)  BMI 24.76 kg/m2   CC: DM f/u visit  Subjective:    Patient ID: Norman Marks, male    DOB: 06-Feb-1946, 70 y.o.   MRN: [id number]  HPI: Norman Marks is a 70 y.o. male presenting on 01/23/2016 for Follow-up   DM - regularly does check sugars fasting - 150 average. Compliant with antihyperglycemic regimen which includes: lantus 40u daily, metformin 1000mg  once daily (diarrhea limits higher dose), januvia 100mg  daily. One low sugar to 55 at night (hypoglycemic symptoms with this). Denies paresthesias. Last diabetic eye exam 05/2015. Pneumovax: 2011. Prevnar: 2015. Last visit we referred to diabetes education.  Lab Results  Component Value Date   HGBA1C 7.5* 09/15/2015    Diabetic Foot Exam - Simple   No data filed       HTN - Compliant with current antihypertensive regimen of carvedilol 25mg  bid, ramipril 10mg  daily.Does check blood pressures at home: good control endorsed.No low blood pressure readings or symptoms of dizziness/syncope.Denies HA, vision changes, CP/tightness, SOB, leg swelling.  HLD - compliant with lovastatin 40mg  daily. No myalgias. LDL goal <70.   CAD - known 70% blockage - under medical management. On aspirin and statin. Asxs. Followed by Dr [name] -> established with Dr Fletcher Anon.  Relevant past medical, surgical, family and social history reviewed and updated as indicated. Interim medical history since our last visit reviewed. Allergies and medications reviewed and updated. Current Outpatient Prescriptions on File Prior to Visit  Medication Sig  . aspirin 81 MG tablet Take 81 mg by mouth daily.  . B-D INS SYR ULTRAFINE 1CC/31G 31G X 5/16" 1 ML MISC USE TO INJECT INSULIN AS DIRECTED  . carvedilol (COREG) 25 MG tablet Take 1 tablet (25 mg total) by mouth 2 (two) times daily with a meal.  . Cholecalciferol (VITAMIN D) 2000 units CAPS Take 1  capsule by mouth daily.  Marland Kitchen glucose blood (ONE TOUCH ULTRA TEST) test strip Check blood sugar once to twice daily and as directed. Dx E11.8  . insulin glargine (LANTUS) 100 UNIT/ML injection Inject 0.36-0.4 mLs (36-40 Units total) into the skin at bedtime. (Patient taking differently: Inject 40 Units into the skin at bedtime. )  . Insulin Pen Needle (EXEL PEN NEEDLES 31GX1/4") 31G X 6 MM MISC Use as directed  . lovastatin (MEVACOR) 40 MG tablet Take 1 tablet (40 mg total) by mouth at bedtime.  . metFORMIN (GLUCOPHAGE) 1000 MG tablet Take 1 tablet (1,000 mg total) by mouth daily with breakfast. (Patient taking differently: Take 1,000 mg by mouth daily with supper. )  . ramipril (ALTACE) 10 MG capsule Take 1 capsule (10 mg total) by mouth daily.  . sitaGLIPtin (JANUVIA) 100 MG tablet Take 1 tablet (100 mg total) by mouth daily.   No current facility-administered medications on file prior to visit.    Review of Systems Per HPI unless specifically indicated in ROS section     Objective:    BP 118/68 mmHg  Pulse 87  Temp(Src) 98 F (36.7 C) (Oral)  Ht 5\' 9"  (1.753 m)  Wt 167 lb 12 oz (76.091 kg)  BMI 24.76 kg/m2  Wt Readings from Last 3 Encounters:  01/23/16 167 lb 12 oz (76.091 kg)  10/20/15 168 lb 3.2 oz (76.295 kg)  10/13/15 168 lb 3.2 oz (76.295 kg)    Physical Exam  Constitutional:  He appears well-developed and well-nourished. No distress.  HENT:  Head: Normocephalic and atraumatic.  Right Ear: External ear normal.  Left Ear: External ear normal.  Nose: Nose normal.  Mouth/Throat: Oropharynx is clear and moist. No oropharyngeal exudate.  Eyes: Conjunctivae and EOM are normal. Pupils are equal, round, and reactive to light. No scleral icterus.  Neck: Normal range of motion. Neck supple.  Cardiovascular: Normal rate, regular rhythm, normal heart sounds and intact distal pulses.   No murmur heard. Pulmonary/Chest: Effort normal and breath sounds normal. No respiratory distress.  He has no wheezes. He has no rales.  Musculoskeletal: He exhibits no edema.  See HPI for foot exam if done Brace on left hand after recent [facility] surgery  Lymphadenopathy:    He has no cervical adenopathy.  Skin: Skin is warm and dry. No rash noted.  Psychiatric: He has a normal mood and affect.  Nursing note and vitals reviewed.  Results for orders placed or performed in visit on 09/15/15  Hemoglobin A1c  Result Value Ref Range   Hgb A1c MFr Bld 7.5 (H) 4.6 - 6.5 %  Microalbumin / creatinine urine ratio  Result Value Ref Range   Microalb, Ur 1.6 0.0 - 1.9 mg/dL   Creatinine,U 169.1 mg/dL   Microalb Creat Ratio 0.9 0.0 - 30.0 mg/g      Assessment & Plan:  * requests labs sent through mail. Problem List Items Addressed This Visit    Diabetes mellitus type 2, controlled, with complications ([place]) - Primary    Chronic, mildly deteriorated. Completed DSME 09/2015 Continue lantus 40u daily.  Will transition to metformin ER 1000 + sitagliptin combo pill. Sent to pharmacy and pt will price out. RTC 29mo f/u visit.       Relevant Medications   SitaGLIPtin-MetFORMIN HCl [phone] MG TB24   Other Relevant Orders   Hemoglobin [id number]   Basic metabolic panel   HTN (hypertension)    Chronic, stable. Continue current regimen.       HLD (hyperlipidemia)    Recheck today. LDL goal <70 given personal CAD history. Consider more potent statin if LDL remains elevated today.       Relevant Orders   LDL Cholesterol, Direct   CAD (coronary artery disease)    Has now established with Dr Fletcher Anon. Continue aspirin and statin. Known CAD.          Follow up plan: Return in about 4 months (around 05/25/2016) for follow up visit.  Ria Bush, MD

## 2016-01-23 NOTE — Progress Notes (Signed)
Pre visit review using our clinic review tool, if applicable. No additional management support is needed unless otherwise documented below in the visit note. 

## 2016-01-30 ENCOUNTER — Encounter: Payer: Self-pay | Admitting: *Deleted

## 2016-02-02 ENCOUNTER — Ambulatory Visit (INDEPENDENT_AMBULATORY_CARE_PROVIDER_SITE_OTHER): Payer: Medicare Other | Admitting: Cardiovascular Disease

## 2016-02-02 ENCOUNTER — Encounter: Payer: Self-pay | Admitting: Cardiovascular Disease

## 2016-02-02 VITALS — BP 130/70 | HR 83 | Ht 69.0 in | Wt 168.8 lb

## 2016-02-02 DIAGNOSIS — E785 Hyperlipidemia, unspecified: Secondary | ICD-10-CM

## 2016-02-02 DIAGNOSIS — I10 Essential (primary) hypertension: Secondary | ICD-10-CM

## 2016-02-02 DIAGNOSIS — I447 Left bundle-branch block, unspecified: Secondary | ICD-10-CM

## 2016-02-02 DIAGNOSIS — I2510 Atherosclerotic heart disease of native coronary artery without angina pectoris: Secondary | ICD-10-CM | POA: Diagnosis not present

## 2016-02-02 NOTE — Patient Instructions (Signed)
Medication Instructions: Continue same medications.   Labwork: None.   Procedures/Testing: None.   Follow-Up: 1 year with Dr. Laylani Pudwill.   Any Additional Special Instructions Will Be Listed Below (If Applicable).     If you need a refill on your cardiac medications before your next appointment, please call your pharmacy.   

## 2016-02-02 NOTE — Progress Notes (Signed)
Cardiology Office Note   Date:  02/02/2016   ID:  Norman Marks, DOB 07/12/1946, MRN [id number]  PCP:  Ria Bush, MD  Cardiologist:   Kathlyn Sacramento, MD   Chief Complaint  Patient presents with  . Coronary Artery Disease    no sx      History of Present Illness: Norman Marks is a 70 y.o. male who presents for a follow-up visit regarding coronary artery disease and left bundle branch block.  He had cardiac catheterization in 2009 which showed one-vessel coronary artery disease involving the proximal ramus (70% stenosis). He is being treated medically. Most recent nuclear stress test in June 2015 showed a fixed anteroseptal and inferoseptal defects with normal ejection fraction. The defects were felt to be due to left bundle branch block. He has known history of hypertension, diabetes and hyperlipidemia.  During last visit, I increased the dose of carvedilol to 25 mg twice daily due to uncontrolled blood pressure. Blood pressure has improved significantly since then. He had an echocardiogram done in January 2017 which showed an ejection fraction of [id number], grade 1 diastolic dysfunction, mildly dilated left atrium, mild mitral regurgitation and mild pulmonary hypertension. He has been doing very well and denies any chest pain, shortness of breath or palpitations.   Past Medical History  Diagnosis Date  . History of chicken pox   . Diabetes type 2, uncontrolled ([place]) 1990s    referred to [facility] 09/2015  . Hx of migraines   . BPH (benign prostatic hypertrophy)   . HTN (hypertension)     mild  . HLD (hyperlipidemia)   . CAD (coronary artery disease) 2009    [name] (cardiolite 2009 with 70% stenosis of proximal ramus, not amenable to PCI, rec treat medically) --> [name]  . Osteopenia 06/2011    T score -3, completed 2 yrs forteo, T score improved to -2.2 (2015)  . History of nephrolithiasis 2012    L ureter, R renal pelvis  . DDD (degenerative disc disease), lumbar     s/p surgery  . Vitamin D deficiency   . History of bundle branch block   . History of adenomatous polyp of colon 10/2012  . Osteoarthritis of [place] joint of thumb 2014    s/p injections ([place])  . Osteoarthritis of right shoulder 2015    s/p injections ([place])  . PONV (postoperative nausea and vomiting)   . Heart murmur   . Dysrhythmia   . Headache(784.0)   . Cancer ([place])     basal cell skin cancer    Past Surgical History  Procedure Laterality Date  . Back surgery  1994, 2008    lumbar; x 3, spinal fusion  . Knee surgery Right   . Shoulder surgery Right   . US echocardiography  10/2008    nl LV fxn, EF 50%, mild MR, mild pulm HTN  . Skin surgery  2006    skin cancer  . Cardiovascular stress test  2011    fixed defect inferior/septal walls without reversibility, LVEF 43% post stress  . Dexa  06/2011    osteoporosis T -2.6 spine, -1.9 hip  . Flexible sigmoidoscopy  2007    pt reported normal ([place])  . Colonoscopy  10/2012    tubular adenoma, mild diverticulosis, rpt 5 yrs [place])  . Dexa  07/2013    osteopenia T -2.2 femur, -1.2 spine  . Cardiac catheterization  2009    70% stenosis ostium  . 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 Hand surgery Left 10/2015    [facility] arthritis repair [place])     Current Outpatient Prescriptions  Medication Sig Dispense Refill  . aspirin 81 MG tablet Take 81 mg by mouth daily.    . B-D INS SYR ULTRAFINE 1CC/31G 31G X 5/16" 1 ML MISC USE TO INJECT INSULIN AS DIRECTED 90 each 2  . carvedilol (COREG) 25 MG tablet Take 1 tablet (25 mg total) by mouth 2 (two) times daily with a meal. 60 tablet 6  . Cholecalciferol (VITAMIN D) 2000 units CAPS Take 1 capsule by mouth daily.    Marland Kitchen glucose blood (ONE TOUCH ULTRA TEST) test strip Check blood sugar once to twice daily and as directed. Dx E11.8 100 each 5    . insulin glargine (LANTUS) 100 UNIT/ML injection Inject 0.36-0.4 mLs (36-40 Units total) into the skin at bedtime. (Patient taking differently: Inject 40 Units into the skin at bedtime. ) 10 mL 11  . Insulin Pen Needle (EXEL PEN NEEDLES 31GX1/4") 31G X 6 MM MISC Use as directed 100 each 3  . lovastatin (MEVACOR) 40 MG tablet Take 1 tablet (40 mg total) by mouth at bedtime. 90 tablet 2  . ramipril (ALTACE) 10 MG capsule Take 1 capsule (10 mg total) by mouth daily. 30 capsule 11  . SitaGLIPtin-MetFORMIN HCl [phone] MG TB24 Take 1 tablet by mouth daily. 30 tablet 3   No current facility-administered medications for this visit.    Allergies:   Metformin and related and Percocet    Social History:  The patient  reports that he quit smoking about 23 years ago. His smoking use included Cigarettes. He has a 2.5 pack-year smoking history. He quit smokeless tobacco use about 23 years ago. His smokeless tobacco use included Chew. He reports that he does not drink alcohol or use illicit drugs.   Family History:  The patient's family history includes Cancer in his father and mother; Coronary artery disease (age of onset: 64) in his sister; Diabetes in his brother and father; Heart attack (age of onset: 33) in his sister; Heart disease in his mother. There is no history of Stroke or Colon cancer.    ROS:  Please see the history of present illness.   Otherwise, review of systems are positive for none.   All other systems are reviewed and negative.    PHYSICAL EXAM: VS:  BP 130/70 mmHg  Pulse 83  Ht 5\' 9"  (1.753 m)  Wt 168 lb 12.8 oz (76.567 kg)  BMI 24.92 kg/m2  SpO2 97% , BMI Body mass index is 24.92 kg/(m^2). GEN: Well nourished, well developed, in no acute distress HEENT: normal Neck: no JVD, carotid bruits, or masses Cardiac: RRR; no murmurs, rubs, or gallops,no edema  Respiratory:  clear to auscultation bilaterally, normal work of breathing GI: soft, nontender, nondistended, + BS MS: no  deformity or atrophy Skin: warm and dry, no rash Neuro:  Strength and sensation are intact Psych: euthymic mood, full affect   EKG:  EKG is ordered today. The ekg ordered today demonstrates normal sinus rhythm with left bundle branch block.   Recent Labs: 09/15/2015: ALT 15; TSH 0.85 01/23/2016: BUN 18; Creatinine, Ser 0.77; Potassium 4.5; Sodium 139    Lipid Panel    Component Value Date/Time   CHOL 104 03/16/2015 0801   TRIG 74.0 03/16/2015 0801   HDL 41.50 03/16/2015 0801  CHOLHDL 3 03/16/2015 0801   VLDL 14.8 03/16/2015 0801   LDLCALC 48 03/16/2015 0801   LDLDIRECT 65.0 01/23/2016 0931      Wt Readings from Last 3 Encounters:  02/02/16 168 lb 12.8 oz (76.567 kg)  01/23/16 167 lb 12 oz (76.091 kg)  10/20/15 168 lb 3.2 oz (76.295 kg)        ASSESSMENT AND PLAN:  1.  Coronary artery disease involving native coronary arteries without angina: He is doing very well overall with no anginal symptoms. Continue medical therapy. Most recent nuclear stress test in 2015 showed no evidence of ischemia.  2. Left bundle branch block:  most recent echocardiogram this year showed an EF of 50-55%. No symptoms of heart failure or arrhythmia.  3. Essential hypertension: Blood pressure is now well controlled after increasing the dose of carvedilol.  4. Hyperlipidemia: I reviewed his most recent lipid profile with him which showed an LDL of 65. This is at target. Continue treatment with lovastatin. Other labs were reviewed included hemoglobin A1c which was 7.1.  Disposition:   FU with me in 1 year  Signed,  Kathlyn Sacramento, MD  02/02/2016 10:26 AM    [place]

## 2016-02-15 ENCOUNTER — Encounter: Payer: Self-pay | Admitting: *Deleted

## 2016-02-15 ENCOUNTER — Emergency Department
Admission: EM | Admit: 2016-02-15 | Discharge: 2016-02-15 | Disposition: A | Discharge status: Home / Self Care | Payer: Medicare Other | Attending: Student in an Organized Health Care Education/Training Program | Admitting: Student in an Organized Health Care Education/Training Program

## 2016-02-15 DIAGNOSIS — Y939 Activity, unspecified: Secondary | ICD-10-CM | POA: Diagnosis not present

## 2016-02-15 DIAGNOSIS — Y999 Unspecified external cause status: Secondary | ICD-10-CM | POA: Insufficient documentation

## 2016-02-15 DIAGNOSIS — Y929 Unspecified place or not applicable: Secondary | ICD-10-CM | POA: Diagnosis not present

## 2016-02-15 DIAGNOSIS — Z85828 Personal history of other malignant neoplasm of skin: Secondary | ICD-10-CM | POA: Diagnosis not present

## 2016-02-15 DIAGNOSIS — Z87891 Personal history of nicotine dependence: Secondary | ICD-10-CM | POA: Diagnosis not present

## 2016-02-15 DIAGNOSIS — S61213A Laceration without foreign body of left middle finger without damage to nail, initial encounter: Secondary | ICD-10-CM | POA: Diagnosis present

## 2016-02-15 DIAGNOSIS — W270XXA Contact with workbench tool, initial encounter: Secondary | ICD-10-CM | POA: Insufficient documentation

## 2016-02-15 DIAGNOSIS — E119 Type 2 diabetes mellitus without complications: Secondary | ICD-10-CM | POA: Diagnosis not present

## 2016-02-15 DIAGNOSIS — I10 Essential (primary) hypertension: Secondary | ICD-10-CM | POA: Diagnosis not present

## 2016-02-15 DIAGNOSIS — Z794 Long term (current) use of insulin: Secondary | ICD-10-CM | POA: Insufficient documentation

## 2016-02-15 DIAGNOSIS — I2510 Atherosclerotic heart disease of native coronary artery without angina pectoris: Secondary | ICD-10-CM | POA: Insufficient documentation

## 2016-02-15 DIAGNOSIS — S61219A Laceration without foreign body of unspecified finger without damage to nail, initial encounter: Secondary | ICD-10-CM

## 2016-02-15 DIAGNOSIS — Z7982 Long term (current) use of aspirin: Secondary | ICD-10-CM | POA: Insufficient documentation

## 2016-02-15 MED ORDER — LIDOCAINE HCL (PF) 1 % IJ SOLN
5.0000 mL | Freq: Once | INTRAMUSCULAR | Status: AC
  Administered 2016-02-15: 5 mL

## 2016-02-15 MED ORDER — LIDOCAINE HCL (PF) 1 % IJ SOLN
INTRAMUSCULAR | Status: AC
  Filled 2016-02-15: qty 5

## 2016-02-15 MED ORDER — CEPHALEXIN 500 MG PO CAPS: 500.0000 mg | ORAL_CAPSULE | Freq: Three times a day (TID) | ORAL | 0 refills | Status: DC

## 2016-02-15 NOTE — ED Triage Notes (Addendum)
Pt arrives with laceration to left middle finger, pt was using hacksaw, last tetnus in the last 5 years, finger wrapped upon arrival, bleeding controlled

## 2016-02-15 NOTE — ED Notes (Signed)
Non stick dressing placed to pt left middle finger.

## 2016-02-15 NOTE — ED Notes (Signed)
Pt in via triage; pt reports cutting left middle finger with a hacksaw prior to arrival.  Pt with approximately one inch laceration to top of finger, bleeding controlled at this time.  Pt in no immediate distress.

## 2016-02-15 NOTE — ED Provider Notes (Signed)
[facility] Emergency Department Provider Note  ____________________________________________  Time seen: Approximately 12:56 PM  I have reviewed the triage vital signs and the nursing notes.   HISTORY  Chief Complaint Laceration    HPI Norman Marks is a 70 y.o. male, NAD, presents in the emergency department with laceration to the left middle finger. Patient states that he was using a hack saw on a plastic pipe when he was momentarily distracted and subsequently cut his left middle finger. Patient was able to control the bleeding with pressure. Patient denies decreased ROM or sensation to the digit. Last tetanus shot was within the last 5 years. Patient is a diabetic and takes daily aspirin. Pain is currently 5/10.   Past Medical History:  Diagnosis Date  . BPH (benign prostatic hypertrophy)   . CAD (coronary artery disease) 2009   [name] (cardiolite 2009 with 70% stenosis of proximal ramus, not amenable to PCI, rec treat medically) --> [name]  . Cancer ([place])    basal cell skin cancer  . DDD (degenerative disc disease), lumbar    s/p surgery  . Diabetes type 2, uncontrolled ([place]) 1990s   referred to [facility] 09/2015  . Dysrhythmia   . Headache(784.0)   . Heart murmur   . History of adenomatous polyp of colon 10/2012  . History of bundle branch block   . History of chicken pox   . History of nephrolithiasis 2012   L ureter, R renal pelvis  . HLD (hyperlipidemia)   . HTN (hypertension)    mild  . Hx of migraines   . Osteoarthritis of [place] joint of thumb 2014   s/p injections ([place])  . Osteoarthritis of right shoulder 2015   s/p injections ([place])  . Osteopenia 06/2011   T score -3, completed 2 yrs forteo, T score improved to -2.2 (2015)  . PONV (postoperative nausea and vomiting)   . Vitamin D deficiency     Patient Active Problem List   Diagnosis Date Noted  . Advanced care planning/counseling discussion 09/20/2014  . Health maintenance  examination 09/20/2014  . Left bundle branch block 07/26/2014  . Osteoarthritis 01/26/2014  . Primary localized osteoarthrosis of shoulder region 01/26/2014  . Skin lump of leg 09/16/2013  . Transaminitis 05/18/2013  . Medicare annual wellness visit, subsequent 09/10/2012  . Hypotestosteronism 05/09/2012  . Vitamin D deficiency 02/02/2012  . Diabetes mellitus type 2, controlled, with complications ([place])   . Hx of migraines   . BPH with obstruction/lower urinary tract symptoms   . HTN (hypertension)   . HLD (hyperlipidemia)   . CAD (coronary artery disease)   . Osteopenia     Past Surgical History:  Procedure Laterality Date  . [place], 2008   lumbar; x 3, spinal fusion  . CARDIAC CATHETERIZATION  2009   70% stenosis ostium  . CARDIOVASCULAR STRESS TEST  2011   fixed defect inferior/septal walls without reversibility, LVEF 43% post stress  . COLONOSCOPY  10/2012   tubular adenoma, mild diverticulosis, rpt 5 yrs [place])  . dexa  06/2011   osteoporosis T -2.6 spine, -1.9 hip  . dexa  07/2013   osteopenia T -2.2 femur, -1.2 spine  . FLEXIBLE SIGMOIDOSCOPY  2007   pt reported normal ([place])  . HAND SURGERY Left 10/2015   [place] arthritis repair [place])  . KNEE SURGERY Right   . SHOULDER SURGERY Right   . SKIN SURGERY  2006   skin cancer  . TOTAL SHOULDER ARTHROPLASTY Right  01/26/2014   Procedure: TOTAL SHOULDER ARTHROPLASTY;  Surgeon: Johnny Bridge, MD;  Location: [facility];  Service: Orthopedics;  Laterality: Right;  . US ECHOCARDIOGRAPHY  10/2008   nl LV fxn, EF 50%, mild MR, mild pulm HTN    Prior to Admission medications   Medication Sig Start Date End Date Taking? Authorizing Provider  aspirin 81 MG tablet Take 81 mg by mouth daily.    Historical Provider, MD  B-D INS SYR ULTRAFINE 1CC/31G 31G X 5/16" 1 ML MISC USE TO INJECT INSULIN AS DIRECTED 11/07/15   Ria Bush, MD  carvedilol (COREG) 25 MG tablet Take 1 tablet (25 mg total) by mouth 2 (two) times  daily with a meal. 08/03/15   Wellington Hampshire, MD  cephALEXin (KEFLEX) 500 MG capsule Take 1 capsule (500 mg total) by mouth 3 (three) times daily. 02/15/16   Darlena Koval L Floyd Lusignan, PA-C  Cholecalciferol (VITAMIN D) 2000 units CAPS Take 1 capsule by mouth daily.    Historical Provider, MD  glucose blood (ONE TOUCH ULTRA TEST) test strip Check blood sugar once to twice daily and as directed. Dx E11.8 07/27/15   Ria Bush, MD  insulin glargine (LANTUS) 100 UNIT/ML injection Inject 0.36-0.4 mLs (36-40 Units total) into the skin at bedtime. Patient taking differently: Inject 40 Units into the skin at bedtime.  09/22/15   Ria Bush, MD  Insulin Pen Needle (EXEL PEN NEEDLES 31GX1/4") 31G X 6 MM MISC Use as directed 02/08/12   Ria Bush, MD  lovastatin (MEVACOR) 40 MG tablet Take 1 tablet (40 mg total) by mouth at bedtime. 12/27/15   Ria Bush, MD  ramipril (ALTACE) 10 MG capsule Take 1 capsule (10 mg total) by mouth daily. 09/22/15   Ria Bush, MD  SitaGLIPtin-MetFORMIN HCl [phone] MG TB24 Take 1 tablet by mouth daily. 01/23/16   Ria Bush, MD    Allergies Metformin and related and Percocet [oxycodone-acetaminophen]  Family History  Problem Relation Age of Onset  . Cancer Father     lung and prostate  . Diabetes Father   . Heart disease Mother   . Cancer Mother     unsure  . Coronary artery disease Sister 73  . Heart attack Sister 77  . Diabetes Brother   . Stroke Neg Hx   . Colon cancer Neg Hx     Social History Social History  Substance Use Topics  . Smoking status: Former Smoker    Packs/day: 0.25    Years: 10.00    Types: Cigarettes    Quit date: 10/20/1992  . Smokeless tobacco: Former Systems developer    Types: Chew    Quit date: 10/20/1992  . Alcohol use No     Review of Systems  Constitutional: No fever/chills Musculoskeletal: Pain about the left middle finger at site of laceration. No bony or joint tenderness about the left middle finger or hand. Skin:  Negative for rash. Laceration to Left middle finger. Neurological: Negative for headaches, focal weakness or numbness. No tingling. 10-point ROS otherwise negative.  ____________________________________________   PHYSICAL EXAM:  VITAL SIGNS: ED Triage Vitals  Enc Vitals Group     BP 02/15/16 1242 131/69     Pulse Rate 02/15/16 1242 88     Resp 02/15/16 1242 16     Temp 02/15/16 1242 98 F (36.7 C)     Temp Source 02/15/16 1242 Oral     SpO2 02/15/16 1242 97 %     Weight 02/15/16 1242 168 lb (76.2 kg)  Height 02/15/16 1242 5\' 9"  (1.753 m)     Head Circumference --      Peak Flow --      Pain Score 02/15/16 1238 5     Pain Loc --      Pain Edu? --      Excl. in [place]? --      Constitutional: Alert and oriented. Well appearing and in no acute distress. Eyes: Conjunctivae are normal without injection or icterus.  Head: Atraumatic. Cardiovascular: Good peripheral circulation with 2+ pulses noted in bilateral upper extremities. Capillary refill is brisk in all digits of left hand. Respiratory: Normal respiratory effort without tachypnea or retractions.  Musculoskeletal: Patient is able to move finger without deficit in range of motion. Strength of left middle finger is 5 out of 5. Neurologic:  Normal speech and language. No gross focal neurologic deficits are appreciated. Sensation to light touch grossly intact about the digits of the left hand. Skin:  Skin is warm and dry. No rash noted. 1.5 cm laceration to the distal aspect of the left middle finger without visualized or palpated evidence of foreign body. Mild swelling. No bruising noted. Bleeding is controlled. Psychiatric: Mood and affect are normal. Speech and behavior are normal. Patient exhibits appropriate insight and judgement.   ____________________________________________    LABS  None ____________________________________________  EKG  None ____________________________________________  RADIOLOGY  None ____________________________________________    PROCEDURES  Procedure(s) performed: None   .Marland KitchenLaceration Repair Date/Time: 02/15/2016 1:19 PM Performed by: Natale Milch L Authorized by: Braxton Feathers   Consent:    Consent obtained:  Verbal   Consent given by:  Patient   Risks discussed:  Infection, pain, poor wound healing and nerve damage   Alternatives discussed:  No treatment Anesthesia (see MAR for exact dosages):    Anesthesia method:  Local infiltration   Local anesthetic:  Lidocaine 1% w/o epi Laceration details:    Location:  Finger   Finger location:  L long finger   Length (cm):  1.5   Depth (mm):  2 Repair type:    Repair type:  Intermediate Pre-procedure details:    Preparation:  Patient was prepped and draped in usual sterile fashion Exploration:    Hemostasis achieved with:  Direct pressure   Wound exploration: wound explored through full range of motion and entire depth of wound probed and visualized     Wound extent: no foreign bodies/material noted, no muscle damage noted, no nerve damage noted and no tendon damage noted     Contaminated: no   Treatment:    Area cleansed with:  Betadine   Amount of cleaning:  Standard   Irrigation solution:  Sterile saline   Irrigation method:  Syringe Skin repair:    Repair method:  Sutures   Suture size:  4-0   Suture material:  Nylon   Suture technique:  Simple interrupted   Number of sutures:  3 Approximation:    Approximation:  Close   [place] border: well-aligned   Post-procedure details:    Dressing:  Non-adherent dressing   Patient tolerance of procedure:  Tolerated well, no immediate complications     Medications  lidocaine (PF) (XYLOCAINE) 1 % injection 5 mL (5 mLs Infiltration Given by Other 02/15/16 1300)      ____________________________________________   INITIAL IMPRESSION / ASSESSMENT AND PLAN / ED COURSE  Pertinent labs & imaging results that were available during my care of the patient were reviewed by me and considered in my medical decision  making (see chart for details).  Clinical Course    Patient's diagnosis is consistent with laceration of left middle finger. Patient will be discharged home with prescriptions for Keflex to take as directed. Patient is advised to keep the laceration clean and dry for the next 48 hours then may wash with warm soapy water per usual routines. May change dressings once daily as needed. Patient is to follow up with his primary care provider in one week for suture removal.  Patient is given ED precautions to return to the ED for any worsening or new symptoms.    ____________________________________________  FINAL CLINICAL IMPRESSION(S) / ED DIAGNOSES  Final diagnoses:  Laceration of finger, initial encounter      NEW MEDICATIONS STARTED DURING THIS VISIT:  Discharge Medication List as of 02/15/2016  1:24 PM    START taking these medications   Details  cephALEXin (KEFLEX) 500 MG capsule Take 1 capsule (500 mg total) by mouth 3 (three) times daily., Starting Wed 02/15/2016, [place], PA-C 02/15/16 1421    Merlyn Lot, MD 02/15/16 [phone]

## 2016-02-24 ENCOUNTER — Encounter: Payer: Self-pay | Admitting: Family Medicine

## 2016-02-24 ENCOUNTER — Ambulatory Visit (INDEPENDENT_AMBULATORY_CARE_PROVIDER_SITE_OTHER): Payer: Medicare Other | Admitting: Family Medicine

## 2016-02-24 DIAGNOSIS — S61219D Laceration without foreign body of unspecified finger without damage to nail, subsequent encounter: Secondary | ICD-10-CM

## 2016-02-24 DIAGNOSIS — S61219A Laceration without foreign body of unspecified finger without damage to nail, initial encounter: Secondary | ICD-10-CM | POA: Insufficient documentation

## 2016-02-24 NOTE — Patient Instructions (Signed)
Try to find records of last tetanus shot at [facility].  Steri strips should fall off over time. Then keep finger clean and dry - pat dry until fully healed.

## 2016-02-24 NOTE — Assessment & Plan Note (Signed)
Area cleaned with alcohol, then 3 sutures easily removed.  Steri strips applied to distal digit. After care instructions discussed.

## 2016-02-24 NOTE — Progress Notes (Signed)
Pre visit review using our clinic review tool, if applicable. No additional management support is needed unless otherwise documented below in the visit note. 

## 2016-02-24 NOTE — Progress Notes (Signed)
   BP 128/74   Pulse 92   Temp 97.8 F (36.6 C) (Oral)   Wt 165 lb 4 oz (75 kg)   BMI 24.40 kg/m    CC: f/u ER visit Subjective:    Patient ID: Norman Marks, male    DOB: [birth year], 70 y.o.   MRN: [id number]  HPI: Norman Marks is a 70 y.o. male presenting on 02/24/2016 for Follow-up (ER; remove stitches)   ER note reviewed. Suffered laceration of tip of L middle finger while using hacksaw 02/15/2016. 3 stitches placed at [facility] ER. UTD tetanus shot (Td 2011). Pt thinks he had another tetanus shot 2014 at [facility] ER.   Placed on keflex, has completed course.   No fever, erythema or other concerns.  Relevant past medical, surgical, family and social history reviewed and updated as indicated. Interim medical history since our last visit reviewed. Allergies and medications reviewed and updated. Current Outpatient Prescriptions on File Prior to Visit  Medication Sig  . aspirin 81 MG tablet Take 81 mg by mouth daily.  . B-D INS SYR ULTRAFINE 1CC/31G 31G X 5/16" 1 ML MISC USE TO INJECT INSULIN AS DIRECTED  . carvedilol (COREG) 25 MG tablet Take 1 tablet (25 mg total) by mouth 2 (two) times daily with a meal.  . Cholecalciferol (VITAMIN D) 2000 units CAPS Take 1 capsule by mouth daily.  Marland Kitchen glucose blood (ONE TOUCH ULTRA TEST) test strip Check blood sugar once to twice daily and as directed. Dx E11.8  . insulin glargine (LANTUS) 100 UNIT/ML injection Inject 0.36-0.4 mLs (36-40 Units total) into the skin at bedtime. (Patient taking differently: Inject 40 Units into the skin at bedtime. )  . Insulin Pen Needle (EXEL PEN NEEDLES 31GX1/4") 31G X 6 MM MISC Use as directed  . lovastatin (MEVACOR) 40 MG tablet Take 1 tablet (40 mg total) by mouth at bedtime.  . ramipril (ALTACE) 10 MG capsule Take 1 capsule (10 mg total) by mouth daily.  . SitaGLIPtin-MetFORMIN HCl [phone] MG TB24 Take 1 tablet by mouth daily.   No current facility-administered medications on file prior to visit.     Review of  Systems Per HPI unless specifically indicated in ROS section     Objective:    BP 128/74   Pulse 92   Temp 97.8 F (36.6 C) (Oral)   Wt 165 lb 4 oz (75 kg)   BMI 24.40 kg/m   Wt Readings from Last 3 Encounters:  02/24/16 165 lb 4 oz (75 kg)  02/15/16 168 lb (76.2 kg)  02/02/16 168 lb 12.8 oz (76.6 kg)    Physical Exam  Constitutional: He appears well-developed and well-nourished. No distress.  Musculoskeletal: He exhibits no edema.  Skin:  Pulp of L middle finger with skin well approximated with 3 sutures.   Nursing note and vitals reviewed.     Assessment & Plan:   Problem List Items Addressed This Visit    Finger laceration    Area cleaned with alcohol, then 3 sutures easily removed.  Steri strips applied to distal digit. After care instructions discussed.       Other Visit Diagnoses   None.      Follow up plan: No Follow-up on file.  Ria Bush, MD

## 2016-03-12 ENCOUNTER — Other Ambulatory Visit: Payer: Self-pay | Admitting: *Deleted

## 2016-03-12 MED ORDER — CARVEDILOL 25 MG PO TABS: 25.0000 mg | ORAL_TABLET | Freq: Two times a day (BID) | ORAL | 3 refills | Status: DC

## 2016-04-04 ENCOUNTER — Other Ambulatory Visit: Payer: Self-pay | Admitting: Dermatology

## 2016-04-23 ENCOUNTER — Ambulatory Visit (INDEPENDENT_AMBULATORY_CARE_PROVIDER_SITE_OTHER): Payer: Medicare Other

## 2016-04-23 DIAGNOSIS — Z23 Encounter for immunization: Secondary | ICD-10-CM | POA: Diagnosis not present

## 2016-05-21 ENCOUNTER — Other Ambulatory Visit: Payer: Self-pay | Admitting: Family Medicine

## 2016-05-21 DIAGNOSIS — E118 Type 2 diabetes mellitus with unspecified complications: Secondary | ICD-10-CM

## 2016-05-21 DIAGNOSIS — Z794 Long term (current) use of insulin: Principal | ICD-10-CM

## 2016-05-25 ENCOUNTER — Ambulatory Visit (INDEPENDENT_AMBULATORY_CARE_PROVIDER_SITE_OTHER): Payer: Medicare Other | Admitting: Family Medicine

## 2016-05-25 ENCOUNTER — Encounter: Payer: Self-pay | Admitting: Family Medicine

## 2016-05-25 VITALS — BP 130/78 | HR 84 | Temp 97.7°F | Wt 168.0 lb

## 2016-05-25 DIAGNOSIS — I10 Essential (primary) hypertension: Secondary | ICD-10-CM

## 2016-05-25 DIAGNOSIS — E118 Type 2 diabetes mellitus with unspecified complications: Secondary | ICD-10-CM

## 2016-05-25 DIAGNOSIS — E785 Hyperlipidemia, unspecified: Secondary | ICD-10-CM

## 2016-05-25 DIAGNOSIS — Z794 Long term (current) use of insulin: Secondary | ICD-10-CM

## 2016-05-25 LAB — HEMOGLOBIN A1C: Hgb A1c MFr Bld: 7.1 % — ABNORMAL HIGH (ref 4.6–6.5)

## 2016-05-25 NOTE — Progress Notes (Signed)
BP 130/78   Pulse 84   Temp 97.7 F (36.5 C) (Oral)   Wt 168 lb (76.2 kg)   BMI 24.81 kg/m    CC: 4 mo f/u visit Subjective:    Patient ID: Norman Marks, male    DOB: [birth year], 70 y.o.   MRN: [id number]  HPI: Norman Marks is a 70 y.o. male presenting on 05/25/2016 for Follow-up   DM - regularly does check sugars fasting 80-190s. Compliant with antihyperglycemic regimen which includes: lantus 40u daily, janumet 100/1000mg  daily. Denies low sugars or hypoglycemic symptoms. Denies paresthesias. Last diabetic eye exam 05/2015. Pneumovax: 2011.  Prevnar: 2015. Completed diabetes education 09/2015 Lab Results  Component Value Date   HGBA1C 7.1 (H) 01/23/2016   Diabetic Foot Exam - Simple   Simple Foot Form Diabetic Foot exam was performed with the following findings:  Yes 05/25/2016  8:17 AM  Visual Inspection No deformities, no ulcerations, no other skin breakdown bilaterally:  Yes Sensation Testing Intact to touch and monofilament testing bilaterally:  Yes Pulse Check Posterior Tibialis and Dorsalis pulse intact bilaterally:  Yes Comments      HTN - Compliant with current antihypertensive regimen of carvedilol 25mg  bid, ramipril 10mg  daily.Does check blood pressures at home: good control endorsed.No low blood pressure readings or symptoms of dizziness/syncope.Denies HA, vision changes, CP/tightness, SOB, leg swelling.  HLD - compliant with lovastatin 40mg  daily. No myalgias. LDL goal <70.   CAD - known 70% blockage - under medical management. On aspirin and statin. Asxs. Followed by Dr [name] -> established with Dr Fletcher Anon.   Relevant past medical, surgical, family and social history reviewed and updated as indicated. Interim medical history since our last visit reviewed. Allergies and medications reviewed and updated. Current Outpatient Prescriptions on File Prior to Visit  Medication Sig  . aspirin 81 MG tablet Take 81 mg by mouth daily.  . B-D INS SYR  ULTRAFINE 1CC/31G 31G X 5/16" 1 ML MISC USE TO INJECT INSULIN AS DIRECTED  . carvedilol (COREG) 25 MG tablet Take 1 tablet (25 mg total) by mouth 2 (two) times daily with a meal.  . Cholecalciferol (VITAMIN D) 2000 units CAPS Take 1 capsule by mouth daily.  Marland Kitchen glucose blood (ONE TOUCH ULTRA TEST) test strip Check blood sugar once to twice daily and as directed. Dx E11.8  . Insulin Pen Needle (EXEL PEN NEEDLES 31GX1/4") 31G X 6 MM MISC Use as directed  . JANUMET XR [phone] MG TB24 TAKE 1 TABLET BY MOUTH ONCE A DAY  . lovastatin (MEVACOR) 40 MG tablet Take 1 tablet (40 mg total) by mouth at bedtime.  . ramipril (ALTACE) 10 MG capsule Take 1 capsule (10 mg total) by mouth daily.   No current facility-administered medications on file prior to visit.     Review of Systems Per HPI unless specifically indicated in ROS section     Objective:    BP 130/78   Pulse 84   Temp 97.7 F (36.5 C) (Oral)   Wt 168 lb (76.2 kg)   BMI 24.81 kg/m   Wt Readings from Last 3 Encounters:  05/25/16 168 lb (76.2 kg)  02/24/16 165 lb 4 oz (75 kg)  02/15/16 168 lb (76.2 kg)    Physical Exam  Constitutional: He appears well-developed and well-nourished. No distress.  HENT:  Head: Normocephalic and atraumatic.  Right Ear: External ear normal.  Left Ear: External ear normal.  Nose: Nose normal.  Mouth/Throat: Oropharynx is clear and moist. No oropharyngeal  exudate.  Eyes: Conjunctivae and EOM are normal. Pupils are equal, round, and reactive to light. No scleral icterus.  Neck: Normal range of motion. Neck supple.  Cardiovascular: Normal rate, regular rhythm, normal heart sounds and intact distal pulses.   No murmur heard. Pulmonary/Chest: Effort normal and breath sounds normal. No respiratory distress. He has no wheezes. He has no rales.  Musculoskeletal: He exhibits no edema.  See HPI for foot exam if done  Lymphadenopathy:    He has no cervical adenopathy.  Skin: Skin is warm and dry. No rash  noted.  Psychiatric: He has a normal mood and affect.  Nursing note and vitals reviewed.  Results for orders placed or performed in visit on 01/23/16  Hemoglobin A1c  Result Value Ref Range   Hgb A1c MFr Bld 7.1 (H) 4.6 - 6.5 %  Basic metabolic panel  Result Value Ref Range   Sodium 139 135 - 145 mEq/L   Potassium 4.5 3.5 - 5.1 mEq/L   Chloride 107 96 - 112 mEq/L   CO2 29 19 - 32 mEq/L   Glucose, Bld 143 (H) 70 - 99 mg/dL   BUN 18 6 - 23 mg/dL   Creatinine, Ser 0.77 0.40 - 1.50 mg/dL   Calcium 9.5 8.4 - 10.5 mg/dL   GFR 105.84 >60.00 mL/min  LDL Cholesterol, Direct  Result Value Ref Range   Direct LDL 65.0 mg/dL      Assessment & Plan:   Problem List Items Addressed This Visit    Diabetes mellitus type 2, controlled, with complications ([place]) - Primary    Chronic, stable. Doing well with combo janumet, continue. Check a1c today Foot exam today      Relevant Medications   insulin glargine (LANTUS) 100 UNIT/ML injection   Other Relevant Orders   Hemoglobin A1c   HLD (hyperlipidemia)    Chronic, stable. Continue current regimen.      HTN (hypertension)    Chronic, stable. Continue current regimen.          Follow up plan: Return in about 4 months (around 09/22/2016) for medicare wellness visit.  Ria Bush, MD

## 2016-05-25 NOTE — Assessment & Plan Note (Signed)
Chronic, stable. Doing well with combo janumet, continue. Check a1c today Foot exam today

## 2016-05-25 NOTE — Assessment & Plan Note (Signed)
Chronic, stable. Continue current regimen. 

## 2016-05-25 NOTE — Progress Notes (Signed)
Pre visit review using our clinic review tool, if applicable. No additional management support is needed unless otherwise documented below in the visit note. 

## 2016-05-25 NOTE — Patient Instructions (Signed)
Return as needed or in 4 months for medicare wellness visit You are doing well today Labs today Continue current medications.

## 2016-06-21 ENCOUNTER — Other Ambulatory Visit: Payer: Self-pay | Admitting: Family Medicine

## 2016-06-21 DIAGNOSIS — Z794 Long term (current) use of insulin: Principal | ICD-10-CM

## 2016-06-21 DIAGNOSIS — E118 Type 2 diabetes mellitus with unspecified complications: Secondary | ICD-10-CM

## 2016-07-18 ENCOUNTER — Other Ambulatory Visit: Payer: Self-pay | Admitting: Cardiovascular Disease

## 2016-07-18 MED ORDER — CARVEDILOL 25 MG PO TABS: 25.0000 mg | ORAL_TABLET | Freq: Two times a day (BID) | ORAL | 3 refills | Status: DC

## 2016-07-31 ENCOUNTER — Other Ambulatory Visit: Payer: Self-pay | Admitting: Family Medicine

## 2016-09-13 ENCOUNTER — Other Ambulatory Visit: Payer: Self-pay | Admitting: Family Medicine

## 2016-09-16 ENCOUNTER — Other Ambulatory Visit: Payer: Self-pay | Admitting: Family Medicine

## 2016-09-16 DIAGNOSIS — E559 Vitamin D deficiency, unspecified: Secondary | ICD-10-CM

## 2016-09-16 DIAGNOSIS — E785 Hyperlipidemia, unspecified: Secondary | ICD-10-CM

## 2016-09-16 DIAGNOSIS — Z794 Long term (current) use of insulin: Principal | ICD-10-CM

## 2016-09-16 DIAGNOSIS — N401 Enlarged prostate with lower urinary tract symptoms: Secondary | ICD-10-CM

## 2016-09-16 DIAGNOSIS — N138 Other obstructive and reflux uropathy: Secondary | ICD-10-CM

## 2016-09-16 DIAGNOSIS — E349 Endocrine disorder, unspecified: Secondary | ICD-10-CM

## 2016-09-16 DIAGNOSIS — E118 Type 2 diabetes mellitus with unspecified complications: Secondary | ICD-10-CM

## 2016-09-21 ENCOUNTER — Other Ambulatory Visit (INDEPENDENT_AMBULATORY_CARE_PROVIDER_SITE_OTHER): Payer: Medicare Other

## 2016-09-21 ENCOUNTER — Other Ambulatory Visit: Payer: Self-pay | Admitting: Family Medicine

## 2016-09-21 DIAGNOSIS — N401 Enlarged prostate with lower urinary tract symptoms: Secondary | ICD-10-CM | POA: Diagnosis not present

## 2016-09-21 DIAGNOSIS — N138 Other obstructive and reflux uropathy: Secondary | ICD-10-CM | POA: Diagnosis not present

## 2016-09-21 DIAGNOSIS — E118 Type 2 diabetes mellitus with unspecified complications: Secondary | ICD-10-CM

## 2016-09-21 DIAGNOSIS — E349 Endocrine disorder, unspecified: Secondary | ICD-10-CM | POA: Diagnosis not present

## 2016-09-21 DIAGNOSIS — E785 Hyperlipidemia, unspecified: Secondary | ICD-10-CM

## 2016-09-21 DIAGNOSIS — E559 Vitamin D deficiency, unspecified: Secondary | ICD-10-CM

## 2016-09-21 DIAGNOSIS — Z794 Long term (current) use of insulin: Secondary | ICD-10-CM

## 2016-09-21 LAB — COMPREHENSIVE METABOLIC PANEL
ALBUMIN: 4.4 g/dL (ref 3.5–5.2)
ALT: 17 U/L (ref 0–53)
AST: 15 U/L (ref 0–37)
Alkaline Phosphatase: 65 U/L (ref 39–117)
BILIRUBIN TOTAL: 0.5 mg/dL (ref 0.2–1.2)
BUN: 18 mg/dL (ref 6–23)
CALCIUM: 9.7 mg/dL (ref 8.4–10.5)
CHLORIDE: 106 meq/L (ref 96–112)
CO2: 30 mEq/L (ref 19–32)
CREATININE: 0.76 mg/dL (ref 0.40–1.50)
GFR: 107.25 mL/min (ref >60.00)
Glucose, Bld: 152 mg/dL — ABNORMAL HIGH (ref 70–99)
Potassium: 4.6 mEq/L (ref 3.5–5.1)
Sodium: 140 mEq/L (ref 135–145)
Total Protein: 6.6 g/dL (ref 6.0–8.3)

## 2016-09-21 LAB — LIPID PANEL
CHOLESTEROL: 109 mg/dL (ref 0–200)
HDL: 35.5 mg/dL — AB (ref >39.00)
LDL CALC: 64 mg/dL (ref 0–99)
NonHDL: 73.82
TRIGLYCERIDES: 48 mg/dL (ref 0.0–149.0)
Total CHOL/HDL Ratio: 3
VLDL: 9.6 mg/dL (ref 0.0–40.0)

## 2016-09-21 LAB — VITAMIN D 25 HYDROXY (VIT D DEFICIENCY, FRACTURES): VITD: 32.13 ng/mL (ref 30.00–100.00)

## 2016-09-21 LAB — HEMOGLOBIN A1C: HEMOGLOBIN A1C: 8 % — AB (ref 4.6–6.5)

## 2016-09-21 LAB — TSH: TSH: 0.79 u[IU]/mL (ref 0.35–4.50)

## 2016-09-21 LAB — PSA: PSA: 2.13 ng/mL (ref 0.10–4.00)

## 2016-09-24 ENCOUNTER — Other Ambulatory Visit: Payer: Self-pay | Admitting: Family Medicine

## 2016-09-24 ENCOUNTER — Encounter: Payer: Medicare Other | Admitting: Family Medicine

## 2016-10-03 ENCOUNTER — Encounter: Payer: Self-pay | Admitting: Family Medicine

## 2016-10-03 ENCOUNTER — Ambulatory Visit (INDEPENDENT_AMBULATORY_CARE_PROVIDER_SITE_OTHER): Payer: Medicare Other | Admitting: Family Medicine

## 2016-10-03 VITALS — BP 128/70 | HR 96 | Temp 98.2°F | Ht 69.0 in | Wt 165.8 lb

## 2016-10-03 DIAGNOSIS — Z0000 Encounter for general adult medical examination without abnormal findings: Secondary | ICD-10-CM

## 2016-10-03 DIAGNOSIS — Z7189 Other specified counseling: Secondary | ICD-10-CM

## 2016-10-03 DIAGNOSIS — E118 Type 2 diabetes mellitus with unspecified complications: Secondary | ICD-10-CM

## 2016-10-03 DIAGNOSIS — Z794 Long term (current) use of insulin: Secondary | ICD-10-CM

## 2016-10-03 DIAGNOSIS — N401 Enlarged prostate with lower urinary tract symptoms: Secondary | ICD-10-CM

## 2016-10-03 DIAGNOSIS — E559 Vitamin D deficiency, unspecified: Secondary | ICD-10-CM

## 2016-10-03 DIAGNOSIS — E785 Hyperlipidemia, unspecified: Secondary | ICD-10-CM

## 2016-10-03 DIAGNOSIS — M8580 Other specified disorders of bone density and structure, unspecified site: Secondary | ICD-10-CM

## 2016-10-03 DIAGNOSIS — N138 Other obstructive and reflux uropathy: Secondary | ICD-10-CM

## 2016-10-03 DIAGNOSIS — I2510 Atherosclerotic heart disease of native coronary artery without angina pectoris: Secondary | ICD-10-CM

## 2016-10-03 DIAGNOSIS — I10 Essential (primary) hypertension: Secondary | ICD-10-CM

## 2016-10-03 DIAGNOSIS — R740 Nonspecific elevation of levels of transaminase and lactic acid dehydrogenase [LDH]: Secondary | ICD-10-CM

## 2016-10-03 DIAGNOSIS — R7401 Elevation of levels of liver transaminase levels: Secondary | ICD-10-CM

## 2016-10-03 NOTE — Assessment & Plan Note (Signed)
Chronic, stable. Continue current regimen. 

## 2016-10-03 NOTE — Assessment & Plan Note (Signed)
asxs. On aspirin, statin.

## 2016-10-03 NOTE — Assessment & Plan Note (Signed)

## 2016-10-03 NOTE — Assessment & Plan Note (Signed)
Preventative protocols reviewed and updated unless pt declined. Discussed healthy diet and lifestyle.  

## 2016-10-03 NOTE — Assessment & Plan Note (Signed)
Brittle diabetes. Chronic. Deteriorated however pt endorses good cbg's at home. No changes today. RTC 6 mo DM f/u visit.

## 2016-10-03 NOTE — Assessment & Plan Note (Signed)
Discussed importance of calcium in diet, vitamin D consistency, and regular weight bearing exercises.  Last DEXA 2015.

## 2016-10-03 NOTE — Progress Notes (Signed)
BP 128/70   Pulse 96   Temp 98.2 F (36.8 C)   Ht 5\' 9"  (1.753 m)   Wt 165 lb 12 oz (75.2 kg)   BMI 24.48 kg/m    CC: AMW/CPE Subjective:    Patient ID: Norman Marks, male    DOB: [birth year], 71 y.o.   MRN: [id number]  HPI: Norman Marks is a 71 y.o. male presenting on 10/03/2016 for Annual Exam   Wears hearing aides Vision exam with eye clinic yearly. Denies falls  Denies depression/anhedonia.   Preventative: COLONOSCOPY Date: 10/2012 tubular adenoma, mild diverticulosis, rpt 5 yrs Deatra Ina) Prostate cancer screening - yearly. 1-3 nocturia. Some hesitancy.  DEXA date: 07/2013 osteopenia T -2.2 femur, -1.2 spine Flu yearly penumovax 06/28/2010. prevnar 09/2013 Tetanus around 2011  Zostavax - 01/2013  Advanced directive: doesn't have one set up. Has talked about this with wife.Wife would be HCPOA.Does not want drastic measures. Seat belt use discussed Sunscreen use discussed. No changing moles on skin. Sees derm regularly Ex smoker - quit 1994 Alcohol - none  Caffeine: 1 cup coffee/day  Lives with wife, 1 dog, 5 cats.  Occupation: retired  Edu: 12th grade  Activity: no regular exercise.  Diet: good water, fruits/vegetables daily   Relevant past medical, surgical, family and social history reviewed and updated as indicated. Interim medical history since our last visit reviewed. Allergies and medications reviewed and updated. Outpatient Medications Prior to Visit  Medication Sig Dispense Refill  . aspirin 81 MG tablet Take 81 mg by mouth daily.    . B-D INS SYR ULTRAFINE 1CC/31G 31G X 5/16" 1 ML MISC USE TO INJECT INSULIN AS DIRECTED 90 each 3  . carvedilol (COREG) 25 MG tablet Take 1 tablet (25 mg total) by mouth 2 (two) times daily with a meal. 60 tablet 3  . Cholecalciferol (VITAMIN D) 2000 units CAPS Take 1 capsule by mouth daily.    . insulin glargine (LANTUS) 100 UNIT/ML injection Inject 40 Units into the skin at bedtime.    . Insulin Pen Needle (EXEL  PEN NEEDLES 31GX1/4") 31G X 6 MM MISC Use as directed 100 each 3  . JANUMET XR [phone] MG TB24 TAKE 1 TABLET BY MOUTH ONCE DAILY 30 tablet 6  . lovastatin (MEVACOR) 40 MG tablet Take 1 tablet (40 mg total) by mouth at bedtime. 90 tablet 2  . ONE TOUCH ULTRA TEST test strip CHECK BLOOD SUGAR ONCE OR TWICE DAILY ASDIRECTED 100 each 3  . ramipril (ALTACE) 10 MG capsule TAKE 1 CAPSULE BY MOUTH ONCE DAILY 30 capsule 6  . insulin glargine (LANTUS) 100 UNIT/ML injection Inject 0.4 mLs (40 Units total) into the skin at bedtime. 10 mL 3   No facility-administered medications prior to visit.      Per HPI unless specifically indicated in ROS section below Review of Systems  Constitutional: Negative for activity change, appetite change, chills, fatigue, fever and unexpected weight change.  HENT: Negative for hearing loss.   Eyes: Negative for visual disturbance.  Respiratory: Negative for cough, chest tightness, shortness of breath and wheezing.   Cardiovascular: Positive for leg swelling (noted at night). Negative for chest pain and palpitations.  Gastrointestinal: Positive for abdominal pain (recent GI discomfort), blood in stool (?hemorrhoids) and diarrhea (mild). Negative for abdominal distention, constipation, nausea and vomiting.  Genitourinary: Negative for difficulty urinating and hematuria.  Musculoskeletal: Negative for arthralgias, myalgias and neck pain.  Skin: Negative for rash.  Neurological: Negative for dizziness, seizures, syncope and headaches.  Hematological: Negative for adenopathy. Does not bruise/bleed easily.  Psychiatric/Behavioral: Negative for dysphoric mood. The patient is not nervous/anxious.        Objective:    BP 128/70   Pulse 96   Temp 98.2 F (36.8 C)   Ht 5\' 9"  (1.753 m)   Wt 165 lb 12 oz (75.2 kg)   BMI 24.48 kg/m   Wt Readings from Last 3 Encounters:  10/03/16 165 lb 12 oz (75.2 kg)  05/25/16 168 lb (76.2 kg)  02/24/16 165 lb 4 oz (75 kg)      Physical Exam  Constitutional: He is oriented to person, place, and time. He appears well-developed and well-nourished. No distress.  HENT:  Head: Normocephalic and atraumatic.  Right Ear: Hearing, tympanic membrane, external ear and ear canal normal.  Left Ear: Hearing, tympanic membrane, external ear and ear canal normal.  Nose: Nose normal.  Mouth/Throat: Uvula is midline, oropharynx is clear and moist and mucous membranes are normal. No oropharyngeal exudate, posterior oropharyngeal edema or posterior oropharyngeal erythema.  Eyes: Conjunctivae and EOM are normal. Pupils are equal, round, and reactive to light. No scleral icterus.  Neck: Normal range of motion. Neck supple. Carotid bruit is not present. No thyromegaly present.  Cardiovascular: Normal rate, regular rhythm, normal heart sounds and intact distal pulses.   No murmur heard. Pulses:      Radial pulses are 2+ on the right side, and 2+ on the left side.  Pulmonary/Chest: Effort normal and breath sounds normal. No respiratory distress. He has no wheezes. He has no rales.  Abdominal: Soft. Bowel sounds are normal. He exhibits no distension and no mass. There is no tenderness. There is no rebound and no guarding.  Genitourinary: Rectal exam shows external hemorrhoid (non inflamed). Rectal exam shows no internal hemorrhoid, no fissure, no mass, no tenderness and anal tone normal. Prostate is enlarged (30gm). Prostate is not tender.  Musculoskeletal: Normal range of motion. He exhibits no edema.  Lymphadenopathy:    He has no cervical adenopathy.  Neurological: He is alert and oriented to person, place, and time.  CN grossly intact, station and gait intact Recall 3/3 Calculation 5/5 serial 3s  Skin: Skin is warm and dry. No rash noted.  Psychiatric: He has a normal mood and affect. His behavior is normal. Judgment and thought content normal.  Nursing note and vitals reviewed.  Results for orders placed or performed in visit on  09/21/16  Lipid panel  Result Value Ref Range   Cholesterol 109 0 - 200 mg/dL   Triglycerides 48.0 0.0 - 149.0 mg/dL   HDL 35.50 (L) >39.00 mg/dL   VLDL 9.6 0.0 - 40.0 mg/dL   LDL Cholesterol 64 0 - 99 mg/dL   Total CHOL/HDL Ratio 3    NonHDL 73.82   Comprehensive metabolic panel  Result Value Ref Range   Sodium 140 135 - 145 mEq/L   Potassium 4.6 3.5 - 5.1 mEq/L   Chloride 106 96 - 112 mEq/L   CO2 30 19 - 32 mEq/L   Glucose, Bld 152 (H) 70 - 99 mg/dL   BUN 18 6 - 23 mg/dL   Creatinine, Ser 0.76 0.40 - 1.50 mg/dL   Total Bilirubin 0.5 0.2 - 1.2 mg/dL   Alkaline Phosphatase 65 39 - 117 U/L   AST 15 0 - 37 U/L   ALT 17 0 - 53 U/L   Total Protein 6.6 6.0 - 8.3 g/dL   Albumin 4.4 3.5 - 5.2 g/dL  Calcium 9.7 8.4 - 10.5 mg/dL   GFR 107.25 >60.00 mL/min  Hemoglobin A1c  Result Value Ref Range   Hgb A1c MFr Bld 8.0 (H) 4.6 - 6.5 %  VITAMIN D 25 Hydroxy (Vit-D Deficiency, Fractures)  Result Value Ref Range   VITD 32.13 30.00 - 100.00 ng/mL  PSA  Result Value Ref Range   PSA 2.13 0.10 - 4.00 ng/mL  TSH  Result Value Ref Range   TSH 0.79 0.35 - 4.50 uIU/mL      Assessment & Plan:   Problem List Items Addressed This Visit    Advanced care planning/counseling discussion    Advanced directive: doesn't have one set up. Has talked about this with wife.Wife would be HCPOA.Does not want drastic measures.      BPH with obstruction/lower urinary tract symptoms    Stable period. DRE/PSA reassuring.      CAD (coronary artery disease)    asxs. On aspirin, statin.       Diabetes mellitus type 2, controlled, with complications ([place])    Brittle diabetes. Chronic. Deteriorated however pt endorses good cbg's at home. No changes today. RTC 6 mo DM f/u visit.       Health maintenance examination    Preventative protocols reviewed and updated unless pt declined. Discussed healthy diet and lifestyle.       HLD (hyperlipidemia)    Chronic, stable. Continue current regimen       HTN (hypertension)    Chronic, stable. Continue current regimen      Medicare annual wellness visit, subsequent - Primary    I have personally reviewed the Medicare Annual Wellness questionnaire and have noted 1. The patient's medical and social history 2. Their use of alcohol, tobacco or illicit drugs 3. Their current medications and supplements 4. The patient's functional ability including ADL's, fall risks, home safety risks and hearing or visual impairment. Cognitive function has been assessed and addressed as indicated.  5. Diet and physical activity 6. Evidence for depression or mood disorders The patients weight, height, BMI have been recorded in the chart. I have made referrals, counseling and provided education to the patient based on review of the above and I have provided the pt with a written personalized care plan for preventive services. Provider list updated.. See scanned questionairre as needed for further documentation. Reviewed preventative protocols and updated unless pt declined.       Osteopenia    Discussed importance of calcium in diet, vitamin D consistency, and regular weight bearing exercises.  Last DEXA 2015.       Transaminitis    Recent readings normal.       Vitamin D deficiency    Compliant with vit D 2000 IU daily.          Follow up plan: Return in about 6 months (around 04/05/2017) for follow up visit.  Ria Bush, MD

## 2016-10-03 NOTE — Assessment & Plan Note (Signed)
Recent readings normal.  

## 2016-10-03 NOTE — Patient Instructions (Addendum)
Work on Scientist, physiological.  You are doing well today Try to increase weight bearing exercise for bone strength.  Try to take metformin in the morning with breakfast.  Return as needed or in 6 months for follow up visit on diabetes.   Health Maintenance, Male A healthy lifestyle and preventive care is important for your health and wellness. Ask your health care provider about what schedule of regular examinations is right for you. What should I know about weight and diet?  Eat a Healthy Diet  Eat plenty of vegetables, fruits, whole grains, low-fat dairy products, and lean protein.  Do not eat a lot of foods high in solid fats, added sugars, or salt. Maintain a Healthy Weight  Regular exercise can help you achieve or maintain a healthy weight. You should:  Do at least 150 minutes of exercise each week. The exercise should increase your heart rate and make you sweat (moderate-intensity exercise).  Do strength-training exercises at least twice a week. Watch Your Levels of Cholesterol and Blood Lipids  Have your blood tested for lipids and cholesterol every 5 years starting at 72 years of age. If you are at high risk for heart disease, you should start having your blood tested when you are 72 years old. You may need to have your cholesterol levels checked more often if:  Your lipid or cholesterol levels are high.  You are older than 72 years of age.  You are at high risk for heart disease. What should I know about cancer screening? Many types of cancers can be detected early and may often be prevented. Lung Cancer  You should be screened every year for lung cancer if:  You are a current smoker who has smoked for at least 30 years.  You are a former smoker who has quit within the past 15 years.  Talk to your health care provider about your screening options, when you should start screening, and how often you should be screened. Colorectal Cancer  Routine colorectal cancer  screening usually begins at 72 years of age and should be repeated every 5-10 years until you are 72 years old. You may need to be screened more often if early forms of precancerous polyps or small growths are found. Your health care provider may recommend screening at an earlier age if you have risk factors for colon cancer.  Your health care provider may recommend using home test kits to check for hidden blood in the stool.  A small camera at the end of a tube can be used to examine your colon (sigmoidoscopy or colonoscopy). This checks for the earliest forms of colorectal cancer. Prostate and Testicular Cancer  Depending on your age and overall health, your health care provider may do certain tests to screen for prostate and testicular cancer.  Talk to your health care provider about any symptoms or concerns you have about testicular or prostate cancer. Skin Cancer  Check your skin from head to toe regularly.  Tell your health care provider about any new moles or changes in moles, especially if:  There is a change in a mole's size, shape, or color.  You have a mole that is larger than a pencil eraser.  Always use sunscreen. Apply sunscreen liberally and repeat throughout the day.  Protect yourself by wearing long sleeves, pants, a wide-brimmed hat, and sunglasses when outside. What should I know about heart disease, diabetes, and high blood pressure?  If you are 104-36 years of age, have your blood  pressure checked every 3-5 years. If you are 69 years of age or older, have your blood pressure checked every year. You should have your blood pressure measured twice-once when you are at a hospital or clinic, and once when you are not at a hospital or clinic. Record the average of the two measurements. To check your blood pressure when you are not at a hospital or clinic, you can use:  An automated blood pressure machine at a pharmacy.  A home blood pressure monitor.  Talk to your health  care provider about your target blood pressure.  If you are between 40-9 years old, ask your health care provider if you should take aspirin to prevent heart disease.  Have regular diabetes screenings by checking your fasting blood sugar level.  If you are at a normal weight and have a low risk for diabetes, have this test once every three years after the age of 19.  If you are overweight and have a high risk for diabetes, consider being tested at a younger age or more often.  A one-time screening for abdominal aortic aneurysm (AAA) by ultrasound is recommended for men aged 11-75 years who are current or former smokers. What should I know about preventing infection? Hepatitis B  If you have a higher risk for hepatitis B, you should be screened for this virus. Talk with your health care provider to find out if you are at risk for hepatitis B infection. Hepatitis C  Blood testing is recommended for:  Everyone born from [birth year] through 1966.  Anyone with known risk factors for hepatitis C. Sexually Transmitted Diseases (STDs)  You should be screened each year for STDs including gonorrhea and chlamydia if:  You are sexually active and are younger than 72 years of age.  You are older than 72 years of age and your health care provider tells you that you are at risk for this type of infection.  Your sexual activity has changed since you were last screened and you are at an increased risk for chlamydia or gonorrhea. Ask your health care provider if you are at risk.  Talk with your health care provider about whether you are at high risk of being infected with HIV. Your health care provider may recommend a prescription medicine to help prevent HIV infection. What else can I do?  Schedule regular health, dental, and eye exams.  Stay current with your vaccines (immunizations).  Do not use any tobacco products, such as cigarettes, chewing tobacco, and e-cigarettes. If you need help quitting,  ask your health care provider.  Limit alcohol intake to no more than 2 drinks per day. One drink equals 12 ounces of beer, 5 ounces of wine, or 1 ounces of hard liquor.  Do not use street drugs.  Do not share needles.  Ask your health care provider for help if you need support or information about quitting drugs.  Tell your health care provider if you often feel depressed.  Tell your health care provider if you have ever been abused or do not feel safe at home. This information is not intended to replace advice given to you by your health care provider. Make sure you discuss any questions you have with your health care provider. Document Released: 12/29/2007 Document Revised: 02/29/2016 Document Reviewed: 04/05/2015 Elsevier Interactive Patient Education  2017 [employer].

## 2016-10-03 NOTE — Assessment & Plan Note (Signed)
Advanced directive: doesn't have one set up. Has talked about this with wife.Wife would be HCPOA.Does not want drastic measures.

## 2016-10-03 NOTE — Assessment & Plan Note (Signed)
Compliant with vit D 2000 IU daily.

## 2016-10-03 NOTE — Progress Notes (Signed)
Pre visit review using our clinic review tool, if applicable. No additional management support is needed unless otherwise documented below in the visit note. 

## 2016-10-03 NOTE — Assessment & Plan Note (Signed)
Stable period. DRE/PSA reassuring.

## 2016-11-16 ENCOUNTER — Other Ambulatory Visit: Payer: Self-pay | Admitting: Cardiovascular Disease

## 2016-11-16 NOTE — Telephone Encounter (Signed)
Requested Prescriptions   Signed Prescriptions Disp Refills  . carvedilol (COREG) 25 MG tablet 60 tablet 0    Sig: TAKE 1 TABLET BY MOUTH TWICE (2) DAILY WITH A MEAL    Authorizing Provider: Kathlyn Sacramento A    Ordering User: Janan Ridge

## 2016-12-07 ENCOUNTER — Other Ambulatory Visit: Payer: Self-pay | Admitting: Family Medicine

## 2016-12-17 ENCOUNTER — Other Ambulatory Visit: Payer: Self-pay | Admitting: Cardiovascular Disease

## 2017-01-15 ENCOUNTER — Other Ambulatory Visit: Payer: Self-pay | Admitting: Cardiovascular Disease

## 2017-01-22 ENCOUNTER — Other Ambulatory Visit: Payer: Self-pay | Admitting: Family Medicine

## 2017-01-22 DIAGNOSIS — Z794 Long term (current) use of insulin: Principal | ICD-10-CM

## 2017-01-22 DIAGNOSIS — E118 Type 2 diabetes mellitus with unspecified complications: Secondary | ICD-10-CM

## 2017-02-18 ENCOUNTER — Ambulatory Visit (INDEPENDENT_AMBULATORY_CARE_PROVIDER_SITE_OTHER): Payer: Medicare Other | Admitting: Cardiovascular Disease

## 2017-02-18 ENCOUNTER — Encounter: Payer: Self-pay | Admitting: Cardiovascular Disease

## 2017-02-18 VITALS — BP 110/60 | HR 85 | Ht 69.0 in | Wt 167.8 lb

## 2017-02-18 DIAGNOSIS — E785 Hyperlipidemia, unspecified: Secondary | ICD-10-CM | POA: Diagnosis not present

## 2017-02-18 DIAGNOSIS — I10 Essential (primary) hypertension: Secondary | ICD-10-CM

## 2017-02-18 DIAGNOSIS — I2510 Atherosclerotic heart disease of native coronary artery without angina pectoris: Secondary | ICD-10-CM | POA: Diagnosis not present

## 2017-02-18 NOTE — Patient Instructions (Signed)
Medication Instructions: Continue same medications.   Labwork: None.   Procedures/Testing: None.   Follow-Up: 1 year with Dr. [name].   Any Additional Special Instructions Will Be Listed Below (If Applicable).     If you need a refill on your cardiac medications before your next appointment, please call your pharmacy.   

## 2017-02-18 NOTE — Progress Notes (Signed)
Cardiology Office Note   Date:  02/18/2017   ID:  Norman Marks, DOB 1946-01-17, MRN [id number]  PCP:  Ria Bush, MD  Cardiologist:   Kathlyn Sacramento, MD   Chief Complaint  Patient presents with  . other    12 month follow up. Meds reviewed by the pt. verbally. "doing well."       History of Present Illness: Norman Marks is a 71 y.o. male who presents for a follow-up visit regarding coronary artery disease and left bundle branch block.  He had cardiac catheterization in 2009 which showed one-vessel coronary artery disease involving the proximal ramus (70% stenosis). He is being treated medically. Most recent nuclear stress test in June 2015 showed a fixed anteroseptal and inferoseptal defects with normal ejection fraction. The defects were felt to be due to left bundle branch block. He has known history of hypertension, diabetes and hyperlipidemia.   He had an echocardiogram done in January 2017 which showed an ejection fraction of 56-43%, grade 1 diastolic dysfunction, mildly dilated left atrium, mild mitral regurgitation and mild pulmonary hypertension.  He has been doing well overall with no chest pain, shortness of breath, palpitations or dizziness. He has been taking his medications regularly. Hemoglobin A1c was above target on most recent testing at 8.1 but he is already working on improved diet decreasing carbohydrate intake.   Past Medical History:  Diagnosis Date  . BPH (benign prostatic hypertrophy)   . CAD (coronary artery disease) 2009   [name] (cardiolite 2009 with 70% stenosis of proximal ramus, not amenable to PCI, rec treat medically) --> Dor[facility]a Hand  . Cancer ([place])    basal cell skin cancer  . DDD (degenerative disc disease), lumbar    s/p surgery  . Diabetes type 2, uncontrolled ([place]) 1990s   referred to [facility] 09/2015  . Dysrhythmia   . Headache(784.0)   . Heart murmur   . History of adenomatous polyp of colon 10/2012  . History of bundle branch  block   . History of chicken pox   . History of nephrolithiasis 2012   L ureter, R renal pelvis  . HLD (hyperlipidemia)   . HTN (hypertension)    mild  . Hx of migraines   . Osteoarthritis of [place] joint of thumb 2014   s/p injections ([place])  . Osteoarthritis of right shoulder 2015   s/p injections ([place])  . Osteopenia 06/2011   T score -3, completed 2 yrs forteo, T score improved to -2.2 (2015)  . PONV (postoperative nausea and vomiting)   . Vitamin D deficiency     Past Surgical History:  Procedure Laterality Date  . [place], 2008   lumbar; x 3, spinal fusion  . CARDIAC CATHETERIZATION  2009   70% stenosis ostium  . CARDIOVASCULAR STRESS TEST  2011   fixed defect inferior/septal walls without reversibility, LVEF 43% post stress  . COLONOSCOPY  10/2012   tubular adenoma, mild diverticulosis, rpt 5 yrs Deatra Ina)  . dexa  06/2011   osteoporosis T -2.6 spine, -1.9 hip  . dexa  07/2013   osteopenia T -2.2 femur, -1.2 spine  . FLEXIBLE SIGMOIDOSCOPY  2007   pt reported normal ([place])  . HAND SURGERY Left 10/2015   Parker arthritis repair [place] [facility])  . KNEE SURGERY Right   . SHOULDER SURGERY Right   . SKIN SURGERY  2006   skin cancer  . TOTAL SHOULDER ARTHROPLASTY Right 01/26/2014   Procedure: TOTAL SHOULDER ARTHROPLASTY;  Surgeon: Vonna Kotyk  Llana Aliment, MD;  Location: [place];  Service: Orthopedics;  Laterality: Right;  . US ECHOCARDIOGRAPHY  10/2008   nl LV fxn, EF 50%, mild MR, mild pulm HTN     Current Outpatient Prescriptions  Medication Sig Dispense Refill  . aspirin 81 MG tablet Take 81 mg by mouth daily.    . B-D INS SYR ULTRAFINE 1CC/31G 31G X 5/16" 1 ML MISC USE TO INJECT INSULIN AS DIRECTED 90 each 3  . carvedilol (COREG) 25 MG tablet TAKE 1 TABLET BY MOUTH TWICE A DAY WITH A MEAL 60 tablet 3  . Cholecalciferol (VITAMIN D) 2000 units CAPS Take 1 capsule by mouth daily.    . insulin glargine (LANTUS) 100 UNIT/ML injection Inject 40 Units into the skin at  bedtime.    . Insulin Pen Needle (EXEL PEN NEEDLES 31GX1/4") 31G X 6 MM MISC Use as directed 100 each 3  . LANTUS 100 UNIT/ML injection INJECT 0.4 ML (40 UNITS TOTAL) INTO THE SKIN AT BEDTIME 10 mL 5  . lovastatin (MEVACOR) 40 MG tablet Take 1 tablet (40 mg total) by mouth at bedtime. 90 tablet 2  . ONE TOUCH ULTRA TEST test strip CHECK BLOOD SUGAR ONCE OR TWICE DAILY ASDIRECTED 100 each 3  . ramipril (ALTACE) 10 MG capsule TAKE 1 CAPSULE BY MOUTH ONCE DAILY 30 capsule 6   No current facility-administered medications for this visit.     Allergies:   Metformin and related and Percocet [oxycodone-acetaminophen]    Social History:  The patient  reports that he quit smoking about 24 years ago. His smoking use included Cigarettes. He has a 2.50 pack-year smoking history. He quit smokeless tobacco use about 24 years ago. His smokeless tobacco use included Chew. He reports that he does not drink alcohol or use drugs.   Family History:  The patient's family history includes Cancer in his father and mother; Coronary artery disease (age of onset: 30) in his sister; Diabetes in his brother and father; Heart attack (age of onset: 77) in his sister; Heart disease in his mother.    ROS:  Please see the history of present illness.   Otherwise, review of systems are positive for none.   All other systems are reviewed and negative.    PHYSICAL EXAM: VS:  BP 110/60 (BP Location: Left Arm, Patient Position: Sitting, Cuff Size: Normal)   Pulse 85   Ht 5\' 9"  (1.753 m)   Wt 167 lb 12 oz (76.1 kg)   BMI 24.77 kg/m  , BMI Body mass index is 24.77 kg/m. GEN: Well nourished, well developed, in no acute distress  HEENT: normal  Neck: no JVD, carotid bruits, or masses Cardiac: RRR; no murmurs, rubs, or gallops,no edema  Respiratory:  clear to auscultation bilaterally, normal work of breathing GI: soft, nontender, nondistended, + BS MS: no deformity or atrophy  Skin: warm and dry, no rash Neuro:  Strength  and sensation are intact Psych: euthymic mood, full affect   EKG:  EKG is ordered today. The ekg ordered today demonstrates normal sinus rhythm with left bundle branch block.   Recent Labs: 09/21/2016: ALT 17; BUN 18; Creatinine, Ser 0.76; Potassium 4.6; Sodium 140; TSH 0.79    Lipid Panel    Component Value Date/Time   CHOL 109 09/21/2016 0813   TRIG 48.0 09/21/2016 0813   HDL 35.50 (L) 09/21/2016 0813   CHOLHDL 3 09/21/2016 0813   VLDL 9.6 09/21/2016 0813   LDLCALC 64 09/21/2016 0813   LDLDIRECT 65.0  01/23/2016 0931      Wt Readings from Last 3 Encounters:  02/18/17 167 lb 12 oz (76.1 kg)  10/03/16 165 lb 12 oz (75.2 kg)  05/25/16 168 lb (76.2 kg)        ASSESSMENT AND PLAN:  1.  Coronary artery disease involving native coronary arteries without angina: He is doing very well overall with no anginal symptoms. Continue medical therapy. Most recent nuclear stress test in 2015 showed no evidence of ischemia.  2. Left bundle branch block:  most recent echocardiogram this year showed an EF of 50-55%. No symptoms of heart failure or arrhythmia.  3. Essential hypertension: Blood pressure is well controlled.  4. Hyperlipidemia: LDL remains below 70. Continue lovastatin.  Disposition:   FU with me in 1 year  Signed,  Kathlyn Sacramento, MD  02/18/2017 2:56 PM    [place]

## 2017-03-15 ENCOUNTER — Other Ambulatory Visit: Payer: Self-pay | Admitting: Family Medicine

## 2017-03-15 DIAGNOSIS — Z794 Long term (current) use of insulin: Principal | ICD-10-CM

## 2017-03-15 DIAGNOSIS — E118 Type 2 diabetes mellitus with unspecified complications: Secondary | ICD-10-CM

## 2017-03-25 ENCOUNTER — Other Ambulatory Visit: Payer: Self-pay | Admitting: Family Medicine

## 2017-04-05 ENCOUNTER — Encounter: Payer: Self-pay | Admitting: Family Medicine

## 2017-04-05 ENCOUNTER — Ambulatory Visit (INDEPENDENT_AMBULATORY_CARE_PROVIDER_SITE_OTHER): Payer: Medicare Other | Admitting: Family Medicine

## 2017-04-05 VITALS — BP 120/64 | HR 87 | Temp 98.2°F | Wt 167.5 lb

## 2017-04-05 DIAGNOSIS — Z794 Long term (current) use of insulin: Secondary | ICD-10-CM

## 2017-04-05 DIAGNOSIS — M8580 Other specified disorders of bone density and structure, unspecified site: Secondary | ICD-10-CM | POA: Diagnosis not present

## 2017-04-05 DIAGNOSIS — Z23 Encounter for immunization: Secondary | ICD-10-CM | POA: Diagnosis not present

## 2017-04-05 DIAGNOSIS — E559 Vitamin D deficiency, unspecified: Secondary | ICD-10-CM | POA: Diagnosis not present

## 2017-04-05 DIAGNOSIS — E118 Type 2 diabetes mellitus with unspecified complications: Secondary | ICD-10-CM

## 2017-04-05 LAB — BASIC METABOLIC PANEL
BUN: 14 mg/dL (ref 6–23)
CHLORIDE: 106 meq/L (ref 96–112)
CO2: 29 meq/L (ref 19–32)
CREATININE: 0.78 mg/dL (ref 0.40–1.50)
Calcium: 9.2 mg/dL (ref 8.4–10.5)
GFR: 103.93 mL/min (ref >60.00)
GLUCOSE: 109 mg/dL — AB (ref 70–99)
POTASSIUM: 4.1 meq/L (ref 3.5–5.1)
Sodium: 141 mEq/L (ref 135–145)

## 2017-04-05 LAB — HEMOGLOBIN A1C: Hgb A1c MFr Bld: 7.2 % — ABNORMAL HIGH (ref 4.6–6.5)

## 2017-04-05 NOTE — Assessment & Plan Note (Signed)
Completed 2 yrs forteo, now off med for last 3-4 yrs. Will update DEXA.

## 2017-04-05 NOTE — Assessment & Plan Note (Signed)
Continue vit D 2000 IU daily.  

## 2017-04-05 NOTE — Assessment & Plan Note (Signed)
Chronic. Stable. Continue current regimen.  RTC 6 mo DM f/u visit.

## 2017-04-05 NOTE — Progress Notes (Signed)
BP 120/64 (BP Location: Left Arm, Patient Position: Sitting, Cuff Size: Normal)   Pulse 87   Temp 98.2 F (36.8 C) (Oral)   Wt 167 lb 8 oz (76 kg)   SpO2 97%   BMI 24.74 kg/m    CC: 6 mo DM f/u visit Subjective:    Patient ID: Norman Marks, male    DOB: April 21, 1946, 71 y.o.   MRN: [id number]  HPI: DRAVEN NATTER is a 71 y.o. male presenting on 04/05/2017 for 6 mo follow-up   DM - regularly does check sugars fasting running <150. Compliant with antihyperglycemic regimen which includes: lantus 40u qhs, janumet xr 100/1000 once daily. Denies low sugars or hypoglycemic symptoms. Denies paresthesias. Diabetic eye exam scheduled 04/2017. Pneumovax: 2011. Prevnar: 2015. Tries to follow diabetic diet.  Lab Results  Component Value Date   HGBA1C 8.0 (H) 09/21/2016   Diabetic Foot Exam - Simple   Simple Foot Form Diabetic Foot exam was performed with the following findings:  Yes 04/05/2017  8:19 AM  Visual Inspection No deformities, no ulcerations, no other skin breakdown bilaterally:  Yes Sensation Testing Intact to touch and monofilament testing bilaterally:  Yes Pulse Check Posterior Tibialis and Dorsalis pulse intact bilaterally:  Yes Comments L foot in boot      Osteopenia - h/o osteoporosis s/p 2 yrs of forteo and density improved to osteopenia range. Now off medication and on vitamin D.   Relevant past medical, surgical, family and social history reviewed and updated as indicated. Interim medical history since our last visit reviewed. Allergies and medications reviewed and updated. Outpatient Medications Prior to Visit  Medication Sig Dispense Refill  . aspirin 81 MG tablet Take 81 mg by mouth daily.    . B-D INS SYR ULTRAFINE 1CC/31G 31G X 5/16" 1 ML MISC USE TO INJECT INSULIN AS DIRECTED 90 each 3  . carvedilol (COREG) 25 MG tablet TAKE 1 TABLET BY MOUTH TWICE A DAY WITH A MEAL 60 tablet 3  . Cholecalciferol (VITAMIN D) 2000 units CAPS Take 1 capsule by mouth daily.      . Insulin Pen Needle (EXEL PEN NEEDLES 31GX1/4") 31G X 6 MM MISC Use as directed 100 each 3  . JANUMET XR [phone] MG TB24 TAKE 1 TABLET BY MOUTH ONCE A DAY 90 tablet 1  . LANTUS 100 UNIT/ML injection INJECT 0.4 ML (40 UNITS TOTAL) INTO THE SKIN AT BEDTIME 10 mL 5  . lovastatin (MEVACOR) 40 MG tablet TAKE 1 TABLET BY MOUTH AT BEDTIME 90 tablet 2  . ONE TOUCH ULTRA TEST test strip CHECK BLOOD SUGAR ONCE OR TWICE DAILY ASDIRECTED 100 each 3  . ramipril (ALTACE) 10 MG capsule TAKE 1 CAPSULE BY MOUTH ONCE DAILY 90 capsule 1  . insulin glargine (LANTUS) 100 UNIT/ML injection Inject 40 Units into the skin at bedtime.     No facility-administered medications prior to visit.      Per HPI unless specifically indicated in ROS section below Review of Systems     Objective:    BP 120/64 (BP Location: Left Arm, Patient Position: Sitting, Cuff Size: Normal)   Pulse 87   Temp 98.2 F (36.8 C) (Oral)   Wt 167 lb 8 oz (76 kg)   SpO2 97%   BMI 24.74 kg/m   Wt Readings from Last 3 Encounters:  04/05/17 167 lb 8 oz (76 kg)  02/18/17 167 lb 12 oz (76.1 kg)  10/03/16 165 lb 12 oz (75.2 kg)    Physical Exam  Constitutional: He appears well-developed and well-nourished. No distress.  HENT:  Head: Normocephalic and atraumatic.  Right Ear: External ear normal.  Left Ear: External ear normal.  Nose: Nose normal.  Mouth/Throat: Oropharynx is clear and moist. No oropharyngeal exudate.  Eyes: Pupils are equal, round, and reactive to light. Conjunctivae and EOM are normal. No scleral icterus.  Neck: Normal range of motion. Neck supple.  Cardiovascular: Normal rate, regular rhythm, normal heart sounds and intact distal pulses.   No murmur heard. Pulmonary/Chest: Effort normal and breath sounds normal. No respiratory distress. He has no wheezes. He has no rales.  Musculoskeletal: He exhibits no edema.  See HPI for foot exam if done L foot in boot  Lymphadenopathy:    He has no cervical adenopathy.   Skin: Skin is warm and dry. No rash noted.  Psychiatric: He has a normal mood and affect.  Nursing note and vitals reviewed.  Results for orders placed or performed in visit on 09/21/16  Lipid panel  Result Value Ref Range   Cholesterol 109 0 - 200 mg/dL   Triglycerides 48.0 0.0 - 149.0 mg/dL   HDL 35.50 (L) >39.00 mg/dL   VLDL 9.6 0.0 - 40.0 mg/dL   LDL Cholesterol 64 0 - 99 mg/dL   Total CHOL/HDL Ratio 3    NonHDL 73.82   Comprehensive metabolic panel  Result Value Ref Range   Sodium 140 135 - 145 mEq/L   Potassium 4.6 3.5 - 5.1 mEq/L   Chloride 106 96 - 112 mEq/L   CO2 30 19 - 32 mEq/L   Glucose, Bld 152 (H) 70 - 99 mg/dL   BUN 18 6 - 23 mg/dL   Creatinine, Ser 0.76 0.40 - 1.50 mg/dL   Total Bilirubin 0.5 0.2 - 1.2 mg/dL   Alkaline Phosphatase 65 39 - 117 U/L   AST 15 0 - 37 U/L   ALT 17 0 - 53 U/L   Total Protein 6.6 6.0 - 8.3 g/dL   Albumin 4.4 3.5 - 5.2 g/dL   Calcium 9.7 8.4 - 10.5 mg/dL   GFR 107.25 >60.00 mL/min  Hemoglobin A1c  Result Value Ref Range   Hgb A1c MFr Bld 8.0 (H) 4.6 - 6.5 %  VITAMIN D 25 Hydroxy (Vit-D Deficiency, Fractures)  Result Value Ref Range   VITD 32.13 30.00 - 100.00 ng/mL  PSA  Result Value Ref Range   PSA 2.13 0.10 - 4.00 ng/mL  TSH  Result Value Ref Range   TSH 0.79 0.35 - 4.50 uIU/mL      Assessment & Plan:   Problem List Items Addressed This Visit    Diabetes mellitus type 2, controlled, with complications ([place]) - Primary    Chronic. Stable. Continue current regimen.  RTC 6 mo DM f/u visit.       Relevant Orders   Hemoglobin G4W   Basic metabolic panel   Osteopenia    Completed 2 yrs forteo, now off med for last 3-4 yrs. Will update DEXA.       Relevant Orders   DG Bone Density   Vitamin D deficiency    Continue vit D 2000 IU daily.        Other Visit Diagnoses    Need for influenza vaccination       Relevant Orders   Flu Vaccine QUAD 6+ mos PF IM (Fluarix Quad PF) (Completed)       Follow up  plan: Return in about 6 months (around 10/03/2017) for annual exam, prior fasting  for blood work, medicare wellness visit.  Ria Bush, MD

## 2017-04-05 NOTE — Patient Instructions (Addendum)
Flu shot today Labs today.  You are doing well today. Return as needed or in 6 months for medicare wellness visit with lesia and physical with me.  We will set you up for repeat bone density scan.

## 2017-04-08 ENCOUNTER — Telehealth: Payer: Self-pay | Admitting: Family Medicine

## 2017-04-08 NOTE — Telephone Encounter (Signed)
Pt called - he has bone density scheduled for [facility], however he got a call from [place].   Pt request cb [phone] Thanks

## 2017-04-09 NOTE — Telephone Encounter (Signed)
I called PT to confirm he is scheduled at [facility] on 10/23 for bone density. I'm not sure why [facility] called.

## 2017-05-01 LAB — HM DIABETES EYE EXAM

## 2017-05-07 ENCOUNTER — Ambulatory Visit (INDEPENDENT_AMBULATORY_CARE_PROVIDER_SITE_OTHER)
Admission: RE | Admit: 2017-05-07 | Discharge: 2017-05-07 | Disposition: A | Discharge status: Home / Self Care | Payer: Medicare Other | Source: Ambulatory Visit

## 2017-05-07 DIAGNOSIS — M85852 Other specified disorders of bone density and structure, left thigh: Secondary | ICD-10-CM

## 2017-05-07 DIAGNOSIS — M85851 Other specified disorders of bone density and structure, right thigh: Secondary | ICD-10-CM

## 2017-05-07 DIAGNOSIS — M8580 Other specified disorders of bone density and structure, unspecified site: Secondary | ICD-10-CM

## 2017-05-13 ENCOUNTER — Other Ambulatory Visit: Payer: Self-pay | Admitting: Cardiovascular Disease

## 2017-05-23 ENCOUNTER — Encounter: Payer: Self-pay | Admitting: Family Medicine

## 2017-06-05 ENCOUNTER — Other Ambulatory Visit: Payer: Self-pay | Admitting: Family Medicine

## 2017-06-07 ENCOUNTER — Other Ambulatory Visit: Payer: Self-pay | Admitting: Family Medicine

## 2017-06-13 ENCOUNTER — Telehealth: Payer: Self-pay

## 2017-06-13 ENCOUNTER — Ambulatory Visit (INDEPENDENT_AMBULATORY_CARE_PROVIDER_SITE_OTHER)
Admission: RE | Admit: 2017-06-13 | Discharge: 2017-06-13 | Disposition: A | Discharge status: Home / Self Care | Payer: Medicare Other | Source: Ambulatory Visit | Attending: Family Medicine | Admitting: Family Medicine

## 2017-06-13 ENCOUNTER — Encounter: Payer: Self-pay | Admitting: Family Medicine

## 2017-06-13 ENCOUNTER — Ambulatory Visit (INDEPENDENT_AMBULATORY_CARE_PROVIDER_SITE_OTHER): Payer: Medicare Other | Admitting: Family Medicine

## 2017-06-13 VITALS — BP 118/64 | HR 81 | Temp 97.6°F | Wt 166.0 lb

## 2017-06-13 DIAGNOSIS — M26621 Arthralgia of right temporomandibular joint: Secondary | ICD-10-CM | POA: Diagnosis not present

## 2017-06-13 DIAGNOSIS — Z01818 Encounter for other preprocedural examination: Secondary | ICD-10-CM | POA: Diagnosis not present

## 2017-06-13 DIAGNOSIS — M159 Polyosteoarthritis, unspecified: Secondary | ICD-10-CM

## 2017-06-13 DIAGNOSIS — E118 Type 2 diabetes mellitus with unspecified complications: Secondary | ICD-10-CM | POA: Diagnosis not present

## 2017-06-13 DIAGNOSIS — Z794 Long term (current) use of insulin: Secondary | ICD-10-CM

## 2017-06-13 DIAGNOSIS — M150 Primary generalized (osteo)arthritis: Secondary | ICD-10-CM | POA: Diagnosis not present

## 2017-06-13 MED ORDER — CYCLOBENZAPRINE HCL 5 MG PO TABS: 2.5000 mg | ORAL_TABLET | Freq: Two times a day (BID) | ORAL | 0 refills | Status: DC

## 2017-06-13 NOTE — Progress Notes (Signed)
BP 118/64 (BP Location: Left Arm, Patient Position: Sitting, Cuff Size: Normal)   Pulse 81   Temp 97.6 F (36.4 C) (Oral)   Wt 166 lb (75.3 kg)   SpO2 98%   BMI 24.51 kg/m    CC: ear pain, preop check  Subjective:    Patient ID: Norman Marks, male    DOB: February 20, 1946, 71 y.o.   MRN: [id number]  HPI: Norman Marks is a 71 y.o. male presenting on 06/13/2017 for Discuss DM and A1c and Ear Pain (bilateral, mainly right ear. Started about 2 wks ago. )   2 wk h/o bilateral ear pain R>L with radiation to TMJ. Left side discomfort has largely resolved. Denies hearing changes, drainage from ears, URI sxs, tinnitus. No fevers/chills, ST. Sees dentist twice yearly.   DM - on lantus 40u, janumet XR 100/1000mg  once daily. He has been more mindful with diabetic diet and has noticed better glycemic control.  Lab Results  Component Value Date   HGBA1C 7.2 (H) 04/05/2017     Osteopenia - DEXA 04/2017 T score -1.2 hip, -1.9 spine  Discussing possible ankle surgery ([facility]). Asks about preop eval. Will need cardiac clearance. Known multi-joint osteoarthritis  Has had previous surgeries latest L hand 1st [place] repair 10/2015, has tolerated GETA in the past. H/o PONV.  Denies chest pain, tightness, dyspnea, palpitations, dizziness, headaches. No cough.  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 Latest stress test 2015, latest echo 07/2015 with EF 50-55%.   Relevant past medical, surgical, family and social history reviewed and updated as indicated. Interim medical history since our last visit reviewed. Allergies and medications reviewed and updated. Outpatient Medications Prior to Visit  Medication Sig Dispense Refill  . aspirin 81 MG tablet Take 81 mg by mouth daily.    . B-D INS SYR ULTRAFINE 1CC/31G 31G X 5/16" 1 ML MISC USE TO INJECT INSULIN AS DIRECTED 90 each 3  . carvedilol (COREG) 25 MG tablet TAKE 1  TABLET BY MOUTH TWICE (2) DAILY WITH A MEAL 60 tablet 0  . Cholecalciferol (VITAMIN D) 2000 units CAPS Take 1 capsule by mouth daily.    . Insulin Pen Needle (EXEL PEN NEEDLES 31GX1/4") 31G X 6 MM MISC Use as directed 100 each 3  . JANUMET XR [phone] MG TB24 TAKE 1 TABLET BY MOUTH ONCE A DAY 90 tablet 1  . LANTUS 100 UNIT/ML injection INJECT 0.4 ML (40 UNITS TOTAL) INTO THE SKIN AT BEDTIME 10 mL 5  . lovastatin (MEVACOR) 40 MG tablet TAKE 1 TABLET BY MOUTH AT BEDTIME 90 tablet 2  . ONE TOUCH ULTRA TEST test strip CHECK BLOOD SUGAR ONCE OR TWICE DAILY ASDIRECTED 100 each 3  . ramipril (ALTACE) 10 MG capsule TAKE 1 CAPSULE BY MOUTH ONCE DAILY 90 capsule 1   No facility-administered medications prior to visit.      Per HPI unless specifically indicated in ROS section below Review of Systems     Objective:    BP 118/64 (BP Location: Left Arm, Patient Position: Sitting, Cuff Size: Normal)   Pulse 81   Temp 97.6 F (36.4 C) (Oral)   Wt 166 lb (75.3 kg)   SpO2 98%   BMI 24.51 kg/m   Wt Readings from Last 3 Encounters:  06/13/17 166 lb (75.3 kg)  04/05/17 167 lb 8 oz (76 kg)  02/18/17 167 lb 12 oz (76.1 kg)  Physical Exam  Constitutional: He appears well-developed and well-nourished. No distress.  HENT:  Head: Normocephalic and atraumatic.  Right Ear: Hearing, tympanic membrane, external ear and ear canal normal.  Left Ear: Hearing, tympanic membrane, external ear and ear canal normal.  Nose: Nose normal. No mucosal edema or rhinorrhea. Right sinus exhibits no maxillary sinus tenderness and no frontal sinus tenderness. Left sinus exhibits no maxillary sinus tenderness and no frontal sinus tenderness.  Mouth/Throat: Uvula is midline, oropharynx is clear and moist and mucous membranes are normal. No oropharyngeal exudate, posterior oropharyngeal edema, posterior oropharyngeal erythema or tonsillar abscesses.  Tender to palpation R TMJ with slight increased mobility compared to left    Eyes: Conjunctivae and EOM are normal. Pupils are equal, round, and reactive to light. No scleral icterus.  Neck: Normal range of motion. Neck supple. No thyromegaly present.  Cardiovascular: Normal rate, regular rhythm, normal heart sounds and intact distal pulses.  No murmur heard. Pulmonary/Chest: Effort normal and breath sounds normal. No respiratory distress. He has no wheezes. He has no rales.  Musculoskeletal: He exhibits no edema.  Lymphadenopathy:    He has no cervical adenopathy.  Skin: Skin is warm and dry. No rash noted.  Psychiatric: He has a normal mood and affect.  Nursing note and vitals reviewed.  Results for orders placed or performed in visit on 05/23/17  HM DIABETES EYE EXAM  Result Value Ref Range   HM Diabetic Eye Exam No Retinopathy No Retinopathy   Lab Results  Component Value Date   HGBA1C 7.2 (H) 04/05/2017    Lab Results  Component Value Date   CREATININE 0.78 04/05/2017    DG Chest 2 View Addendum: ADDENDUM REPORT: 06/13/2017 11:18   IMPRESSION:  No acute cardiopulmonary disease.   Electronically Signed    By: San Morelle M.D.    On: 06/13/2017 11:18 Narrative: CLINICAL DATA:  Preop evaluation for ankle surgery.  EXAM: CHEST  2 VIEW  COMPARISON:  Two-view chest x-ray 01/18/2014.  FINDINGS: The heart size is normal. No edema or effusion is present. There is no focal airspace disease. Right shoulder hemiarthroplasty is noted.  IMPRESSION: None  Electronically Signed: By: San Morelle M.D. On: 06/13/2017 09:49      Assessment & Plan:   Problem List Items Addressed This Visit    Diabetes mellitus type 2, controlled, with complications ([place])    Chronic, improved based on recall cbg's and endorsed healthy diet changes. Last A1c 7.2%, not due for repeat. He will return after 07/05/2017 for rpt A1c. Goal <7% perioperatively. Pt agrees with plan.       Relevant Orders   Hemoglobin A1c   Osteoarthritis   Relevant  Medications   cyclobenzaprine (FLEXERIL) 5 MG tablet   Preoperative clearance - Primary    Upcoming ankle surgery planned ([facility]). Recommended he check with Dr Fletcher Anon re cardiac clearance.  Check CXR today, labs.  RCRI = 1 (insulin use), anticipate adequately low risk to proceed with surgery. He has CAD but no known MI and medicallyl managed.       Relevant Orders   DG Chest 2 View (Completed)   CBC with Differential/Platelet   Basic metabolic panel   Protime-INR   TMJ tenderness, right    Ear exam clear. Anticipate R TMJ dysfunction - reviewed treatment options including heating pad, eccentric exercises, flexeril PRN. Update if not improving with treatment.           Follow up plan: Return as needed.  Ria Bush,  MD

## 2017-06-13 NOTE — Patient Instructions (Addendum)
Xray today.  We will check labs in 3 weeks.  Return after 07/05/2017 for labwork to check A1c.  Call Dr Fletcher Anon for cardiac clearance prior to surgery.  For jaw pain - I think you have TMJ pain. May use heating pad to jaw, ibuprofen, and may try flexeril muscle relaxant. Do exercises discussed today.  Temporomandibular Joint Syndrome Temporomandibular joint (TMJ) syndrome is a condition that affects the joints between your jaw and your skull. The TMJs are located near your ears and allow your jaw to open and close. These joints and the nearby muscles are involved in all movements of the jaw. People with TMJ syndrome have pain in the area of these joints and muscles. Chewing, biting, or other movements of the jaw can be difficult or painful. TMJ syndrome can be caused by various things. In many cases, the condition is mild and goes away within a few weeks. For some people, the condition can become a long-term problem. What are the causes? Possible causes of TMJ syndrome include:  Grinding your teeth or clenching your jaw. Some people do this when they are under stress.  Arthritis.  Injury to the jaw.  Head or neck injury.  Teeth or dentures that are not aligned well.  In some cases, the cause of TMJ syndrome may not be known. What are the signs or symptoms? The most common symptom is an aching pain on the side of the head in the area of the TMJ. Other symptoms may include:  Pain when moving your jaw, such as when chewing or biting.  Being unable to open your jaw all the way.  Making a clicking sound when you open your mouth.  Headache.  Earache.  Neck or shoulder pain.  How is this diagnosed? Diagnosis can usually be made based on your symptoms, your medical history, and a physical exam. Your health care provider may check the range of motion of your jaw. Imaging tests, such as X-rays or an MRI, are sometimes done. You may need to see your dentist to determine if your teeth and jaw  are lined up correctly. How is this treated? TMJ syndrome often goes away on its own. If treatment is needed, the options may include:  Eating soft foods and applying ice or heat.  Medicines to relieve pain or inflammation.  Medicines to relax the muscles.  A splint, bite plate, or mouthpiece to prevent teeth grinding or jaw clenching.  Relaxation techniques or counseling to help reduce stress.  Transcutaneous electrical nerve stimulation (TENS). This helps to relieve pain by applying an electrical current through the skin.  Acupuncture. This is sometimes helpful to relieve pain.  Jaw surgery. This is rarely needed.  Follow these instructions at home:  Take medicines only as directed by your health care provider.  Eat a soft diet if you are having trouble chewing.  Apply ice to the painful area. ? Put ice in a plastic bag. ? Place a towel between your skin and the bag. ? Leave the ice on for 20 minutes, 2-3 times a day.  Apply a warm compress to the painful area as directed.  Massage your jaw area and perform any jaw stretching exercises as recommended by your health care provider.  If you were given a mouthpiece or bite plate, wear it as directed.  Avoid foods that require a lot of chewing. Do not chew gum.  Keep all follow-up visits as directed by your health care provider. This is important. Contact a health  care provider if:  You are having trouble eating.  You have new or worsening symptoms. Get help right away if:  Your jaw locks open or closed. This information is not intended to replace advice given to you by your health care provider. Make sure you discuss any questions you have with your health care provider. Document Released: 03/27/2001 Document Revised: 03/01/2016 Document Reviewed: 02/04/2014 Elsevier Interactive Patient Education  [employer].

## 2017-06-13 NOTE — Telephone Encounter (Signed)
Norman Marks at [facility] transferred Norman Marks at [facility] radiology;Radiologist needing to know what type surgery pt is having. From 06/13/17 office note advised Norman Marks discuss possible ankle surgery([facility]); ask about preop eval and pt will need cardiac clearance; Norman Marks will let radiologist know. FYI to Dr Darnell Level.

## 2017-06-15 NOTE — Assessment & Plan Note (Addendum)
Ear exam clear. Anticipate R TMJ dysfunction - reviewed treatment options including heating pad, eccentric exercises, flexeril PRN. Update if not improving with treatment.

## 2017-06-15 NOTE — Assessment & Plan Note (Signed)
Chronic, improved based on recall cbg's and endorsed healthy diet changes. Last A1c 7.2%, not due for repeat. He will return after 07/05/2017 for rpt A1c. Goal <7% perioperatively. Pt agrees with plan.

## 2017-06-15 NOTE — Assessment & Plan Note (Signed)
Upcoming ankle surgery planned [facility]). Recommended he check with Dr Fletcher Anon re cardiac clearance.  Check CXR today, labs.  RCRI = 1 (insulin use), anticipate adequately low risk to proceed with surgery. He has CAD but no known MI and medicallyl managed.

## 2017-06-17 ENCOUNTER — Other Ambulatory Visit: Payer: Self-pay | Admitting: Cardiovascular Disease

## 2017-07-12 ENCOUNTER — Other Ambulatory Visit (INDEPENDENT_AMBULATORY_CARE_PROVIDER_SITE_OTHER): Payer: Medicare Other

## 2017-07-12 DIAGNOSIS — E118 Type 2 diabetes mellitus with unspecified complications: Secondary | ICD-10-CM

## 2017-07-12 DIAGNOSIS — Z794 Long term (current) use of insulin: Secondary | ICD-10-CM | POA: Diagnosis not present

## 2017-07-12 DIAGNOSIS — Z01818 Encounter for other preprocedural examination: Secondary | ICD-10-CM

## 2017-07-12 LAB — CBC WITH DIFFERENTIAL/PLATELET
BASOS ABS: 0 10*3/uL (ref 0.0–0.1)
BASOS PCT: 0.9 % (ref 0.0–3.0)
EOS ABS: 0.3 10*3/uL (ref 0.0–0.7)
Eosinophils Relative: 5 % (ref 0.0–5.0)
HEMATOCRIT: 42.8 % (ref 39.0–52.0)
HEMOGLOBIN: 14.4 g/dL (ref 13.0–17.0)
LYMPHS PCT: 19.2 % (ref 12.0–46.0)
Lymphs Abs: 1 10*3/uL (ref 0.7–4.0)
MCHC: 33.6 g/dL (ref 30.0–36.0)
MCV: 93.9 fl (ref 78.0–100.0)
Monocytes Absolute: 0.7 10*3/uL (ref 0.1–1.0)
Monocytes Relative: 12.2 % — ABNORMAL HIGH (ref 3.0–12.0)
Neutro Abs: 3.4 10*3/uL (ref 1.4–7.7)
Neutrophils Relative %: 62.7 % (ref 43.0–77.0)
Platelets: 234 10*3/uL (ref 150.0–400.0)
RBC: 4.55 Mil/uL (ref 4.22–5.81)
RDW: 12.8 % (ref 11.5–15.5)
WBC: 5.4 10*3/uL (ref 4.0–10.5)

## 2017-07-12 LAB — BASIC METABOLIC PANEL
BUN: 13 mg/dL (ref 6–23)
CHLORIDE: 106 meq/L (ref 96–112)
CO2: 31 mEq/L (ref 19–32)
CREATININE: 0.72 mg/dL (ref 0.40–1.50)
Calcium: 9.3 mg/dL (ref 8.4–10.5)
GFR: 113.9 mL/min (ref >60.00)
Glucose, Bld: 130 mg/dL — ABNORMAL HIGH (ref 70–99)
Potassium: 3.8 mEq/L (ref 3.5–5.1)
Sodium: 143 mEq/L (ref 135–145)

## 2017-07-12 LAB — HEMOGLOBIN A1C: HEMOGLOBIN A1C: 6.7 % — AB (ref 4.6–6.5)

## 2017-07-12 LAB — PROTIME-INR
INR: 1 ratio (ref 0.8–1.0)
PROTHROMBIN TIME: 11.2 s (ref 9.6–13.1)

## 2017-07-25 ENCOUNTER — Other Ambulatory Visit: Payer: Self-pay | Admitting: Dermatology

## 2017-07-31 ENCOUNTER — Other Ambulatory Visit: Payer: Self-pay | Admitting: Family Medicine

## 2017-08-04 ENCOUNTER — Encounter: Payer: Self-pay | Admitting: Family Medicine

## 2017-08-04 DIAGNOSIS — S86319A Strain of muscle(s) and tendon(s) of peroneal muscle group at lower leg level, unspecified leg, initial encounter: Secondary | ICD-10-CM | POA: Insufficient documentation

## 2017-08-16 HISTORY — PX: FOOT SURGERY: SHX648

## 2017-08-16 HISTORY — PX: TENDON REPAIR: SHX5111

## 2017-08-26 ENCOUNTER — Other Ambulatory Visit: Payer: Self-pay | Admitting: Family Medicine

## 2017-08-26 DIAGNOSIS — Z794 Long term (current) use of insulin: Principal | ICD-10-CM

## 2017-08-26 DIAGNOSIS — E118 Type 2 diabetes mellitus with unspecified complications: Secondary | ICD-10-CM

## 2017-09-11 ENCOUNTER — Other Ambulatory Visit: Payer: Self-pay | Admitting: Family Medicine

## 2017-09-11 ENCOUNTER — Other Ambulatory Visit: Payer: Self-pay | Admitting: Cardiovascular Disease

## 2017-09-26 ENCOUNTER — Other Ambulatory Visit: Payer: Self-pay | Admitting: Dermatology

## 2017-10-07 ENCOUNTER — Ambulatory Visit: Payer: Medicare Other

## 2017-10-07 ENCOUNTER — Ambulatory Visit (INDEPENDENT_AMBULATORY_CARE_PROVIDER_SITE_OTHER): Payer: Medicare Other

## 2017-10-07 VITALS — BP 122/74 | HR 94 | Temp 97.9°F | Ht 68.5 in | Wt 153.0 lb

## 2017-10-07 DIAGNOSIS — E785 Hyperlipidemia, unspecified: Secondary | ICD-10-CM

## 2017-10-07 DIAGNOSIS — Z0000 Encounter for general adult medical examination without abnormal findings: Secondary | ICD-10-CM

## 2017-10-07 DIAGNOSIS — E118 Type 2 diabetes mellitus with unspecified complications: Secondary | ICD-10-CM | POA: Diagnosis not present

## 2017-10-07 DIAGNOSIS — I10 Essential (primary) hypertension: Secondary | ICD-10-CM | POA: Diagnosis not present

## 2017-10-07 DIAGNOSIS — E559 Vitamin D deficiency, unspecified: Secondary | ICD-10-CM | POA: Diagnosis not present

## 2017-10-07 DIAGNOSIS — M8580 Other specified disorders of bone density and structure, unspecified site: Secondary | ICD-10-CM

## 2017-10-07 DIAGNOSIS — N138 Other obstructive and reflux uropathy: Secondary | ICD-10-CM | POA: Diagnosis not present

## 2017-10-07 DIAGNOSIS — N401 Enlarged prostate with lower urinary tract symptoms: Secondary | ICD-10-CM

## 2017-10-07 LAB — COMPREHENSIVE METABOLIC PANEL
ALBUMIN: 4.3 g/dL (ref 3.5–5.2)
ALT: 20 U/L (ref 0–53)
AST: 13 U/L (ref 0–37)
Alkaline Phosphatase: 68 U/L (ref 39–117)
BILIRUBIN TOTAL: 0.7 mg/dL (ref 0.2–1.2)
BUN: 14 mg/dL (ref 6–23)
CALCIUM: 10 mg/dL (ref 8.4–10.5)
CO2: 31 mEq/L (ref 19–32)
Chloride: 105 mEq/L (ref 96–112)
Creatinine, Ser: 0.6 mg/dL (ref 0.40–1.50)
GFR: 140.47 mL/min (ref >60.00)
Glucose, Bld: 109 mg/dL — ABNORMAL HIGH (ref 70–99)
Potassium: 4.9 mEq/L (ref 3.5–5.1)
Sodium: 143 mEq/L (ref 135–145)
TOTAL PROTEIN: 6.9 g/dL (ref 6.0–8.3)

## 2017-10-07 LAB — LIPID PANEL
CHOLESTEROL: 97 mg/dL (ref 0–200)
HDL: 40.4 mg/dL (ref >39.00)
LDL Cholesterol: 47 mg/dL (ref 0–99)
NonHDL: 56.4
TRIGLYCERIDES: 48 mg/dL (ref 0.0–149.0)
Total CHOL/HDL Ratio: 2
VLDL: 9.6 mg/dL (ref 0.0–40.0)

## 2017-10-07 LAB — PSA: PSA: 2.28 ng/mL (ref 0.10–4.00)

## 2017-10-07 LAB — VITAMIN D 25 HYDROXY (VIT D DEFICIENCY, FRACTURES): VITD: 41.27 ng/mL (ref 30.00–100.00)

## 2017-10-07 LAB — HEMOGLOBIN A1C: Hgb A1c MFr Bld: 6.6 % — ABNORMAL HIGH (ref 4.6–6.5)

## 2017-10-07 LAB — TSH: TSH: 0.92 u[IU]/mL (ref 0.35–4.50)

## 2017-10-07 NOTE — Patient Instructions (Signed)
Mr. Rape , Thank you for taking time to come for your Medicare Wellness Visit. I appreciate your ongoing commitment to your health goals. Please review the following plan we discussed and let me know if I can assist you in the future.   These are the goals we discussed: Goals    . DIET - INCREASE WATER INTAKE     Starting 10/07/2017, I will attempt to drink at least 6 glasses of water daily.        This is a list of the screening recommended for you and due dates:  Health Maintenance  Topic Date Due  . DTaP/Tdap/Td vaccine (1 - Tdap) 07/17/2019*  . Hemoglobin A1C  01/10/2018  . Complete foot exam   04/05/2018  . Eye exam for diabetics  05/01/2018  . Tetanus Vaccine  07/17/2019  . Colon Cancer Screening  10/29/2022  . Flu Shot  Completed  .  Hepatitis C: One time screening is recommended by Center for Disease Control  (CDC) for  adults born from [birth year] through 1966.   Completed  . Pneumonia vaccines  Completed  *Topic was postponed. The date shown is not the original due date.   Preventive Care for Adults  A healthy lifestyle and preventive care can promote health and wellness. Preventive health guidelines for adults include the following key practices.  . A routine yearly physical is a good way to check with your health care provider about your health and preventive screening. It is a chance to share any concerns and updates on your health and to receive a thorough exam.  . Visit your dentist for a routine exam and preventive care every 6 months. Brush your teeth twice a day and floss once a day. Good oral hygiene prevents tooth decay and gum disease.  . The frequency of eye exams is based on your age, health, family medical history, use  of contact lenses, and other factors. Follow your health care provider's recommendations for frequency of eye exams.  . Eat a healthy diet. Foods like vegetables, fruits, whole grains, low-fat dairy products, and lean protein foods contain the  nutrients you need without too many calories. Decrease your intake of foods high in solid fats, added sugars, and salt. Eat the right amount of calories for you. Get information about a proper diet from your health care provider, if necessary.  . Regular physical exercise is one of the most important things you can do for your health. Most adults should get at least 150 minutes of moderate-intensity exercise (any activity that increases your heart rate and causes you to sweat) each week. In addition, most adults need muscle-strengthening exercises on 2 or more days a week.  Silver Sneakers may be a benefit available to you. To determine eligibility, you may visit the website: www.silversneakers.com or contact program at (860)145-1537 Mon-Fri between 8AM-8PM.   . Maintain a healthy weight. The body mass index (BMI) is a screening tool to identify possible weight problems. It provides an estimate of body fat based on height and weight. Your health care provider can find your BMI and can help you achieve or maintain a healthy weight.   For adults 20 years and older: ? A BMI below 18.5 is considered underweight. ? A BMI of 18.5 to 24.9 is normal. ? A BMI of 25 to 29.9 is considered overweight. ? A BMI of 30 and above is considered obese.   . Maintain normal blood lipids and cholesterol levels by exercising and minimizing your  intake of saturated fat. Eat a balanced diet with plenty of fruit and vegetables. Blood tests for lipids and cholesterol should begin at age 86 and be repeated every 5 years. If your lipid or cholesterol levels are high, you are over 50, or you are at high risk for heart disease, you may need your cholesterol levels checked more frequently. Ongoing high lipid and cholesterol levels should be treated with medicines if diet and exercise are not working.  . If you smoke, find out from your health care provider how to quit. If you do not use tobacco, please do not start.  . If you  choose to drink alcohol, please do not consume more than 2 drinks per day. One drink is considered to be 12 ounces (355 mL) of beer, 5 ounces (148 mL) of wine, or 1.5 ounces (44 mL) of liquor.  . If you are 34-84 years old, ask your health care provider if you should take aspirin to prevent strokes.  . Use sunscreen. Apply sunscreen liberally and repeatedly throughout the day. You should seek shade when your shadow is shorter than you. Protect yourself by wearing long sleeves, pants, a wide-brimmed hat, and sunglasses year round, whenever you are outdoors.  . Once a month, do a whole body skin exam, using a mirror to look at the skin on your back. Tell your health care provider of new moles, moles that have irregular borders, moles that are larger than a pencil eraser, or moles that have changed in shape or color.

## 2017-10-07 NOTE — Progress Notes (Signed)
Subjective:   Norman Marks is a 72 y.o. male who presents for Medicare Annual/Subsequent preventive examination.  Review of Systems:  N/A Cardiac Risk Factors include: advanced age (>1men, >71 women);diabetes mellitus;male gender;dyslipidemia;hypertension     Objective:    Vitals: BP 122/74 (BP Location: Right Arm, Patient Position: Sitting, Cuff Size: Normal)   Pulse 94   Temp 97.9 F (36.6 C) (Oral)   Ht 5' 8.5" (1.74 m) Comment: shoes  Wt 153 lb (69.4 kg)   SpO2 95%   BMI 22.93 kg/m   Body mass index is 22.93 kg/m.  Advanced Directives 10/07/2017 02/15/2016 10/03/2015 01/26/2014 01/18/2014  Does Patient Have a Medical Advance Directive? No No No Patient does not have advance directive Patient does not have advance directive;Patient would not like information  Would patient like information on creating a medical advance directive? No - Patient declined No - patient declined information No - patient declined information - -  Pre-existing out of facility DNR order (yellow form or pink MOST form) - - - - No    Tobacco Social History   Tobacco Use  Smoking Status Former Smoker  . Packs/day: 0.25  . Years: 10.00  . Pack years: 2.50  . Types: Cigarettes  . Last attempt to quit: 10/20/1992  . Years since quitting: 24.9  Smokeless Tobacco Former Systems developer  . Types: Chew  . Quit date: 10/20/1992     Counseling given: No   Clinical Intake:  Pre-visit preparation completed: Yes  Pain Score: 5      Nutritional Status: BMI of 19-24  Normal Nutritional Risks: Unintentional weight loss Diabetes: Yes CBG done?: No Did pt. bring in CBG monitor from home?: No  How often do you need to have someone help you when you read instructions, pamphlets, or other written materials from your doctor or pharmacy?: 1 - Never What is the last grade level you completed in school?: 12th grade  Interpreter Needed?: No  Comments: pt lives with spouse Information entered by :: LPinson,  LPN  Past Medical History:  Diagnosis Date  . BPH (benign prostatic hypertrophy)   . CAD (coronary artery disease) 2009   [name] (cardiolite 2009 with 70% stenosis of proximal ramus, not amenable to PCI, rec treat medically) --> [name]  . Cancer ([place])    basal cell skin cancer  . DDD (degenerative disc disease), lumbar    s/p surgery  . Diabetes type 2, uncontrolled ([place]) 1990s   referred to [facility] 09/2015  . Dysrhythmia   . Headache(784.0)   . Heart murmur   . History of adenomatous polyp of colon 10/2012  . History of bundle branch block   . History of chicken pox   . History of nephrolithiasis 2012   L ureter, R renal pelvis  . HLD (hyperlipidemia)   . HTN (hypertension)    mild  . Hx of migraines   . Osteoarthritis of [place] joint of thumb 2014   s/p injections ([place])  . Osteoarthritis of right shoulder 2015   s/p injections ([place])  . Osteopenia 06/2011   T score -3, completed 2 yrs forteo, T score improved to -2.2 (2015)  . PONV (postoperative nausea and vomiting)   . Vitamin D deficiency    Past Surgical History:  Procedure Laterality Date  . [place], 2008   lumbar; x 3, spinal fusion  . CARDIAC CATHETERIZATION  2009   70% stenosis ostium  . CARDIOVASCULAR STRESS TEST  2011   fixed defect inferior/septal  walls without reversibility, LVEF 43% post stress  . COLONOSCOPY  10/2012   tubular adenoma, mild diverticulosis, rpt 5 yrs Deatra Ina)  . dexa  06/2011   osteoporosis T -2.6 spine, -1.9 hip  . dexa  07/2013   osteopenia T -2.2 femur, -1.2 spine  . FLEXIBLE SIGMOIDOSCOPY  2007   pt reported normal ([place])  . FOOT SURGERY Left 08/2017  . HAND SURGERY Left 10/2015   [place] arthritis repair [place] Silos)  . KNEE SURGERY Right   . SHOULDER SURGERY Right   . SKIN SURGERY  2006   skin cancer  . TENDON REPAIR  08/2017  . TOTAL SHOULDER ARTHROPLASTY Right 01/26/2014   Procedure: TOTAL SHOULDER ARTHROPLASTY;  Surgeon: Johnny Bridge, MD;  Location: [place];   Service: Orthopedics;  Laterality: Right;  . US ECHOCARDIOGRAPHY  10/2008   nl LV fxn, EF 50%, mild MR, mild pulm HTN   Family History  Problem Relation Age of Onset  . Cancer Father        lung and prostate  . Diabetes Father   . Heart disease Mother   . Cancer Mother        unsure  . Coronary artery disease Sister 72  . Heart attack Sister 73  . Diabetes Brother   . Stroke Neg Hx   . Colon cancer Neg Hx    Social History   Socioeconomic History  . Marital status: Married    Spouse name: Not on file  . Number of children: Not on file  . Years of education: Not on file  . Highest education level: Not on file  Occupational History  . Not on file  Social Needs  . Financial resource strain: Not on file  . Food insecurity:    Worry: Not on file    Inability: Not on file  . Transportation needs:    Medical: Not on file    Non-medical: Not on file  Tobacco Use  . Smoking status: Former Smoker    Packs/day: 0.25    Years: 10.00    Pack years: 2.50    Types: Cigarettes    Last attempt to quit: 10/20/1992    Years since quitting: 24.9  . Smokeless tobacco: Former Systems developer    Types: [place] date: 10/20/1992  Substance and Sexual Activity  . Alcohol use: No    Alcohol/week: 0.0 oz  . Drug use: No  . Sexual activity: Not on file  Lifestyle  . Physical activity:    Days per week: Not on file    Minutes per session: Not on file  . Stress: Not on file  Relationships  . Social connections:    Talks on phone: Not on file    Gets together: Not on file    Attends religious service: Not on file    Active member of club or organization: Not on file    Attends meetings of clubs or organizations: Not on file    Relationship status: Not on file  Other Topics Concern  . Not on file  Social History Narrative   Caffeine: 1 cup coffee/day   Lives with wife, 1 dog, 5 cats.   Occupation: retired   Edu: 12th grade   Activity: no regular exercise   Diet: good water,  fruits/vegetables daily    Outpatient Encounter Medications as of 10/07/2017  Medication Sig  . aspirin 81 MG tablet Take 81 mg by mouth daily.  . BD INSULIN SYRINGE U/F 31G X 5/16"  1 ML MISC USE TO INJECT INSULIN AS DIRECTED  . carvedilol (COREG) 25 MG tablet TAKE 1 TABLET BY MOUTH TWICE (2) DAILY WITH A MEAL  . Cholecalciferol (VITAMIN D) 2000 units CAPS Take 1 capsule by mouth daily.  . cyclobenzaprine (FLEXERIL) 5 MG tablet Take 0.5-1 tablets (2.5-5 mg total) by mouth 2 (two) times daily. Sedation precautions  . Insulin Pen Needle (EXEL PEN NEEDLES 31GX1/4") 31G X 6 MM MISC Use as directed  . JANUMET XR [phone] MG TB24 TAKE 1 TABLET BY MOUTH ONCE DAILY  . LANTUS 100 UNIT/ML injection INJECT 0.4 ML (40 UNITS TOTAL) INTO THE SKIN AT BEDTIME  . lovastatin (MEVACOR) 40 MG tablet TAKE 1 TABLET BY MOUTH AT BEDTIME  . ONE TOUCH ULTRA TEST test strip CHECK BLOOD SUGAR ONCE OR TWICE DAILY ASDIRECTED  . ramipril (ALTACE) 10 MG capsule TAKE 1 CAPSULE BY MOUTH ONCE DAILY   No facility-administered encounter medications on file as of 10/07/2017.     Activities of Daily Living In your present state of health, do you have any difficulty performing the following activities: 10/07/2017  Hearing? Y  Vision? N  Difficulty concentrating or making decisions? N  Walking or climbing stairs? Y  Dressing or bathing? N  Doing errands, shopping? N  Preparing Food and eating ? N  Using the Toilet? N  In the past six months, have you accidently leaked urine? N  Do you have problems with loss of bowel control? N  Managing your Medications? N  Managing your Finances? N  Housekeeping or managing your Housekeeping? N  Some recent data might be hidden    Patient Care Team: Ria Bush, MD as PCP - General (Family Medicine) Wellington Hampshire, MD as Consulting Physician (Cardiology)   Assessment:   This is a routine wellness examination for [facility].  Exercise Activities and Dietary  recommendations Current Exercise Habits: The patient does not participate in regular exercise at present, Exercise limited by: None identified  Goals    . DIET - INCREASE WATER INTAKE     Starting 10/07/2017, I will attempt to drink at least 6 glasses of water daily.        Fall Risk Fall Risk  10/07/2017 10/03/2016 10/20/2015 10/13/2015 10/06/2015  Falls in the past year? No No (No Data) No (No Data)  Comment - - no falls in past week - no falls since previous visit, 10/03/15   Depression Screen PHQ 2/9 Scores 10/07/2017 10/03/2016 10/03/2015 09/22/2015  PHQ - 2 Score 0 0 0 0  PHQ- 9 Score 0 - - -    Cognitive Function MMSE - Mini Mental State Exam 10/07/2017  Orientation to time 5  Orientation to Place 5  Registration 3  Attention/ Calculation 0  Recall 3  Language- name 2 objects 0  Language- repeat 1  Language- follow 3 step command 3  Language- read & follow direction 0  Write a sentence 0  Copy design 0  Total score 20        Immunization History  Administered Date(s) Administered  . Influenza Whole 04/15/2013  . Influenza,inj,Quad PF,6+ Mos 04/21/2014, 03/23/2015, 04/23/2016, 04/05/2017  . Pneumococcal Conjugate-13 09/16/2013  . Pneumococcal Polysaccharide-23 06/28/2010  . Td 07/16/2009  . Zoster 01/13/2013   Screening Tests Health Maintenance  Topic Date Due  . DTaP/Tdap/Td (1 - Tdap) 07/17/2019 (Originally 07/17/2009)  . HEMOGLOBIN A1C  01/10/2018  . FOOT EXAM  04/05/2018  . OPHTHALMOLOGY EXAM  05/01/2018  . TETANUS/TDAP  07/17/2019  . COLONOSCOPY  10/29/2022  . INFLUENZA VACCINE  Completed  . Hepatitis C Screening  Completed  . PNA vac Low Risk Adult  Completed     Plan:     I have personally reviewed, addressed, and noted the following in the patient's chart:  A. Medical and social history B. Use of alcohol, tobacco or illicit drugs  C. Current medications and supplements D. Functional ability and status E.  Nutritional status F.  Physical  activity G. Advance directives H. List of other physicians I.  Hospitalizations, surgeries, and ER visits in previous 12 months J.  [place] to include hearing, vision, cognitive, depression L. Referrals and appointments - none  In addition, I have reviewed and discussed with patient certain preventive protocols, quality metrics, and best practice recommendations. A written personalized care plan for preventive services as well as general preventive health recommendations were provided to patient.  See attached scanned questionnaire for additional information.   Signed,   Lindell Noe, MHA, BS, LPN Health Coach

## 2017-10-07 NOTE — Progress Notes (Signed)
PCP notes:   Health maintenance:  No gaps identified.  Abnormal screenings:   No gaps identified.   Patient concerns:   Right foot pain. Pain scale: 5/10 Decreased appetite. Decreased sleep due to foot pain. Unintentional weight loss.   Nurse concerns:  None  Next PCP appt:   10/11/17 @ 0830

## 2017-10-11 ENCOUNTER — Telehealth: Payer: Self-pay | Admitting: Internal Medicine

## 2017-10-11 ENCOUNTER — Ambulatory Visit (INDEPENDENT_AMBULATORY_CARE_PROVIDER_SITE_OTHER): Payer: Medicare Other | Admitting: Family Medicine

## 2017-10-11 ENCOUNTER — Encounter: Payer: Self-pay | Admitting: Internal Medicine

## 2017-10-11 ENCOUNTER — Encounter: Payer: Self-pay | Admitting: Family Medicine

## 2017-10-11 VITALS — BP 120/70 | HR 84 | Temp 97.9°F | Ht 69.0 in | Wt 153.0 lb

## 2017-10-11 DIAGNOSIS — Z0000 Encounter for general adult medical examination without abnormal findings: Secondary | ICD-10-CM | POA: Diagnosis not present

## 2017-10-11 DIAGNOSIS — S86312D Strain of muscle(s) and tendon(s) of peroneal muscle group at lower leg level, left leg, subsequent encounter: Secondary | ICD-10-CM

## 2017-10-11 DIAGNOSIS — N401 Enlarged prostate with lower urinary tract symptoms: Secondary | ICD-10-CM | POA: Diagnosis not present

## 2017-10-11 DIAGNOSIS — Z1211 Encounter for screening for malignant neoplasm of colon: Secondary | ICD-10-CM

## 2017-10-11 DIAGNOSIS — Z7189 Other specified counseling: Secondary | ICD-10-CM | POA: Diagnosis not present

## 2017-10-11 DIAGNOSIS — I10 Essential (primary) hypertension: Secondary | ICD-10-CM

## 2017-10-11 DIAGNOSIS — E785 Hyperlipidemia, unspecified: Secondary | ICD-10-CM

## 2017-10-11 DIAGNOSIS — Z794 Long term (current) use of insulin: Secondary | ICD-10-CM | POA: Diagnosis not present

## 2017-10-11 DIAGNOSIS — E118 Type 2 diabetes mellitus with unspecified complications: Secondary | ICD-10-CM

## 2017-10-11 DIAGNOSIS — N138 Other obstructive and reflux uropathy: Secondary | ICD-10-CM | POA: Diagnosis not present

## 2017-10-11 DIAGNOSIS — I2510 Atherosclerotic heart disease of native coronary artery without angina pectoris: Secondary | ICD-10-CM | POA: Diagnosis not present

## 2017-10-11 NOTE — Assessment & Plan Note (Signed)
Chronic, stable. Continue current regimen. 

## 2017-10-11 NOTE — Addendum Note (Signed)
Addended by: Ria Bush on: 10/11/2017 09:49 AM   Modules accepted: Orders

## 2017-10-11 NOTE — Assessment & Plan Note (Addendum)
Worsening symptoms noted. Discussed conservative measures (ie limiting fluids at bedtime). Discussed flomax vs finasteride. Will defer medication at this time, will reassess at next visit and likely trial flomax. Previously on rapaflo.

## 2017-10-11 NOTE — Progress Notes (Addendum)
BP 120/70 (BP Location: Left Arm, Patient Position: Sitting, Cuff Size: Normal)   Pulse 84   Temp 97.9 F (36.6 C) (Oral)   Ht 5\' 9"  (1.753 m)   Wt 153 lb (69.4 kg)   SpO2 97%   BMI 22.59 kg/m    CC: CPE Subjective:    Patient ID: Norman Marks, male    DOB: [birth year], 72 y.o.   MRN: [id number]  HPI: GEE HABIG is a 72 y.o. male presenting on 10/11/2017 for Annual Exam (Pt 2. Pt accompanied by wife. )   Annia Belt Monday for medicare wellness visit. Note reviewed.  Recent L ankle surgery Doran Durand), recovering well. 13 lb weight loss. Appetite down.   Preventative: COLONOSCOPY Date: 10/2012 tubular adenoma, mild diverticulosis, rpt 5 yrs Deatra Ina) Prostate cancer screening - yearly. Endorses 3-4 nocturia, weakening, urgency.  DEXA date: 07/2013 osteopenia T -2.2 femur, -1.2 spine Flu yearly penumovax 06/28/2010. prevnar 09/2013 Tetanus around 2011  Zostavax - 01/2013  shingrix - discussed Advanced directive: doesn't have one set up. Has talked about this with wife.Wife would be HCPOA.Does not want drastic measures. Has paperwork at home.  Seat belt use discussed Sunscreen use discussed. No changing moles on skin. Sees derm regularly. Planned Mohs to R temple.  Ex smoker - quit 1994 Alcohol - none  Caffeine: 1 cup coffee/day  Lives with wife, 1 dog, 5 cats.  Occupation: retired  Edu: 12th grade  Activity: no regular exercise.  Diet: good water, fruits/vegetables daily   Relevant past medical, surgical, family and social history reviewed and updated as indicated. Interim medical history since our last visit reviewed. Allergies and medications reviewed and updated. Outpatient Medications Prior to Visit  Medication Sig Dispense Refill  . aspirin 81 MG tablet Take 81 mg by mouth daily.    . BD INSULIN SYRINGE U/F 31G X 5/16" 1 ML MISC USE TO INJECT INSULIN AS DIRECTED 90 each 3  . carvedilol (COREG) 25 MG tablet TAKE 1 TABLET BY MOUTH TWICE (2) DAILY WITH A MEAL 60  tablet 6  . Cholecalciferol (VITAMIN D) 2000 units CAPS Take 1 capsule by mouth daily.    . cyclobenzaprine (FLEXERIL) 5 MG tablet Take 0.5-1 tablets (2.5-5 mg total) by mouth 2 (two) times daily. Sedation precautions 30 tablet 0  . Insulin Pen Needle (EXEL PEN NEEDLES 31GX1/4") 31G X 6 MM MISC Use as directed 100 each 3  . JANUMET XR [phone] MG TB24 TAKE 1 TABLET BY MOUTH ONCE DAILY 90 tablet 1  . LANTUS 100 UNIT/ML injection INJECT 0.4 ML (40 UNITS TOTAL) INTO THE SKIN AT BEDTIME 10 mL 5  . lovastatin (MEVACOR) 40 MG tablet TAKE 1 TABLET BY MOUTH AT BEDTIME 90 tablet 0  . ONE TOUCH ULTRA TEST test strip CHECK BLOOD SUGAR ONCE OR TWICE DAILY ASDIRECTED 100 each 3  . ramipril (ALTACE) 10 MG capsule TAKE 1 CAPSULE BY MOUTH ONCE DAILY 90 capsule 1   No facility-administered medications prior to visit.      Per HPI unless specifically indicated in ROS section below Review of Systems  Constitutional: Negative for activity change, appetite change, chills, fatigue, fever and unexpected weight change.  HENT: Negative for hearing loss.   Eyes: Negative for visual disturbance.  Respiratory: Negative for cough, chest tightness, shortness of breath and wheezing.   Cardiovascular: Positive for leg swelling (L foot). Negative for chest pain and palpitations.  Gastrointestinal: Positive for constipation (mild, on stool softener). Negative for abdominal distention, abdominal pain, blood  in stool, diarrhea, nausea and vomiting.  Genitourinary: Negative for difficulty urinating and hematuria.  Musculoskeletal: Negative for arthralgias, myalgias and neck pain.  Skin: Negative for rash.  Neurological: Negative for dizziness, seizures, syncope and headaches.  Hematological: Negative for adenopathy. Does not bruise/bleed easily.  Psychiatric/Behavioral: Negative for dysphoric mood. The patient is not nervous/anxious.        Objective:    BP 120/70 (BP Location: Left Arm, Patient Position: Sitting, Cuff  Size: Normal)   Pulse 84   Temp 97.9 F (36.6 C) (Oral)   Ht 5\' 9"  (1.753 m)   Wt 153 lb (69.4 kg)   SpO2 97%   BMI 22.59 kg/m   Wt Readings from Last 3 Encounters:  10/11/17 153 lb (69.4 kg)  10/07/17 153 lb (69.4 kg)  06/13/17 166 lb (75.3 kg)    Physical Exam  Constitutional: He is oriented to person, place, and time. He appears well-developed and well-nourished. No distress.  HENT:  Head: Normocephalic and atraumatic.  Right Ear: Hearing, tympanic membrane, external ear and ear canal normal.  Left Ear: Hearing, tympanic membrane, external ear and ear canal normal.  Nose: Nose normal.  Mouth/Throat: Uvula is midline, oropharynx is clear and moist and mucous membranes are normal. No oropharyngeal exudate, posterior oropharyngeal edema or posterior oropharyngeal erythema.  Eyes: Pupils are equal, round, and reactive to light. Conjunctivae and EOM are normal. No scleral icterus.  Neck: Normal range of motion. Neck supple. Carotid bruit is not present. No thyromegaly present.  Cardiovascular: Normal rate, regular rhythm, normal heart sounds and intact distal pulses.  No murmur heard. Pulses:      Radial pulses are 2+ on the right side, and 2+ on the left side.  Pulmonary/Chest: Effort normal and breath sounds normal. No respiratory distress. He has no wheezes. He has no rales.  Abdominal: Soft. Bowel sounds are normal. He exhibits no distension and no mass. There is no tenderness. There is no rebound and no guarding.  Genitourinary: Rectum normal. Rectal exam shows no external hemorrhoid, no internal hemorrhoid, no fissure, no mass, no tenderness and anal tone normal. Prostate is enlarged (40gm). Prostate is not tender.  Musculoskeletal: Normal range of motion. He exhibits no edema.  Lymphadenopathy:    He has no cervical adenopathy.  Neurological: He is alert and oriented to person, place, and time.  CN grossly intact, station and gait intact  Skin: Skin is warm and dry. No rash  noted.  Psychiatric: He has a normal mood and affect. His behavior is normal. Judgment and thought content normal.  Nursing note and vitals reviewed.  Results for orders placed or performed in visit on 10/07/17  TSH  Result Value Ref Range   TSH 0.92 0.35 - 4.50 uIU/mL  PSA  Result Value Ref Range   PSA 2.28 0.10 - 4.00 ng/mL  Vitamin D, 25-hydroxy  Result Value Ref Range   VITD 41.27 30.00 - 100.00 ng/mL  Hemoglobin A1c  Result Value Ref Range   Hgb A1c MFr Bld 6.6 (H) 4.6 - 6.5 %  Comprehensive metabolic panel  Result Value Ref Range   Sodium 143 135 - 145 mEq/L   Potassium 4.9 3.5 - 5.1 mEq/L   Chloride 105 96 - 112 mEq/L   CO2 31 19 - 32 mEq/L   Glucose, Bld 109 (H) 70 - 99 mg/dL   BUN 14 6 - 23 mg/dL   Creatinine, Ser 0.60 0.40 - 1.50 mg/dL   Total Bilirubin 0.7 0.2 - 1.2 mg/dL  Alkaline Phosphatase 68 39 - 117 U/L   AST 13 0 - 37 U/L   ALT 20 0 - 53 U/L   Total Protein 6.9 6.0 - 8.3 g/dL   Albumin 4.3 3.5 - 5.2 g/dL   Calcium 10.0 8.4 - 10.5 mg/dL   GFR 140.47 >60.00 mL/min  Lipid Panel  Result Value Ref Range   Cholesterol 97 0 - 200 mg/dL   Triglycerides 48.0 0.0 - 149.0 mg/dL   HDL 40.40 >39.00 mg/dL   VLDL 9.6 0.0 - 40.0 mg/dL   LDL Cholesterol 47 0 - 99 mg/dL   Total CHOL/HDL Ratio 2    NonHDL 56.40       Assessment & Plan:   Problem List Items Addressed This Visit    Advanced care planning/counseling discussion    Advanced directive: doesn't have one set up. Has talked about this with wife.Wife would be HCPOA.Does not want drastic measures. Has paperwork at home.       BPH with obstruction/lower urinary tract symptoms    Worsening symptoms noted. Discussed conservative measures (ie limiting fluids at bedtime). Discussed flomax vs finasteride. Will defer medication at this time, will reassess at next visit and likely trial flomax. Previously on rapaflo.       CAD (coronary artery disease)    Followed by cards Dr Fletcher Anon. Asxs. On aspirin, statin.         Diabetes mellitus type 2, controlled, with complications (HCC)    Chronic, stable. Continue current regimen.       Health maintenance examination - Primary    Preventative protocols reviewed and updated unless pt declined. Discussed healthy diet and lifestyle.       HLD (hyperlipidemia)    Chronic, stable continue current regimen. The ASCVD Risk score Mikey Bussing DC Jr., et al., 2013) failed to calculate for the following reasons:   The valid total cholesterol range is 130 to 320 mg/dL       HTN (hypertension)    Chronic, stable. Continue current regimen       Peroneal tendon tear    S/p recent repair.        Other Visit Diagnoses    Special screening for malignant neoplasms, colon       Relevant Orders   Ambulatory referral to Gastroenterology       No orders of the defined types were placed in this encounter.  Orders Placed This Encounter  Procedures  . Ambulatory referral to Gastroenterology    Referral Priority:   Routine    Referral Type:   Consultation    Referral Reason:   Specialty Services Required    Number of Visits Requested:   1    Follow up plan: Return in about 1 year (around 10/12/2018) for follow up visit.  Ria Bush, MD

## 2017-10-11 NOTE — Telephone Encounter (Signed)
Call from pt PCP at [facility] for patient to have repeat colon. Recall in system for 2024 but per result note sent in 2014, pt should have repeat in 5 years. Former Dr.Kaplan pt. DOD this morning 3.29.19 Dr.Pyrtle. Please advise.

## 2017-10-11 NOTE — Patient Instructions (Addendum)
We will refer you back to GI for repeat colonoscopy.  If interested, check with pharmacy about new 2 shot shingles series (shingrix).  Let's watch prostate symptoms for now, discuss next visit. Let me know sooner if worsening symptoms.  Return as needed or in 6 months for diabetes follow up visit.   Health Maintenance, Male A healthy lifestyle and preventive care is important for your health and wellness. Ask your health care provider about what schedule of regular examinations is right for you. What should I know about weight and diet? Eat a Healthy Diet  Eat plenty of vegetables, fruits, whole grains, low-fat dairy products, and lean protein.  Do not eat a lot of foods high in solid fats, added sugars, or salt.  Maintain a Healthy Weight Regular exercise can help you achieve or maintain a healthy weight. You should:  Do at least 150 minutes of exercise each week. The exercise should increase your heart rate and make you sweat (moderate-intensity exercise).  Do strength-training exercises at least twice a week.  Watch Your Levels of Cholesterol and Blood Lipids  Have your blood tested for lipids and cholesterol every 5 years starting at 73 years of age. If you are at high risk for heart disease, you should start having your blood tested when you are 73 years old. You may need to have your cholesterol levels checked more often if: ? Your lipid or cholesterol levels are high. ? You are older than 73 years of age. ? You are at high risk for heart disease.  What should I know about cancer screening? Many types of cancers can be detected early and may often be prevented. Lung Cancer  You should be screened every year for lung cancer if: ? You are a current smoker who has smoked for at least 30 years. ? You are a former smoker who has quit within the past 15 years.  Talk to your health care provider about your screening options, when you should start screening, and how often you should  be screened.  Colorectal Cancer  Routine colorectal cancer screening usually begins at 73 years of age and should be repeated every 5-10 years until you are 73 years old. You may need to be screened more often if early forms of precancerous polyps or small growths are found. Your health care provider may recommend screening at an earlier age if you have risk factors for colon cancer.  Your health care provider may recommend using home test kits to check for hidden blood in the stool.  A small camera at the end of a tube can be used to examine your colon (sigmoidoscopy or colonoscopy). This checks for the earliest forms of colorectal cancer.  Prostate and Testicular Cancer  Depending on your age and overall health, your health care provider may do certain tests to screen for prostate and testicular cancer.  Talk to your health care provider about any symptoms or concerns you have about testicular or prostate cancer.  Skin Cancer  Check your skin from head to toe regularly.  Tell your health care provider about any new moles or changes in moles, especially if: ? There is a change in a mole's size, shape, or color. ? You have a mole that is larger than a pencil eraser.  Always use sunscreen. Apply sunscreen liberally and repeat throughout the day.  Protect yourself by wearing long sleeves, pants, a wide-brimmed hat, and sunglasses when outside.  What should I know about heart disease, diabetes,  and high blood pressure?  If you are 56-32 years of age, have your blood pressure checked every 3-5 years. If you are 14 years of age or older, have your blood pressure checked every year. You should have your blood pressure measured twice-once when you are at a hospital or clinic, and once when you are not at a hospital or clinic. Record the average of the two measurements. To check your blood pressure when you are not at a hospital or clinic, you can use: ? An automated blood pressure machine at  a pharmacy. ? A home blood pressure monitor.  Talk to your health care provider about your target blood pressure.  If you are between 3-68 years old, ask your health care provider if you should take aspirin to prevent heart disease.  Have regular diabetes screenings by checking your fasting blood sugar level. ? If you are at a normal weight and have a low risk for diabetes, have this test once every three years after the age of 1. ? If you are overweight and have a high risk for diabetes, consider being tested at a younger age or more often.  A one-time screening for abdominal aortic aneurysm (AAA) by ultrasound is recommended for men aged 61-75 years who are current or former smokers. What should I know about preventing infection? Hepatitis B If you have a higher risk for hepatitis B, you should be screened for this virus. Talk with your health care provider to find out if you are at risk for hepatitis B infection. Hepatitis C Blood testing is recommended for:  Everyone born from [birth year] through 1966.  Anyone with known risk factors for hepatitis C.  Sexually Transmitted Diseases (STDs)  You should be screened each year for STDs including gonorrhea and chlamydia if: ? You are sexually active and are younger than 73 years of age. ? You are older than 73 years of age and your health care provider tells you that you are at risk for this type of infection. ? Your sexual activity has changed since you were last screened and you are at an increased risk for chlamydia or gonorrhea. Ask your health care provider if you are at risk.  Talk with your health care provider about whether you are at high risk of being infected with HIV. Your health care provider may recommend a prescription medicine to help prevent HIV infection.  What else can I do?  Schedule regular health, dental, and eye exams.  Stay current with your vaccines (immunizations).  Do not use any tobacco products, such as  cigarettes, chewing tobacco, and e-cigarettes. If you need help quitting, ask your health care provider.  Limit alcohol intake to no more than 2 drinks per day. One drink equals 12 ounces of beer, 5 ounces of wine, or 1 ounces of hard liquor.  Do not use street drugs.  Do not share needles.  Ask your health care provider for help if you need support or information about quitting drugs.  Tell your health care provider if you often feel depressed.  Tell your health care provider if you have ever been abused or do not feel safe at home. This information is not intended to replace advice given to you by your health care provider. Make sure you discuss any questions you have with your health care provider. Document Released: 12/29/2007 Document Revised: 02/29/2016 Document Reviewed: 04/05/2015 Elsevier Interactive Patient Education  [employer].

## 2017-10-11 NOTE — Assessment & Plan Note (Signed)
Followed by cards Dr Fletcher Anon. Asxs. On aspirin, statin.

## 2017-10-11 NOTE — Telephone Encounter (Signed)
Pt is due for colon in April. Was a 5 year recall due to adenomatous polyp.

## 2017-10-11 NOTE — Assessment & Plan Note (Signed)
Chronic, stable continue current regimen. The ASCVD Risk score Norman Bussing DC Jr., Norman al., 2013) failed to calculate for the following reasons:   The valid total cholesterol range is 130 to 320 mg/dL

## 2017-10-11 NOTE — Assessment & Plan Note (Signed)
Advanced directive: doesn't have one set up. Has talked about this with wife.Wife would be HCPOA.Does not want drastic measures. Has paperwork at home.

## 2017-10-11 NOTE — Assessment & Plan Note (Signed)
Preventative protocols reviewed and updated unless pt declined. Discussed healthy diet and lifestyle.  

## 2017-10-11 NOTE — Assessment & Plan Note (Signed)
S/p recent repair.

## 2017-10-15 NOTE — Progress Notes (Signed)
I reviewed health advisor's note, was available for consultation, and agree with documentation and plan.  

## 2017-10-21 ENCOUNTER — Other Ambulatory Visit: Payer: Self-pay | Admitting: Family Medicine

## 2017-11-18 ENCOUNTER — Other Ambulatory Visit: Payer: Self-pay | Admitting: Family Medicine

## 2017-12-04 ENCOUNTER — Other Ambulatory Visit: Payer: Self-pay

## 2017-12-04 ENCOUNTER — Ambulatory Visit (AMBULATORY_SURGERY_CENTER): Payer: Self-pay | Admitting: *Deleted

## 2017-12-04 VITALS — Ht 68.5 in | Wt 164.0 lb

## 2017-12-04 DIAGNOSIS — Z86010 Personal history of colonic polyps: Secondary | ICD-10-CM

## 2017-12-04 MED ORDER — NA SULFATE-K SULFATE-MG SULF 17.5-3.13-1.6 GM/177ML PO SOLN: 1.0000 | Freq: Once | ORAL | 0 refills | Status: AC

## 2017-12-04 NOTE — Progress Notes (Signed)
No egg or soy allergy known to patient   issues with past sedation with any surgeries or procedures of PONV , no intubation problems  No diet pills per patient No home 02 use per patient  No blood thinners per patient  Pt denies issues with constipation  No A fib or A flutter  EMMI video sent to pt's e mail - pt does not use his e mail

## 2017-12-10 ENCOUNTER — Encounter: Payer: Self-pay | Admitting: Internal Medicine

## 2017-12-14 HISTORY — PX: COLONOSCOPY: SHX174

## 2017-12-24 ENCOUNTER — Ambulatory Visit (AMBULATORY_SURGERY_CENTER): Payer: Medicare Other | Admitting: Internal Medicine

## 2017-12-24 ENCOUNTER — Encounter: Payer: Self-pay | Admitting: Internal Medicine

## 2017-12-24 ENCOUNTER — Other Ambulatory Visit: Payer: Self-pay

## 2017-12-24 VITALS — BP 125/70 | HR 78 | Temp 97.3°F | Resp 14 | Ht 69.0 in | Wt 153.0 lb

## 2017-12-24 DIAGNOSIS — D123 Benign neoplasm of transverse colon: Secondary | ICD-10-CM

## 2017-12-24 DIAGNOSIS — Z86010 Personal history of colonic polyps: Secondary | ICD-10-CM

## 2017-12-24 DIAGNOSIS — D122 Benign neoplasm of ascending colon: Secondary | ICD-10-CM | POA: Diagnosis not present

## 2017-12-24 MED ORDER — SODIUM CHLORIDE 0.9 % IV SOLN: 500.0000 mL | Freq: Once | INTRAVENOUS | Status: DC

## 2017-12-24 NOTE — Patient Instructions (Signed)
HANDOUTS GIVEN FOR POLYPS AND DIVERTICULOSIS  YOU HAD AN ENDOSCOPIC PROCEDURE TODAY AT THE [facility]:   Refer to the procedure report that was given to you for any specific questions about what was found during the examination.  If the procedure report does not answer your questions, please call your gastroenterologist to clarify.  If you requested that your care partner not be given the details of your procedure findings, then the procedure report has been included in a sealed envelope for you to review at your convenience later.  YOU SHOULD EXPECT: Some feelings of bloating in the abdomen. Passage of more gas than usual.  Walking can help get rid of the air that was put into your GI tract during the procedure and reduce the bloating. If you had a lower endoscopy (such as a colonoscopy or flexible sigmoidoscopy) you may notice spotting of blood in your stool or on the toilet paper. If you underwent a bowel prep for your procedure, you may not have a normal bowel movement for a few days.  Please Note:  You might notice some irritation and congestion in your nose or some drainage.  This is from the oxygen used during your procedure.  There is no need for concern and it should clear up in a day or so.  SYMPTOMS TO REPORT IMMEDIATELY:   Following lower endoscopy (colonoscopy or flexible sigmoidoscopy):  Excessive amounts of blood in the stool  Significant tenderness or worsening of abdominal pains  Swelling of the abdomen that is new, acute  Fever of 100F or higher   For urgent or emergent issues, a gastroenterologist can be reached at any hour by calling [phone].   DIET:  We do recommend a small meal at first, but then you may proceed to your regular diet.  Drink plenty of fluids but you should avoid alcoholic beverages for 24 hours.  ACTIVITY:  You should plan to take it easy for the rest of today and you should NOT DRIVE or use heavy machinery until tomorrow (because of  the sedation medicines used during the test).    FOLLOW UP: Our staff will call the number listed on your records the next business day following your procedure to check on you and address any questions or concerns that you may have regarding the information given to you following your procedure. If we do not reach you, we will leave a message.  However, if you are feeling well and you are not experiencing any problems, there is no need to return our call.  We will assume that you have returned to your regular daily activities without incident.  If any biopsies were taken you will be contacted by phone or by letter within the next 1-3 weeks.  Please call us at [phone] if you have not heard about the biopsies in 3 weeks.    SIGNATURES/CONFIDENTIALITY: You and/or your care partner have signed paperwork which will be entered into your electronic medical record.  These signatures attest to the fact that that the information above on your After Visit Summary has been reviewed and is understood.  Full responsibility of the confidentiality of this discharge information lies with you and/or your care-partner. 

## 2017-12-24 NOTE — Progress Notes (Signed)
Spontaneous respirations throughout. VSS. Resting comfortably. To PACU on room air. Report to  RN. 

## 2017-12-24 NOTE — Op Note (Signed)
[place] Patient Name: Norman Marks Procedure Date: 12/24/2017 8:28 AM MRN: [id number] Endoscopist: Jerene Bears , MD Age: 72 Referring MD:  Date of Birth: 1946/04/16 Gender: Male Account #: [id number] Procedure:                Colonoscopy Indications:              Surveillance: Personal history of adenomatous                            polyps on last colonoscopy 5 years ago Medicines:                Monitored Anesthesia Care Procedure:                Pre-Anesthesia Assessment:                           - Prior to the procedure, a History and Physical                            was performed, and patient medications and                            allergies were reviewed. The patient's tolerance of                            previous anesthesia was also reviewed. The risks                            and benefits of the procedure and the sedation                            options and risks were discussed with the patient.                            All questions were answered, and informed consent                            was obtained. Prior Anticoagulants: The patient has                            taken no previous anticoagulant or antiplatelet                            agents. ASA Grade Assessment: III - A patient with                            severe systemic disease. After reviewing the risks                            and benefits, the patient was deemed in                            satisfactory condition to undergo the procedure.  After obtaining informed consent, the colonoscope                            was passed under direct vision. Throughout the                            procedure, the patient's blood pressure, pulse, and                            oxygen saturations were monitored continuously. The                            Model CF-HQ190L [id number]) scope was introduced                            through the anus and  advanced to the cecum,                            identified by appendiceal orifice and ileocecal                            valve. The colonoscopy was performed without                            difficulty. The patient tolerated the procedure                            well. The quality of the bowel preparation was                            good. The ileocecal valve, appendiceal orifice, and                            rectum were photographed. Scope In: 8:52:47 AM Scope Out: 9:03:52 AM Scope Withdrawal Time: 0 hours 9 minutes 11 seconds  Total Procedure Duration: 0 hours 11 minutes 5 seconds  Findings:                 The digital rectal exam was normal.                           Three sessile polyps were found in the ascending                            colon. The polyps were 3 to 5 mm in size. These                            polyps were removed with a cold snare. Resection                            and retrieval were complete.                           A 4 mm polyp was found in the transverse colon. The  polyp was sessile. The polyp was removed with a                            cold snare. Resection and retrieval were complete.                           Scattered small-mouthed diverticula were found in                            the sigmoid colon and descending colon.                           The retroflexed view of the distal rectum and anal                            verge was normal and showed no anal or rectal                            abnormalities. Complications:            No immediate complications. Estimated Blood Loss:     Estimated blood loss was minimal. Impression:               - Three 3 to 5 mm polyps in the ascending colon,                            removed with a cold snare. Resected and retrieved.                           - One 4 mm polyp in the transverse colon, removed                            with a cold snare. Resected and  retrieved.                           - Diverticulosis in the sigmoid colon and in the                            descending colon.                           - The distal rectum and anal verge are normal on                            retroflexion view. Recommendation:           - Patient has a contact number available for                            emergencies. The signs and symptoms of potential                            delayed complications were discussed with the  patient. Return to normal activities tomorrow.                            Written discharge instructions were provided to the                            patient.                           - Resume previous diet.                           - Continue present medications.                           - Await pathology results.                           - Repeat colonoscopy is recommended for                            surveillance. The colonoscopy date will be                            determined after pathology results from today's                            exam become available for review. Jerene Bears, MD 12/24/2017 9:07:21 AM This report has been signed electronically.

## 2017-12-24 NOTE — Progress Notes (Signed)
Called to room to assist during endoscopic procedure.  Patient ID and intended procedure confirmed with present staff. Received instructions for my participation in the procedure from the performing physician.  

## 2017-12-24 NOTE — Progress Notes (Signed)
Pt's states no medical or surgical changes since previsit or office visit. 

## 2017-12-25 ENCOUNTER — Telehealth: Payer: Self-pay

## 2017-12-25 NOTE — Telephone Encounter (Signed)
  Follow up Call-  Call back number 12/24/2017  Post procedure Call Back phone  # [phone]  Permission to leave phone message Yes  Some recent data might be hidden     Patient questions:  Do you have a fever, pain , or abdominal swelling? No. Pain Score  0 *  Have you tolerated food without any problems? Yes.    Have you been able to return to your normal activities? Yes.    Do you have any questions about your discharge instructions: Diet   No. Medications  No. Follow up visit  No.  Do you have questions or concerns about your Care? No.  Actions: * If pain score is 4 or above: No action needed, pain <4.

## 2017-12-26 ENCOUNTER — Encounter: Payer: Self-pay | Admitting: Internal Medicine

## 2017-12-28 ENCOUNTER — Encounter: Payer: Self-pay | Admitting: Family Medicine

## 2018-02-20 ENCOUNTER — Other Ambulatory Visit: Payer: Self-pay | Admitting: Dermatology

## 2018-03-12 ENCOUNTER — Other Ambulatory Visit: Payer: Self-pay | Admitting: Cardiovascular Disease

## 2018-03-18 ENCOUNTER — Other Ambulatory Visit: Payer: Self-pay | Admitting: Family Medicine

## 2018-03-24 ENCOUNTER — Other Ambulatory Visit: Payer: Self-pay | Admitting: Family Medicine

## 2018-03-24 DIAGNOSIS — Z794 Long term (current) use of insulin: Principal | ICD-10-CM

## 2018-03-24 DIAGNOSIS — E118 Type 2 diabetes mellitus with unspecified complications: Secondary | ICD-10-CM

## 2018-04-11 ENCOUNTER — Other Ambulatory Visit: Payer: Self-pay | Admitting: Cardiovascular Disease

## 2018-04-17 ENCOUNTER — Other Ambulatory Visit: Payer: Self-pay | Admitting: Dermatology

## 2018-04-25 ENCOUNTER — Other Ambulatory Visit: Payer: Self-pay | Admitting: Family Medicine

## 2018-05-21 ENCOUNTER — Other Ambulatory Visit: Payer: Self-pay | Admitting: Family Medicine

## 2018-05-22 ENCOUNTER — Ambulatory Visit: Payer: Medicare Other | Admitting: Physician Assistant

## 2018-05-30 ENCOUNTER — Ambulatory Visit (INDEPENDENT_AMBULATORY_CARE_PROVIDER_SITE_OTHER): Payer: Medicare Other

## 2018-05-30 ENCOUNTER — Other Ambulatory Visit: Payer: Medicare Other

## 2018-05-30 DIAGNOSIS — Z23 Encounter for immunization: Secondary | ICD-10-CM

## 2018-05-30 DIAGNOSIS — Z794 Long term (current) use of insulin: Secondary | ICD-10-CM

## 2018-05-30 DIAGNOSIS — E118 Type 2 diabetes mellitus with unspecified complications: Secondary | ICD-10-CM | POA: Diagnosis not present

## 2018-05-30 LAB — POCT GLYCOSYLATED HEMOGLOBIN (HGB A1C): Hemoglobin A1C: 6.8 % — AB (ref 4.0–5.6)

## 2018-05-30 NOTE — Progress Notes (Signed)
Patient in office today for annual flu vaccine. Tolerated administration of vaccine well to left deltoid. VIS given as directed.   Eye exam scheduled 06/10/18.  POCT A1C completed. Result - 6.8 Foot exam - PCP follow-up requested.

## 2018-06-02 ENCOUNTER — Encounter: Payer: Self-pay | Admitting: Family Medicine

## 2018-06-02 NOTE — Progress Notes (Deleted)
There were no vitals taken for this visit.   CC: 3 mo f/u visit Subjective:    Patient ID: Norman Marks, male    DOB: [birth year], 72 y.o.   MRN: [id number]  HPI: Norman Marks is a 72 y.o. male presenting on 06/03/2018 for No chief complaint on file.   DM - does *** regularly check sugars ***. Compliant with antihyperglycemic regimen which includes: janumet XR 100/1000mg  daily, lantus 40u daily. Denies low sugars or hypoglycemic symptoms. Denies paresthesias. Last diabetic eye exam ***. Pneumovax: 2011. Prevnar: 2015. Glucometer brand: one touch. DSME: completed 09/2015. Lab Results  Component Value Date   HGBA1C 6.8 (A) 05/30/2018   Diabetic Foot Exam - Simple   No data filed     Lab Results  Component Value Date   MICROALBUR 1.6 09/15/2015     Relevant past medical, surgical, family and social history reviewed and updated as indicated. Interim medical history since our last visit reviewed. Allergies and medications reviewed and updated. Outpatient Medications Prior to Visit  Medication Sig Dispense Refill  . aspirin 81 MG tablet Take 81 mg by mouth daily.    . carvedilol (COREG) 25 MG tablet TAKE 1 TABLET BY MOUTH TWICE A DAY WITH A MEAL 60 tablet 0  . Cholecalciferol (VITAMIN D) 2000 units CAPS Take 1 capsule by mouth daily.    . cyclobenzaprine (FLEXERIL) 5 MG tablet Take 0.5-1 tablets (2.5-5 mg total) by mouth 2 (two) times daily. Sedation precautions (Patient not taking: Reported on 12/24/2017) 30 tablet 0  . Insulin Pen Needle (EXEL PEN NEEDLES 31GX1/4") 31G X 6 MM MISC Use as directed 100 each 3  . JANUMET XR [id number] MG TB24 TAKE 1 TABLET BY MOUTH ONCE A DAY 90 tablet 1  . LANTUS 100 UNIT/ML injection INJECT 0.4 ML (40 UNITS TOTAL) INTO THE SKIN AT BEDTIME 10 mL 11  . lovastatin (MEVACOR) 40 MG tablet TAKE 1 TABLET BY MOUTH AT BEDTIME 90 tablet 2  . ONE TOUCH ULTRA TEST test strip CHECK BLOOD SUGAR ONCE OR TWICE DAILY ASDIRECTED 100 each 3  . ramipril (ALTACE) 10 MG  capsule TAKE 1 CAPSULE BY MOUTH ONCE DAILY 90 capsule 3  . SF 5000 PLUS 1.1 % CREA dental cream     . TRUEPLUS INSULIN SYRINGE 31G X 5/16" 1 ML MISC USE TO INJECT INSULIN AS DIRECTED 100 each 1   Facility-Administered Medications Prior to Visit  Medication Dose Route Frequency Provider Last Rate Last Dose  . 0.9 %  sodium chloride infusion  500 mL Intravenous Once Pyrtle, Lajuan Lines, MD         Per HPI unless specifically indicated in ROS section below Review of Systems     Objective:    There were no vitals taken for this visit.  Wt Readings from Last 3 Encounters:  12/24/17 153 lb (69.4 kg)  12/04/17 164 lb (74.4 kg)  10/11/17 153 lb (69.4 kg)    Physical Exam Results for orders placed or performed in visit on 05/30/18  POCT glycosylated hemoglobin (Hb A1C)  Result Value Ref Range   Hemoglobin A1C 6.8 (A) 4.0 - 5.6 %   HbA1c POC (<> result, manual entry)     HbA1c, POC (prediabetic range)     HbA1c, POC (controlled diabetic range)        Assessment & Plan:   Problem List Items Addressed This Visit    None       No orders of the defined types were  placed in this encounter.  No orders of the defined types were placed in this encounter.   Follow up plan: No follow-ups on file.  Norman Bush, MD

## 2018-06-03 ENCOUNTER — Ambulatory Visit: Payer: Medicare Other | Admitting: Family Medicine

## 2018-06-09 ENCOUNTER — Ambulatory Visit (INDEPENDENT_AMBULATORY_CARE_PROVIDER_SITE_OTHER): Payer: Medicare Other | Admitting: Family Medicine

## 2018-06-09 ENCOUNTER — Encounter: Payer: Self-pay | Admitting: Family Medicine

## 2018-06-09 ENCOUNTER — Encounter: Payer: Self-pay | Admitting: Physician Assistant

## 2018-06-09 VITALS — BP 122/70 | HR 93 | Temp 98.4°F | Ht 69.0 in | Wt 168.8 lb

## 2018-06-09 DIAGNOSIS — E118 Type 2 diabetes mellitus with unspecified complications: Secondary | ICD-10-CM

## 2018-06-09 DIAGNOSIS — Z794 Long term (current) use of insulin: Secondary | ICD-10-CM | POA: Diagnosis not present

## 2018-06-09 LAB — POCT GLUCOSE (DEVICE FOR HOME USE): POC Glucose: 147 mg/dl — AB (ref 70–99)

## 2018-06-09 NOTE — Progress Notes (Signed)
BP 122/70 (BP Location: Left Arm, Patient Position: Sitting, Cuff Size: Normal)   Pulse 93   Temp 98.4 F (36.9 C) (Oral)   Ht 5\' 9"  (1.753 m)   Wt 168 lb 12 oz (76.5 kg)   SpO2 97%   BMI 24.92 kg/m    CC: DM f/u visit Subjective:    Patient ID: Norman Marks, male    DOB: Feb 12, 1946, 72 y.o.   MRN: [id number]  HPI: Norman Marks is a 72 y.o. male presenting on 06/09/2018 for Diabetes (Here for 6 mo f/u. Pt accompanied by his wife, Letta Median.)   DM - does regularly check sugars fasting in am - however over 200 this past week. Compliant with antihyperglycemic regimen which includes: janumet 100/1000 XR daily and lantus 40u daily. Denies low sugars or hypoglycemic symptoms. Denies paresthesias. Last diabetic eye exam 04/2017 - rpt due tomorrow. Pneumovax: 2011. Prevnar: 2015. Glucometer brand: one-touch. DSME: 09/2015.  Lab Results  Component Value Date   HGBA1C 6.8 (A) 05/30/2018   Diabetic Foot Exam - Simple   Simple Foot Form Diabetic Foot exam was performed with the following findings:  Yes 06/09/2018  4:01 PM  Visual Inspection No deformities, no ulcerations, no other skin breakdown bilaterally:  Yes Sensation Testing Intact to touch and monofilament testing bilaterally:  Yes Pulse Check Posterior Tibialis and Dorsalis pulse intact bilaterally:  Yes Comments    Lab Results  Component Value Date   MICROALBUR 1.6 09/15/2015     Relevant past medical, surgical, family and social history reviewed and updated as indicated. Interim medical history since our last visit reviewed. Allergies and medications reviewed and updated. Outpatient Medications Prior to Visit  Medication Sig Dispense Refill  . aspirin 81 MG tablet Take 81 mg by mouth daily.    . carvedilol (COREG) 25 MG tablet TAKE 1 TABLET BY MOUTH TWICE A DAY WITH A MEAL 60 tablet 0  . Cholecalciferol (VITAMIN D) 2000 units CAPS Take 1 capsule by mouth daily.    . Insulin Pen Needle (EXEL PEN NEEDLES 31GX1/4") 31G X 6  MM MISC Use as directed 100 each 3  . JANUMET XR [phone] MG TB24 TAKE 1 TABLET BY MOUTH ONCE A DAY 90 tablet 1  . LANTUS 100 UNIT/ML injection INJECT 0.4 ML (40 UNITS TOTAL) INTO THE SKIN AT BEDTIME 10 mL 11  . lovastatin (MEVACOR) 40 MG tablet TAKE 1 TABLET BY MOUTH AT BEDTIME 90 tablet 2  . ONE TOUCH ULTRA TEST test strip CHECK BLOOD SUGAR ONCE OR TWICE DAILY ASDIRECTED 100 each 3  . ramipril (ALTACE) 10 MG capsule TAKE 1 CAPSULE BY MOUTH ONCE DAILY 90 capsule 3  . SF 5000 PLUS 1.1 % CREA dental cream     . TRUEPLUS INSULIN SYRINGE 31G X 5/16" 1 ML MISC USE TO INJECT INSULIN AS DIRECTED 100 each 1  . cyclobenzaprine (FLEXERIL) 5 MG tablet Take 0.5-1 tablets (2.5-5 mg total) by mouth 2 (two) times daily. Sedation precautions (Patient not taking: Reported on 12/24/2017) 30 tablet 0   Facility-Administered Medications Prior to Visit  Medication Dose Route Frequency Provider Last Rate Last Dose  . 0.9 %  sodium chloride infusion  500 mL Intravenous Once Pyrtle, Lajuan Lines, MD         Per HPI unless specifically indicated in ROS section below Review of Systems     Objective:    BP 122/70 (BP Location: Left Arm, Patient Position: Sitting, Cuff Size: Normal)   Pulse 93  Temp 98.4 F (36.9 C) (Oral)   Ht 5\' 9"  (1.753 m)   Wt 168 lb 12 oz (76.5 kg)   SpO2 97%   BMI 24.92 kg/m   Wt Readings from Last 3 Encounters:  06/09/18 168 lb 12 oz (76.5 kg)  12/24/17 153 lb (69.4 kg)  12/04/17 164 lb (74.4 kg)    Physical Exam  Constitutional: He appears well-developed and well-nourished. No distress.  HENT:  Head: Normocephalic and atraumatic.  Right Ear: External ear normal.  Left Ear: External ear normal.  Nose: Nose normal.  Mouth/Throat: Oropharynx is clear and moist. No oropharyngeal exudate.  Eyes: Pupils are equal, round, and reactive to light. Conjunctivae and EOM are normal. No scleral icterus.  Neck: Normal range of motion. Neck supple.  Cardiovascular: Normal rate, regular rhythm,  normal heart sounds and intact distal pulses.  No murmur heard. Pulmonary/Chest: Effort normal and breath sounds normal. No respiratory distress. He has no wheezes. He has no rales.  Musculoskeletal: He exhibits no edema.  See HPI for foot exam if done  Lymphadenopathy:    He has no cervical adenopathy.  Skin: Skin is warm and dry. No rash noted.  Psychiatric: He has a normal mood and affect.  Nursing note and vitals reviewed.  Results for orders placed or performed in visit on 06/09/18  POCT Glucose (Device for Home Use)  Result Value Ref Range   Glucose Fasting, POC     POC Glucose 147 (A) 70 - 99 mg/dl   Lab Results  Component Value Date   HGBA1C 6.8 (A) 05/30/2018    Lab Results  Component Value Date   CHOL 97 10/07/2017   HDL 40.40 10/07/2017   LDLCALC 47 10/07/2017   LDLDIRECT 65.0 01/23/2016   TRIG 48.0 10/07/2017   CHOLHDL 2 10/07/2017       Assessment & Plan:   Problem List Items Addressed This Visit    Diabetes mellitus type 2, controlled, with complications ([place]) - Primary    Chronic, stable. Endorses recently elevated readings. Advised to monitor this at home and let me know if consistently staying high. cbg today - he will check when he gets home.       Relevant Orders   POCT Glucose (Device for Home Use) (Completed)       No orders of the defined types were placed in this encounter.  Orders Placed This Encounter  Procedures  . POCT Glucose (Device for Home Use)    Follow up plan: Return in about 6 months (around 12/08/2018) for annual exam, prior fasting for blood work.  Ria Bush, MD

## 2018-06-09 NOTE — Progress Notes (Signed)
Cardiology Office Note Date:  06/10/2018  Patient ID:  Norman Marks, Norman Marks 01/06/1946, MRN [id number] PCP:  Ria Bush, MD  Cardiologist:  Dr. Fletcher Anon, MD    Chief Complaint: Follow up  History of Present Illness: Norman Marks is a 72 y.o. male with history of CAD as detailed below, LBBB, DM2,, HTN, HLD, migraine disorder, osteoarthritis, and BPH who presents for follow-up of his CAD.  Patient underwent cardiac catheterization in 2009 which showed 1 vessel CAD involving the proximal ramus with 70% stenosis.  He was managed medically.  Follow-up nuclear stress testing in the 12/2013 showed a fixed anterior septal and inferoseptal defects with normal EF.  The defects were felt to be secondary to his left bundle branch block.  He underwent echocardiogram in 07/2015 that showed an EF of 50 to 79%, grade 1 diastolic dysfunction, mildly dilated left atrium, mild mitral regurgitation, and mild pulmonary hypertension.  He was last seen in the office on 02/18/2017 and was doing well without any chest pain, shortness of breath, palpitations, or dizziness.  Labs: 09/2017 - LDL 47, potassium 4.9, serum creatinine 0.60, LFT normal, A1c 6.6, TSH normal.  He comes in doing well today. No symptoms of chest pain, SOB, palpitations, dizziness, presyncope, or syncope. No lower extremity swelling, orthopnea, or early satiety. Blood pressure remains well controlled in the 024O systolic. He is tolerating all medications without issues. No complaints today.   Past Medical History:  Diagnosis Date  . BPH (benign prostatic hypertrophy)   . CAD (coronary artery disease) 2009   Norman Marks (cardiolite 2009 with 70% stenosis of proximal ramus, not amenable to PCI, rec treat medically) --> Norman Marks  . Cancer ([place])    basal cell skin cancer  . DDD (degenerative disc disease), lumbar    s/p surgery  . Diabetes type 2, uncontrolled ([place]) 1990s   referred to [facility] 09/2015  . Headache(784.0)   . History of adenomatous  polyp of colon 10/2012  . History of nephrolithiasis 2012   L ureter, R renal pelvis  . HLD (hyperlipidemia)   . HTN (hypertension)    mild  . Hx of migraines   . Left bundle branch block   . Osteoarthritis of [place] joint of thumb 2014   s/p injections (Norman Marks)  . Osteoarthritis of right shoulder 2015   s/p injections (Norman Marks)  . Osteopenia 06/2011   T score -3, completed 2 yrs forteo, T score improved to -2.2 (2015)  . Vitamin D deficiency     Past Surgical History:  Procedure Laterality Date  . [place], 2008   lumbar; x 3, spinal fusion  . CARDIAC CATHETERIZATION  2009   70% stenosis ostium  . CARDIOVASCULAR STRESS TEST  2011   fixed defect inferior/septal walls without reversibility, LVEF 43% post stress  . COLONOSCOPY  10/2012   tubular adenoma, mild diverticulosis, rpt 5 yrs Norman Marks)  . COLONOSCOPY  12/2017   4 polyps - SSP, TA, diverticulosis (Norman Marks)  . dexa  06/2011   osteoporosis T -2.6 spine, -1.9 hip  . dexa  07/2013   osteopenia T -2.2 femur, -1.2 spine  . FLEXIBLE SIGMOIDOSCOPY  2007   pt reported normal (Norman Marks)  . FOOT SURGERY Left 08/2017  . HAND SURGERY Left 10/2015   Norman arthritis repair Norman Marks)  . KNEE SURGERY Right   . POLYPECTOMY    . SHOULDER SURGERY Right   . SKIN SURGERY  2006   skin cancer  . TENDON REPAIR  08/2017  .  TOTAL SHOULDER ARTHROPLASTY Right 01/26/2014   Procedure: TOTAL SHOULDER ARTHROPLASTY;  Surgeon: Norman Bridge, MD;  Location: [facility];  Service: Orthopedics;  Laterality: Right;  . US ECHOCARDIOGRAPHY  10/2008   nl LV fxn, EF 50%, mild MR, mild pulm HTN    Current Meds  Medication Sig  . aspirin 81 MG tablet Take 81 mg by mouth daily.  . carvedilol (COREG) 25 MG tablet TAKE 1 TABLET BY MOUTH TWICE A DAY WITH A MEAL  . Cholecalciferol (VITAMIN D) 2000 units CAPS Take 1 capsule by mouth daily.  . Insulin Pen Needle (EXEL PEN NEEDLES 31GX1/4") 31G X 6 MM MISC Use as directed  . JANUMET XR [phone] MG TB24 TAKE 1  TABLET BY MOUTH ONCE A DAY  . LANTUS 100 UNIT/ML injection INJECT 0.4 ML (40 UNITS TOTAL) INTO THE SKIN AT BEDTIME  . lovastatin (MEVACOR) 40 MG tablet TAKE 1 TABLET BY MOUTH AT BEDTIME  . ONE TOUCH ULTRA TEST test strip CHECK BLOOD SUGAR ONCE OR TWICE DAILY ASDIRECTED  . ramipril (ALTACE) 10 MG capsule TAKE 1 CAPSULE BY MOUTH ONCE DAILY  . SF 5000 PLUS 1.1 % CREA dental cream   . TRUEPLUS INSULIN SYRINGE 31G X 5/16" 1 ML MISC USE TO INJECT INSULIN AS DIRECTED   Current Facility-Administered Medications for the 06/10/18 encounter (Office Visit) with Norman Mu, PA-C  Medication  . 0.9 %  sodium chloride infusion    Allergies:   Metformin and related and Percocet [oxycodone-acetaminophen]   Social History:  The patient  reports that he quit smoking about 25 years ago. His smoking use included cigarettes. He has a 2.50 pack-year smoking history. He quit smokeless tobacco use about 25 years ago.  His smokeless tobacco use included chew. He reports that he does not drink alcohol or use drugs.   Family History:  The patient's family history includes Cancer in his father and mother; Coronary artery disease (age of onset: 48) in his sister; Diabetes in his brother and father; Heart attack (age of onset: 57) in his sister; Heart disease in his mother.  ROS:   Review of Systems  Constitutional: Negative for chills, diaphoresis, fever, malaise/fatigue and weight loss.  HENT: Negative for congestion.   Eyes: Negative for discharge and redness.  Respiratory: Negative for cough, hemoptysis, sputum production, shortness of breath and wheezing.   Cardiovascular: Negative for chest pain, palpitations, orthopnea, claudication, leg swelling and PND.  Gastrointestinal: Negative for abdominal pain, blood in stool, heartburn, melena, nausea and vomiting.  Genitourinary: Negative for hematuria.  Musculoskeletal: Negative for falls and myalgias.  Skin: Negative for rash.  Neurological: Negative for  dizziness, tingling, tremors, sensory change, speech change, focal weakness, loss of consciousness and weakness.  Endo/Heme/Allergies: Does not bruise/bleed easily.  Psychiatric/Behavioral: Negative for substance abuse. The patient is not nervous/anxious.   All other systems reviewed and are negative.    PHYSICAL EXAM:  VS:  BP 126/64 (BP Location: Left Arm, Patient Position: Sitting, Cuff Size: Normal)   Pulse 88   Ht 5\' 9"  (1.753 m)   Wt 168 lb (76.2 kg)   BMI 24.81 kg/m  BMI: Body mass index is 24.81 kg/m.  Physical Exam  Constitutional: He is oriented to person, place, and time. He appears well-developed and well-nourished.  HENT:  Head: Normocephalic and atraumatic.  Eyes: Right eye exhibits no discharge. Left eye exhibits no discharge.  Neck: Normal range of motion. No JVD present.  Cardiovascular: Normal rate, regular rhythm, S1 normal, S2 normal  and normal heart sounds. Exam reveals no distant heart sounds, no friction rub, no midsystolic click and no opening snap.  No murmur heard. Pulses:      Posterior tibial pulses are 2+ on the right side, and 2+ on the left side.  Pulmonary/Chest: Effort normal and breath sounds normal. No respiratory distress. He has no decreased breath sounds. He has no wheezes. He has no rales. He exhibits no tenderness.  Abdominal: Soft. He exhibits no distension. There is no tenderness.  Musculoskeletal: He exhibits no edema.  Neurological: He is alert and oriented to person, place, and time.  Skin: Skin is warm and dry. No cyanosis. Nails show no clubbing.  Psychiatric: He has a normal mood and affect. His speech is normal and behavior is normal. Judgment and thought content normal.     EKG:  Was ordered and interpreted by me today. Shows NSR, 88 bpm, LBBB (old)   Recent Labs: 07/12/2017: Hemoglobin 14.4; Platelets 234.0 10/07/2017: ALT 20; BUN 14; Creatinine, Ser 0.60; Potassium 4.9; Sodium 143; TSH 0.92  10/07/2017: Cholesterol 97; HDL  40.40; LDL Cholesterol 47; Total CHOL/HDL Ratio 2; Triglycerides 48.0; VLDL 9.6   CrCl cannot be calculated (Patient's most recent lab result is older than the maximum 21 days allowed.).   Wt Readings from Last 3 Encounters:  06/10/18 168 lb (76.2 kg)  06/09/18 168 lb 12 oz (76.5 kg)  12/24/17 153 lb (69.4 kg)     Other studies reviewed: Additional studies/records reviewed today include: summarized above  ASSESSMENT AND PLAN:  1. CAD involving the native coronary arteries without angina: He is doing well without any anginal symptoms. Continue current medical therapy with ASA, Coreg, and lovastatin. No plans for ischemic evaluation at this time.   2. LBBB: Recent echo showed stable, normal LVEF. No symptoms of heart failure or arrhythmia. Continue to monitor clinically.   3. HTN: Blood pressure is well controlled. Continue Coreg and ramipril.   4. HLD: LDL of 47 from 09/2017 with normal LFT at that time. Remains on lovastatin.   Disposition: F/u with Dr. Fletcher Anon or an APP in 12 months.   Current medicines are reviewed at length with the patient today.  The patient did not have any concerns regarding medicines.  Signed, Christell Faith, PA-C 06/10/2018 1:35 PM     [address] [place], [place] [id number] [phone]

## 2018-06-09 NOTE — Assessment & Plan Note (Signed)
Chronic, stable. Endorses recently elevated readings. Advised to monitor this at home and let me know if consistently staying high. cbg today - he will check when he gets home.

## 2018-06-09 NOTE — Patient Instructions (Addendum)
cbg today.  Foot exam today.  You are doing well today.  Monitor this at home and let me know if consistently staying high.  Return as needed or in 6 months for physical.

## 2018-06-10 ENCOUNTER — Encounter: Payer: Self-pay | Admitting: Physician Assistant

## 2018-06-10 ENCOUNTER — Ambulatory Visit (INDEPENDENT_AMBULATORY_CARE_PROVIDER_SITE_OTHER): Payer: Medicare Other | Admitting: Physician Assistant

## 2018-06-10 VITALS — BP 126/64 | HR 88 | Ht 69.0 in | Wt 168.0 lb

## 2018-06-10 DIAGNOSIS — I447 Left bundle-branch block, unspecified: Secondary | ICD-10-CM

## 2018-06-10 DIAGNOSIS — I10 Essential (primary) hypertension: Secondary | ICD-10-CM | POA: Diagnosis not present

## 2018-06-10 DIAGNOSIS — E785 Hyperlipidemia, unspecified: Secondary | ICD-10-CM

## 2018-06-10 DIAGNOSIS — I2510 Atherosclerotic heart disease of native coronary artery without angina pectoris: Secondary | ICD-10-CM | POA: Diagnosis not present

## 2018-06-10 LAB — HM DIABETES EYE EXAM

## 2018-06-10 NOTE — Patient Instructions (Signed)
Medication Instructions:  - Your physician recommends that you continue on your current medications as directed. Please refer to the Current Medication list given to you today.  If you need a refill on your cardiac medications before your next appointment, please call your pharmacy.   Lab work: - none ordered  If you have labs (blood work) drawn today and your tests are completely normal, you will receive your results only by: Marland Kitchen MyChart Message (if you have MyChart) OR . A paper copy in the mail If you have any lab test that is abnormal or we need to change your treatment, we will call you to review the results.  Testing/Procedures: - none ordered  Follow-Up: At [facility], you and your health needs are our priority.  As part of our continuing mission to provide you with exceptional heart care, we have created designated Provider Care Teams.  These Care Teams include your primary Cardiologist (physician) and Advanced Practice Providers (APPs -  Physician Assistants and Nurse Practitioners) who all work together to provide you with the care you need, when you need it. You will need a follow up appointment in 1 year with Dr. Fletcher Anon.  Please call our office 2 months in advance to schedule this appointment.  You may see also see one of the following Advanced Practice Providers on your designated Care Team:   Murray Hodgkins, NP Christell Faith, PA-C . Marrianne Mood, PA-C  Any Other Special Instructions Will Be Listed Below (If Applicable). - N/A

## 2018-06-11 ENCOUNTER — Other Ambulatory Visit: Payer: Self-pay | Admitting: Cardiovascular Disease

## 2018-06-16 ENCOUNTER — Encounter: Payer: Self-pay | Admitting: Family Medicine

## 2018-06-26 ENCOUNTER — Other Ambulatory Visit: Payer: Self-pay | Admitting: Dermatology

## 2018-09-23 ENCOUNTER — Other Ambulatory Visit: Payer: Self-pay | Admitting: Family Medicine

## 2018-09-23 DIAGNOSIS — Z794 Long term (current) use of insulin: Principal | ICD-10-CM

## 2018-09-23 DIAGNOSIS — E118 Type 2 diabetes mellitus with unspecified complications: Secondary | ICD-10-CM

## 2018-10-02 ENCOUNTER — Encounter: Payer: Self-pay | Admitting: Family Medicine

## 2018-10-02 DIAGNOSIS — M1712 Unilateral primary osteoarthritis, left knee: Secondary | ICD-10-CM | POA: Insufficient documentation

## 2018-10-16 ENCOUNTER — Ambulatory Visit: Payer: Medicare Other

## 2018-10-16 ENCOUNTER — Other Ambulatory Visit: Payer: Self-pay | Admitting: Family Medicine

## 2018-10-20 ENCOUNTER — Encounter: Payer: Medicare Other | Admitting: Family Medicine

## 2018-11-06 ENCOUNTER — Other Ambulatory Visit: Payer: Self-pay | Admitting: Cardiovascular Disease

## 2018-11-13 ENCOUNTER — Other Ambulatory Visit: Payer: Self-pay | Admitting: Family Medicine

## 2018-12-09 ENCOUNTER — Other Ambulatory Visit: Payer: Self-pay | Admitting: Family Medicine

## 2018-12-16 ENCOUNTER — Other Ambulatory Visit: Payer: Self-pay | Admitting: Dermatology

## 2018-12-22 ENCOUNTER — Other Ambulatory Visit: Payer: Self-pay | Admitting: Family Medicine

## 2018-12-22 DIAGNOSIS — E118 Type 2 diabetes mellitus with unspecified complications: Secondary | ICD-10-CM

## 2019-01-01 ENCOUNTER — Other Ambulatory Visit: Payer: Self-pay | Admitting: Family Medicine

## 2019-01-15 ENCOUNTER — Other Ambulatory Visit: Payer: Self-pay | Admitting: Family Medicine

## 2019-02-09 ENCOUNTER — Other Ambulatory Visit: Payer: Self-pay | Admitting: Cardiovascular Disease

## 2019-02-23 ENCOUNTER — Other Ambulatory Visit: Payer: Self-pay | Admitting: Family Medicine

## 2019-02-25 ENCOUNTER — Other Ambulatory Visit: Payer: Self-pay | Admitting: Family Medicine

## 2019-02-25 DIAGNOSIS — E118 Type 2 diabetes mellitus with unspecified complications: Secondary | ICD-10-CM

## 2019-02-25 DIAGNOSIS — R7401 Elevation of levels of liver transaminase levels: Secondary | ICD-10-CM

## 2019-02-25 DIAGNOSIS — N138 Other obstructive and reflux uropathy: Secondary | ICD-10-CM

## 2019-02-25 DIAGNOSIS — N401 Enlarged prostate with lower urinary tract symptoms: Secondary | ICD-10-CM

## 2019-02-25 DIAGNOSIS — E785 Hyperlipidemia, unspecified: Secondary | ICD-10-CM

## 2019-02-25 DIAGNOSIS — E559 Vitamin D deficiency, unspecified: Secondary | ICD-10-CM

## 2019-02-26 ENCOUNTER — Other Ambulatory Visit (INDEPENDENT_AMBULATORY_CARE_PROVIDER_SITE_OTHER): Payer: Medicare Other

## 2019-02-26 ENCOUNTER — Ambulatory Visit: Payer: Medicare Other

## 2019-02-26 ENCOUNTER — Other Ambulatory Visit: Payer: Self-pay

## 2019-02-26 DIAGNOSIS — E559 Vitamin D deficiency, unspecified: Secondary | ICD-10-CM | POA: Diagnosis not present

## 2019-02-26 DIAGNOSIS — N401 Enlarged prostate with lower urinary tract symptoms: Secondary | ICD-10-CM | POA: Diagnosis not present

## 2019-02-26 DIAGNOSIS — N138 Other obstructive and reflux uropathy: Secondary | ICD-10-CM | POA: Diagnosis not present

## 2019-02-26 DIAGNOSIS — E785 Hyperlipidemia, unspecified: Secondary | ICD-10-CM

## 2019-02-26 DIAGNOSIS — R7401 Elevation of levels of liver transaminase levels: Secondary | ICD-10-CM

## 2019-02-26 DIAGNOSIS — R740 Nonspecific elevation of levels of transaminase and lactic acid dehydrogenase [LDH]: Secondary | ICD-10-CM | POA: Diagnosis not present

## 2019-02-26 DIAGNOSIS — Z794 Long term (current) use of insulin: Secondary | ICD-10-CM | POA: Diagnosis not present

## 2019-02-26 DIAGNOSIS — E118 Type 2 diabetes mellitus with unspecified complications: Secondary | ICD-10-CM

## 2019-02-26 LAB — COMPREHENSIVE METABOLIC PANEL
ALT: 16 U/L (ref 0–53)
AST: 18 U/L (ref 0–37)
Albumin: 4.6 g/dL (ref 3.5–5.2)
Alkaline Phosphatase: 69 U/L (ref 39–117)
BUN: 30 mg/dL — ABNORMAL HIGH (ref 6–23)
CO2: 28 mEq/L (ref 19–32)
Calcium: 9.4 mg/dL (ref 8.4–10.5)
Chloride: 104 mEq/L (ref 96–112)
Creatinine, Ser: 0.77 mg/dL (ref 0.40–1.50)
GFR: 98.73 mL/min (ref >60.00)
Glucose, Bld: 140 mg/dL — ABNORMAL HIGH (ref 70–99)
Potassium: 4.5 mEq/L (ref 3.5–5.1)
Sodium: 138 mEq/L (ref 135–145)
Total Bilirubin: 0.8 mg/dL (ref 0.2–1.2)
Total Protein: 6.6 g/dL (ref 6.0–8.3)

## 2019-02-26 LAB — LIPID PANEL
Cholesterol: 118 mg/dL (ref 0–200)
HDL: 39 mg/dL — ABNORMAL LOW (ref >39.00)
LDL Cholesterol: 64 mg/dL (ref 0–99)
NonHDL: 79.42
Total CHOL/HDL Ratio: 3
Triglycerides: 75 mg/dL (ref 0.0–149.0)
VLDL: 15 mg/dL (ref 0.0–40.0)

## 2019-02-26 LAB — HEMOGLOBIN A1C: Hgb A1c MFr Bld: 7.2 % — ABNORMAL HIGH (ref 4.6–6.5)

## 2019-02-26 LAB — VITAMIN D 25 HYDROXY (VIT D DEFICIENCY, FRACTURES): VITD: 44.03 ng/mL (ref 30.00–100.00)

## 2019-02-26 LAB — PSA: PSA: 2.1 ng/mL (ref 0.10–4.00)

## 2019-03-02 ENCOUNTER — Encounter: Payer: Self-pay | Admitting: *Deleted

## 2019-03-02 ENCOUNTER — Other Ambulatory Visit: Payer: Self-pay

## 2019-03-03 ENCOUNTER — Encounter: Payer: Self-pay | Admitting: Family Medicine

## 2019-03-03 ENCOUNTER — Ambulatory Visit (INDEPENDENT_AMBULATORY_CARE_PROVIDER_SITE_OTHER): Payer: Medicare Other | Admitting: Family Medicine

## 2019-03-03 VITALS — BP 140/66 | HR 87 | Temp 98.6°F | Ht 67.5 in | Wt 168.0 lb

## 2019-03-03 DIAGNOSIS — E118 Type 2 diabetes mellitus with unspecified complications: Secondary | ICD-10-CM

## 2019-03-03 DIAGNOSIS — Z7189 Other specified counseling: Secondary | ICD-10-CM

## 2019-03-03 DIAGNOSIS — N138 Other obstructive and reflux uropathy: Secondary | ICD-10-CM

## 2019-03-03 DIAGNOSIS — E785 Hyperlipidemia, unspecified: Secondary | ICD-10-CM

## 2019-03-03 DIAGNOSIS — Z0000 Encounter for general adult medical examination without abnormal findings: Secondary | ICD-10-CM | POA: Diagnosis not present

## 2019-03-03 DIAGNOSIS — I2510 Atherosclerotic heart disease of native coronary artery without angina pectoris: Secondary | ICD-10-CM

## 2019-03-03 DIAGNOSIS — Z794 Long term (current) use of insulin: Secondary | ICD-10-CM

## 2019-03-03 DIAGNOSIS — I10 Essential (primary) hypertension: Secondary | ICD-10-CM

## 2019-03-03 DIAGNOSIS — E559 Vitamin D deficiency, unspecified: Secondary | ICD-10-CM

## 2019-03-03 DIAGNOSIS — M8580 Other specified disorders of bone density and structure, unspecified site: Secondary | ICD-10-CM

## 2019-03-03 NOTE — Assessment & Plan Note (Signed)
I-PSS score of 17-5. Interested in medication. Will start flomax after he recovers from upcoming cataract surgery.

## 2019-03-03 NOTE — Assessment & Plan Note (Signed)
Followed by cards. Continue aspirin, statin.  

## 2019-03-03 NOTE — Assessment & Plan Note (Signed)
Preventative protocols reviewed and updated unless pt declined. Discussed healthy diet and lifestyle.  

## 2019-03-03 NOTE — Assessment & Plan Note (Signed)
Chronic, stable. Continue current regimen. 

## 2019-03-03 NOTE — Assessment & Plan Note (Signed)
Levels at goal.

## 2019-03-03 NOTE — Assessment & Plan Note (Signed)
Reviewed recent DEXA. Encouraged restarting vit D.

## 2019-03-03 NOTE — Progress Notes (Signed)
This visit was conducted in person.  BP 140/66 (BP Location: Left Arm, Patient Position: Sitting, Cuff Size: Normal)   Pulse 87   Temp 98.6 F (37 C) (Temporal)   Ht 5' 7.5" (1.715 m)   Wt 168 lb (76.2 kg)   SpO2 97%   BMI 25.92 kg/m    CC: CPE Subjective:    Patient ID: Norman Marks, male    DOB: 1946-04-25, 73 y.o.   MRN: [id number]  HPI: Norman Marks is a 73 y.o. male presenting on 03/03/2019 for Medicare Wellness   Did not see health advisor this year.    Hearing Screening   125Hz  250Hz  500Hz  1000Hz  2000Hz  3000Hz  4000Hz  6000Hz  8000Hz   Right ear:   20 20 25   0    Left ear:   20 0 0  0    Comments: Wears bilateral hearing aids. Not wearing today.  Vision Screening Comments: Last eye exam, 06/2018    Office Visit from 03/03/2019 in [facility] [place]  PHQ-2 Total Score  0      Fall Risk  03/03/2019 10/07/2017 10/03/2016 10/20/2015 10/13/2015  Falls in the past year? 0 No No (No Data) No  Comment - - - no falls in past week -    Preventative: COLONOSCOPY 12/2017 - 4 polyps - SSP, TA, diverticulosis, rpt 3 yrs ([facility]) Prostate cancer screening - yearly. Endorses 3-4 nocturia, weakening, urgency. May be interested in flomax after cataract surgery.  DEXA 07/2013 osteopenia T -2.2 femur, -1.2 spine  DEXA 04/2017 osteopenia T score -2.1 hip, -1.9 spine Flu yearly penumovax 06/28/2010.Prevnar 09/2013 Tetanus around 2011 Zostavax - 01/2013 shingrix - discussed  Advanced directive: doesn't have one set up. Has talked about this with wife.Wife would be HCPOA.Does not want drastic measures. Has paperwork at home. Asked to bring [place] copy.  Seat belt use discussed Sunscreen use discussed. No changing moles on skin. Sees derm yearly. Planned Mohs to R temple.  Ex smoker - quit 1994 Alcohol - none Dentist q6 mo Eye exam yearly - upcoming cataract surgery Bowels - no constipation Bladder - no incontinence  Caffeine: 1 cup coffee/day  Lives with wife, 1  dog, 5 cats.  Occupation: retired  Edu: 12th grade  Activity: no regular exercise.  Diet: good water, fruits/vegetables daily     Relevant past medical, surgical, family and social history reviewed and updated as indicated. Interim medical history since our last visit reviewed. Allergies and medications reviewed and updated. Outpatient Medications Prior to Visit  Medication Sig Dispense Refill  . aspirin 81 MG tablet Take 81 mg by mouth daily.    . carvedilol (COREG) 25 MG tablet TAKE 1 TABLET BY MOUTH TWICE (2) DAILY WITH A MEAL 60 tablet 5  . Insulin Pen Needle (EXEL PEN NEEDLES 31GX1/4") 31G X 6 MM MISC Use as directed 100 each 3  . JANUMET XR [phone] MG TB24 TAKE 1 TABLET BY MOUTH ONCE DAILY 90 tablet 0  . LANTUS 100 UNIT/ML injection INJECT 0.4 ML (40 UNITS TOTAL) INTO THE SKIN AT BEDTIME 10 mL 0  . lovastatin (MEVACOR) 40 MG tablet TAKE 1 TABLET BY MOUTH EVERY NIGHT AT BEDTIME 90 tablet 2  . ONE TOUCH ULTRA TEST test strip CHECK BLOOD SUGAR ONCE OR TWICE DAILY ASDIRECTED 100 each 3  . ramipril (ALTACE) 10 MG capsule TAKE 1 CAPSULE BY MOUTH ONCE DAILY 90 capsule 0  . TRUEPLUS INSULIN SYRINGE 31G X 5/16" 1 ML MISC USE TO INJECT INSULIN AS  DIRECTED 100 each 1  . SF 5000 PLUS 1.1 % CREA dental cream      No facility-administered medications prior to visit.      Per HPI unless specifically indicated in ROS section below Review of Systems  Constitutional: Negative for activity change, appetite change, chills, fatigue, fever and unexpected weight change.  HENT: Negative for hearing loss.   Eyes: Negative for visual disturbance.  Respiratory: Negative for cough, chest tightness, shortness of breath and wheezing.   Cardiovascular: Negative for chest pain, palpitations and leg swelling.  Gastrointestinal: Negative for abdominal distention, abdominal pain, blood in stool, constipation, diarrhea, nausea and vomiting.  Genitourinary: Negative for difficulty urinating and hematuria.   Musculoskeletal: Negative for arthralgias, myalgias and neck pain.  Skin: Negative for rash.  Neurological: Negative for dizziness, seizures, syncope and headaches.  Hematological: Negative for adenopathy. Bruises/bleeds easily.  Psychiatric/Behavioral: Negative for dysphoric mood. The patient is not nervous/anxious.    Objective:    BP 140/66 (BP Location: Left Arm, Patient Position: Sitting, Cuff Size: Normal)   Pulse 87   Temp 98.6 F (37 C) (Temporal)   Ht 5' 7.5" (1.715 m)   Wt 168 lb (76.2 kg)   SpO2 97%   BMI 25.92 kg/m   Wt Readings from Last 3 Encounters:  03/03/19 168 lb (76.2 kg)  06/10/18 168 lb (76.2 kg)  06/09/18 168 lb 12 oz (76.5 kg)    Physical Exam Vitals signs and nursing note reviewed.  Constitutional:      General: He is not in acute distress.    Appearance: Normal appearance. He is well-developed. He is not ill-appearing.  HENT:     Head: Normocephalic and atraumatic.     Right Ear: Hearing, tympanic membrane, ear canal and external ear normal.     Left Ear: Hearing, tympanic membrane, ear canal and external ear normal.     Nose: Nose normal.     Mouth/Throat:     Mouth: Mucous membranes are moist.     Pharynx: Uvula midline. No oropharyngeal exudate or posterior oropharyngeal erythema.  Eyes:     General: No scleral icterus.    Extraocular Movements: Extraocular movements intact.     Conjunctiva/sclera: Conjunctivae normal.     Pupils: Pupils are equal, round, and reactive to light.  Neck:     Musculoskeletal: Normal range of motion and neck supple.     Vascular: No carotid bruit.  Cardiovascular:     Rate and Rhythm: Normal rate and regular rhythm.     Pulses: Normal pulses.          Radial pulses are 2+ on the right side and 2+ on the left side.     Heart sounds: Normal heart sounds. No murmur.  Pulmonary:     Effort: Pulmonary effort is normal. No respiratory distress.     Breath sounds: Normal breath sounds. No wheezing, rhonchi or rales.   Abdominal:     General: Abdomen is flat. Bowel sounds are normal. There is no distension.     Palpations: Abdomen is soft. There is no mass.     Tenderness: There is no abdominal tenderness. There is no guarding or rebound.     Hernia: No hernia is present.  Genitourinary:    Prostate: Enlarged (30gm). Not tender and no nodules present.     Rectum: External hemorrhoid (non inflamed) present. No mass, tenderness, anal fissure or internal hemorrhoid. Normal anal tone.  Musculoskeletal: Normal range of motion.  Lymphadenopathy:  Cervical: No cervical adenopathy.  Skin:    General: Skin is warm and dry.     Findings: No rash.  Neurological:     General: No focal deficit present.     Mental Status: He is alert and oriented to person, place, and time.     Comments: CN grossly intact, station and gait intact Recall 3/3 Calculation 4/5 [id number]  Psychiatric:        Mood and Affect: Mood normal.        Behavior: Behavior normal.        Thought Content: Thought content normal.        Judgment: Judgment normal.       Results for orders placed or performed in visit on 02/26/19  VITAMIN D 25 Hydroxy (Vit-D Deficiency, Fractures)  Result Value Ref Range   VITD 44.03 30.00 - 100.00 ng/mL  PSA  Result Value Ref Range   PSA 2.10 0.10 - 4.00 ng/mL  Hemoglobin A1c  Result Value Ref Range   Hgb A1c MFr Bld 7.2 (H) 4.6 - 6.5 %  Comprehensive metabolic panel  Result Value Ref Range   Sodium 138 135 - 145 mEq/L   Potassium 4.5 3.5 - 5.1 mEq/L   Chloride 104 96 - 112 mEq/L   CO2 28 19 - 32 mEq/L   Glucose, Bld 140 (H) 70 - 99 mg/dL   BUN 30 (H) 6 - 23 mg/dL   Creatinine, Ser 0.77 0.40 - 1.50 mg/dL   Total Bilirubin 0.8 0.2 - 1.2 mg/dL   Alkaline Phosphatase 69 39 - 117 U/L   AST 18 0 - 37 U/L   ALT 16 0 - 53 U/L   Total Protein 6.6 6.0 - 8.3 g/dL   Albumin 4.6 3.5 - 5.2 g/dL   Calcium 9.4 8.4 - 10.5 mg/dL   GFR 98.73 >60.00 mL/min  Lipid panel  Result Value Ref Range    Cholesterol 118 0 - 200 mg/dL   Triglycerides 75.0 0.0 - 149.0 mg/dL   HDL 39.00 (L) >39.00 mg/dL   VLDL 15.0 0.0 - 40.0 mg/dL   LDL Cholesterol 64 0 - 99 mg/dL   Total CHOL/HDL Ratio 3    NonHDL 79.42    Assessment & Plan:   Problem List Items Addressed This Visit    Vitamin D deficiency    Levels at goal.       Osteopenia    Reviewed recent DEXA. Encouraged restarting vit D.       Medicare annual wellness visit, subsequent - Primary    I have personally reviewed the Medicare Annual Wellness questionnaire and have noted 1. The patient's medical and social history 2. Their use of alcohol, tobacco or illicit drugs 3. Their current medications and supplements 4. The patient's functional ability including ADL's, fall risks, home safety risks and hearing or visual impairment. Cognitive function has been assessed and addressed as indicated.  5. Diet and physical activity 6. Evidence for depression or mood disorders The patients weight, height, BMI have been recorded in the chart. I have made referrals, counseling and provided education to the patient based on review of the above and I have provided the pt with a written personalized care plan for preventive services. Provider list updated.. See scanned questionairre as needed for further documentation. Reviewed preventative protocols and updated unless pt declined.       HTN (hypertension)    Chronic, stable. Continue current regimen.       HLD (hyperlipidemia)    Chronic,  stable. Continue current regimen.       Health maintenance examination    Preventative protocols reviewed and updated unless pt declined. Discussed healthy diet and lifestyle.       Diabetes mellitus type 2, controlled, with complications (HCC)    Chronic, stable on current regimen.  Discussed pricing out weekly GLP1 RA.       CAD (coronary artery disease)    Followed by cards. Continue aspirin, statin       BPH with obstruction/lower urinary  tract symptoms    I-PSS score of 17-5. Interested in medication. Will start flomax after he recovers from upcoming cataract surgery.       Advanced care planning/counseling discussion    Advanced directive: doesn't have one set up. Has talked about this with wife.Wife would be HCPOA.Does not want drastic measures. Has paperwork at home. Asked to bring [place] copy.           No orders of the defined types were placed in this encounter.  No orders of the defined types were placed in this encounter.   Patient instructions: Let us know after healed up from cataract surgeries to consider flomax prostate medicine for night time awakenings Bring [place] copy of your living will to update chart Price out ozempic or trulicity once weekly injection diabetes medicine in place of januvia Good to see you today, call us with questions. Return in 6 months for follow up visit.   Follow up plan: Return in about 6 months (around 09/03/2019) for follow up visit.  Ria Bush, MD

## 2019-03-03 NOTE — Assessment & Plan Note (Signed)
Advanced directive: doesn't have one set up. Has talked about this with wife.Wife would be HCPOA.Does not want drastic measures. Has paperwork at home. Asked to bring [place] copy.

## 2019-03-03 NOTE — Assessment & Plan Note (Addendum)
Chronic, stable on current regimen.  Discussed pricing out weekly GLP1 RA.

## 2019-03-03 NOTE — Assessment & Plan Note (Signed)

## 2019-03-03 NOTE — Patient Instructions (Addendum)
Let us know after healed up from cataract surgeries to consider flomax prostate medicine for night time awakenings Bring [place] copy of your living will to update chart Price out ozempic or trulicity once weekly injection diabetes medicine in place of januvia Good to see you today, call us with questions. Return in 6 months for follow up visit.   Health Maintenance After Age 74 After age 84, you are at a higher risk for certain long-term diseases and infections as well as injuries from falls. Falls are a major cause of broken bones and head injuries in people who are older than age 81. Getting regular preventive care can help to keep you healthy and well. Preventive care includes getting regular testing and making lifestyle changes as recommended by your health care provider. Talk with your health care provider about:  Which screenings and tests you should have. A screening is a test that checks for a disease when you have no symptoms.  A diet and exercise plan that is right for you. What should I know about screenings and tests to prevent falls? Screening and testing are the best ways to find a health problem early. Early diagnosis and treatment give you the best chance of managing medical conditions that are common after age 44. Certain conditions and lifestyle choices may make you more likely to have a fall. Your health care provider may recommend:  Regular vision checks. Poor vision and conditions such as cataracts can make you more likely to have a fall. If you wear glasses, make sure to get your prescription updated if your vision changes.  Medicine review. Work with your health care provider to regularly review all of the medicines you are taking, including over-the-counter medicines. Ask your health care provider about any side effects that may make you more likely to have a fall. Tell your health care provider if any medicines that you take make you feel dizzy or sleepy.  Osteoporosis  screening. Osteoporosis is a condition that causes the bones to get weaker. This can make the bones weak and cause them to break more easily.  Blood pressure screening. Blood pressure changes and medicines to control blood pressure can make you feel dizzy.  Strength and balance checks. Your health care provider may recommend certain tests to check your strength and balance while standing, walking, or changing positions.  Foot health exam. Foot pain and numbness, as well as not wearing proper footwear, can make you more likely to have a fall.  Depression screening. You may be more likely to have a fall if you have a fear of falling, feel emotionally low, or feel unable to do activities that you used to do.  Alcohol use screening. Using too much alcohol can affect your balance and may make you more likely to have a fall. What actions can I take to lower my risk of falls? General instructions  Talk with your health care provider about your risks for falling. Tell your health care provider if: ? You fall. Be sure to tell your health care provider about all falls, even ones that seem minor. ? You feel dizzy, sleepy, or off-balance.  Take over-the-counter and prescription medicines only as told by your health care provider. These include any supplements.  Eat a healthy diet and maintain a healthy weight. A healthy diet includes low-fat dairy products, low-fat (lean) meats, and fiber from whole grains, beans, and lots of fruits and vegetables. Home safety  Remove any tripping hazards, such as rugs, cords,  and clutter.  Install safety equipment such as grab bars in bathrooms and safety rails on stairs.  Keep rooms and walkways well-lit. Activity   Follow a regular exercise program to stay fit. This will help you maintain your balance. Ask your health care provider what types of exercise are appropriate for you.  If you need a cane or walker, use it as recommended by your health care  provider.  Wear supportive shoes that have nonskid soles. Lifestyle  Do not drink alcohol if your health care provider tells you not to drink.  If you drink alcohol, limit how much you have: ? 0-1 drink a day for women. ? 0-2 drinks a day for men.  Be aware of how much alcohol is in your drink. In the [place] one drink equals one typical bottle of beer (12 oz), one-half glass of wine (5 oz), or one shot of hard liquor (1 oz).  Do not use any products that contain nicotine or tobacco, such as cigarettes and e-cigarettes. If you need help quitting, ask your health care provider. Summary  Having a healthy lifestyle and getting preventive care can help to protect your health and wellness after age 70.  Screening and testing are the best way to find a health problem early and help you avoid having a fall. Early diagnosis and treatment give you the best chance for managing medical conditions that are more common for people who are older than age 38.  Falls are a major cause of broken bones and head injuries in people who are older than age 54. Take precautions to prevent a fall at home.  Work with your health care provider to learn what changes you can make to improve your health and wellness and to prevent falls. This information is not intended to replace advice given to you by your health care provider. Make sure you discuss any questions you have with your health care provider. Document Released: 05/15/2017 Document Revised: 10/23/2018 Document Reviewed: 05/15/2017 Elsevier Patient Education  2020 [employer].

## 2019-03-04 NOTE — Discharge Instructions (Signed)

## 2019-03-05 ENCOUNTER — Other Ambulatory Visit: Payer: Self-pay

## 2019-03-05 ENCOUNTER — Other Ambulatory Visit
Admission: RE | Admit: 2019-03-05 | Discharge: 2019-03-05 | Disposition: A | Discharge status: Home / Self Care | Payer: Medicare Other | Source: Ambulatory Visit | Attending: Ophthalmology | Admitting: Ophthalmology

## 2019-03-05 DIAGNOSIS — Z01812 Encounter for preprocedural laboratory examination: Secondary | ICD-10-CM | POA: Diagnosis not present

## 2019-03-05 DIAGNOSIS — Z20828 Contact with and (suspected) exposure to other viral communicable diseases: Secondary | ICD-10-CM | POA: Diagnosis not present

## 2019-03-05 LAB — SARS CORONAVIRUS 2 (TAT 6-24 HRS): SARS Coronavirus 2: NEGATIVE

## 2019-03-09 ENCOUNTER — Ambulatory Visit: Payer: Medicare Other | Admitting: Anesthesiology

## 2019-03-09 ENCOUNTER — Other Ambulatory Visit: Payer: Self-pay

## 2019-03-09 ENCOUNTER — Encounter
Admission: RE | Disposition: A | Discharge status: Home / Self Care | Payer: Self-pay | Source: Home / Self Care | Attending: Ophthalmology

## 2019-03-09 ENCOUNTER — Ambulatory Visit
Admission: RE | Admit: 2019-03-09 | Discharge: 2019-03-09 | Disposition: A | Discharge status: Home / Self Care | Payer: Medicare Other | Attending: Ophthalmology | Admitting: Ophthalmology

## 2019-03-09 DIAGNOSIS — H2181 Floppy iris syndrome: Secondary | ICD-10-CM | POA: Insufficient documentation

## 2019-03-09 DIAGNOSIS — Z87891 Personal history of nicotine dependence: Secondary | ICD-10-CM | POA: Insufficient documentation

## 2019-03-09 DIAGNOSIS — Z885 Allergy status to narcotic agent status: Secondary | ICD-10-CM | POA: Diagnosis not present

## 2019-03-09 DIAGNOSIS — Z85828 Personal history of other malignant neoplasm of skin: Secondary | ICD-10-CM | POA: Diagnosis not present

## 2019-03-09 DIAGNOSIS — I10 Essential (primary) hypertension: Secondary | ICD-10-CM | POA: Diagnosis not present

## 2019-03-09 DIAGNOSIS — H2511 Age-related nuclear cataract, right eye: Secondary | ICD-10-CM | POA: Insufficient documentation

## 2019-03-09 DIAGNOSIS — Z794 Long term (current) use of insulin: Secondary | ICD-10-CM | POA: Diagnosis not present

## 2019-03-09 DIAGNOSIS — E7800 Pure hypercholesterolemia, unspecified: Secondary | ICD-10-CM | POA: Insufficient documentation

## 2019-03-09 DIAGNOSIS — Z96611 Presence of right artificial shoulder joint: Secondary | ICD-10-CM | POA: Diagnosis not present

## 2019-03-09 DIAGNOSIS — Z79899 Other long term (current) drug therapy: Secondary | ICD-10-CM | POA: Insufficient documentation

## 2019-03-09 DIAGNOSIS — Z7982 Long term (current) use of aspirin: Secondary | ICD-10-CM | POA: Insufficient documentation

## 2019-03-09 DIAGNOSIS — I2510 Atherosclerotic heart disease of native coronary artery without angina pectoris: Secondary | ICD-10-CM | POA: Insufficient documentation

## 2019-03-09 DIAGNOSIS — E1136 Type 2 diabetes mellitus with diabetic cataract: Secondary | ICD-10-CM | POA: Insufficient documentation

## 2019-03-09 HISTORY — DX: Presence of external hearing-aid: Z97.4

## 2019-03-09 HISTORY — PX: CATARACT EXTRACTION W/PHACO: SHX586

## 2019-03-09 LAB — GLUCOSE, CAPILLARY
Glucose-Capillary: 198 mg/dL — ABNORMAL HIGH (ref 70–99)
Glucose-Capillary: 208 mg/dL — ABNORMAL HIGH (ref 70–99)

## 2019-03-09 SURGERY — PHACOEMULSIFICATION, CATARACT, WITH IOL INSERTION
Anesthesia: Monitor Anesthesia Care | Site: Eye | Laterality: Right

## 2019-03-09 MED ORDER — MIDAZOLAM HCL 2 MG/2ML IJ SOLN
INTRAMUSCULAR | Status: DC | PRN
  Administered 2019-03-09: 1 mg via INTRAVENOUS

## 2019-03-09 MED ORDER — TETRACAINE HCL 0.5 % OP SOLN
1.0000 [drp] | OPHTHALMIC | Status: DC | PRN
  Administered 2019-03-09 (×3): 1 [drp] via OPHTHALMIC

## 2019-03-09 MED ORDER — LACTATED RINGERS IV SOLN: INTRAVENOUS | Status: DC

## 2019-03-09 MED ORDER — EPINEPHRINE PF 1 MG/ML IJ SOLN
INTRAOCULAR | Status: DC | PRN
  Administered 2019-03-09: 113 mL via OPHTHALMIC

## 2019-03-09 MED ORDER — LIDOCAINE HCL (PF) 2 % IJ SOLN
INTRAOCULAR | Status: DC | PRN
  Administered 2019-03-09: 1 mL via INTRAOCULAR

## 2019-03-09 MED ORDER — ACETAMINOPHEN 325 MG PO TABS: 325.0000 mg | ORAL_TABLET | Freq: Once | ORAL | Status: DC

## 2019-03-09 MED ORDER — SODIUM HYALURONATE 10 MG/ML IO SOLN
INTRAOCULAR | Status: DC | PRN
  Administered 2019-03-09: 0.55 mL via INTRAOCULAR

## 2019-03-09 MED ORDER — ARMC OPHTHALMIC DILATING DROPS
1.0000 "application " | OPHTHALMIC | Status: DC | PRN
  Administered 2019-03-09 (×3): 1 via OPHTHALMIC

## 2019-03-09 MED ORDER — ACETAMINOPHEN 160 MG/5ML PO SOLN: 325.0000 mg | Freq: Once | ORAL | Status: DC

## 2019-03-09 MED ORDER — MOXIFLOXACIN HCL 0.5 % OP SOLN
OPHTHALMIC | Status: DC | PRN
  Administered 2019-03-09: 0.2 mL via OPHTHALMIC

## 2019-03-09 MED ORDER — FENTANYL CITRATE (PF) 100 MCG/2ML IJ SOLN
INTRAMUSCULAR | Status: DC | PRN
  Administered 2019-03-09: 50 ug via INTRAVENOUS

## 2019-03-09 MED ORDER — SODIUM HYALURONATE 23 MG/ML IO SOLN
INTRAOCULAR | Status: DC | PRN
  Administered 2019-03-09: 0.6 mL via INTRAOCULAR

## 2019-03-09 SURGICAL SUPPLY — 19 items
CANNULA ANT/CHMB 27G (MISCELLANEOUS) ×2 IMPLANT
CANNULA ANT/CHMB 27GA (MISCELLANEOUS) ×6 IMPLANT
DISSECTOR HYDRO NUCLEUS 50X22 (MISCELLANEOUS) ×3 IMPLANT
GLOVE SURG LX 7.5 STRW (GLOVE) ×2
GLOVE SURG LX STRL 7.5 STRW (GLOVE) ×1 IMPLANT
GLOVE SURG SYN 8.5  E (GLOVE) ×2
GLOVE SURG SYN 8.5 E (GLOVE) ×1 IMPLANT
GLOVE SURG SYN 8.5 PF PI (GLOVE) ×1 IMPLANT
GOWN STRL REUS W/ TWL LRG LVL3 (GOWN DISPOSABLE) ×2 IMPLANT
GOWN STRL REUS W/TWL LRG LVL3 (GOWN DISPOSABLE) ×4
LENS IOL TECNIS ITEC 21.5 (Intraocular Lens) ×2 IMPLANT
MARKER SKIN DUAL TIP RULER LAB (MISCELLANEOUS) ×3 IMPLANT
PACK DR. KING ARMS (PACKS) ×3 IMPLANT
PACK EYE AFTER SURG (MISCELLANEOUS) ×3 IMPLANT
PACK OPTHALMIC (MISCELLANEOUS) ×3 IMPLANT
SYR 3ML LL SCALE MARK (SYRINGE) ×3 IMPLANT
SYR TB 1ML LUER SLIP (SYRINGE) ×3 IMPLANT
WATER STERILE IRR 250ML POUR (IV SOLUTION) ×3 IMPLANT
WIPE NON LINTING 3.25X3.25 (MISCELLANEOUS) ×3 IMPLANT

## 2019-03-09 NOTE — Anesthesia Postprocedure Evaluation (Signed)
Anesthesia Post Note  Patient: KENDYL BISSONNETTE  Procedure(s) Performed: CATARACT EXTRACTION PHACO AND INTRAOCULAR LENS PLACEMENT (IOC) RIGHT DIABETES (Right Eye)  Patient location during evaluation: PACU Anesthesia Type: MAC Level of consciousness: awake and alert and oriented Pain management: satisfactory to patient Vital Signs Assessment: post-procedure vital signs reviewed and stable Respiratory status: spontaneous breathing, nonlabored ventilation and respiratory function stable Cardiovascular status: blood pressure returned to baseline and stable Postop Assessment: Adequate PO intake and No signs of nausea or vomiting Anesthetic complications: no    Raliegh Ip

## 2019-03-09 NOTE — Op Note (Signed)
OPERATIVE NOTE  Norman Marks [id number] 03/09/2019   PREOPERATIVE DIAGNOSIS:  Nuclear sclerotic cataract right eye.  H25.11   POSTOPERATIVE DIAGNOSIS:     1.  Nuclear sclerotic cataract right eye.   2.  Intraoperative floppy iris syndrome.   PROCEDURE:  Phacoemusification with posterior chamber intraocular lens placement of the right eye   LENS:   Implant Name Type Inv. Item Serial No. Manufacturer Lot No. LRB No. Used Action  LENS IOL DIOP 21.5 - [id number] Intraocular Lens LENS IOL DIOP 21.5 [id number]  Right 1 Implanted       PCB00 +21.5   ULTRASOUND TIME: 1 minutes 25 seconds.  CDE 16.48   SURGEON:  Benay Pillow, MD, MPH  ANESTHESIOLOGIST: Anesthesiologist: Ronelle Nigh, MD CRNA: Cameron Ali, CRNA   ANESTHESIA:  Topical with tetracaine drops augmented with 1% preservative-free intracameral lidocaine.  ESTIMATED BLOOD LOSS: less than 1 mL.   COMPLICATIONS:  None.   DESCRIPTION OF PROCEDURE:  The patient was identified in the holding room and transported to the operating room and placed in the supine position under the operating microscope.  The right eye was identified as the operative eye and it was prepped and draped in the usual sterile ophthalmic fashion.   A 1.0 millimeter clear-corneal paracentesis was made at the 10:30 position. 0.5 ml of preservative-free 1% lidocaine with epinephrine was injected into the anterior chamber.  The anterior chamber was filled with Healon 5 viscoelastic.  A 2.4 millimeter keratome was used to make a near-clear corneal incision at the 8:00 position.  A curvilinear capsulorrhexis was made with a cystotome and capsulorrhexis forceps.  Balanced salt solution was used to hydrodissect and hydrodelineate the nucleus.   Phacoemulsification was then used in stop and chop fashion to remove the lens nucleus and epinucleus.  The remaining cortex was then removed using the irrigation and aspiration handpiece. Healon was then placed into  the capsular bag to distend it for lens placement.  A lens was then injected into the capsular bag.  The remaining viscoelastic was aspirated.  The iris was floppy throughout the case but there was no iris prolapse or complication.   Wounds were hydrated with balanced salt solution.  The anterior chamber was inflated to a physiologic pressure with balanced salt solution.   Intracameral vigamox 0.1 mL undiluted was injected into the eye and a drop placed onto the ocular surface.  No wound leaks were noted.  The patient was taken to the recovery room in stable condition without complications of anesthesia or surgery  Benay Pillow 03/09/2019, 9:11 AM

## 2019-03-09 NOTE — Transfer of Care (Signed)
Immediate Anesthesia Transfer of Care Note  Patient: Norman Marks  Procedure(s) Performed: CATARACT EXTRACTION PHACO AND INTRAOCULAR LENS PLACEMENT (IOC) RIGHT DIABETES (Right Eye)  Patient Location: PACU  Anesthesia Type: MAC  Level of Consciousness: awake, alert  and patient cooperative  Airway and Oxygen Therapy: Patient Spontanous Breathing and Patient connected to supplemental oxygen  Post-op Assessment: Post-op Vital signs reviewed, Patient's Cardiovascular Status Stable, Respiratory Function Stable, Patent Airway and No signs of Nausea or vomiting  Post-op Vital Signs: Reviewed and stable  Complications: No apparent anesthesia complications

## 2019-03-09 NOTE — H&P (Signed)

## 2019-03-09 NOTE — Anesthesia Preprocedure Evaluation (Signed)
Anesthesia Evaluation  Patient identified by MRN, date of birth, ID band Patient awake    Reviewed: Allergy & Precautions, H&P , NPO status , Patient's Chart, lab work & pertinent test results  Airway Mallampati: II  TM Distance: >3 FB Neck ROM: full    Dental no notable dental hx.    Pulmonary former smoker,    Pulmonary exam normal breath sounds clear to auscultation       Cardiovascular hypertension, + CAD  Normal cardiovascular exam Rhythm:regular Rate:Normal     Neuro/Psych    GI/Hepatic   Endo/Other  diabetes, Type 2  Renal/GU      Musculoskeletal   Abdominal   Peds  Hematology   Anesthesia Other Findings   Reproductive/Obstetrics                             Anesthesia Physical Anesthesia Plan  ASA: III  Anesthesia Plan: MAC   Post-op Pain Management:    Induction:   PONV Risk Score and Plan: 1 and Midazolam, TIVA and Treatment may vary due to age or medical condition  Airway Management Planned:   Additional Equipment:   Intra-op Plan:   Post-operative Plan:   Informed Consent: I have reviewed the patients History and Physical, chart, labs and discussed the procedure including the risks, benefits and alternatives for the proposed anesthesia with the patient or authorized representative who has indicated his/her understanding and acceptance.       Plan Discussed with: CRNA  Anesthesia Plan Comments:         Anesthesia Quick Evaluation

## 2019-03-09 NOTE — Anesthesia Procedure Notes (Signed)
Procedure Name: MAC Date/Time: 03/09/2019 8:45 AM Performed by: Cameron Ali, CRNA Pre-anesthesia Checklist: Patient identified, Emergency Drugs available, Suction available, Timeout performed and Patient being monitored Patient Re-evaluated:Patient Re-evaluated prior to induction Oxygen Delivery Method: Nasal cannula Placement Confirmation: positive ETCO2

## 2019-03-10 ENCOUNTER — Encounter: Payer: Self-pay | Admitting: Ophthalmology

## 2019-03-17 ENCOUNTER — Other Ambulatory Visit: Payer: Self-pay | Admitting: Family Medicine

## 2019-03-19 ENCOUNTER — Other Ambulatory Visit: Payer: Self-pay | Admitting: Family Medicine

## 2019-03-24 ENCOUNTER — Other Ambulatory Visit: Payer: Self-pay | Admitting: Family Medicine

## 2019-03-24 DIAGNOSIS — Z794 Long term (current) use of insulin: Secondary | ICD-10-CM

## 2019-03-24 DIAGNOSIS — E118 Type 2 diabetes mellitus with unspecified complications: Secondary | ICD-10-CM

## 2019-03-26 ENCOUNTER — Other Ambulatory Visit: Payer: Self-pay

## 2019-03-26 ENCOUNTER — Ambulatory Visit (INDEPENDENT_AMBULATORY_CARE_PROVIDER_SITE_OTHER): Payer: Medicare Other

## 2019-03-26 ENCOUNTER — Encounter: Payer: Self-pay | Admitting: *Deleted

## 2019-03-26 DIAGNOSIS — Z23 Encounter for immunization: Secondary | ICD-10-CM

## 2019-03-27 NOTE — Anesthesia Preprocedure Evaluation (Addendum)
Anesthesia Evaluation  Patient identified by MRN, date of birth, ID band Patient awake    Reviewed: Allergy & Precautions, NPO status , Patient's Chart, lab work & pertinent test results  History of Anesthesia Complications Negative for: history of anesthetic complications  Airway Mallampati: II  TM Distance: >3 FB Neck ROM: Limited    Dental   Pulmonary former smoker,    breath sounds clear to auscultation       Cardiovascular hypertension, (-) angina+ CAD (LHC (2009) showed 1 vessel CAD involving the prox ramus w/ 70% stenosis, not ammenable to stenting, managed medically)  (-) DOE + dysrhythmias (LBBB)  Rhythm:Regular Rate:Normal   HLD  TTE (07/2015): LV size normal. Mild focal basal & mild concentric hypertrophy of septum. LV systolic fn normal. LVEF ~16-10%. Anteroseptal wall motion hypokinesis, possibly secondary to LBBB. Grade I diastolic dysfunction. Mild MR LA mildly dilated PA systolic P mildly elevated w/ peak P 42 mmHg   Neuro/Psych  Headaches,    GI/Hepatic neg GERD  ,  Endo/Other  diabetes, Type 2  Renal/GU Renal disease (Kidney stones)     Musculoskeletal  (+) Arthritis ,   Abdominal   Peds  Hematology   Anesthesia Other Findings  Skin cancer  Reproductive/Obstetrics                            Anesthesia Physical Anesthesia Plan  ASA: III  Anesthesia Plan: MAC   Post-op Pain Management:    Induction: Intravenous  PONV Risk Score and Plan: 1 and TIVA and Midazolam  Airway Management Planned: Nasal Cannula  Additional Equipment:   Intra-op Plan:   Post-operative Plan:   Informed Consent: I have reviewed the patients History and Physical, chart, labs and discussed the procedure including the risks, benefits and alternatives for the proposed anesthesia with the patient or authorized representative who has indicated his/her understanding and acceptance.        Plan Discussed with: CRNA and Anesthesiologist  Anesthesia Plan Comments:         Anesthesia Quick Evaluation    Active Ambulatory Problems    Diagnosis Date Noted  . Diabetes mellitus type 2, controlled, with complications ([place])   . Hx of migraines   . BPH with obstruction/lower urinary tract symptoms   . HTN (hypertension)   . HLD (hyperlipidemia)   . CAD (coronary artery disease)   . Osteopenia   . Vitamin D deficiency 02/02/2012  . Hypotestosteronism 05/09/2012  . Medicare annual wellness visit, subsequent 09/10/2012  . Transaminitis 05/18/2013  . Skin lump of leg 09/16/2013  . Osteoarthritis 01/26/2014  . Primary localized osteoarthrosis of shoulder region 01/26/2014  . Left bundle branch block 07/26/2014  . Advanced care planning/counseling discussion 09/20/2014  . Health maintenance examination 09/20/2014  . TMJ tenderness, right 06/13/2017  . Peroneal tendon tear 08/04/2017  . Primary localized osteoarthritis of left knee 10/02/2018   Resolved Ambulatory Problems    Diagnosis Date Noted  . Cough 08/01/2012  . Abdominal discomfort 02/11/2013  . Preoperative clearance 12/18/2013  . Nausea with vomiting 07/20/2014  . Dizziness 07/20/2014  . Finger laceration 02/24/2016   Past Medical History:  Diagnosis Date  . BPH (benign prostatic hypertrophy)   . Cancer ([place])   . DDD (degenerative disc disease), lumbar   . Diabetes type 2, uncontrolled ([place]) 1990s  . Headache(784.0)   . History of adenomatous polyp of colon 10/2012  . History of nephrolithiasis 2012  . Osteoarthritis of  [place] joint of thumb 2014  . Osteoarthritis of right shoulder 2015  . Wears hearing aid in both ears     CBC    Component Value Date/Time   WBC 5.4 07/12/2017 0758   RBC 4.55 07/12/2017 0758   HGB 14.4 07/12/2017 0758   HCT 42.8 07/12/2017 0758   PLT 234.0 07/12/2017 0758   MCV 93.9 07/12/2017 0758   MCH 30.8 01/27/2014 0616   MCHC 33.6 07/12/2017 0758   RDW 12.8  07/12/2017 0758   LYMPHSABS 1.0 07/12/2017 0758   MONOABS 0.7 07/12/2017 0758   EOSABS 0.3 07/12/2017 0758   BASOSABS 0.0 07/12/2017 0758    CMP     Component Value Date/Time   NA 138 02/26/2019 0815   NA 142 09/03/2007   K 4.5 02/26/2019 0815   K 4.2 09/03/2007   CL 104 02/26/2019 0815   CO2 28 02/26/2019 0815   GLUCOSE 140 (H) 02/26/2019 0815   BUN 30 (H) 02/26/2019 0815   CREATININE 0.77 02/26/2019 0815   CREATININE 0.69 (L) 09/15/2015 0813   CALCIUM 9.4 02/26/2019 0815   PROT 6.6 02/26/2019 0815   ALBUMIN 4.6 02/26/2019 0815   AST 18 02/26/2019 0815   ALT 16 02/26/2019 0815   ALKPHOS 69 02/26/2019 0815   BILITOT 0.8 02/26/2019 0815   GFRNONAA >90 01/27/2014 0616   GFRAA >90 01/27/2014 0616    COAGS    Component Value Date/Time   INR 1.0 07/12/2017 0758    I have seen and consented the patient, Norman Marks. I have answered all of his questions regarding anesthesia. he is appropriately NPO.   Josephina Shih, MD Anesthesia

## 2019-04-02 ENCOUNTER — Other Ambulatory Visit
Admission: RE | Admit: 2019-04-02 | Discharge: 2019-04-02 | Disposition: A | Discharge status: Home / Self Care | Payer: Medicare Other | Source: Ambulatory Visit | Attending: Ophthalmology | Admitting: Ophthalmology

## 2019-04-02 ENCOUNTER — Other Ambulatory Visit: Payer: Self-pay

## 2019-04-02 DIAGNOSIS — Z20828 Contact with and (suspected) exposure to other viral communicable diseases: Secondary | ICD-10-CM | POA: Diagnosis not present

## 2019-04-02 DIAGNOSIS — Z01812 Encounter for preprocedural laboratory examination: Secondary | ICD-10-CM | POA: Insufficient documentation

## 2019-04-02 NOTE — Discharge Instructions (Signed)

## 2019-04-03 LAB — SARS CORONAVIRUS 2 (TAT 6-24 HRS): SARS Coronavirus 2: NEGATIVE

## 2019-04-06 ENCOUNTER — Ambulatory Visit: Payer: Medicare Other | Admitting: Anesthesiology

## 2019-04-06 ENCOUNTER — Encounter
Admission: RE | Disposition: A | Discharge status: Home / Self Care | Payer: Self-pay | Source: Home / Self Care | Attending: Ophthalmology

## 2019-04-06 ENCOUNTER — Ambulatory Visit
Admission: RE | Admit: 2019-04-06 | Discharge: 2019-04-06 | Disposition: A | Discharge status: Home / Self Care | Payer: Medicare Other | Attending: Ophthalmology | Admitting: Ophthalmology

## 2019-04-06 DIAGNOSIS — I10 Essential (primary) hypertension: Secondary | ICD-10-CM | POA: Insufficient documentation

## 2019-04-06 DIAGNOSIS — E1136 Type 2 diabetes mellitus with diabetic cataract: Secondary | ICD-10-CM | POA: Diagnosis not present

## 2019-04-06 DIAGNOSIS — Z96611 Presence of right artificial shoulder joint: Secondary | ICD-10-CM | POA: Insufficient documentation

## 2019-04-06 DIAGNOSIS — Z7982 Long term (current) use of aspirin: Secondary | ICD-10-CM | POA: Insufficient documentation

## 2019-04-06 DIAGNOSIS — H2512 Age-related nuclear cataract, left eye: Secondary | ICD-10-CM | POA: Diagnosis present

## 2019-04-06 DIAGNOSIS — Z87891 Personal history of nicotine dependence: Secondary | ICD-10-CM | POA: Insufficient documentation

## 2019-04-06 DIAGNOSIS — Z794 Long term (current) use of insulin: Secondary | ICD-10-CM | POA: Diagnosis not present

## 2019-04-06 DIAGNOSIS — E7800 Pure hypercholesterolemia, unspecified: Secondary | ICD-10-CM | POA: Diagnosis not present

## 2019-04-06 DIAGNOSIS — E785 Hyperlipidemia, unspecified: Secondary | ICD-10-CM | POA: Insufficient documentation

## 2019-04-06 DIAGNOSIS — I2510 Atherosclerotic heart disease of native coronary artery without angina pectoris: Secondary | ICD-10-CM | POA: Insufficient documentation

## 2019-04-06 DIAGNOSIS — Z79899 Other long term (current) drug therapy: Secondary | ICD-10-CM | POA: Insufficient documentation

## 2019-04-06 HISTORY — PX: CATARACT EXTRACTION W/PHACO: SHX586

## 2019-04-06 LAB — GLUCOSE, CAPILLARY
Glucose-Capillary: 198 mg/dL — ABNORMAL HIGH (ref 70–99)
Glucose-Capillary: 191 mg/dL — ABNORMAL HIGH (ref 70–99)

## 2019-04-06 SURGERY — PHACOEMULSIFICATION, CATARACT, WITH IOL INSERTION
Anesthesia: Monitor Anesthesia Care | Site: Eye | Laterality: Left

## 2019-04-06 MED ORDER — LACTATED RINGERS IV SOLN: 100.0000 mL/h | INTRAVENOUS | Status: DC

## 2019-04-06 MED ORDER — FENTANYL CITRATE (PF) 100 MCG/2ML IJ SOLN
INTRAMUSCULAR | Status: DC | PRN
  Administered 2019-04-06: 50 ug via INTRAVENOUS

## 2019-04-06 MED ORDER — LIDOCAINE HCL (PF) 2 % IJ SOLN
INTRAOCULAR | Status: DC | PRN
  Administered 2019-04-06: 09:00:00 2 mL via INTRAOCULAR

## 2019-04-06 MED ORDER — SODIUM HYALURONATE 10 MG/ML IO SOLN
INTRAOCULAR | Status: DC | PRN
  Administered 2019-04-06: 0.55 mL via INTRAOCULAR

## 2019-04-06 MED ORDER — EPINEPHRINE PF 1 MG/ML IJ SOLN
INTRAOCULAR | Status: DC | PRN
  Administered 2019-04-06: 09:00:00 88 mL via OPHTHALMIC

## 2019-04-06 MED ORDER — TETRACAINE HCL 0.5 % OP SOLN
1.0000 [drp] | OPHTHALMIC | Status: DC | PRN
  Administered 2019-04-06 (×3): 1 [drp] via OPHTHALMIC

## 2019-04-06 MED ORDER — ONDANSETRON HCL 4 MG/2ML IJ SOLN: 4.0000 mg | Freq: Once | INTRAMUSCULAR | Status: DC | PRN

## 2019-04-06 MED ORDER — MOXIFLOXACIN HCL 0.5 % OP SOLN
OPHTHALMIC | Status: DC | PRN
  Administered 2019-04-06: 0.2 mL via OPHTHALMIC

## 2019-04-06 MED ORDER — ARMC OPHTHALMIC DILATING DROPS
1.0000 "application " | OPHTHALMIC | Status: DC | PRN
  Administered 2019-04-06 (×3): 1 via OPHTHALMIC

## 2019-04-06 MED ORDER — MIDAZOLAM HCL 2 MG/2ML IJ SOLN
INTRAMUSCULAR | Status: DC | PRN
  Administered 2019-04-06: 2 mg via INTRAVENOUS

## 2019-04-06 MED ORDER — SODIUM HYALURONATE 23 MG/ML IO SOLN
INTRAOCULAR | Status: DC | PRN
  Administered 2019-04-06: 0.6 mL via INTRAOCULAR

## 2019-04-06 MED ORDER — ACETAMINOPHEN 10 MG/ML IV SOLN: 1000.0000 mg | Freq: Once | INTRAVENOUS | Status: DC | PRN

## 2019-04-06 SURGICAL SUPPLY — 19 items
CANNULA ANT/CHMB 27G (MISCELLANEOUS) ×2 IMPLANT
CANNULA ANT/CHMB 27GA (MISCELLANEOUS) ×6 IMPLANT
DISSECTOR HYDRO NUCLEUS 50X22 (MISCELLANEOUS) ×3 IMPLANT
GLOVE SURG LX 7.5 STRW (GLOVE) ×2
GLOVE SURG LX STRL 7.5 STRW (GLOVE) ×1 IMPLANT
GLOVE SURG SYN 8.5  E (GLOVE) ×2
GLOVE SURG SYN 8.5 E (GLOVE) ×1 IMPLANT
GLOVE SURG SYN 8.5 PF PI (GLOVE) ×1 IMPLANT
GOWN STRL REUS W/ TWL LRG LVL3 (GOWN DISPOSABLE) ×2 IMPLANT
GOWN STRL REUS W/TWL LRG LVL3 (GOWN DISPOSABLE) ×4
LENS IOL TECNIS ITEC 22.5 (Intraocular Lens) ×2 IMPLANT
MARKER SKIN DUAL TIP RULER LAB (MISCELLANEOUS) ×3 IMPLANT
PACK DR. KING ARMS (PACKS) ×3 IMPLANT
PACK EYE AFTER SURG (MISCELLANEOUS) ×3 IMPLANT
PACK OPTHALMIC (MISCELLANEOUS) ×3 IMPLANT
SYR 3ML LL SCALE MARK (SYRINGE) ×3 IMPLANT
SYR TB 1ML LUER SLIP (SYRINGE) ×3 IMPLANT
WATER STERILE IRR 250ML POUR (IV SOLUTION) ×3 IMPLANT
WIPE NON LINTING 3.25X3.25 (MISCELLANEOUS) ×3 IMPLANT

## 2019-04-06 NOTE — Op Note (Signed)
OPERATIVE NOTE  KAINALU LADY [id number] 04/06/2019   PREOPERATIVE DIAGNOSIS:  Nuclear sclerotic cataract left eye.  H25.12   POSTOPERATIVE DIAGNOSIS:    Nuclear sclerotic cataract left eye.     PROCEDURE:  Phacoemusification with posterior chamber intraocular lens placement of the left eye   LENS:   Implant Name Type Inv. Item Serial No. Manufacturer Lot No. LRB No. Used Action  LENS IOL DIOP 22.5 - [id number] Intraocular Lens LENS IOL DIOP 22.5 [id number]  Left 1 Implanted      Procedure(s) with comments: CATARACT EXTRACTION PHACO AND INTRAOCULAR LENS PLACEMENT (IOC) LEFT DIABETIC 01:04.2     11.5%      8.58 (Left) - Diabetic - insulin and oral meds    SURGEON:  Benay Pillow, MD, MPH   ANESTHESIA:  Topical with tetracaine drops augmented with 1% preservative-free intracameral lidocaine.  ESTIMATED BLOOD LOSS: <1 mL   COMPLICATIONS:  None.   DESCRIPTION OF PROCEDURE:  The patient was identified in the holding room and transported to the operating room and placed in the supine position under the operating microscope.  The left eye was identified as the operative eye and it was prepped and draped in the usual sterile ophthalmic fashion.   A 1.0 millimeter clear-corneal paracentesis was made at the 5:00 position. 0.5 ml of preservative-free 1% lidocaine with epinephrine was injected into the anterior chamber.  The anterior chamber was filled with Healon 5 viscoelastic.  A 2.4 millimeter keratome was used to make a near-clear corneal incision at the 2:00 position.  A curvilinear capsulorrhexis was made with a cystotome and capsulorrhexis forceps.  Balanced salt solution was used to hydrodissect and hydrodelineate the nucleus.   Phacoemulsification was then used in stop and chop fashion to remove the lens nucleus and epinucleus.  The remaining cortex was then removed using the irrigation and aspiration handpiece. Healon was then placed into the capsular bag to distend it for lens  placement.  A lens was then injected into the capsular bag.  The remaining viscoelastic was aspirated.   Wounds were hydrated with balanced salt solution.  The anterior chamber was inflated to a physiologic pressure with balanced salt solution.  Intracameral vigamox 0.1 mL undiltued was injected into the eye and a drop placed onto the ocular surface.  No wound leaks were noted.  The patient was taken to the recovery room in stable condition without complications of anesthesia or surgery  There was a small residual posterior capsular fibrosis which could not be removed despite significant polishing attempts.    Benay Pillow 04/06/2019, 9:23 AM

## 2019-04-06 NOTE — Anesthesia Postprocedure Evaluation (Signed)
Anesthesia Post Note  Patient: Norman Marks  Procedure(s) Performed: CATARACT EXTRACTION PHACO AND INTRAOCULAR LENS PLACEMENT (IOC) LEFT DIABETIC 01:04.2     11.5%      8.58 (Left Eye)  Patient location during evaluation: PACU Anesthesia Type: MAC Level of consciousness: awake and alert Pain management: pain level controlled Vital Signs Assessment: post-procedure vital signs reviewed and stable Respiratory status: spontaneous breathing, nonlabored ventilation, respiratory function stable and patient connected to nasal cannula oxygen Cardiovascular status: stable and blood pressure returned to baseline Postop Assessment: no apparent nausea or vomiting Anesthetic complications: no    Shomari Matusik A  Makari Portman

## 2019-04-06 NOTE — H&P (Signed)

## 2019-04-06 NOTE — Transfer of Care (Signed)
Immediate Anesthesia Transfer of Care Note  Patient: Norman Marks  Procedure(s) Performed: CATARACT EXTRACTION PHACO AND INTRAOCULAR LENS PLACEMENT (IOC) LEFT DIABETIC 01:04.2     11.5%      8.58 (Left Eye)  Patient Location: PACU  Anesthesia Type: MAC  Level of Consciousness: awake, alert  and patient cooperative  Airway and Oxygen Therapy: Patient Spontanous Breathing and Patient connected to supplemental oxygen  Post-op Assessment: Post-op Vital signs reviewed, Patient's Cardiovascular Status Stable, Respiratory Function Stable, Patent Airway and No signs of Nausea or vomiting  Post-op Vital Signs: Reviewed and stable  Complications: No apparent anesthesia complications

## 2019-04-07 ENCOUNTER — Encounter: Payer: Self-pay | Admitting: Ophthalmology

## 2019-04-13 ENCOUNTER — Other Ambulatory Visit: Payer: Self-pay | Admitting: Family Medicine

## 2019-05-06 ENCOUNTER — Other Ambulatory Visit: Payer: Self-pay | Admitting: Dermatology

## 2019-06-01 ENCOUNTER — Other Ambulatory Visit: Payer: Self-pay | Admitting: Family Medicine

## 2019-06-18 ENCOUNTER — Other Ambulatory Visit: Payer: Self-pay | Admitting: Dermatology

## 2019-06-29 NOTE — Progress Notes (Signed)
Office Visit    Patient Name: Norman Marks Date of Encounter: 06/30/2019  Primary Care Provider:  Ria Bush, MD Primary Cardiologist:  Kathlyn Sacramento, MD Electrophysiologist:  None   Chief Complaint    Norman Marks is a 73 y.o. male with a hx of CAD, LBBB, DM2, HTN, HLD, migraines, OA, BPH presents today for annual follow up of CAD.  Past Medical History    Past Medical History:  Diagnosis Date  . BPH (benign prostatic hypertrophy)   . CAD (coronary artery disease) 2009   [name] (cardiolite 2009 with 70% stenosis of proximal ramus, not amenable to PCI, rec treat medically) --> [name]  . Cancer ([place])    basal cell skin cancer  . DDD (degenerative disc disease), lumbar    s/p surgery  . Diabetes type 2, uncontrolled ([place]) 1990s   referred to [facility] 09/2015  . Headache(784.0)   . History of adenomatous polyp of colon 10/2012  . History of nephrolithiasis 2012   L ureter, R renal pelvis  . HLD (hyperlipidemia)   . HTN (hypertension)    mild  . Hx of migraines   . Left bundle branch block   . Osteoarthritis of [place] joint of thumb 2014   s/p injections ([place])  . Osteoarthritis of right shoulder 2015   s/p injections ([place])  . Osteopenia 06/2011   T score -3, completed 2 yrs forteo, T score improved to -2.2 (2015)  . Vitamin D deficiency   . Wears hearing aid in both ears    Past Surgical History:  Procedure Laterality Date  . [place], 2008   lumbar; x 3, spinal fusion  . CARDIAC CATHETERIZATION  2009   70% stenosis ostium  . CARDIOVASCULAR STRESS TEST  2011   fixed defect inferior/septal walls without reversibility, LVEF 43% post stress  . CATARACT EXTRACTION W/PHACO Right 03/09/2019   Procedure: CATARACT EXTRACTION PHACO AND INTRAOCULAR LENS PLACEMENT ([place]) RIGHT DIABETES;  Surgeon: Eulogio Bear, MD;  Location: [place];  Service: Ophthalmology;  Laterality: Right;  Diabetic - insulin and oral meds  . CATARACT EXTRACTION  W/PHACO Left 04/06/2019   Procedure: CATARACT EXTRACTION PHACO AND INTRAOCULAR LENS PLACEMENT (IOC) LEFT DIABETIC 01:04.2     11.5%      8.58;  Surgeon: Eulogio Bear, MD;  Location: [place];  Service: Ophthalmology;  Laterality: Left;  Diabetic - insulin and oral meds  . COLONOSCOPY  10/2012   tubular adenoma, mild diverticulosis, rpt 5 yrs Deatra Ina)  . COLONOSCOPY  12/2017   4 polyps - SSP, TA, diverticulosis ([place])  . dexa  06/2011   osteoporosis T -2.6 spine, -1.9 hip  . dexa  07/2013   osteopenia T -2.2 femur, -1.2 spine  . FLEXIBLE SIGMOIDOSCOPY  2007   pt reported normal ([place])  . FOOT SURGERY Left 08/2017  . HAND SURGERY Left 10/2015   [place] arthritis repair [facility] Silos)  . KNEE SURGERY Right   . POLYPECTOMY    . SHOULDER SURGERY Right   . SKIN SURGERY  2006   skin cancer  . TENDON REPAIR  08/2017  . TOTAL SHOULDER ARTHROPLASTY Right 01/26/2014   Procedure: TOTAL SHOULDER ARTHROPLASTY;  Surgeon: Johnny Bridge, MD;  Location: [place];  Service: Orthopedics;  Laterality: Right;  . US ECHOCARDIOGRAPHY  10/2008   nl LV fxn, EF 50%, mild MR, mild pulm HTN    Allergies  Allergies  Allergen Reactions  . Metformin And Related Diarrhea  With higher doses, tolerating QD dosing well  . Percocet [Oxycodone-Acetaminophen] Itching    History of Present Illness    Norman Marks is a 73 y.o. male with a hx of CAD, LBBB, DM2, HTN, HLD, migraines, OA, BPH last seen 06/10/18 by Christell Faith, PA. Presents today for annual follow up of CAD.  Cardiac cath 2009 with 1 vessel CAD involving prox ramus with 70% stenosis managed medically.  Follow-up as nuclear stress testing 12/2013 showed fixed anterior septal and inferior septal defect with normal EF.  Deficits felt to be secondary to LBBB.  Echo 07/2015 EF 50 to 55%, and grade 1 diastolic dysfunction, mildly dilated LA, mild MR, mild pulmonary hypertension.  He reports feeling well.  Tells me during the coronavirus pandemic he  has been working on remodeling his kitchen and is enjoying seeing the project come together.  Takes his medications regularly.  Tells me he enjoys being active around the home.  Endorses eating a heart healthy diet.  Has not been checking his blood pressure regularly at home but reports that when he was previously checking it was less than 140.  He does have an arm cuff and is willing to start checking regularly.  Reports his blood sugars have been well controlled at home.  Today he denies chest pain, pressure, tightness.  We discussed the utilization of as needed nitroglycerin and he does not feel he needs a prescription at this time.  He reports no shortness of breath at rest, orthopnea, PND.  He does endorse that he gets mildly DOE when walking outdoors in the cold.  Tells me this self resolves and he is not concerned about it.  He reports no lower extremity edema.  He denies dizziness, lightheadedness, near syncope, syncope.  EKGs/Labs/Other Studies Reviewed:   The following studies were reviewed today:  Echo 07/2015 - Left ventricle: The cavity size was normal. There was mild focal   basal and mild concentric hypertrophy of the septum. Systolic   function was normal. The estimated ejection fraction was in the   range of 50% to 55%. Anteroseptal wall motion hypokinesis noted,   unable to exclude dyskinesis, possibly secondary to bundle branch   block. Doppler parameters are consistent with abnormal left   ventricular relaxation (grade 1 diastolic dysfunction). - Mitral valve: There was mild regurgitation. - Left atrium: The atrium was mildly dilated. - Right ventricle: Systolic function was normal. - Pulmonary arteries: Systolic pressure was mildly elevated. PA   peak pressure: 42 mm Hg (S). Impressions: - Murmur possibly secondary to proximal septal thickening. No   significant outflow tract gradient.  EKG:  EKG is ordered today.  The ekg ordered today demonstrates sinus rhythm with  left axis deviation and LBBB  Recent Labs: 02/26/2019: ALT 16; BUN 30; Creatinine, Ser 0.77; Potassium 4.5; Sodium 138  Recent Lipid Panel    Component Value Date/Time   CHOL 118 02/26/2019 0815   TRIG 75.0 02/26/2019 0815   HDL 39.00 (L) 02/26/2019 0815   CHOLHDL 3 02/26/2019 0815   VLDL 15.0 02/26/2019 0815   LDLCALC 64 02/26/2019 0815   LDLDIRECT 65.0 01/23/2016 0931    Home Medications   Current Meds  Medication Sig  . aspirin 81 MG tablet Take 81 mg by mouth daily.  . carvedilol (COREG) 25 MG tablet TAKE 1 TABLET BY MOUTH TWICE (2) DAILY WITH A MEAL  . Insulin Pen Needle (EXEL PEN NEEDLES 31GX1/4") 31G X 6 MM MISC Use as directed  . JANUMET  XR [id number] MG TB24 TAKE 1 TABLET BY MOUTH ONCE A DAY  . LANTUS 100 UNIT/ML injection INJECT 0.4 ML (40 UNITS TOTAL) INTO THE SKIN AT BEDTIME  . lovastatin (MEVACOR) 40 MG tablet TAKE 1 TABLET BY MOUTH EVERY NIGHT AT BEDTIME  . ONE TOUCH ULTRA TEST test strip CHECK BLOOD SUGAR ONCE OR TWICE DAILY ASDIRECTED  . ramipril (ALTACE) 10 MG capsule TAKE 1 CAPSULE BY MOUTH ONCE DAILY  . TRUEPLUS INSULIN SYRINGE 31G X 5/16" 1 ML MISC USE TO INJECT INSULIN AS DIRECTED      Review of Systems   Review of Systems  Constitution: Negative for chills, fever and malaise/fatigue.  Cardiovascular: Negative for chest pain, dyspnea on exertion, irregular heartbeat, leg swelling, near-syncope and palpitations.  Respiratory: Negative for cough, shortness of breath and wheezing.   Gastrointestinal: Negative for nausea and vomiting.  Neurological: Negative for dizziness, light-headedness and weakness.   All other systems reviewed and are otherwise negative except as noted above.  Physical Exam    VS:  BP (!) 154/78 (BP Location: Left Arm, Patient Position: Sitting, Cuff Size: Normal)   Pulse 77   Ht 5\' 9"  (1.753 m)   Wt 169 lb 8 oz (76.9 kg)   SpO2 98%   BMI 25.03 kg/m  , BMI Body mass index is 25.03 kg/m. GEN: Well nourished, well developed, in no  acute distress. HEENT: normal. Neck: Supple, no JVD, carotid bruits, or masses. Cardiac: RRR, no murmurs, rubs, or gallops. No clubbing, cyanosis, edema.  Radials/DP/PT 2+ and equal bilaterally.  Respiratory:  Respirations regular and unlabored, clear to auscultation bilaterally. GI: Soft, nontender, nondistended, BS + x 4. MS: No deformity or atrophy. Skin: Warm and dry, no rash. Neuro:  Strength and sensation are intact. Psych: Normal affect.  Accessory Clinical Findings    ECG personally reviewed by me today -sinus rhythm rate 77 bpm with left axis deviation and LBBB- no acute changes.  Assessment & Plan    1. CAD - Stable with no anginal symptoms.  EKG today sinus rhythm 77 bpm, known LBBB with no acute ST/T wave changes.  Continue guideline directed therapy of aspirin, beta-blocker, statin.  No indication for ischemic evaluation at this time.  2. LBBB - Echo 2017 normal LVEF.  He denies lightheadedness, dizziness, near-syncope-she was educated to report the symptoms.  Continue to monitor with EKG annually.  3. HTN -BP elevated today but reports it is normally well controlled.  He will check his blood pressure 3 times a week for 2 weeks and report blood pressure consistently greater than 130/80.  If he requires additional antihypertensive therapy consider up titration of ramipril versus addition of low-dose ARB.  4. HLD, LDL goal less than 70-lipid panel 02/26/2019 total cholesterol 118, HDL 39, LDL 64, triglycerides 75.  Liver enzymes normal 02/26/2019.  Lipids at goal.  Continue present statin.  5. DM2 - Follows with PCP. 02/26/19 A1c 7.2. If additional agent needed, consider SGLT2i for cardioprotective benefit.  Disposition: Follow up in 1 year(s) with Dr. Fletcher Anon or APP  Loel Dubonnet, NP 06/30/2019, 8:44 AM

## 2019-06-30 ENCOUNTER — Ambulatory Visit (INDEPENDENT_AMBULATORY_CARE_PROVIDER_SITE_OTHER): Payer: Medicare Other | Admitting: Family

## 2019-06-30 ENCOUNTER — Other Ambulatory Visit: Payer: Self-pay

## 2019-06-30 ENCOUNTER — Encounter: Payer: Self-pay | Admitting: Family

## 2019-06-30 VITALS — BP 154/78 | HR 77 | Ht 69.0 in | Wt 169.5 lb

## 2019-06-30 DIAGNOSIS — I447 Left bundle-branch block, unspecified: Secondary | ICD-10-CM

## 2019-06-30 DIAGNOSIS — I10 Essential (primary) hypertension: Secondary | ICD-10-CM

## 2019-06-30 DIAGNOSIS — E785 Hyperlipidemia, unspecified: Secondary | ICD-10-CM | POA: Diagnosis not present

## 2019-06-30 DIAGNOSIS — I2510 Atherosclerotic heart disease of native coronary artery without angina pectoris: Secondary | ICD-10-CM | POA: Diagnosis not present

## 2019-06-30 NOTE — Patient Instructions (Addendum)
Medication Instructions:  No medication changes today.  *If you need a refill on your cardiac medications before your next appointment, please call your pharmacy*  Lab Work: No lab work today.  If you have labs (blood work) drawn today and your tests are completely normal, you will receive your results only by: Marland Kitchen MyChart Message (if you have MyChart) OR . A paper copy in the mail If you have any lab test that is abnormal or we need to change your treatment, we will call you to review the results.  Testing/Procedures: You had an EKG today. It showed sinus rhythm.  Follow-Up: At [facility], you and your health needs are our priority.  As part of our continuing mission to provide you with exceptional heart care, we have created designated Provider Care Teams.  These Care Teams include your primary Cardiologist (physician) and Advanced Practice Providers (APPs -  Physician Assistants and Nurse Practitioners) who all work together to provide you with the care you need, when you need it.  Your next appointment:   1 year(s)  The format for your next appointment:   In Person  Provider:    You may see Kathlyn Sacramento, MD or one of the following Advanced Practice Providers on your designated Care Team:    Murray Hodgkins, NP  Christell Faith, PA-C  Marrianne Mood, PA-C   Other Instructions   Please check your blood pressure three times per week and keep a log for 2 weeks. If your blood pressure is consistently more than 130/80 please call our office or [name].   Tips to Measure your Blood Pressure Correctly  To determine whether you have hypertension, a medical professional will take a blood pressure reading. How you prepare for the test, the position of your arm, and other factors can change a blood pressure reading by 10% or more. That could be enough to hide high blood pressure, start you on a drug you don't really need, or lead your doctor to incorrectly adjust your  medications.  National and international guidelines offer specific instructions for measuring blood pressure. If a doctor, nurse, or medical assistant isn't doing it right, don't hesitate to ask him or her to get with the guidelines.  Here's what you can do to ensure a correct reading: . Don't drink a caffeinated beverage or smoke during the 30 minutes before the test. . Sit quietly for five minutes before the test begins. . During the measurement, sit in a chair with your feet on the floor and your arm supported so your elbow is at about heart level. . The inflatable part of the cuff should completely cover at least 80% of your upper arm, and the cuff should be placed on bare skin, not over a shirt. . Don't talk during the measurement. . Have your blood pressure measured twice, with a brief break in between. If the readings are different by 5 points or more, have it done a third time.  There are times to break these rules. If you sometimes feel lightheaded when getting out of bed in the morning or when you stand after sitting, you should have your blood pressure checked while seated and then while standing to see if it falls from one position to the next.  Because blood pressure varies throughout the day, your doctor will rarely diagnose hypertension on the basis of a single reading. Instead, he or she will want to confirm the measurements on at least two occasions, usually within a few  weeks of one another. The exception to this rule is if you have a blood pressure reading of 180/110 mm Hg or higher. A result this high usually calls for prompt treatment.  It's a good idea to have your blood pressure measured in both arms at least once, since the reading in one arm (usually the right) may be higher than that in the left. A 2014 study in The American Journal of Medicine of nearly 3,400 people found average arm- to-arm differences in systolic blood pressure of about 5 points. The higher number should  be used to make treatment decisions.  In 2017, new guidelines from the [employer], the [employer] of Cardiology, and nine other health organizations lowered the diagnosis of high blood pressure to 130/80 mm Hg or higher for all adults. The guidelines also redefined the various blood pressure categories to now include normal, elevated, Stage 1 hypertension, Stage 2 hypertension, and hypertensive crisis (see "Blood pressure categories").  Blood pressure categories  Blood pressure category SYSTOLIC (upper number)  DIASTOLIC (lower number)  Normal Less than 120 mm Hg and Less than 80 mm Hg  Elevated 120-129 mm Hg and Less than 80 mm Hg  High blood pressure: Stage 1 hypertension 130-139 mm Hg or 80-89 mm Hg  High blood pressure: Stage 2 hypertension 140 mm Hg or higher or 90 mm Hg or higher  Hypertensive crisis (consult your doctor immediately) Higher than 180 mm Hg and/or Higher than 120 mm Hg  Source: American Heart Association and American Stroke Association. For more on getting your blood pressure under control, buy Controlling Your Blood Pressure, a Special Health Report from [facility].   Blood Pressure Log   Date   Time  Blood Pressure  Position  Example: Nov 1 9 AM 124/78 sitting

## 2019-07-13 ENCOUNTER — Other Ambulatory Visit: Payer: Self-pay | Admitting: Family Medicine

## 2019-07-22 ENCOUNTER — Other Ambulatory Visit: Payer: Self-pay | Admitting: Dermatology

## 2019-07-22 ENCOUNTER — Other Ambulatory Visit: Payer: Self-pay | Admitting: Family Medicine

## 2019-08-03 ENCOUNTER — Telehealth: Payer: Self-pay | Admitting: Family

## 2019-08-03 NOTE — Telephone Encounter (Signed)
Called to check on blood pressure at home.   Blood pressure at home checked intermittently. Reports BP readings are [id number] systolic. Reassures me that he "feels fine". No headache, no instances of hypotension.   He is very hesitant to increase his Ramipril dose to 20mg  when discussed. Discussed the importance of BP management. Tells me he will check his blood pressure more regularly and keep a log. Tells me he will call us for a blood pressure consistently more than 130/80.   Loel Dubonnet, NP

## 2019-08-07 ENCOUNTER — Emergency Department
Admission: EM | Admit: 2019-08-07 | Discharge: 2019-08-07 | Disposition: A | Discharge status: Home / Self Care | Payer: Medicare Other | Attending: Emergency Medicine | Admitting: Emergency Medicine

## 2019-08-07 ENCOUNTER — Other Ambulatory Visit: Payer: Self-pay

## 2019-08-07 ENCOUNTER — Emergency Department: Payer: Medicare Other

## 2019-08-07 ENCOUNTER — Encounter: Payer: Self-pay | Admitting: Emergency Medicine

## 2019-08-07 DIAGNOSIS — Z7982 Long term (current) use of aspirin: Secondary | ICD-10-CM | POA: Insufficient documentation

## 2019-08-07 DIAGNOSIS — Z79899 Other long term (current) drug therapy: Secondary | ICD-10-CM | POA: Insufficient documentation

## 2019-08-07 DIAGNOSIS — Z794 Long term (current) use of insulin: Secondary | ICD-10-CM | POA: Diagnosis not present

## 2019-08-07 DIAGNOSIS — Y929 Unspecified place or not applicable: Secondary | ICD-10-CM | POA: Insufficient documentation

## 2019-08-07 DIAGNOSIS — I2510 Atherosclerotic heart disease of native coronary artery without angina pectoris: Secondary | ICD-10-CM | POA: Insufficient documentation

## 2019-08-07 DIAGNOSIS — Z96611 Presence of right artificial shoulder joint: Secondary | ICD-10-CM | POA: Diagnosis not present

## 2019-08-07 DIAGNOSIS — Z87891 Personal history of nicotine dependence: Secondary | ICD-10-CM | POA: Diagnosis not present

## 2019-08-07 DIAGNOSIS — Y999 Unspecified external cause status: Secondary | ICD-10-CM | POA: Insufficient documentation

## 2019-08-07 DIAGNOSIS — Y9389 Activity, other specified: Secondary | ICD-10-CM | POA: Insufficient documentation

## 2019-08-07 DIAGNOSIS — W294XXA Contact with nail gun, initial encounter: Secondary | ICD-10-CM | POA: Insufficient documentation

## 2019-08-07 DIAGNOSIS — E119 Type 2 diabetes mellitus without complications: Secondary | ICD-10-CM | POA: Insufficient documentation

## 2019-08-07 DIAGNOSIS — S61542A Puncture wound with foreign body of left wrist, initial encounter: Secondary | ICD-10-CM | POA: Insufficient documentation

## 2019-08-07 DIAGNOSIS — Z23 Encounter for immunization: Secondary | ICD-10-CM | POA: Diagnosis not present

## 2019-08-07 DIAGNOSIS — S60852A Superficial foreign body of left wrist, initial encounter: Secondary | ICD-10-CM

## 2019-08-07 DIAGNOSIS — I10 Essential (primary) hypertension: Secondary | ICD-10-CM | POA: Insufficient documentation

## 2019-08-07 DIAGNOSIS — Z85828 Personal history of other malignant neoplasm of skin: Secondary | ICD-10-CM | POA: Diagnosis not present

## 2019-08-07 MED ORDER — LIDOCAINE HCL (PF) 1 % IJ SOLN: 5.0000 mL | Freq: Once | INTRAMUSCULAR | Status: DC

## 2019-08-07 MED ORDER — TRAMADOL HCL 50 MG PO TABS: 50.0000 mg | ORAL_TABLET | Freq: Two times a day (BID) | ORAL | 0 refills | Status: AC

## 2019-08-07 MED ORDER — LIDOCAINE HCL (PF) 1 % IJ SOLN
INTRAMUSCULAR | Status: AC
  Filled 2019-08-07: qty 5

## 2019-08-07 MED ORDER — TETANUS-DIPHTH-ACELL PERTUSSIS 5-2.5-18.5 LF-MCG/0.5 IM SUSP
0.5000 mL | Freq: Once | INTRAMUSCULAR | Status: AC
  Administered 2019-08-07: 13:00:00 0.5 mL via INTRAMUSCULAR
  Filled 2019-08-07: qty 0.5

## 2019-08-07 NOTE — Discharge Instructions (Addendum)
Follow discharge care instructions.  Return to ED if condition worsens.

## 2019-08-07 NOTE — ED Triage Notes (Signed)
Presents with f/b in left wrist    Small nail from nail gun

## 2019-08-07 NOTE — ED Provider Notes (Signed)
[facility] Emergency Department Provider Note   ____________________________________________   First MD Initiated Contact with Patient 08/07/19 1201     (approximate)  I have reviewed the triage vital signs and the nursing notes.   HISTORY  Chief Complaint Foreign Body in Skin    HPI Norman Marks is a 74 y.o. male patient presents with foreign body to the left wrist.  Patient was using pneumatic nail gun and accidentally shot a nail into his left wrist.  Patient denies loss sensation or loss of function.  Bleeding is controlled.  Patient is right-hand dominant.  Patient rates pain as 2/10.  Patient described the pain as "sore".  Tetanus shot is not up-to-date.         Past Medical History:  Diagnosis Date  . BPH (benign prostatic hypertrophy)   . CAD (coronary artery disease) 2009   [name] (cardiolite 2009 with 70% stenosis of proximal ramus, not amenable to PCI, rec treat medically) --> [name]  . Cancer ([place])    basal cell skin cancer  . DDD (degenerative disc disease), lumbar    s/p surgery  . Diabetes type 2, uncontrolled ([place]) 1990s   referred to [facility] 09/2015  . Headache(784.0)   . History of adenomatous polyp of colon 10/2012  . History of nephrolithiasis 2012   L ureter, R renal pelvis  . HLD (hyperlipidemia)   . HTN (hypertension)    mild  . Hx of migraines   . Left bundle branch block   . Osteoarthritis of [place] joint of thumb 2014   s/p injections ([place])  . Osteoarthritis of right shoulder 2015   s/p injections ([place])  . Osteopenia 06/2011   T score -3, completed 2 yrs forteo, T score improved to -2.2 (2015)  . Vitamin D deficiency   . Wears hearing aid in both ears     Patient Active Problem List   Diagnosis Date Noted  . Primary localized osteoarthritis of left knee 10/02/2018  . Peroneal tendon tear 08/04/2017  . TMJ tenderness, right 06/13/2017  . Advanced care planning/counseling discussion 09/20/2014  . Health  maintenance examination 09/20/2014  . Left bundle branch block 07/26/2014  . Osteoarthritis 01/26/2014  . Primary localized osteoarthrosis of shoulder region 01/26/2014  . Skin lump of leg 09/16/2013  . Transaminitis 05/18/2013  . Medicare annual wellness visit, subsequent 09/10/2012  . Hypotestosteronism 05/09/2012  . Vitamin D deficiency 02/02/2012  . Diabetes mellitus type 2, controlled, with complications ([place])   . Hx of migraines   . BPH with obstruction/lower urinary tract symptoms   . HTN (hypertension)   . HLD (hyperlipidemia)   . CAD (coronary artery disease)   . Osteopenia     Past Surgical History:  Procedure Laterality Date  . [place], 2008   lumbar; x 3, spinal fusion  . CARDIAC CATHETERIZATION  2009   70% stenosis ostium  . CARDIOVASCULAR STRESS TEST  2011   fixed defect inferior/septal walls without reversibility, LVEF 43% post stress  . CATARACT EXTRACTION W/PHACO Right 03/09/2019   Procedure: CATARACT EXTRACTION PHACO AND INTRAOCULAR LENS PLACEMENT ([place]) RIGHT DIABETES;  Surgeon: Eulogio Bear, MD;  Location: [place];  Service: Ophthalmology;  Laterality: Right;  Diabetic - insulin and oral meds  . CATARACT EXTRACTION W/PHACO Left 04/06/2019   Procedure: CATARACT EXTRACTION PHACO AND INTRAOCULAR LENS PLACEMENT (IOC) LEFT DIABETIC 01:04.2     11.5%      8.58;  Surgeon: Eulogio Bear, MD;  Location: [place];  Service: Ophthalmology;  Laterality: Left;  Diabetic - insulin and oral meds  . COLONOSCOPY  10/2012   tubular adenoma, mild diverticulosis, rpt 5 yrs Deatra Ina)  . COLONOSCOPY  12/2017   4 polyps - SSP, TA, diverticulosis ([place])  . dexa  06/2011   osteoporosis T -2.6 spine, -1.9 hip  . dexa  07/2013   osteopenia T -2.2 femur, -1.2 spine  . FLEXIBLE SIGMOIDOSCOPY  2007   pt reported normal ([place])  . FOOT SURGERY Left 08/2017  . HAND SURGERY Left 10/2015   [place] arthritis repair [place] S[place])  . KNEE SURGERY Right     . POLYPECTOMY    . SHOULDER SURGERY Right   . SKIN SURGERY  2006   skin cancer  . TENDON REPAIR  08/2017  . TOTAL SHOULDER ARTHROPLASTY Right 01/26/2014   Procedure: TOTAL SHOULDER ARTHROPLASTY;  Surgeon: Johnny Bridge, MD;  Location: [place];  Service: Orthopedics;  Laterality: Right;  . US ECHOCARDIOGRAPHY  10/2008   nl LV fxn, EF 50%, mild MR, mild pulm HTN    Prior to Admission medications   Medication Sig Start Date End Date Taking? Authorizing Provider  aspirin 81 MG tablet Take 81 mg by mouth daily.    [provider]  carvedilol (COREG) 25 MG tablet TAKE 1 TABLET BY MOUTH TWICE (2) DAILY WITH A MEAL 02/09/19   Wellington Hampshire, MD  Insulin Pen Needle (EXEL PEN NEEDLES 31GX1/4") 31G X 6 MM MISC Use as directed 02/08/12   Ria Bush, MD  JANUMET XR [phone] MG TB24 TAKE 1 TABLET BY MOUTH ONCE A DAY 03/24/19   Ria Bush, MD  LANTUS 100 UNIT/ML injection INJECT 0.4 ML (40 UNITS TOTAL) INTO THE SKIN AT BEDTIME 07/22/19   Ria Bush, MD  lovastatin (MEVACOR) 40 MG tablet TAKE 1 TABLET BY MOUTH EVERY NIGHT AT BEDTIME 12/09/18   Ria Bush, MD  [facility] ULTRA test strip CHECK BLOOD SUGAR ONCE OR TWICE DAILY ASDIRECTED 07/14/19   Ria Bush, MD  ramipril (ALTACE) 10 MG capsule TAKE 1 CAPSULE BY MOUTH ONCE DAILY 04/13/19   Ria Bush, MD  traMADol (ULTRAM) 50 MG tablet Take 1 tablet (50 mg total) by mouth 2 (two) times daily for 5 days. 08/07/19 08/12/19  Sable Feil, PA-C  TRUEPLUS INSULIN SYRINGE 31G X 5/16" 1 ML MISC USE TO INJECT INSULIN AS DIRECTED 03/17/19   Ria Bush, MD    Allergies Metformin and related and Percocet [oxycodone-acetaminophen]  Family History  Problem Relation Age of Onset  . Cancer Father        lung and prostate  . Diabetes Father   . Heart disease Mother   . Cancer Mother        unsure  . Coronary artery disease Sister 2  . Heart attack Sister 78  . Diabetes Brother   . Stroke Neg Hx   . Colon  cancer Neg Hx   . Colon polyps Neg Hx   . Esophageal cancer Neg Hx   . Rectal cancer Neg Hx   . Stomach cancer Neg Hx     Social History Social History   Tobacco Use  . Smoking status: Former Smoker    Packs/day: 0.25    Years: 10.00    Pack years: 2.50    Types: Cigarettes    Quit date: 10/20/1992    Years since quitting: 26.8  . Smokeless tobacco: Former Systems developer    Types: Chew    Quit date: 10/20/1992  Substance Use Topics  . Alcohol use: No    Alcohol/week: 0.0 standard drinks  . Drug use: No    Review of Systems  Constitutional: No fever/chills Eyes: No visual changes. ENT: No sore throat. Cardiovascular: Denies chest pain. Respiratory: Denies shortness of breath. Gastrointestinal: No abdominal pain.  No nausea, no vomiting.  No diarrhea.  No constipation. Genitourinary: Negative for dysuria. Musculoskeletal: Negative for back pain. Skin: Negative for rash.  Foreign body left wrist. Neurological: Negative for headaches, focal weakness or numbness. Endocrine:  Diabetes, hyperlipidemia, and hypertension   ____________________________________________   PHYSICAL EXAM:  VITAL SIGNS: ED Triage Vitals  Enc Vitals Group     BP 08/07/19 1156 (!) 148/69     Pulse Rate 08/07/19 1156 88     Resp 08/07/19 1156 18     Temp 08/07/19 1156 97.8 F (36.6 C)     Temp Source 08/07/19 1156 Oral     SpO2 08/07/19 1156 98 %     Weight 08/07/19 1155 168 lb (76.2 kg)     Height 08/07/19 1155 5\' 9"  (1.753 m)     Head Circumference --      Peak Flow --      Pain Score 08/07/19 1205 2     Pain Loc --      Pain Edu? --      Excl. in [place]? --    Constitutional: Alert and oriented. Well appearing and in no acute distress. Cardiovascular: Normal rate, regular rhythm. Grossly normal heart sounds.  Good peripheral circulation. Respiratory: Normal respiratory effort.  No retractions. Lungs CTAB. Neurologic:  Normal speech and language. No gross focal neurologic deficits are appreciated.  No gait instability. Skin:  Skin is warm, dry and intact. No rash noted.  Foreign body left wrist Psychiatric: Mood and affect are normal. Speech and behavior are normal.  ____________________________________________   LABS (all labs ordered are listed, but only abnormal results are displayed)  Labs Reviewed - No data to display ____________________________________________  EKG   ____________________________________________  RADIOLOGY  ED MD interpretation:    Official radiology report(s): No results found.  ____________________________________________   PROCEDURES  Procedure(s) performed (including Critical Care):  .Foreign Body Removal  Date/Time: 08/07/2019 12:58 PM Performed by: Sable Feil, PA-C Authorized by: Sable Feil, PA-C  Consent: Verbal consent obtained. Risks and benefits: risks, benefits and alternatives were discussed Consent given by: patient Patient understanding: patient states understanding of the procedure being performed Patient consent: the patient's understanding of the procedure matches consent given Procedure consent: procedure consent matches procedure scheduled Relevant documents: relevant documents present and verified Test results: test results available and properly labeled Site marked: the operative site was marked Imaging studies: imaging studies available Patient identity confirmed: verbally with patient Time out: Immediately prior to procedure a "time out" was called to verify the correct patient, procedure, equipment, support staff and site/side marked as required. Body area: skin General location: upper extremity Location details: left wrist Anesthesia: local infiltration  Anesthesia: Local Anesthetic: lidocaine 1% without epinephrine Anesthetic total: 2 mL  Sedation: Patient sedated: no  Patient restrained: no Removal mechanism: forceps Dressing: dressing applied Depth: subcutaneous Complexity: simple 1  objects recovered. Post-procedure assessment: foreign body removed Patient tolerance: patient tolerated the procedure well with no immediate complications     ____________________________________________   INITIAL IMPRESSION / ASSESSMENT AND PLAN / ED COURSE  As part of my medical decision making, I reviewed the following data within the [place]  Patient presents with a nail embedded into the right wrist prior to arrival.  Reviewed x-ray findings with patient.  See procedure note for foreign body removal.  Patient given tetanus booster and advised to follow-up PCP.    Norman Marks was evaluated in Emergency Department on 08/07/2019 for the symptoms described in the history of present illness. He was evaluated in the context of the global COVID-19 pandemic, which necessitated consideration that the patient might be at risk for infection with the SARS-CoV-2 virus that causes COVID-19. Institutional protocols and algorithms that pertain to the evaluation of patients at risk for COVID-19 are in a state of rapid change based on information released by regulatory bodies including the CDC and federal and state organizations. These policies and algorithms were followed during the patient's care in the ED.       ____________________________________________   FINAL CLINICAL IMPRESSION(S) / ED DIAGNOSES  Final diagnoses:  Foreign body of left wrist     ED Discharge Orders         Ordered    traMADol (ULTRAM) 50 MG tablet  2 times daily     08/07/19 1257           Note:  This document was prepared using Dragon voice recognition software and may include unintentional dictation errors.    Sable Feil, PA-C 08/07/19 1302    Carrie Mew, MD 08/07/19 [phone]

## 2019-09-07 ENCOUNTER — Encounter: Payer: Self-pay | Admitting: Family Medicine

## 2019-09-07 ENCOUNTER — Other Ambulatory Visit: Payer: Self-pay

## 2019-09-07 ENCOUNTER — Other Ambulatory Visit: Payer: Self-pay | Admitting: Family Medicine

## 2019-09-07 ENCOUNTER — Other Ambulatory Visit: Payer: Self-pay | Admitting: Cardiovascular Disease

## 2019-09-07 ENCOUNTER — Ambulatory Visit (INDEPENDENT_AMBULATORY_CARE_PROVIDER_SITE_OTHER): Payer: Medicare Other | Admitting: Family Medicine

## 2019-09-07 VITALS — BP 140/62 | HR 86 | Temp 97.9°F | Ht 69.0 in | Wt 171.6 lb

## 2019-09-07 DIAGNOSIS — E118 Type 2 diabetes mellitus with unspecified complications: Secondary | ICD-10-CM | POA: Diagnosis not present

## 2019-09-07 DIAGNOSIS — Z794 Long term (current) use of insulin: Secondary | ICD-10-CM | POA: Diagnosis not present

## 2019-09-07 DIAGNOSIS — R229 Localized swelling, mass and lump, unspecified: Secondary | ICD-10-CM

## 2019-09-07 LAB — POCT GLYCOSYLATED HEMOGLOBIN (HGB A1C): Hemoglobin A1C: 7.3 % — AB (ref 4.0–5.6)

## 2019-09-07 NOTE — Patient Instructions (Addendum)
A1c today.  I think these are lipomas or small benign fatty tumors Let us know if growing or changing for further evaluation.  Return as needed or in 6 months for follow up visit.   Lipoma  A lipoma is a noncancerous (benign) tumor that is made up of fat cells. This is a very common type of soft-tissue growth. Lipomas are usually found under the skin (subcutaneous). They may occur in any tissue of the body that contains fat. Common areas for lipomas to appear include the back, arms, shoulders, buttocks, and thighs. Lipomas grow slowly, and they are usually painless. Most lipomas do not cause problems and do not require treatment. What are the causes? The cause of this condition is not known. What increases the risk? You are more likely to develop this condition if:  You are 66-4 years old.  You have a family history of lipomas. What are the signs or symptoms? A lipoma usually appears as a small, round bump under the skin. In most cases, the lump will:  Feel soft or rubbery.  Not cause pain or other symptoms. However, if a lipoma is located in an area where it pushes on nerves, it can become painful or cause other symptoms. How is this diagnosed? A lipoma can usually be diagnosed with a physical exam. You may also have tests to confirm the diagnosis and to rule out other conditions. Tests may include:  Imaging tests, such as a CT scan or an MRI.  Removal of a tissue sample to be looked at under a microscope (biopsy). How is this treated? Treatment for this condition depends on the size of the lipoma and whether it is causing any symptoms.  For small lipomas that are not causing problems, no treatment is needed.  If a lipoma is bigger or it causes problems, surgery may be done to remove the lipoma. Lipomas can also be removed to improve appearance. Most often, the procedure is done after applying a medicine that numbs the area (local anesthetic).  Liposuction may be done to reduce  the size of the lipoma before it is removed through surgery, or it may be done to remove the lipoma. Lipomas are removed with this method in order to limit incision size and scarring. A liposuction tube is inserted through a small incision into the lipoma, and the contents of the lipoma are removed through the tube with suction. Follow these instructions at home:  Watch your lipoma for any changes.  Keep all follow-up visits as told by your health care provider. This is important. Contact a health care provider if:  Your lipoma becomes larger or hard.  Your lipoma becomes painful, red, or increasingly swollen. These could be signs of infection or a more serious condition. Get help right away if:  You develop tingling or numbness in an area near the lipoma. This could indicate that the lipoma is causing nerve damage. Summary  A lipoma is a noncancerous tumor that is made up of fat cells.  Most lipomas do not cause problems and do not require treatment.  If a lipoma is bigger or it causes problems, surgery may be done to remove the lipoma.  Contact a health care provider if your lipoma becomes larger or hard, or if it becomes painful, red, or increasingly swollen. Pain, redness, and swelling could be signs of infection or a more serious condition. This information is not intended to replace advice given to you by your health care provider. Make sure you  discuss any questions you have with your health care provider. Document Revised: 02/16/2019 Document Reviewed: 02/16/2019 Elsevier Patient Education  [place].

## 2019-09-07 NOTE — Progress Notes (Signed)
This visit was conducted in person.  BP 140/62 (BP Location: Right Arm, Patient Position: Sitting, Cuff Size: Normal)   Pulse 86   Temp 97.9 F (36.6 C) (Temporal)   Ht 5\' 9"  (1.753 m)   Wt 171 lb 9 oz (77.8 kg)   SpO2 97%   BMI 25.34 kg/m    CC: check knots on arms Subjective:    Patient ID: Norman Marks, male    DOB: April 10, 1946, 74 y.o.   MRN: [id number]  HPI: Norman Marks is a 74 y.o. male presenting on 09/07/2019 for Cyst (C/o knots on left shoulder.  Noticed about 1 wk ago.  Denies any pain/discomfort in the area. )   L wrist injury with pneumatic nail gun s/p ER evaluation last month, Tdap updated.   Now over the past month noticing knot at L shoulder as well as at axilla. Non-tender, no noted redness or warmth. Denies inciting trauma or injury.   Gives himself insulin injection into arms or abdomen.   Has increased lantus to 42u daily. Fasting cbg's up to 200.      Relevant past medical, surgical, family and social history reviewed and updated as indicated. Interim medical history since our last visit reviewed. Allergies and medications reviewed and updated. Outpatient Medications Prior to Visit  Medication Sig Dispense Refill  . aspirin 81 MG tablet Take 81 mg by mouth daily.    . carvedilol (COREG) 25 MG tablet TAKE 1 TABLET BY MOUTH TWICE A DAY 60 tablet 10  . Insulin Pen Needle (EXEL PEN NEEDLES 31GX1/4") 31G X 6 MM MISC Use as directed 100 each 3  . JANUMET XR [phone] MG TB24 TAKE 1 TABLET BY MOUTH ONCE A DAY 90 tablet 1  . LANTUS 100 UNIT/ML injection INJECT 0.4 ML (40 UNITS TOTAL) INTO THE SKIN AT BEDTIME 10 mL 2  . lovastatin (MEVACOR) 40 MG tablet TAKE ONE TABLET BY MOUTH EVERY NIGHT AT BEDTIME 90 tablet 1  . ONETOUCH ULTRA test strip CHECK BLOOD SUGAR ONCE OR TWICE DAILY ASDIRECTED 100 each 3  . ramipril (ALTACE) 10 MG capsule TAKE 1 CAPSULE BY MOUTH ONCE DAILY 90 capsule 1  . TRUEPLUS INSULIN SYRINGE 31G X 5/16" 1 ML MISC USE TO INJECT INSULIN AS  DIRECTED 100 each 1   No facility-administered medications prior to visit.     Per HPI unless specifically indicated in ROS section below Review of Systems Objective:    BP 140/62 (BP Location: Right Arm, Patient Position: Sitting, Cuff Size: Normal)   Pulse 86   Temp 97.9 F (36.6 C) (Temporal)   Ht 5\' 9"  (1.753 m)   Wt 171 lb 9 oz (77.8 kg)   SpO2 97%   BMI 25.34 kg/m   Wt Readings from Last 3 Encounters:  09/07/19 171 lb 9 oz (77.8 kg)  08/07/19 168 lb (76.2 kg)  06/30/19 169 lb 8 oz (76.9 kg)    Physical Exam Vitals and nursing note reviewed.  Constitutional:      Appearance: Normal appearance. He is not ill-appearing.  Musculoskeletal:       Arms:     Comments: 2 separate small well circumscribed masses on L lateral upper arm and mid axilla, non-tender, no erythema  Neurological:     Mental Status: He is alert.       Results for orders placed or performed during the hospital encounter of 04/06/19  Glucose, capillary  Result Value Ref Range   Glucose-Capillary 198 (H) 70 - 99  mg/dL  Glucose, capillary  Result Value Ref Range   Glucose-Capillary 191 (H) 70 - 99 mg/dL   Lab Results  Component Value Date   HGBA1C 7.2 (H) 02/26/2019    Lab Results  Component Value Date   CREATININE 0.77 02/26/2019   BUN 30 (H) 02/26/2019   NA 138 02/26/2019   K 4.5 02/26/2019   CL 104 02/26/2019   CO2 28 02/26/2019    Assessment & Plan:  This visit occurred during the SARS-CoV-2 public health emergency.  Safety protocols were in place, including screening questions prior to the visit, additional usage of staff PPE, and extensive cleaning of exam room while observing appropriate contact time as indicated for disinfecting solutions.   Problem List Items Addressed This Visit    Localized skin mass, lump, or swelling - Primary    Anticipate benign lipomas. Reviewed etiology with patient. Will monitor for now, he will let me know if changing or enlarging or new symptoms  develop - for further evaluation and likely gen surg consult.      Diabetes mellitus type 2, controlled, with complications ([place])    Due for f/u. Check POC A1c today - anticipate deteriorated control based on recent recall cbg's. Await results, titrate meds accordingly. Pt agrees with plan.       Relevant Orders   POCT glycosylated hemoglobin (Hb A1C)       No orders of the defined types were placed in this encounter.  Orders Placed This Encounter  Procedures  . POCT glycosylated hemoglobin (Hb A1C)    Patient instructions: A1c today.  I think these are lipomas or small benign fatty tumors Let us know if growing or changing for further evaluation.  Return as needed or in 6 months for follow up visit.   Follow up plan: Return in about 6 months (around 03/06/2020) for annual exam, prior fasting for blood work, medicare wellness visit.  Ria Bush, MD

## 2019-09-07 NOTE — Assessment & Plan Note (Signed)
Due for f/u. Check POC A1c today - anticipate deteriorated control based on recent recall cbg's. Await results, titrate meds accordingly. Pt agrees with plan.

## 2019-09-07 NOTE — Assessment & Plan Note (Signed)
Anticipate benign lipomas. Reviewed etiology with patient. Will monitor for now, he will let me know if changing or enlarging or new symptoms develop - for further evaluation and likely gen surg consult.

## 2019-09-21 ENCOUNTER — Other Ambulatory Visit: Payer: Self-pay | Admitting: Family Medicine

## 2019-09-21 DIAGNOSIS — Z794 Long term (current) use of insulin: Secondary | ICD-10-CM

## 2019-09-21 DIAGNOSIS — E118 Type 2 diabetes mellitus with unspecified complications: Secondary | ICD-10-CM

## 2019-10-05 ENCOUNTER — Other Ambulatory Visit: Payer: Self-pay | Admitting: Family Medicine

## 2019-10-11 ENCOUNTER — Other Ambulatory Visit: Payer: Self-pay | Admitting: Family Medicine

## 2019-11-23 ENCOUNTER — Other Ambulatory Visit: Payer: Self-pay | Admitting: Family Medicine

## 2019-12-08 ENCOUNTER — Telehealth: Payer: Self-pay | Admitting: Family Medicine

## 2019-12-08 NOTE — Progress Notes (Signed)
°  Chronic Care Management   Note  12/08/2019 Name: EWIN ROYSTER MRN: [id number] DOB: 11/29/1945  TORAO CASPER is a 74 y.o. year old male who is a primary care patient of Ria Bush, MD. I reached out to Hale Bogus by phone today in response to a referral sent by Mr. Saben Stup Chasen's PCP, Ria Bush, MD.   Mr. Ayson was given information about Chronic Care Management services today including:  1. CCM service includes personalized support from designated clinical staff supervised by his physician, including individualized plan of care and coordination with other care providers 2. 24/7 contact phone numbers for assistance for urgent and routine care needs. 3. Service will only be billed when office clinical staff spend 20 minutes or more in a month to coordinate care. 4. Only one practitioner may furnish and bill the service in a calendar month. 5. The patient may stop CCM services at any time (effective at the end of the month) by phone call to the office staff.   Patient agreed to services and verbal consent obtained.   This note is not being shared with the patient for the following reason: To respect privacy (The patient or proxy has requested that the information not be shared). Follow up plan:   Earney [place] [employer] Scheduler

## 2020-02-05 ENCOUNTER — Other Ambulatory Visit: Payer: Self-pay | Admitting: Family Medicine

## 2020-02-10 DIAGNOSIS — C4491 Basal cell carcinoma of skin, unspecified: Secondary | ICD-10-CM

## 2020-02-10 DIAGNOSIS — C4492 Squamous cell carcinoma of skin, unspecified: Secondary | ICD-10-CM

## 2020-02-10 HISTORY — DX: Squamous cell carcinoma of skin, unspecified: C44.92

## 2020-02-10 HISTORY — DX: Basal cell carcinoma of skin, unspecified: C44.91

## 2020-02-15 ENCOUNTER — Telehealth: Payer: Self-pay | Admitting: Family Medicine

## 2020-02-15 NOTE — Progress Notes (Signed)
°  Chronic Care Management   Note  02/15/2020 Name: DANYEL GRIESS MRN: [id number] DOB: 31-Dec-1945  TREVER STREATER is a 74 y.o. year old male who is a primary care patient of Ria Bush, MD. I reached out to Hale Bogus by phone today in response to a referral sent by Mr. Savian Mazon Shaul's PCP, Ria Bush, MD.   Mr. Villena was given information about Chronic Care Management services today including:  1. CCM service includes personalized support from designated clinical staff supervised by his physician, including individualized plan of care and coordination with other care providers 2. 24/7 contact phone numbers for assistance for urgent and routine care needs. 3. Service will only be billed when office clinical staff spend 20 minutes or more in a month to coordinate care. 4. Only one practitioner may furnish and bill the service in a calendar month. 5. The patient may stop CCM services at any time (effective at the end of the month) by phone call to the office staff.   Patient agreed to services and verbal consent obtained.   Follow up plan:   Carley Perdue [employer] Scheduler

## 2020-02-16 ENCOUNTER — Telehealth: Payer: Self-pay | Admitting: Family Medicine

## 2020-02-16 ENCOUNTER — Other Ambulatory Visit: Payer: Self-pay

## 2020-02-16 ENCOUNTER — Ambulatory Visit (INDEPENDENT_AMBULATORY_CARE_PROVIDER_SITE_OTHER): Payer: Medicare Other | Admitting: Dermatology

## 2020-02-16 DIAGNOSIS — D485 Neoplasm of uncertain behavior of skin: Secondary | ICD-10-CM

## 2020-02-16 DIAGNOSIS — C44311 Basal cell carcinoma of skin of nose: Secondary | ICD-10-CM | POA: Diagnosis not present

## 2020-02-16 NOTE — Patient Instructions (Signed)

## 2020-02-16 NOTE — Progress Notes (Signed)
  Chronic Care Management   Outreach Note  02/16/2020 Name: Norman Marks MRN: [id number] DOB: 07/06/1946  Referred by: Ria Bush, MD Reason for referral : No chief complaint on file.   An unsuccessful telephone outreach was attempted today. The patient was referred to the pharmacist for assistance with care management and care coordination.   Follow Up Plan:   Carley Perdue [employer] Scheduler

## 2020-02-17 ENCOUNTER — Telehealth: Payer: Self-pay | Admitting: Family Medicine

## 2020-02-17 NOTE — Progress Notes (Signed)
  Chronic Care Management   Outreach Note  02/17/2020 Name: Norman Marks MRN: [id number] DOB: 11-08-1945  Referred by: Ria Bush, MD Reason for referral : No chief complaint on file.   A second unsuccessful telephone outreach was attempted today. The patient was referred to pharmacist for assistance with care management and care coordination.  Follow Up Plan:   Carley Perdue [employer] Scheduler

## 2020-02-19 ENCOUNTER — Telehealth: Payer: Self-pay | Admitting: Family Medicine

## 2020-02-19 ENCOUNTER — Telehealth: Payer: Medicare Other

## 2020-02-19 NOTE — Progress Notes (Signed)
  Chronic Care Management   Note  02/19/2020 Name: JAYSHAWN COLSTON MRN: [id number] DOB: 1945-07-18  KORBY RATAY is a 74 y.o. year old male who is a primary care patient of Ria Bush, MD. I reached out to Hale Bogus by phone today in response to a referral sent by Mr. Antwaine Boomhower Venus's PCP, Ria Bush, MD.   Mr. Shifflett was given information about Chronic Care Management services today including:  1. CCM service includes personalized support from designated clinical staff supervised by his physician, including individualized plan of care and coordination with other care providers 2. 24/7 contact phone numbers for assistance for urgent and routine care needs. 3. Service will only be billed when office clinical staff spend 20 minutes or more in a month to coordinate care. 4. Only one practitioner may furnish and bill the service in a calendar month. 5. The patient may stop CCM services at any time (effective at the end of the month) by phone call to the office staff.   Patient did not agree to enrollment in care management services and does not wish to consider at this time.  Follow up plan:   Carley Perdue [employer] Scheduler

## 2020-02-22 ENCOUNTER — Telehealth: Payer: Self-pay

## 2020-02-22 NOTE — Telephone Encounter (Signed)
Patient aware referral needed for MOHS

## 2020-02-22 NOTE — Telephone Encounter (Signed)
-----   Message from Lavonna Monarch, MD sent at 02/19/2020  9:15 AM EDT ----- Schedule [facility]

## 2020-02-29 ENCOUNTER — Other Ambulatory Visit: Payer: Self-pay | Admitting: Family Medicine

## 2020-02-29 DIAGNOSIS — Z794 Long term (current) use of insulin: Secondary | ICD-10-CM

## 2020-02-29 DIAGNOSIS — E118 Type 2 diabetes mellitus with unspecified complications: Secondary | ICD-10-CM

## 2020-03-03 ENCOUNTER — Ambulatory Visit: Payer: Medicare Other

## 2020-03-03 ENCOUNTER — Other Ambulatory Visit: Payer: Self-pay

## 2020-03-03 ENCOUNTER — Other Ambulatory Visit: Payer: Self-pay | Admitting: Family Medicine

## 2020-03-03 ENCOUNTER — Other Ambulatory Visit (INDEPENDENT_AMBULATORY_CARE_PROVIDER_SITE_OTHER): Payer: Medicare Other

## 2020-03-03 DIAGNOSIS — E118 Type 2 diabetes mellitus with unspecified complications: Secondary | ICD-10-CM

## 2020-03-03 DIAGNOSIS — N138 Other obstructive and reflux uropathy: Secondary | ICD-10-CM

## 2020-03-03 DIAGNOSIS — E559 Vitamin D deficiency, unspecified: Secondary | ICD-10-CM

## 2020-03-03 DIAGNOSIS — N401 Enlarged prostate with lower urinary tract symptoms: Secondary | ICD-10-CM

## 2020-03-03 DIAGNOSIS — E785 Hyperlipidemia, unspecified: Secondary | ICD-10-CM

## 2020-03-03 DIAGNOSIS — Z794 Long term (current) use of insulin: Secondary | ICD-10-CM | POA: Diagnosis not present

## 2020-03-03 LAB — COMPREHENSIVE METABOLIC PANEL
ALT: 16 U/L (ref 0–53)
AST: 14 U/L (ref 0–37)
Albumin: 4.2 g/dL (ref 3.5–5.2)
Alkaline Phosphatase: 65 U/L (ref 39–117)
BUN: 13 mg/dL (ref 6–23)
CO2: 30 mEq/L (ref 19–32)
Calcium: 9.3 mg/dL (ref 8.4–10.5)
Chloride: 106 mEq/L (ref 96–112)
Creatinine, Ser: 0.75 mg/dL (ref 0.40–1.50)
GFR: 101.49 mL/min (ref >60.00)
Glucose, Bld: 162 mg/dL — ABNORMAL HIGH (ref 70–99)
Potassium: 4.4 mEq/L (ref 3.5–5.1)
Sodium: 141 mEq/L (ref 135–145)
Total Bilirubin: 0.7 mg/dL (ref 0.2–1.2)
Total Protein: 6.2 g/dL (ref 6.0–8.3)

## 2020-03-03 LAB — PSA: PSA: 2.07 ng/mL (ref 0.10–4.00)

## 2020-03-03 LAB — VITAMIN D 25 HYDROXY (VIT D DEFICIENCY, FRACTURES): VITD: 26.22 ng/mL — ABNORMAL LOW (ref 30.00–100.00)

## 2020-03-03 LAB — LIPID PANEL
Cholesterol: 102 mg/dL (ref 0–200)
HDL: 40.9 mg/dL (ref >39.00)
LDL Cholesterol: 49 mg/dL (ref 0–99)
NonHDL: 61.46
Total CHOL/HDL Ratio: 3
Triglycerides: 60 mg/dL (ref 0.0–149.0)
VLDL: 12 mg/dL (ref 0.0–40.0)

## 2020-03-03 LAB — HEMOGLOBIN A1C: Hgb A1c MFr Bld: 7.8 % — ABNORMAL HIGH (ref 4.6–6.5)

## 2020-03-03 LAB — TSH: TSH: 0.65 u[IU]/mL (ref 0.35–4.50)

## 2020-03-04 ENCOUNTER — Ambulatory Visit (INDEPENDENT_AMBULATORY_CARE_PROVIDER_SITE_OTHER): Payer: Medicare Other

## 2020-03-04 DIAGNOSIS — Z0000 Encounter for general adult medical examination without abnormal findings: Secondary | ICD-10-CM | POA: Diagnosis not present

## 2020-03-04 NOTE — Progress Notes (Signed)
PCP notes:  Health Maintenance: Foot exam- due Flu- due   Abnormal Screenings: none   Patient concerns: none   Nurse concerns: none   Next PCP appt.: 03/08/2020 @ 10:30 am

## 2020-03-04 NOTE — Progress Notes (Signed)
Subjective:   Norman Marks is a 74 y.o. male who presents for Medicare Annual/Subsequent preventive examination.  Review of Systems: N/A      I connected with the patient today by telephone and verified that I am speaking with the correct person using two identifiers. Location patient: home Location nurse: work Persons participating in the virtual visit: patient, Marine scientist.   I discussed the limitations, risks, security and privacy concerns of performing an evaluation and management service by telephone and the availability of in person appointments. I also discussed with the patient that there may be a patient responsible charge related to this service. The patient expressed understanding and verbally consented to this telephonic visit.    Interactive audio and video telecommunications were attempted between this nurse and patient, however failed, due to patient having technical difficulties OR patient did not have access to video capability.  We continued and completed visit with audio only.     Cardiac Risk Factors include: advanced age (>64men, >71 women);male gender;dyslipidemia;hypertension;diabetes mellitus     Objective:    Today's Vitals   There is no height or weight on file to calculate BMI.  Advanced Directives 03/04/2020 08/07/2019 04/06/2019 03/09/2019 12/24/2017 12/04/2017 10/07/2017  Does Patient Have a Medical Advance Directive? No No No No No No No  Would patient like information on creating a medical advance directive? Yes (MAU/Ambulatory/Procedural Areas - Information given) No - Patient declined No - Patient declined No - Patient declined - - No - Patient declined  Pre-existing out of facility DNR order (yellow form or pink MOST form) - - - - - - -    Current Medications (verified) Outpatient Encounter Medications as of 03/04/2020  Medication Sig  . aspirin 81 MG tablet Take 81 mg by mouth daily.  . carvedilol (COREG) 25 MG tablet TAKE 1 TABLET BY MOUTH TWICE A DAY    . Insulin Pen Needle (EXEL PEN NEEDLES 31GX1/4") 31G X 6 MM MISC Use as directed  . JANUMET XR [phone] MG TB24 TAKE 1 TABLET BY MOUTH ONCE DAILY  . LANTUS 100 UNIT/ML injection INJECT 0.4 ML (40 UNITS TOTAL) INTO THE SKIN AT BEDTIME  . lovastatin (MEVACOR) 40 MG tablet TAKE ONE TABLET BY MOUTH EVERY NIGHT AT BEDTIME  . ONETOUCH ULTRA test strip CHECK BLOOD SUGAR ONCE OR TWICE DAILY ASDIRECTED  . ramipril (ALTACE) 10 MG capsule TAKE 1 CAPSULE BY MOUTH ONCE DAILY  . TRUEPLUS INSULIN SYRINGE 31G X 5/16" 1 ML MISC USE TO INJECT INSULIN AS DIRECTED   No facility-administered encounter medications on file as of 03/04/2020.    Allergies (verified) Metformin and related and Percocet [oxycodone-acetaminophen]   History: Past Medical History:  Diagnosis Date  . Basal cell carcinoma 02/10/2020   SEE MEDIA 8 PAGES OF PREVIOUS BCC  . BPH (benign prostatic hypertrophy)   . CAD (coronary artery disease) 2009   [name] (cardiolite 2009 with 70% stenosis of proximal ramus, not amenable to PCI, rec treat medically) --> [name]  . Cancer ([place])    basal cell skin cancer  . DDD (degenerative disc disease), lumbar    s/p surgery  . Diabetes type 2, uncontrolled ([place]) 1990s   referred to [facility] 09/2015  . Headache(784.0)   . History of adenomatous polyp of colon 10/2012  . History of nephrolithiasis 2012   L ureter, R renal pelvis  . HLD (hyperlipidemia)   . HTN (hypertension)    mild  . Hx of migraines   . Left bundle branch block   .  Osteoarthritis of [place] joint of thumb 2014   s/p injections ([place])  . Osteoarthritis of right shoulder 2015   s/p injections ([place])  . Osteopenia 06/2011   T score -3, completed 2 yrs forteo, T score improved to -2.2 (2015)  . 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   SEE MEDIA 8 PAGES OF PREVIOUS SCC AND TREATMENTS  . Vitamin D deficiency   . Wears hearing aid in both ears    Past Surgical History:  Procedure Laterality Date  . [place], 2008    lumbar; x 3, spinal fusion  . CARDIAC CATHETERIZATION  2009   70% stenosis ostium  . CARDIOVASCULAR STRESS TEST  2011   fixed defect inferior/septal walls without reversibility, LVEF 43% post stress  . CATARACT EXTRACTION W/PHACO Right 03/09/2019   Procedure: CATARACT EXTRACTION PHACO AND INTRAOCULAR LENS PLACEMENT ([place]) RIGHT DIABETES;  Surgeon: Eulogio Bear, MD;  Location: [facility];  Service: Ophthalmology;  Laterality: Right;  Diabetic - insulin and oral meds  . CATARACT EXTRACTION W/PHACO Left 04/06/2019   Procedure: CATARACT EXTRACTION PHACO AND INTRAOCULAR LENS PLACEMENT ([facility]) LEFT DIABETIC 01:04.2     11.5%      8.58;  Surgeon: Eulogio Bear, MD;  Location: [facility];  Service: Ophthalmology;  Laterality: Left;  Diabetic - insulin and oral meds  . COLONOSCOPY  10/2012   tubular adenoma, mild diverticulosis, rpt 5 yrs [facility] [place])  . COLONOSCOPY  12/2017   4 polyps - SSP, TA, diverticulosis ([place])  . dexa  06/2011   osteoporosis T -2.6 spine, -1.9 hip  . dexa  07/2013   osteopenia T -2.2 femur, -1.2 spine  . FLEXIBLE SIGMOIDOSCOPY  2007   pt reported normal ([place])  . FOOT SURGERY Left 08/2017  . HAND SURGERY Left 10/2015   [place] arthritis repair [place] [facility])  . KNEE SURGERY Right   . POLYPECTOMY    . SHOULDER SURGERY Right   . SKIN SURGERY  2006   skin cancer  . TENDON REPAIR  08/2017  . TOTAL SHOULDER ARTHROPLASTY Right 01/26/2014   Procedure: TOTAL SHOULDER ARTHROPLASTY;  Surgeon: Johnny Bridge, MD;  Location: [facility];  Service: Orthopedics;  Laterality: Right;  . US ECHOCARDIOGRAPHY  10/2008   nl LV fxn, EF 50%, mild MR, mild pulm HTN   Family History  Problem Relation Age of Onset  . Cancer Father        lung and prostate  . Diabetes Father   . Heart disease Mother   . Cancer Mother        unsure  . Coronary artery disease Sister 47  . Heart attack Sister 82  . Diabetes Brother   . Stroke Neg Hx   . Colon cancer Neg Hx   . Colon polyps  Neg Hx   . Esophageal cancer Neg Hx   . Rectal cancer Neg Hx   . Stomach cancer Neg Hx    Social History   Socioeconomic History  . Marital status: Married    Spouse name: Not on file  . Number of children: Not on file  . Years of education: Not on file  . Highest education level: Not on file  Occupational History  . Not on file  Tobacco Use  . Smoking status: Former Smoker    Packs/day: 0.25    Years: 10.00    Pack years: 2.50    Types: Cigarettes    Quit date: 10/20/1992    Years since quitting: 27.3  .  Smokeless tobacco: Former Systems developer    Types: Chew    Quit date: 10/20/1992  Vaping Use  . Vaping Use: Never used  Substance and Sexual Activity  . Alcohol use: No    Alcohol/week: 0.0 standard drinks  . Drug use: No  . Sexual activity: Not on file  Other Topics Concern  . Not on file  Social History Narrative   Caffeine: 1 cup coffee/day   Lives with wife, 1 dog, 5 cats.   Occupation: retired   Edu: 12th grade   Activity: no regular exercise   Diet: good water, fruits/vegetables daily   Social Determinants of Radio broadcast assistant Strain: Low Risk   . Difficulty of Paying Living Expenses: Not hard at all  Food Insecurity: No Food Insecurity  . Worried About Charity fundraiser in the Last Year: Never true  . Ran Out of Food in the Last Year: Never true  Transportation Needs:   . Lack of Transportation (Medical): Not on file  . Lack of Transportation (Non-Medical): Not on file  Physical Activity: Inactive  . Days of Exercise per Week: 0 days  . Minutes of Exercise per Session: 0 min  Stress: No Stress Concern Present  . Feeling of Stress : Not at all  Social Connections:   . Frequency of Communication with Friends and Family: Not on file  . Frequency of Social Gatherings with Friends and Family: Not on file  . Attends Religious Services: Not on file  . Active Member of Clubs or Organizations: Not on file  . Attends Archivist Meetings: Not on  file  . Marital Status: Not on file    Tobacco Counseling Counseling given: Not Answered   Clinical Intake:  Pre-visit preparation completed: Yes  Pain : No/denies pain     Nutritional Risks: None Diabetes: Yes CBG done?: No Did pt. bring in CBG monitor from home?: No  How often do you need to have someone help you when you read instructions, pamphlets, or other written materials from your doctor or pharmacy?: 1 - Never What is the last grade level you completed in school?: 12th  Diabetic: Yes  Nutrition Risk Assessment:  Has the patient had any N/V/D within the last 2 months?  No  Does the patient have any non-healing wounds?  No  Has the patient had any unintentional weight loss or weight gain?  No   Diabetes:  Is the patient diabetic?  Yes  If diabetic, was a CBG obtained today?  No  Did the patient bring in their glucometer from home?  No  How often do you monitor your CBG's? daily.   Financial Strains and Diabetes Management:  Are you having any financial strains with the device, your supplies or your medication? No .  Does the patient want to be seen by Chronic Care Management for management of their diabetes?  No  Would the patient like to be referred to a Nutritionist or for Diabetic Management?  No   Diabetic Exams:  Diabetic Eye Exam: Completed 03/17/2019 Diabetic Foot Exam: Overdue, Pt has been advised about the importance in completing this exam. Pt is scheduled for diabetic foot exam on 03/08/2020.   Interpreter Needed?: No  Information entered by :: CJohnson, LPN   Activities of Daily Living In your present state of health, do you have any difficulty performing the following activities: 03/04/2020 04/06/2019  Hearing? Y N  Comment wears hearing aids -  Vision? N N  Difficulty concentrating  or making decisions? N N  Walking or climbing stairs? N N  Dressing or bathing? N N  Doing errands, shopping? N -  Preparing Food and eating ? N -  Using  the Toilet? N -  In the past six months, have you accidently leaked urine? Y -  Comment frequency at night -  Do you have problems with loss of bowel control? N -  Managing your Medications? N -  Managing your Finances? N -  Housekeeping or managing your Housekeeping? N -  Some recent data might be hidden    Patient Care Team: Ria Bush, MD as PCP - General (Family Medicine) Wellington Hampshire, MD as PCP - Cardiology (Cardiology) Wellington Hampshire, MD as Consulting Physician (Cardiology) Debbora Dus, [facility] as Pharmacist (Pharmacist) Lavonna Monarch, MD as Consulting Physician (Dermatology)  Indicate any recent Medical Services you may have received from other than [facility] providers in the past year (date may be approximate).     Assessment:   This is a routine wellness examination for [facility].  Hearing/Vision screen  Hearing Screening   125Hz  250Hz  500Hz  1000Hz  2000Hz  3000Hz  4000Hz  6000Hz  8000Hz   Right ear:           Left ear:           Vision Screening Comments: Patient gets annual eye exams   Dietary issues and exercise activities discussed: Current Exercise Habits: The patient does not participate in regular exercise at present, Exercise limited by: None identified  Goals    . DIET - INCREASE WATER INTAKE     Starting 10/07/2017, I will attempt to drink at least 6 glasses of water daily.     . Patient Stated     03/04/2020, I will maintain and continue medications as prescribed.       Depression Screen PHQ 2/9 Scores 03/04/2020 03/03/2019 10/07/2017 10/03/2016 10/03/2015 09/22/2015 09/20/2014  PHQ - 2 Score 0 0 0 0 0 0 0  PHQ- 9 Score 0 - 0 - - - -    Fall Risk Fall Risk  03/04/2020 03/03/2019 10/07/2017 10/03/2016 10/20/2015  Falls in the past year? 0 0 No No (No Data)  Comment - - - - no falls in past week  Number falls in past yr: 0 - - - -  Injury with Fall? 0 - - - -  Risk for fall due to : Medication side effect - - - -  Follow up Falls evaluation completed;Falls  prevention discussed - - - -    Any stairs in or around the home? Yes  If so, are there any without handrails? No  Home free of loose throw rugs in walkways, pet beds, electrical cords, etc? Yes  Adequate lighting in your home to reduce risk of falls? Yes   ASSISTIVE DEVICES UTILIZED TO PREVENT FALLS:  Life alert? No  Use of a cane, walker or w/c? No  Grab bars in the bathroom? No  Shower chair or bench in shower? No  Elevated toilet seat or a handicapped toilet? No   TIMED UP AND GO:  Was the test performed? N/A, telephonic visit .    Cognitive Function: MMSE - Mini Mental State Exam 03/04/2020 10/07/2017  Orientation to time 5 5  Orientation to Place 5 5  Registration 3 3  Attention/ Calculation 5 0  Recall 3 3  Language- name 2 objects - 0  Language- repeat 1 1  Language- follow 3 step command - 3  Language- read & follow direction -  0  Write a sentence - 0  Copy design - 0  Total score - 20  Mini Cog  Mini-Cog screen was completed. Maximum score is 22. A value of 0 denotes this part of the MMSE was not completed or the patient failed this part of the Mini-Cog screening.       Immunizations Immunization History  Administered Date(s) Administered  . Fluad Quad(high Dose 65+) 03/26/2019  . Influenza Whole 04/15/2013  . Influenza,inj,Quad PF,6+ Mos 04/21/2014, 03/23/2015, 04/23/2016, 04/05/2017, 05/30/2018  . Influenza,inj,quad, With Preservative 04/04/2017  . Moderna SARS-COVID-2 Vaccination 10/08/2019, 11/05/2019  . Pneumococcal Conjugate-13 09/16/2013  . Pneumococcal Polysaccharide-23 06/28/2010  . Td 07/16/2009  . Tdap 08/07/2019  . Zoster 01/13/2013    TDAP status: Up to date Flu Vaccine status:due, will be available in the office at the end of August Pneumococcal vaccine status: Up to date Covid-19 vaccine status: Completed vaccines  Qualifies for Shingles Vaccine? Yes   Zostavax completed Yes   Shingrix Completed?: No.    Education has been  provided regarding the importance of this vaccine. Patient has been advised to call insurance company to determine out of pocket expense if they have not yet received this vaccine. Advised may also receive vaccine at local pharmacy or Health Dept. Verbalized acceptance and understanding.  Screening Tests Health Maintenance  Topic Date Due  . DTAP VACCINES (1) 03/16/1946  . FOOT EXAM  06/10/2019  . INFLUENZA VACCINE  02/14/2020  . OPHTHALMOLOGY EXAM  03/16/2020  . HEMOGLOBIN A1C  09/03/2020  . COLONOSCOPY  12/24/2020  . DTaP/Tdap/Td (3 - Td or Tdap) 08/06/2029  . TETANUS/TDAP  08/06/2029  . COVID-19 Vaccine  Completed  . Hepatitis C Screening  Completed  . PNA vac Low Risk Adult  Completed    Health Maintenance  Health Maintenance Due  Topic Date Due  . DTAP VACCINES (1) 03/16/1946  . FOOT EXAM  06/10/2019  . INFLUENZA VACCINE  02/14/2020    Colorectal cancer screening: Completed 12/24/2017. Repeat every 3 years  Lung Cancer Screening: (Low Dose CT Chest recommended if Age 78-80 years, 30 pack-year currently smoking OR have quit w/in 15years.) does not qualify.    Additional Screening:  Hepatitis C Screening: does qualify; Completed 09/15/2015  Vision Screening: Recommended annual ophthalmology exams for early detection of glaucoma and other disorders of the eye. Is the patient up to date with their annual eye exam?  Yes  Who is the provider or what is the name of the office in which the patient attends annual eye exams? [facility] If pt is not established with a provider, would they like to be referred to a provider to establish care? No .   Dental Screening: Recommended annual dental exams for proper oral hygiene  Community Resource Referral / Chronic Care Management: CRR required this visit?  No   CCM required this visit?  No      Plan:     I have personally reviewed and noted the following in the patient's chart:   . Medical and social history . Use of  alcohol, tobacco or illicit drugs  . Current medications and supplements . Functional ability and status . Nutritional status . Physical activity . Advanced directives . List of other physicians . Hospitalizations, surgeries, and ER visits in previous 12 months . Vitals . Screenings to include cognitive, depression, and falls . Referrals and appointments  In addition, I have reviewed and discussed with patient certain preventive protocols, quality metrics, and best practice recommendations.  A written personalized care plan for preventive services as well as general preventive health recommendations were provided to patient.   Due to this being a telephonic visit, the after visit summary with patients personalized plan was offered to patient via mail or my-chart. Patient preferred to pick up at office at next visit.   Andrez Grime, LPN   [id number]

## 2020-03-04 NOTE — Patient Instructions (Signed)
[name] , Thank you for taking time to come for your Medicare Wellness Visit. I appreciate your ongoing commitment to your health goals. Please review the following plan we discussed and let me know if I can assist you in the future.   Screening recommendations/referrals: Colonoscopy: Up to date, completed 12/24/2017, due 12/2020 Recommended yearly ophthalmology/optometry visit for glaucoma screening and checkup Recommended yearly dental visit for hygiene and checkup  Vaccinations: Influenza vaccine: due, will be available at the end of August in the office  Pneumococcal vaccine: Completed series Tdap vaccine: Up to date, completed 08/07/2019, due 07/2029 Shingles vaccine: due, check with your insurance company regarding coverage    Covid-19: Completed series  Advanced directives: Advance directive discussed with you today. I have provided a copy for you to complete at home and have notarized. Once this is complete please bring a copy in to our office so we can scan it into your chart.   Conditions/risks identified: diabetes, hypertension, hyperlipidemia  Next appointment: Follow up in one year for your annual wellness visit.   Preventive Care 75 Years and Older, Male Preventive care refers to lifestyle choices and visits with your health care provider that can promote health and wellness. What does preventive care include?  A yearly physical exam. This is also called an annual well check.  Dental exams once or twice a year.  Routine eye exams. Ask your health care provider how often you should have your eyes checked.  Personal lifestyle choices, including:  Daily care of your teeth and gums.  Regular physical activity.  Eating a healthy diet.  Avoiding tobacco and drug use.  Limiting alcohol use.  Practicing safe sex.  Taking low doses of aspirin every day.  Taking vitamin and mineral supplements as recommended by your health care provider. What happens during an  annual well check? The services and screenings done by your health care provider during your annual well check will depend on your age, overall health, lifestyle risk factors, and family history of disease. Counseling  Your health care provider may ask you questions about your:  Alcohol use.  Tobacco use.  Drug use.  Emotional well-being.  Home and relationship well-being.  Sexual activity.  Eating habits.  History of falls.  Memory and ability to understand (cognition).  Work and work Statistician. Screening  You may have the following tests or measurements:  Height, weight, and BMI.  Blood pressure.  Lipid and cholesterol levels. These may be checked every 5 years, or more frequently if you are over 65 years old.  Skin check.  Lung cancer screening. You may have this screening every year starting at age 75 if you have a 30-pack-year history of smoking and currently smoke or have quit within the past 15 years.  Fecal occult blood test (FOBT) of the stool. You may have this test every year starting at age 75.  Flexible sigmoidoscopy or colonoscopy. You may have a sigmoidoscopy every 5 years or a colonoscopy every 10 years starting at age 75.  Prostate cancer screening. Recommendations will vary depending on your family history and other risks.  Hepatitis C blood test.  Hepatitis B blood test.  Sexually transmitted disease (STD) testing.  Diabetes screening. This is done by checking your blood sugar (glucose) after you have not eaten for a while (fasting). You may have this done every 1-3 years.  Abdominal aortic aneurysm (AAA) screening. You may need this if you are a current or former smoker.  Osteoporosis. You may  be screened starting at age 75 if you are at high risk. Talk with your health care provider about your test results, treatment options, and if necessary, the need for more tests. Vaccines  Your health care provider may recommend certain vaccines,  such as:  Influenza vaccine. This is recommended every year.  Tetanus, diphtheria, and acellular pertussis (Tdap, Td) vaccine. You may need a Td booster every 10 years.  Zoster vaccine. You may need this after age 75.  Pneumococcal 13-valent conjugate (PCV13) vaccine. One dose is recommended after age 75.  Pneumococcal polysaccharide (PPSV23) vaccine. One dose is recommended after age 75. Talk to your health care provider about which screenings and vaccines you need and how often you need them. This information is not intended to replace advice given to you by your health care provider. Make sure you discuss any questions you have with your health care provider. Document Released: 07/29/2015 Document Revised: 03/21/2016 Document Reviewed: 05/03/2015 Elsevier Interactive Patient Education  2017 [employer] Prevention in the Home Falls can cause injuries. They can happen to people of all ages. There are many things you can do to make your home safe and to help prevent falls. What can I do on the outside of my home?  Regularly fix the edges of walkways and driveways and fix any cracks.  Remove anything that might make you trip as you walk through a door, such as a raised step or threshold.  Trim any bushes or trees on the path to your home.  Use bright outdoor lighting.  Clear any walking paths of anything that might make someone trip, such as rocks or tools.  Regularly check to see if handrails are loose or broken. Make sure that both sides of any steps have handrails.  Any raised decks and porches should have guardrails on the edges.  Have any leaves, snow, or ice cleared regularly.  Use sand or salt on walking paths during winter.  Clean up any spills in your garage right away. This includes oil or grease spills. What can I do in the bathroom?  Use night lights.  Install grab bars by the toilet and in the tub and shower. Do not use towel bars as grab bars.  Use  non-skid mats or decals in the tub or shower.  If you need to sit down in the shower, use a plastic, non-slip stool.  Keep the floor dry. Clean up any water that spills on the floor as soon as it happens.  Remove soap buildup in the tub or shower regularly.  Attach bath mats securely with double-sided non-slip rug tape.  Do not have throw rugs and other things on the floor that can make you trip. What can I do in the bedroom?  Use night lights.  Make sure that you have a light by your bed that is easy to reach.  Do not use any sheets or blankets that are too big for your bed. They should not hang down onto the floor.  Have a firm chair that has side arms. You can use this for support while you get dressed.  Do not have throw rugs and other things on the floor that can make you trip. What can I do in the kitchen?  Clean up any spills right away.  Avoid walking on wet floors.  Keep items that you use a lot in easy-to-reach places.  If you need to reach something above you, use a strong step stool that has  a grab bar.  Keep electrical cords out of the way.  Do not use floor polish or wax that makes floors slippery. If you must use wax, use non-skid floor wax.  Do not have throw rugs and other things on the floor that can make you trip. What can I do with my stairs?  Do not leave any items on the stairs.  Make sure that there are handrails on both sides of the stairs and use them. Fix handrails that are broken or loose. Make sure that handrails are as long as the stairways.  Check any carpeting to make sure that it is firmly attached to the stairs. Fix any carpet that is loose or worn.  Avoid having throw rugs at the top or bottom of the stairs. If you do have throw rugs, attach them to the floor with carpet tape.  Make sure that you have a light switch at the top of the stairs and the bottom of the stairs. If you do not have them, ask someone to add them for you. What  else can I do to help prevent falls?  Wear shoes that:  Do not have high heels.  Have rubber bottoms.  Are comfortable and fit you well.  Are closed at the toe. Do not wear sandals.  If you use a stepladder:  Make sure that it is fully opened. Do not climb a closed stepladder.  Make sure that both sides of the stepladder are locked into place.  Ask someone to hold it for you, if possible.  Clearly mark and make sure that you can see:  Any grab bars or handrails.  First and last steps.  Where the edge of each step is.  Use tools that help you move around (mobility aids) if they are needed. These include:  Canes.  Walkers.  Scooters.  Crutches.  Turn on the lights when you go into a dark area. Replace any light bulbs as soon as they burn out.  Set up your furniture so you have a clear path. Avoid moving your furniture around.  If any of your floors are uneven, fix them.  If there are any pets around you, be aware of where they are.  Review your medicines with your doctor. Some medicines can make you feel dizzy. This can increase your chance of falling. Ask your doctor what other things that you can do to help prevent falls. This information is not intended to replace advice given to you by your health care provider. Make sure you discuss any questions you have with your health care provider. Document Released: 04/28/2009 Document Revised: 12/08/2015 Document Reviewed: 08/06/2014 Elsevier Interactive Patient Education  2017 [employer].

## 2020-03-08 ENCOUNTER — Telehealth: Payer: Medicare Other

## 2020-03-08 ENCOUNTER — Other Ambulatory Visit: Payer: Self-pay

## 2020-03-08 ENCOUNTER — Ambulatory Visit (INDEPENDENT_AMBULATORY_CARE_PROVIDER_SITE_OTHER): Payer: Medicare Other | Admitting: Family Medicine

## 2020-03-08 ENCOUNTER — Encounter: Payer: Self-pay | Admitting: Family Medicine

## 2020-03-08 VITALS — BP 132/62 | HR 85 | Temp 97.7°F | Ht 67.5 in | Wt 164.6 lb

## 2020-03-08 DIAGNOSIS — Z0000 Encounter for general adult medical examination without abnormal findings: Secondary | ICD-10-CM | POA: Diagnosis not present

## 2020-03-08 DIAGNOSIS — Z794 Long term (current) use of insulin: Secondary | ICD-10-CM

## 2020-03-08 DIAGNOSIS — N401 Enlarged prostate with lower urinary tract symptoms: Secondary | ICD-10-CM

## 2020-03-08 DIAGNOSIS — Z7189 Other specified counseling: Secondary | ICD-10-CM

## 2020-03-08 DIAGNOSIS — E118 Type 2 diabetes mellitus with unspecified complications: Secondary | ICD-10-CM

## 2020-03-08 DIAGNOSIS — I10 Essential (primary) hypertension: Secondary | ICD-10-CM

## 2020-03-08 DIAGNOSIS — I2510 Atherosclerotic heart disease of native coronary artery without angina pectoris: Secondary | ICD-10-CM

## 2020-03-08 DIAGNOSIS — N138 Other obstructive and reflux uropathy: Secondary | ICD-10-CM

## 2020-03-08 DIAGNOSIS — M8580 Other specified disorders of bone density and structure, unspecified site: Secondary | ICD-10-CM

## 2020-03-08 DIAGNOSIS — E559 Vitamin D deficiency, unspecified: Secondary | ICD-10-CM

## 2020-03-08 DIAGNOSIS — E785 Hyperlipidemia, unspecified: Secondary | ICD-10-CM

## 2020-03-08 LAB — POC URINALSYSI DIPSTICK (AUTOMATED)
Bilirubin, UA: NEGATIVE
Blood, UA: NEGATIVE
Glucose, UA: POSITIVE — AB
Leukocytes, UA: NEGATIVE
Nitrite, UA: NEGATIVE
Protein, UA: NEGATIVE
Spec Grav, UA: >=1.03 — AB (ref 1.010–1.025)
Urobilinogen, UA: 0.2 E.U./dL
pH, UA: 5.5 (ref 5.0–8.0)

## 2020-03-08 MED ORDER — VITAMIN D3 25 MCG (1000 UT) PO CAPS: 1.0000 | ORAL_CAPSULE | Freq: Every day | ORAL | Status: AC

## 2020-03-08 MED ORDER — TAMSULOSIN HCL 0.4 MG PO CAPS: 0.4000 mg | ORAL_CAPSULE | Freq: Every day | ORAL | 3 refills | Status: DC

## 2020-03-08 NOTE — Patient Instructions (Addendum)
Start flomax 0.4mg  nightly for urinary symptoms.  Urinalysis today.  If interested, check with pharmacy about new 2 shot shingles series (shingrix).  Bring [place] copy of your living will to update your chart.  You are doing well today  Return as needed or in 6 months for follow up visit.   Health Maintenance After Age 75 After age 91, you are at a higher risk for certain long-term diseases and infections as well as injuries from falls. Falls are a major cause of broken bones and head injuries in people who are older than age 53. Getting regular preventive care can help to keep you healthy and well. Preventive care includes getting regular testing and making lifestyle changes as recommended by your health care provider. Talk with your health care provider about:  Which screenings and tests you should have. A screening is a test that checks for a disease when you have no symptoms.  A diet and exercise plan that is right for you. What should I know about screenings and tests to prevent falls? Screening and testing are the best ways to find a health problem early. Early diagnosis and treatment give you the best chance of managing medical conditions that are common after age 17. Certain conditions and lifestyle choices may make you more likely to have a fall. Your health care provider may recommend:  Regular vision checks. Poor vision and conditions such as cataracts can make you more likely to have a fall. If you wear glasses, make sure to get your prescription updated if your vision changes.  Medicine review. Work with your health care provider to regularly review all of the medicines you are taking, including over-the-counter medicines. Ask your health care provider about any side effects that may make you more likely to have a fall. Tell your health care provider if any medicines that you take make you feel dizzy or sleepy.  Osteoporosis screening. Osteoporosis is a condition that causes the bones to  get weaker. This can make the bones weak and cause them to break more easily.  Blood pressure screening. Blood pressure changes and medicines to control blood pressure can make you feel dizzy.  Strength and balance checks. Your health care provider may recommend certain tests to check your strength and balance while standing, walking, or changing positions.  Foot health exam. Foot pain and numbness, as well as not wearing proper footwear, can make you more likely to have a fall.  Depression screening. You may be more likely to have a fall if you have a fear of falling, feel emotionally low, or feel unable to do activities that you used to do.  Alcohol use screening. Using too much alcohol can affect your balance and may make you more likely to have a fall. What actions can I take to lower my risk of falls? General instructions  Talk with your health care provider about your risks for falling. Tell your health care provider if: ? You fall. Be sure to tell your health care provider about all falls, even ones that seem minor. ? You feel dizzy, sleepy, or off-balance.  Take over-the-counter and prescription medicines only as told by your health care provider. These include any supplements.  Eat a healthy diet and maintain a healthy weight. A healthy diet includes low-fat dairy products, low-fat (lean) meats, and fiber from whole grains, beans, and lots of fruits and vegetables. Home safety  Remove any tripping hazards, such as rugs, cords, and clutter.  Install safety equipment  such as grab bars in bathrooms and safety rails on stairs.  Keep rooms and walkways well-lit. Activity   Follow a regular exercise program to stay fit. This will help you maintain your balance. Ask your health care provider what types of exercise are appropriate for you.  If you need a cane or walker, use it as recommended by your health care provider.  Wear supportive shoes that have nonskid  soles. Lifestyle  Do not drink alcohol if your health care provider tells you not to drink.  If you drink alcohol, limit how much you have: ? 0-1 drink a day for women. ? 0-2 drinks a day for men.  Be aware of how much alcohol is in your drink. In the [place] one drink equals one typical bottle of beer (12 oz), one-half glass of wine (5 oz), or one shot of hard liquor (1 oz).  Do not use any products that contain nicotine or tobacco, such as cigarettes and e-cigarettes. If you need help quitting, ask your health care provider. Summary  Having a healthy lifestyle and getting preventive care can help to protect your health and wellness after age 71.  Screening and testing are the best way to find a health problem early and help you avoid having a fall. Early diagnosis and treatment give you the best chance for managing medical conditions that are more common for people who are older than age 52.  Falls are a major cause of broken bones and head injuries in people who are older than age 18. Take precautions to prevent a fall at home.  Work with your health care provider to learn what changes you can make to improve your health and wellness and to prevent falls. This information is not intended to replace advice given to you by your health care provider. Make sure you discuss any questions you have with your health care provider. Document Revised: 10/23/2018 Document Reviewed: 05/15/2017 Elsevier Patient Education  2020 [employer].

## 2020-03-08 NOTE — Assessment & Plan Note (Addendum)
Advanced directive: doesn't have one set up. Wife would be HCPOA. Does not want drastic measures. Has paperwork at home. Asked to bring us copy when completed.  

## 2020-03-08 NOTE — Progress Notes (Signed)
This visit was conducted in person.  BP 132/62 (BP Location: Left Arm, Patient Position: Sitting, Cuff Size: Normal)   Pulse 85   Temp 97.7 F (36.5 C) (Temporal)   Ht 5' 7.5" (1.715 m)   Wt 164 lb 9 oz (74.6 kg)   SpO2 96%   BMI 25.39 kg/m    CC: CPE Subjective:    Patient ID: Norman Marks, male    DOB: [birth year], 74 y.o.   MRN: [id number]  HPI: Norman Marks is a 74 y.o. male presenting on 03/08/2020 for Annual Exam (Prt 2. )   Saw health advisor last week for medicare wellness visit. Note reviewed.    No exam data present    Clinical Support from 03/04/2020 in [place] at [facility] Total Score 0      Fall Risk  03/04/2020 03/03/2019 10/07/2017 10/03/2016 10/20/2015  Falls in the past year? 0 0 No No (No Data)  Comment - - - - no falls in past week  Number falls in past yr: 0 - - - -  Injury with Fall? 0 - - - -  Risk for fall due to : Medication side effect - - - -  Follow up Falls evaluation completed;Falls prevention discussed - - - -    DM - fasting sugars high recent 170s despite regularly lantus 42u daily and janumet XR 100/1000mg  daily.   Preventative: COLONOSCOPY 12/2017 - 4 polyps - SSP, TA, diverticulosis, rpt 3 yrs ([place]) Prostate cancer screening - endorses nocturia x3-4, weakening, urgency. Discussed flomax.  DEXA 07/2013 osteopenia T -2.2 femur, -1.2 spine  DEXA 04/2017 osteopenia T score -2.1 hip, -1.9 spine Flu yearly penumovax 06/28/2010.Prevnar 09/2013 Tetanus around [place] 09/2019, 10/2019 Zostavax - 01/2013 shingrix - discussed, rec check with pharmacy  Advanced directive: doesn't have one set up. Wife would be HCPOA.Does not want drastic measures. Has paperwork at home. Asked to bring [place] copy when completed.  Seat belt use discussed Sunscreen use discussed. No changing moles on skin. Sees derm yearly. Planned Mohs to L nose Ex smoker - quit 1994 Alcohol - none Dentist q6 mo Eye exam yearly    Bowels - no constipation Bladder - no incontinence  Caffeine: 1 cup coffee/day  Lives with wife, 1 dog, 5 cats.  Occupation: retired  Edu: 12th grade  Activity: no regular exercise.  Diet: good water, fruits/vegetables daily     Relevant past medical, surgical, family and social history reviewed and updated as indicated. Interim medical history since our last visit reviewed. Allergies and medications reviewed and updated. Outpatient Medications Prior to Visit  Medication Sig Dispense Refill  . aspirin 81 MG tablet Take 81 mg by mouth daily.    . carvedilol (COREG) 25 MG tablet TAKE 1 TABLET BY MOUTH TWICE A DAY 60 tablet 10  . insulin glargine (LANTUS) 100 UNIT/ML injection Inject 0.45 mLs (45 Units total) into the skin at bedtime. 10 mL 2  . Insulin Pen Needle (EXEL PEN NEEDLES 31GX1/4") 31G X 6 MM MISC Use as directed 100 each 3  . JANUMET XR [id number] MG TB24 TAKE 1 TABLET BY MOUTH ONCE DAILY 90 tablet 0  . lovastatin (MEVACOR) 40 MG tablet TAKE ONE TABLET BY MOUTH EVERY NIGHT AT BEDTIME 90 tablet 1  . ONETOUCH ULTRA test strip CHECK BLOOD SUGAR ONCE OR TWICE DAILY ASDIRECTED 100 each 3  . ramipril (ALTACE) 10 MG capsule TAKE 1 CAPSULE BY MOUTH ONCE DAILY 90 capsule  1  . TRUEPLUS INSULIN SYRINGE 31G X 5/16" 1 ML MISC USE TO INJECT INSULIN AS DIRECTED 100 each 1  . LANTUS 100 UNIT/ML injection INJECT 0.4 ML (40 UNITS TOTAL) INTO THE SKIN AT BEDTIME 10 mL 2   No facility-administered medications prior to visit.     Per HPI unless specifically indicated in ROS section below Review of Systems  Constitutional: Negative for activity change, appetite change, chills, fatigue, fever and unexpected weight change.  HENT: Negative for hearing loss.   Eyes: Negative for visual disturbance.  Respiratory: Negative for cough, chest tightness, shortness of breath and wheezing.   Cardiovascular: Negative for chest pain, palpitations and leg swelling.  Gastrointestinal: Negative for  abdominal distention, abdominal pain, blood in stool, constipation, diarrhea, nausea and vomiting.  Genitourinary: Negative for difficulty urinating and hematuria.  Musculoskeletal: Negative for arthralgias, myalgias and neck pain.  Skin: Negative for rash.  Neurological: Negative for dizziness, seizures, syncope and headaches.  Hematological: Negative for adenopathy. Does not bruise/bleed easily.  Psychiatric/Behavioral: Negative for dysphoric mood. The patient is not nervous/anxious.    Objective:  BP 132/62 (BP Location: Left Arm, Patient Position: Sitting, Cuff Size: Normal)   Pulse 85   Temp 97.7 F (36.5 C) (Temporal)   Ht 5' 7.5" (1.715 m)   Wt 164 lb 9 oz (74.6 kg)   SpO2 96%   BMI 25.39 kg/m   Wt Readings from Last 3 Encounters:  03/08/20 164 lb 9 oz (74.6 kg)  09/07/19 171 lb 9 oz (77.8 kg)  08/07/19 168 lb (76.2 kg)      Physical Exam Vitals and nursing note reviewed.  Constitutional:      General: He is not in acute distress.    Appearance: Normal appearance. He is well-developed. He is not ill-appearing.  HENT:     Head: Normocephalic and atraumatic.     Right Ear: Hearing, tympanic membrane, ear canal and external ear normal.     Left Ear: Hearing, tympanic membrane, ear canal and external ear normal.  Eyes:     General: No scleral icterus.    Extraocular Movements: Extraocular movements intact.     Conjunctiva/sclera: Conjunctivae normal.     Pupils: Pupils are equal, round, and reactive to light.  Neck:     Thyroid: No thyroid mass, thyromegaly or thyroid tenderness.     Vascular: No carotid bruit.  Cardiovascular:     Rate and Rhythm: Normal rate and regular rhythm.     Pulses: Normal pulses.          Radial pulses are 2+ on the right side and 2+ on the left side.     Heart sounds: Normal heart sounds. No murmur heard.   Pulmonary:     Effort: Pulmonary effort is normal. No respiratory distress.     Breath sounds: Normal breath sounds. No wheezing,  rhonchi or rales.  Abdominal:     General: Abdomen is flat. Bowel sounds are normal. There is no distension.     Palpations: Abdomen is soft. There is no mass.     Tenderness: There is no abdominal tenderness. There is no guarding or rebound.     Hernia: No hernia is present.  Musculoskeletal:        General: Normal range of motion.     Cervical back: Normal range of motion and neck supple.     Right lower leg: No edema.     Left lower leg: No edema.  Lymphadenopathy:     Cervical:  No cervical adenopathy.  Skin:    General: Skin is warm and dry.     Findings: No rash.  Neurological:     General: No focal deficit present.     Mental Status: He is alert and oriented to person, place, and time.     Comments: CN grossly intact, station and gait intact  Psychiatric:        Mood and Affect: Mood normal.        Behavior: Behavior normal.        Thought Content: Thought content normal.        Judgment: Judgment normal.       Results for orders placed or performed in visit on 03/08/20  POCT Urinalysis Dipstick (Automated)  Result Value Ref Range   Color, UA yellow    Clarity, UA clear    Glucose, UA Positive (A) Negative   Bilirubin, UA negative    Ketones, UA +/-    Spec Grav, UA >=1.030 (A) 1.010 - 1.025   Blood, UA negative    pH, UA 5.5 5.0 - 8.0   Protein, UA Negative Negative   Urobilinogen, UA 0.2 0.2 or 1.0 E.U./dL   Nitrite, UA negative    Leukocytes, UA Negative Negative   Assessment & Plan:  This visit occurred during the SARS-CoV-2 public health emergency.  Safety protocols were in place, including screening questions prior to the visit, additional usage of staff PPE, and extensive cleaning of exam room while observing appropriate contact time as indicated for disinfecting solutions.   Problem List Items Addressed This Visit    Vitamin D deficiency   Osteopenia    rec vit D 1000 -2000 iu daily.       HTN (hypertension)    Chronic, stable. Continue current  regimen.       HLD (hyperlipidemia)    Chronic, stable on lovastatin - continue. The ASCVD Risk score Mikey Bussing DC Jr., et al., 2013) failed to calculate for the following reasons:   The valid total cholesterol range is 130 to 320 mg/dL       Health maintenance examination - Primary    Preventative protocols reviewed and updated unless pt declined. Discussed healthy diet and lifestyle.       Diabetes mellitus type 2, controlled, with complications ([place])    Chronic, deteriorated but overall adequate for age. Discussed slow titration of basal insulin to 45u if needed. Reassess at 6 mo DM f/u visit.       Relevant Medications   insulin glargine (LANTUS) 100 UNIT/ML injection   CAD (coronary artery disease)    Continues aspirin, statin.       BPH with obstruction/lower urinary tract symptoms    IPSS = 18-3.  Start flomax 0.4mg  nightly.  Reassess at f/u visit.       Relevant Medications   tamsulosin (FLOMAX) 0.4 MG CAPS capsule   Other Relevant Orders   POCT Urinalysis Dipstick (Automated) (Completed)   Advanced care planning/counseling discussion    Advanced directive: doesn't have one set up. Wife would be HCPOA.Does not want drastic measures. Has paperwork at home. Asked to bring [place] copy when completed.           Meds ordered this encounter  Medications  . tamsulosin (FLOMAX) 0.4 MG CAPS capsule    Sig: Take 1 capsule (0.4 mg total) by mouth daily.    Dispense:  30 capsule    Refill:  3  . Cholecalciferol (VITAMIN D3) 25 MCG (1000 UT) CAPS  Sig: Take 1 capsule (1,000 Units total) by mouth daily.    Dispense:  30 capsule   Orders Placed This Encounter  Procedures  . POCT Urinalysis Dipstick (Automated)    Patient instructions: Start flomax 0.4mg  nightly for urinary symptoms.  Urinalysis today.  If interested, check with pharmacy about new 2 shot shingles series (shingrix).  Bring [place] copy of your living will to update your chart.  You are doing well today    Return as needed or in 6 months for follow up visit.   Follow up plan: Return in about 6 months (around 09/08/2020) for follow up visit.  Ria Bush, MD

## 2020-03-08 NOTE — Assessment & Plan Note (Signed)
Chronic, deteriorated but overall adequate for age. Discussed slow titration of basal insulin to 45u if needed. Reassess at 6 mo DM f/u visit.

## 2020-03-08 NOTE — Assessment & Plan Note (Signed)
Preventative protocols reviewed and updated unless pt declined. Discussed healthy diet and lifestyle.  

## 2020-03-08 NOTE — Assessment & Plan Note (Signed)
rec vit D 1000 -2000 iu daily.

## 2020-03-08 NOTE — Assessment & Plan Note (Addendum)
IPSS = 18-3.  Start flomax 0.4mg  nightly.  Reassess at f/u visit.

## 2020-03-08 NOTE — Assessment & Plan Note (Signed)
Chronic, stable on lovastatin - continue The ASCVD Risk score (Goff DC Jr., et al., 2013) failed to calculate for the following reasons:   The valid total cholesterol range is 130 to 320 mg/dL  

## 2020-03-08 NOTE — Assessment & Plan Note (Signed)
Chronic, stable. Continue current regimen. 

## 2020-03-08 NOTE — Assessment & Plan Note (Signed)
Continues aspirin, statin.  

## 2020-03-09 ENCOUNTER — Telehealth: Payer: Self-pay

## 2020-03-09 NOTE — Telephone Encounter (Signed)
Spoke with pt relaying results and Dr. G's message.  Pt verbalizes understanding.  

## 2020-03-09 NOTE — Telephone Encounter (Addendum)
Lvm asking pt to call back.  Need to relay results and Dr. Synthia Innocent message.   Labs: Your urine showed high sugar levels and concentrated.   Recommend you increase water, limit added sugars in diet, including sweetened beverages and simple carbs, and continue working towards good diabetes control.

## 2020-03-09 NOTE — Telephone Encounter (Signed)
Pt returned Lisa's call. I got the okay to go ahead and relay the message this time since I had the patient on the phone and it was simple instructions. He is requesting a call back from [place] around 3:00.

## 2020-03-14 ENCOUNTER — Other Ambulatory Visit: Payer: Self-pay | Admitting: Family Medicine

## 2020-03-28 ENCOUNTER — Encounter: Payer: Self-pay | Admitting: Dermatology

## 2020-03-28 NOTE — Progress Notes (Signed)
   Follow-Up Visit   Subjective  Norman Marks is a 74 y.o. male who presents for the following: new problem (pt stated --spot on the nose--redness, itching, crease size for 2 weeks. Denied pain.).  New growth Location: Nose Duration:  Quality:  Associated Signs/Symptoms: Modifying Factors:  Severity:  Timing: Context: History of dozens of nonmelanoma cancers  Objective  Well appearing patient in no apparent distress; mood and affect are within normal limits.  A focused examination was performed including Head and neck.. Relevant physical exam findings are noted in the Assessment and Plan.   Assessment & Plan    Neoplasm of uncertain behavior of skin Right Nasal Sidewall  Skin / nail biopsy Type of biopsy: tangential   Informed consent: discussed and consent obtained   Timeout: patient name, date of birth, surgical site, and procedure verified   Procedure prep:  Patient was prepped and draped in usual sterile fashion Prep type:  Chlorhexidine Anesthesia: the lesion was anesthetized in a standard fashion   Anesthetic:  1% lidocaine w/ epinephrine 1-100,000 local infiltration Instrument used: flexible razor blade   Hemostasis achieved with: ferric subsulfate   Outcome: patient tolerated procedure well   Post-procedure details: wound care instructions given    Specimen 1 - Surgical pathology Differential Diagnosis: bcc scc Check Margins: No     I, Lavonna Monarch, MD, have reviewed all documentation for this visit.  The documentation on 03/28/20 for the exam, diagnosis, procedures, and orders are all accurate and complete.

## 2020-04-06 ENCOUNTER — Ambulatory Visit
Admission: EM | Admit: 2020-04-06 | Discharge: 2020-04-06 | Disposition: A | Discharge status: Home / Self Care | Payer: Medicare Other | Attending: Family Medicine | Admitting: Family Medicine

## 2020-04-06 ENCOUNTER — Other Ambulatory Visit: Payer: Self-pay

## 2020-04-06 ENCOUNTER — Emergency Department
Admission: EM | Admit: 2020-04-06 | Discharge: 2020-04-06 | Disposition: A | Discharge status: Home / Self Care | Payer: Medicare Other | Attending: Emergency Medicine | Admitting: Emergency Medicine

## 2020-04-06 ENCOUNTER — Emergency Department: Payer: Medicare Other

## 2020-04-06 ENCOUNTER — Telehealth: Payer: Self-pay | Admitting: *Deleted

## 2020-04-06 DIAGNOSIS — R42 Dizziness and giddiness: Secondary | ICD-10-CM | POA: Insufficient documentation

## 2020-04-06 DIAGNOSIS — Z96611 Presence of right artificial shoulder joint: Secondary | ICD-10-CM | POA: Insufficient documentation

## 2020-04-06 DIAGNOSIS — E119 Type 2 diabetes mellitus without complications: Secondary | ICD-10-CM | POA: Insufficient documentation

## 2020-04-06 DIAGNOSIS — I10 Essential (primary) hypertension: Secondary | ICD-10-CM | POA: Insufficient documentation

## 2020-04-06 DIAGNOSIS — I2510 Atherosclerotic heart disease of native coronary artery without angina pectoris: Secondary | ICD-10-CM | POA: Insufficient documentation

## 2020-04-06 DIAGNOSIS — Z794 Long term (current) use of insulin: Secondary | ICD-10-CM | POA: Insufficient documentation

## 2020-04-06 DIAGNOSIS — Z79899 Other long term (current) drug therapy: Secondary | ICD-10-CM | POA: Diagnosis not present

## 2020-04-06 DIAGNOSIS — R5383 Other fatigue: Secondary | ICD-10-CM | POA: Diagnosis present

## 2020-04-06 DIAGNOSIS — Z85828 Personal history of other malignant neoplasm of skin: Secondary | ICD-10-CM | POA: Diagnosis not present

## 2020-04-06 DIAGNOSIS — R112 Nausea with vomiting, unspecified: Secondary | ICD-10-CM | POA: Diagnosis not present

## 2020-04-06 DIAGNOSIS — Z87891 Personal history of nicotine dependence: Secondary | ICD-10-CM | POA: Insufficient documentation

## 2020-04-06 DIAGNOSIS — Z7982 Long term (current) use of aspirin: Secondary | ICD-10-CM | POA: Insufficient documentation

## 2020-04-06 DIAGNOSIS — Z20822 Contact with and (suspected) exposure to covid-19: Secondary | ICD-10-CM | POA: Insufficient documentation

## 2020-04-06 DIAGNOSIS — R079 Chest pain, unspecified: Secondary | ICD-10-CM | POA: Diagnosis present

## 2020-04-06 LAB — CBC
HCT: 39.2 % (ref 39.0–52.0)
Hemoglobin: 13 g/dL (ref 13.0–17.0)
MCH: 31.3 pg (ref 26.0–34.0)
MCHC: 33.2 g/dL (ref 30.0–36.0)
MCV: 94.5 fL (ref 80.0–100.0)
Platelets: 243 10*3/uL (ref 150–400)
RBC: 4.15 MIL/uL — ABNORMAL LOW (ref 4.22–5.81)
RDW: 11.9 % (ref 11.5–15.5)
WBC: 7.7 10*3/uL (ref 4.0–10.5)
nRBC: 0 % (ref 0.0–0.2)

## 2020-04-06 LAB — BASIC METABOLIC PANEL
Anion gap: 8 (ref 5–15)
BUN: 15 mg/dL (ref 8–23)
CO2: 24 mmol/L (ref 22–32)
Calcium: 9.1 mg/dL (ref 8.9–10.3)
Chloride: 107 mmol/L (ref 98–111)
Creatinine, Ser: 0.94 mg/dL (ref 0.61–1.24)
GFR calc Af Amer: >60 mL/min (ref >60)
GFR calc non Af Amer: >60 mL/min (ref >60)
Glucose, Bld: 235 mg/dL — ABNORMAL HIGH (ref 70–99)
Potassium: 4.2 mmol/L (ref 3.5–5.1)
Sodium: 139 mmol/L (ref 135–145)

## 2020-04-06 LAB — LIPASE, BLOOD: Lipase: 28 U/L (ref 11–51)

## 2020-04-06 LAB — HEPATIC FUNCTION PANEL
ALT: 19 U/L (ref 0–44)
AST: 18 U/L (ref 15–41)
Albumin: 4 g/dL (ref 3.5–5.0)
Alkaline Phosphatase: 55 U/L (ref 38–126)
Bilirubin, Direct: 0.1 mg/dL (ref 0.0–0.2)
Indirect Bilirubin: 0.7 mg/dL (ref 0.3–0.9)
Total Bilirubin: 0.8 mg/dL (ref 0.3–1.2)
Total Protein: 6.4 g/dL — ABNORMAL LOW (ref 6.5–8.1)

## 2020-04-06 LAB — SARS CORONAVIRUS 2 BY RT PCR (HOSPITAL ORDER, PERFORMED IN ~~LOC~~ HOSPITAL LAB): SARS Coronavirus 2: NEGATIVE

## 2020-04-06 LAB — TROPONIN I (HIGH SENSITIVITY): Troponin I (High Sensitivity): 2 ng/L (ref <18)

## 2020-04-06 MED ORDER — ONDANSETRON 4 MG PO TBDP: 4.0000 mg | ORAL_TABLET | Freq: Three times a day (TID) | ORAL | 0 refills | Status: DC | PRN

## 2020-04-06 NOTE — ED Triage Notes (Signed)
Pt here with CP since Sunday and also N/V. Pt has felt weak and lightheaded and not himself. Pt NAD in triage.

## 2020-04-06 NOTE — Telephone Encounter (Signed)
[facility] - Client TELEPHONE ADVICE RECORD Access[facility]urse Patient Name: Norman Marks Gender: Male DOB: 06-Nov-1945 Age: 74 Y 2 M 21 D Return Phone Number: [phone] (Primary), [phone] (Secondary) Address[place] [address] Client [facility] - Client Client Site [place] - Day Physician Ria Bush - MD Contact Type Call Who Is Calling Patient / Member / Family / Caregiver Call Type Triage / Clinical Caller Name Eudell Mcphee Relationship To Patient Spouse Return Phone Number [phone] (Primary) Chief Complaint CONFUSION - new onset Reason for Call Symptomatic / Request for Health Information Initial Comment Caller states he is nauseas, ears are stopped up, dizzy, and headache. He doesn't seem himself, confused. [place] Translation No Nurse Assessment Nurse: Rock Nephew, RN, Juliann Pulse Date/Time (Eastern Time): 04/06/2020 9:36:45 AM Confirm and document reason for call. If symptomatic, describe symptoms. ---Caller states her husband has nauseas, ears are stopped up, dizzy, and headache. He doesn't seem himself, he seems confused. Has the patient had close contact with a person known or suspected to have the novel coronavirus illness OR traveled / lives in area with major community spread (including international travel) in the last 14 days from the onset of symptoms? * If Asymptomatic, screen for exposure and travel within the last 14 days. ---No Does the patient have any new or worsening symptoms? ---Yes Will a triage be completed? ---Yes Related visit to physician within the last 2 weeks? ---No Does the PT have any chronic conditions? (i.e. diabetes, asthma, this includes High risk factors for pregnancy, etc.) ---Yes List chronic conditions. ---Diabetes, Tachycardia, HTN Is this a behavioral health or substance abuse call?  ---No Guidelines Guideline Title Affirmed Question Affirmed Notes Nurse Date/Time Eilene Ghazi Time) Confusion - Delirium Headache or vomiting Rock Nephew, RN, Juliann Pulse 04/06/2020 9:37:57 AM PLEASE NOTE: All timestamps contained within this report are represented as [place] Standard Time. CONFIDENTIALTY NOTICE: This fax transmission is intended only for the addressee. It contains information that is legally privileged, confidential or otherwise protected from use or disclosure. If you are not the intended recipient, you are strictly prohibited from reviewing, disclosing, copying using or disseminating any of this information or taking any action in reliance on or regarding this information. If you have received this fax in error, please notify us immediately by telephone so that we can arrange for its return to [place]. Phone: [phone], Toll-Free: [phone], Fax: [phone] Page: 2 of 2 Call Id: [id number] [place]. Time Eilene Ghazi Time) Disposition Final User 04/06/2020 9:31:17 AM Send to Urgent Ezra Sites 04/06/2020 9:40:19 AM Go to ED Now Yes Rock Nephew, RN, Gara Kroner Disagree/Comply Comply Caller Understands Yes PreDisposition Call Doctor Care Advice Given Per Guideline GO TO ED NOW: * You need to be seen in the Emergency Department. * Another adult should drive. CARE ADVICE given per Confusion-Delirium (Adult) guideline. Referrals GO TO FACILITY OTHER - SPECIFY

## 2020-04-06 NOTE — Telephone Encounter (Signed)
Patient's wife had called the office earlier and was transferred to access nurse. Patient was advised by access nurse that he needed to go to the ER. Patient's wife called the office back and stated that her husband was advised to go to the ER and he is refusing to go. Patient's wife requested an appointment with [name] and was transferred to triage because of his symptoms. Spoke to patient's wife and was advised that her husband started with symptoms about 4 days ago. Patient's wife stated that he was vomiting Sunday, but that has subsided. Patient's wife stated that her husband is having vertigo,ears are stopped up, headache, seems to be in a daze, nauseated and confused. Patient's wife stated that he is refusing to go anywhere and only wants to se [name].Advised patient's wife unfortunately with his symptoms we can not bring him into the office.  After speaking to [name] patient's wife was advised that it is recommended that he go to the ER. Patient's wife stated that he will not go to the ER. Advised [name] if he refused the ER to at least start out at an UC. After speaking with patient and his wife encouraging him to go somewhere to be evaluated he finally agreed to go to an UC. Information was given to the patient and his wife on the [facility] at [facility]. [name] stated that she will take him to the UC now.

## 2020-04-06 NOTE — ED Notes (Signed)
Pt not in room.

## 2020-04-06 NOTE — ED Provider Notes (Signed)
Norman Marks    CSN: [id number] Arrival date & time: 04/06/20  1213      History   Chief Complaint No chief complaint on file.   HPI Norman Marks is a 73 y.o. male.   Reports that he has been feeling fatigued and lightheaded for the last 3 days. Reports that he called his primary care provider and that he was told to come to urgent care. Denies chest pain, shortness of breath, radiating pain anywhere down the arms or neck. Reports that he has been sweating off and on for no reason for the last 3 days as well. Upon chart review, patient has history of a left bundle branch block as well as CAD, HTN, HLD, diabetes.   The history is provided by the patient.    Past Medical History:  Diagnosis Date  . Basal cell carcinoma 02/10/2020   SEE MEDIA 8 PAGES OF PREVIOUS BCC  . BPH (benign prostatic hypertrophy)   . CAD (coronary artery disease) 2009   [name] (cardiolite 2009 with 70% stenosis of proximal ramus, not amenable to PCI, rec treat medically) --> [name]  . Cancer ([place])    basal cell skin cancer  . DDD (degenerative disc disease), lumbar    s/p surgery  . Diabetes type 2, uncontrolled ([place]) 1990s   referred to [facility] 09/2015  . Headache(784.0)   . History of adenomatous polyp of colon 10/2012  . History of nephrolithiasis 2012   L ureter, R renal pelvis  . HLD (hyperlipidemia)   . HTN (hypertension)    mild  . Hx of migraines   . Left bundle branch block   . Osteoarthritis of [place] joint of thumb 2014   s/p injections (Sypher)  . Osteoarthritis of right shoulder 2015   s/p injections (Sypher)  . Osteopenia 06/2011   T score -3, completed 2 yrs forteo, T score improved to -2.2 (2015)  . 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   SEE MEDIA 8 PAGES OF PREVIOUS SCC AND TREATMENTS  . Vitamin D deficiency   . Wears hearing aid in both ears     Patient Active Problem List   Diagnosis Date Noted  . Localized skin mass, lump, or swelling 09/07/2019  . Primary  localized osteoarthritis of left knee 10/02/2018  . Peroneal tendon tear 08/04/2017  . TMJ tenderness, right 06/13/2017  . Advanced care planning/counseling discussion 09/20/2014  . Health maintenance examination 09/20/2014  . Left bundle branch block 07/26/2014  . Osteoarthritis 01/26/2014  . Primary localized osteoarthrosis of shoulder region 01/26/2014  . Skin lump of leg 09/16/2013  . Transaminitis 05/18/2013  . Medicare annual wellness visit, subsequent 09/10/2012  . Hypotestosteronism 05/09/2012  . Vitamin D deficiency 02/02/2012  . Diabetes mellitus type 2, controlled, with complications ([place])   . Hx of migraines   . BPH with obstruction/lower urinary tract symptoms   . HTN (hypertension)   . HLD (hyperlipidemia)   . CAD (coronary artery disease)   . Osteopenia     Past Surgical History:  Procedure Laterality Date  . [place], 2008   lumbar; x 3, spinal fusion  . CARDIAC CATHETERIZATION  2009   70% stenosis ostium  . CARDIOVASCULAR STRESS TEST  2011   fixed defect inferior/septal walls without reversibility, LVEF 43% post stress  . CATARACT EXTRACTION W/PHACO Right 03/09/2019   Procedure: CATARACT EXTRACTION PHACO AND INTRAOCULAR LENS PLACEMENT ([place]) RIGHT DIABETES;  Surgeon: Eulogio Bear, MD;  Location: [place];  Service: Ophthalmology;  Laterality: Right;  Diabetic - insulin and oral meds  . CATARACT EXTRACTION W/PHACO Left 04/06/2019   Procedure: CATARACT EXTRACTION PHACO AND INTRAOCULAR LENS PLACEMENT (IOC) LEFT DIABETIC 01:04.2     11.5%      8.58;  Surgeon: Eulogio Bear, MD;  Location: [place];  Service: Ophthalmology;  Laterality: Left;  Diabetic - insulin and oral meds  . COLONOSCOPY  10/2012   tubular adenoma, mild diverticulosis, rpt 5 yrs Deatra Ina)  . COLONOSCOPY  12/2017   4 polyps - SSP, TA, diverticulosis ([place])  . dexa  06/2011   osteoporosis T -2.6 spine, -1.9 hip  . dexa  07/2013   osteopenia T -2.2 femur,  -1.2 spine  . FLEXIBLE SIGMOIDOSCOPY  2007   pt reported normal (Mor[place]ati)  . FOOT SURGERY Left 08/2017  . HAND SURGERY Left 10/2015   [place] arthritis repair [place] Silos)  . KNEE SURGERY Right   . POLYPECTOMY    . SHOULDER SURGERY Right   . SKIN SURGERY  2006   skin cancer  . TENDON REPAIR  08/2017  . TOTAL SHOULDER ARTHROPLASTY Right 01/26/2014   Procedure: TOTAL SHOULDER ARTHROPLASTY;  Surgeon: Johnny Bridge, MD;  Location: [place];  Service: Orthopedics;  Laterality: Right;  . US ECHOCARDIOGRAPHY  10/2008   nl LV fxn, EF 50%, mild MR, mild pulm HTN       Home Medications    Prior to Admission medications   Medication Sig Start Date End Date Taking? Authorizing Provider  aspirin 81 MG tablet Take 81 mg by mouth daily.    [provider]  carvedilol (COREG) 25 MG tablet TAKE 1 TABLET BY MOUTH TWICE A DAY 09/07/19   Wellington Hampshire, MD  Cholecalciferol (VITAMIN D3) 25 MCG (1000 UT) CAPS Take 1 capsule (1,000 Units total) by mouth daily. 03/08/20   Ria Bush, MD  insulin glargine (LANTUS) 100 UNIT/ML injection Inject 0.45 mLs (45 Units total) into the skin at bedtime. 03/08/20   Ria Bush, MD  Insulin Pen Needle (EXEL PEN NEEDLES 31GX1/4") 31G X 6 MM MISC Use as directed 02/08/12   Ria Bush, MD  JANUMET XR [phone] MG TB24 TAKE 1 TABLET BY MOUTH ONCE DAILY 02/29/20   Ria Bush, MD  lovastatin (MEVACOR) 40 MG tablet TAKE ONE TABLET BY MOUTH EVERY NIGHT AT BEDTIME 03/14/20   Ria Bush, MD  [facility] ULTRA test strip CHECK BLOOD SUGAR ONCE OR TWICE DAILY ASDIRECTED 07/14/19   Ria Bush, MD  ramipril (ALTACE) 10 MG capsule TAKE 1 CAPSULE BY MOUTH ONCE DAILY 10/12/19   Ria Bush, MD  tamsulosin (FLOMAX) 0.4 MG CAPS capsule Take 1 capsule (0.4 mg total) by mouth daily. 03/08/20   Ria Bush, MD  TRUEPLUS INSULIN SYRINGE 31G X 5/16" 1 ML MISC USE TO INJECT INSULIN AS DIRECTED 10/05/19   Ria Bush, MD    Family  History Family History  Problem Relation Age of Onset  . Cancer Father        lung and prostate  . Diabetes Father   . Heart disease Mother   . Cancer Mother        unsure  . Coronary artery disease Sister 54  . Heart attack Sister 61  . Diabetes Brother   . Stroke Neg Hx   . Colon cancer Neg Hx   . Colon polyps Neg Hx   . Esophageal cancer Neg Hx   . Rectal cancer Neg Hx   . Stomach cancer Neg Hx  Social History Social History   Tobacco Use  . Smoking status: Former Smoker    Packs/day: 0.25    Years: 10.00    Pack years: 2.50    Types: Cigarettes    Quit date: 10/20/1992    Years since quitting: 27.4  . Smokeless tobacco: Former Systems developer    Types: Chew    Quit date: 10/20/1992  Vaping Use  . Vaping Use: Never used  Substance Use Topics  . Alcohol use: No    Alcohol/week: 0.0 standard drinks  . Drug use: No     Allergies   Metformin and related and Percocet [oxycodone-acetaminophen]   Review of Systems Review of Systems   Physical Exam Triage Vital Signs ED Triage Vitals  Enc Vitals Group     BP      Pulse      Resp      Temp      Temp src      SpO2      Weight      Height      Head Circumference      Peak Flow      Pain Score      Pain Loc      Pain Edu?      Excl. in [place]?    No data found.  Updated Vital Signs There were no vitals taken for this visit.   Physical Exam Vitals and nursing note reviewed.  Constitutional:      General: He is not in acute distress.    Appearance: He is well-developed. He is not ill-appearing.  HENT:     Head: Normocephalic and atraumatic.  Eyes:     Conjunctiva/sclera: Conjunctivae normal.  Cardiovascular:     Rate and Rhythm: Normal rate and regular rhythm.     Pulses: Normal pulses.     Heart sounds: No murmur heard.   Pulmonary:     Effort: Pulmonary effort is normal. No respiratory distress.     Breath sounds: Normal breath sounds. No stridor. No wheezing, rhonchi or rales.  Chest:     Chest  wall: No tenderness.  Abdominal:     General: Bowel sounds are normal. There is no distension.     Palpations: Abdomen is soft. There is no mass.     Tenderness: There is no abdominal tenderness. There is no right CVA tenderness, left CVA tenderness, guarding or rebound.     Hernia: No hernia is present.  Musculoskeletal:        General: Normal range of motion.     Cervical back: Normal range of motion and neck supple.  Skin:    General: Skin is warm and dry.     Capillary Refill: Capillary refill takes less than 2 seconds.  Neurological:     General: No focal deficit present.     Mental Status: He is alert and oriented to person, place, and time.  Psychiatric:        Mood and Affect: Mood normal.        Behavior: Behavior normal.        Thought Content: Thought content normal.      UC Treatments / Results  Labs (all labs ordered are listed, but only abnormal results are displayed) Labs Reviewed - No data to display  EKG   Radiology No results found.  Procedures Procedures (including critical care time)  Medications Ordered in UC Medications - No data to display  Initial Impression / Assessment and Plan / UC  Course  I have reviewed the triage vital signs and the nursing notes.  Pertinent labs & imaging results that were available during my care of the patient were reviewed by me and considered in my medical decision making (see chart for details).     Fatigue Dizziness Lightheadedness  Presents today with dizziness, fatigue, lightheadedness for the last 3 days. Reports that over the last couple of days he has been having episodes of sweating for no reason. Has history of CAD, HTN, HLD, diabetes, left bundle branch block EKG today shows frequent ectopic ventricular beats as well as ST elevation in leads V1 through V6 Discussed with patient that he needs to go to the ER immediately These are changes from his last EKG He is declining to go by EMS [facility] with  patient to the car, discussed with wife that he needs to go to the ER at this time She agrees and will take him straight there from our parking lot   Final Clinical Impressions(s) / UC Diagnoses   Final diagnoses:  None   Discharge Instructions   None    ED Prescriptions    None     PDMP not reviewed this encounter.   Faustino Congress, NP 04/06/20 1240

## 2020-04-06 NOTE — Discharge Instructions (Signed)
Your lab results today were all normal. Please follow up with your doctor this week for continued monitoring of your symptoms, and continue to drink plenty of fluids to stay well-hydrated.

## 2020-04-06 NOTE — ED Provider Notes (Signed)
[facility] Emergency Department Provider Note  ____________________________________________  Time seen: Approximately 5:05 PM  I have reviewed the triage vital signs and the nursing notes.   HISTORY  Chief Complaint Chest Pain    HPI Norman Marks is a 74 y.o. male with a history of CAD and hypertension who was sent to the ED from [facility] due to concerns about an abnormal EKG.  He had gone to the walk-in clinic today due to having 2 episodes of vomiting 3 days ago and fatigue over the past few days.  Vomiting has been resolved and he is eating and drinking normally, but feels that he gets tired more easily and is sweating more than usual.  Denies any episodes of pain, no chest pain or shortness of breath, no fever.  No aggravating or alleviating factors.  Denies any recent exertional symptoms.    Past Medical History:  Diagnosis Date  . Basal cell carcinoma 02/10/2020   SEE MEDIA 8 PAGES OF PREVIOUS BCC  . BPH (benign prostatic hypertrophy)   . CAD (coronary artery disease) 2009   [name] (cardiolite 2009 with 70% stenosis of proximal ramus, not amenable to PCI, rec treat medically) --> [name]  . Cancer ([place])    basal cell skin cancer  . DDD (degenerative disc disease), lumbar    s/p surgery  . Diabetes type 2, uncontrolled ([place]) 1990s   referred to [facility] 09/2015  . Headache(784.0)   . History of adenomatous polyp of colon 10/2012  . History of nephrolithiasis 2012   L ureter, R renal pelvis  . HLD (hyperlipidemia)   . HTN (hypertension)    mild  . Hx of migraines   . Left bundle branch block   . Osteoarthritis of [place] joint of thumb 2014   s/p injections (Sypher)  . Osteoarthritis of right shoulder 2015   s/p injections (Sypher)  . Osteopenia 06/2011   T score -3, completed 2 yrs forteo, T score improved to -2.2 (2015)  . 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   SEE MEDIA 8 PAGES OF PREVIOUS SCC AND TREATMENTS  . Vitamin D  deficiency   . Wears hearing aid in both ears      Patient Active Problem List   Diagnosis Date Noted  . Localized skin mass, lump, or swelling 09/07/2019  . Primary localized osteoarthritis of left knee 10/02/2018  . Peroneal tendon tear 08/04/2017  . TMJ tenderness, right 06/13/2017  . Advanced care planning/counseling discussion 09/20/2014  . Health maintenance examination 09/20/2014  . Left bundle branch block 07/26/2014  . Osteoarthritis 01/26/2014  . Primary localized osteoarthrosis of shoulder region 01/26/2014  . Skin lump of leg 09/16/2013  . Transaminitis 05/18/2013  . Medicare annual wellness visit, subsequent 09/10/2012  . Hypotestosteronism 05/09/2012  . Vitamin D deficiency 02/02/2012  . Diabetes mellitus type 2, controlled, with complications ([place])   . Hx of migraines   . BPH with obstruction/lower urinary tract symptoms   . HTN (hypertension)   . HLD (hyperlipidemia)   . CAD (coronary artery disease)   . Osteopenia      Past Surgical History:  Procedure Laterality Date  . [place], 2008   lumbar; x 3, spinal fusion  . CARDIAC CATHETERIZATION  2009   70% stenosis ostium  . CARDIOVASCULAR STRESS TEST  2011   fixed defect inferior/septal walls without reversibility, LVEF 43% post stress  . CATARACT EXTRACTION W/PHACO Right 03/09/2019   Procedure: CATARACT EXTRACTION PHACO AND INTRAOCULAR LENS  PLACEMENT ([place]) RIGHT DIABETES;  Surgeon: Eulogio Bear, MD;  Location: [place];  Service: Ophthalmology;  Laterality: Right;  Diabetic - insulin and oral meds  . CATARACT EXTRACTION W/PHACO Left 04/06/2019   Procedure: CATARACT EXTRACTION PHACO AND INTRAOCULAR LENS PLACEMENT (I[facility]) LEFT DIABETIC 01:04.2     11.5%      8.58;  Surgeon: Eulogio Bear, MD;  Location: [place];  Service: Ophthalmology;  Laterality: Left;  Diabetic - insulin and oral meds  . COLONOSCOPY  10/2012   tubular adenoma, mild diverticulosis, rpt 5 yrs Deatra Ina)   . COLONOSCOPY  12/2017   4 polyps - SSP, TA, diverticulosis (Pyrtle)  . dexa  06/2011   osteoporosis T -2.6 spine, -1.9 hip  . dexa  07/2013   osteopenia T -2.2 femur, -1.2 spine  . FLEXIBLE SIGMOIDOSCOPY  2007   pt reported normal ([place]ayati)  . FOOT SURGERY Left 08/2017  . HAND SURGERY Left 10/2015   [place] arthritis repair [place] Silos)  . KNEE SURGERY Right   . POLYPECTOMY    . SHOULDER SURGERY Right   . SKIN SURGERY  2006   skin cancer  . TENDON REPAIR  08/2017  . TOTAL SHOULDER ARTHROPLASTY Right 01/26/2014   Procedure: TOTAL SHOULDER ARTHROPLASTY;  Surgeon: Johnny Bridge, MD;  Location: [place];  Service: Orthopedics;  Laterality: Right;  . US ECHOCARDIOGRAPHY  10/2008   nl LV fxn, EF 50%, mild MR, mild pulm HTN     Prior to Admission medications   Medication Sig Start Date End Date Taking? Authorizing Provider  aspirin 81 MG tablet Take 81 mg by mouth daily.    [provider]  carvedilol (COREG) 25 MG tablet TAKE 1 TABLET BY MOUTH TWICE A DAY 09/07/19   Wellington Hampshire, MD  Cholecalciferol (VITAMIN D3) 25 MCG (1000 UT) CAPS Take 1 capsule (1,000 Units total) by mouth daily. 03/08/20   Ria Bush, MD  insulin glargine (LANTUS) 100 UNIT/ML injection Inject 0.45 mLs (45 Units total) into the skin at bedtime. 03/08/20   Ria Bush, MD  Insulin Pen Needle (EXEL PEN NEEDLES 31GX1/4") 31G X 6 MM MISC Use as directed 02/08/12   Ria Bush, MD  JANUMET XR [phone] MG TB24 TAKE 1 TABLET BY MOUTH ONCE DAILY 02/29/20   Ria Bush, MD  lovastatin (MEVACOR) 40 MG tablet TAKE ONE TABLET BY MOUTH EVERY NIGHT AT BEDTIME 03/14/20   Ria Bush, MD  ondansetron (ZOFRAN ODT) 4 MG disintegrating tablet Take 1 tablet (4 mg total) by mouth every 8 (eight) hours as needed for nausea or vomiting. 04/06/20   Carrie Mew, MD  [facility] ULTRA test strip CHECK BLOOD SUGAR ONCE OR TWICE DAILY ASDIRECTED 07/14/19   Ria Bush, MD  ramipril (ALTACE) 10 MG capsule  TAKE 1 CAPSULE BY MOUTH ONCE DAILY 10/12/19   Ria Bush, MD  tamsulosin (FLOMAX) 0.4 MG CAPS capsule Take 1 capsule (0.4 mg total) by mouth daily. 03/08/20   Ria Bush, MD  TRUEPLUS INSULIN SYRINGE 31G X 5/16" 1 ML MISC USE TO INJECT INSULIN AS DIRECTED 10/05/19   Ria Bush, MD     Allergies Metformin and related and Percocet [oxycodone-acetaminophen]   Family History  Problem Relation Age of Onset  . Cancer Father        lung and prostate  . Diabetes Father   . Heart disease Mother   . Cancer Mother        unsure  . Coronary artery disease Sister 6  . Heart  attack Sister 59  . Diabetes Brother   . Stroke Neg Hx   . Colon cancer Neg Hx   . Colon polyps Neg Hx   . Esophageal cancer Neg Hx   . Rectal cancer Neg Hx   . Stomach cancer Neg Hx     Social History Social History   Tobacco Use  . Smoking status: Former Smoker    Packs/day: 0.25    Years: 10.00    Pack years: 2.50    Types: Cigarettes    Quit date: 10/20/1992    Years since quitting: 27.4  . Smokeless tobacco: Former Systems developer    Types: Chew    Quit date: 10/20/1992  Vaping Use  . Vaping Use: Never used  Substance Use Topics  . Alcohol use: No    Alcohol/week: 0.0 standard drinks  . Drug use: No    Review of Systems  Constitutional:   No fever or chills.  ENT:   No sore throat. No rhinorrhea. Cardiovascular:   No chest pain or syncope. Respiratory:   No dyspnea or cough. Gastrointestinal:   Negative for abdominal pain, positive vomiting.  No diarrhea Musculoskeletal:   Negative for focal pain or swelling All other systems reviewed and are negative except as documented above in ROS and HPI.  ____________________________________________   PHYSICAL EXAM:  VITAL SIGNS: ED Triage Vitals  Enc Vitals Group     BP 04/06/20 1307 (!) 143/70     Pulse Rate 04/06/20 1307 81     Resp 04/06/20 1307 16     Temp 04/06/20 1307 99 F (37.2 C)     Temp Source 04/06/20 1307 Oral     SpO2  04/06/20 1307 97 %     Weight 04/06/20 1309 162 lb (73.5 kg)     Height 04/06/20 1309 5\' 9"  (1.753 m)     Head Circumference --      Peak Flow --      Pain Score 04/06/20 1309 0     Pain Loc --      Pain Edu? --      Excl. in [place]? --     Vital signs reviewed, nursing assessments reviewed.   Constitutional:   Alert and oriented. Non-toxic appearance. Eyes:   Conjunctivae are normal. EOMI. PERRL. ENT      Head:   Normocephalic and atraumatic.      Nose:   Wearing a mask.      Mouth/Throat:   Wearing a mask.      Neck:   No meningismus. Full ROM. Hematological/Lymphatic/Immunilogical:   No cervical lymphadenopathy. Cardiovascular:   RRR. Symmetric bilateral radial and DP pulses.  No murmurs. Cap refill less than 2 seconds.  No pulse delay Respiratory:   Normal respiratory effort without tachypnea/retractions. Breath sounds are clear and equal bilaterally. No wheezes/rales/rhonchi. Gastrointestinal:   Soft and nontender. Non distended. There is no CVA tenderness.  No rebound, rigidity, or guarding.  Musculoskeletal:   Normal range of motion in all extremities. No joint effusions.  No lower extremity tenderness.  No edema. Neurologic:   Normal speech and language.  Motor grossly intact. No acute focal neurologic deficits are appreciated.  Skin:    Skin is warm, dry and intact. No rash noted.  No petechiae, purpura, or bullae.  ____________________________________________    LABS (pertinent positives/negatives) (all labs ordered are listed, but only abnormal results are displayed) Labs Reviewed  BASIC METABOLIC PANEL - Abnormal; Notable for the following components:  Result Value   Glucose, Bld 235 (*)    All other components within normal limits  CBC - Abnormal; Notable for the following components:   RBC 4.15 (*)    All other components within normal limits  HEPATIC FUNCTION PANEL - Abnormal; Notable for the following components:   Total Protein 6.4 (*)    All other  components within normal limits  SARS CORONAVIRUS 2 BY RT PCR (HOSPITAL ORDER, [facility])  LIPASE, BLOOD  TROPONIN I (HIGH SENSITIVITY)   ____________________________________________   EKG  Interpreted by me EKG performed today at 12:20 PM reviewed Normal sinus rhythm rate of 86.  Left axis, left bundle branch block with repolarization abnormality/J-point elevation.  Negative for acute ischemic changes.  Compared to prior EKG from June 30, 2019, no changes.  ____________________________________________    [id number]  DG Chest 2 View  Result Date: 04/06/2020 CLINICAL DATA:  Chest pain EXAM: CHEST - 2 VIEW COMPARISON:  June 13, 2017 FINDINGS: Lungs are clear. Heart size and pulmonary vascularity are normal. No adenopathy. Total shoulder replacement noted on the right. IMPRESSION: Lungs clear. Cardiac silhouette within normal limits. No adenopathy demonstrable. Electronically Signed   By: Lowella Grip III M.D.   On: 04/06/2020 13:48    ____________________________________________   PROCEDURES Procedures  ____________________________________________  DIFFERENTIAL DIAGNOSIS   Dehydration, viral syndrome, electrolyte abnormality, Covid  CLINICAL IMPRESSION / ASSESSMENT AND PLAN / ED COURSE  Medications ordered in the ED: Medications - No data to display  Pertinent labs & imaging results that were available during my care of the patient were reviewed by me and considered in my medical decision making (see chart for details).  Norman Marks was evaluated in Emergency Department on 04/06/2020 for the symptoms described in the history of present illness. He was evaluated in the context of the global COVID-19 pandemic, which necessitated consideration that the patient might be at risk for infection with the SARS-CoV-2 virus that causes COVID-19. Institutional protocols and algorithms that pertain to the evaluation of patients at risk for  COVID-19 are in a state of rapid change based on information released by regulatory bodies including the CDC and federal and state organizations. These policies and algorithms were followed during the patient's care in the ED.   Patient presents with fatigue and a few episodes of vomiting recently, but overall well-appearing with benign exam.  Vital signs unremarkable.  EKG done at the urgent care was concerning to them due to ST elevation noted in precordial leads.  However, this is a chronic finding as the patient has an underlying left bundle branch block.  He has no symptoms to suggest acute cardiopulmonary process.  EKG is unchanged from prior.  Troponin was ordered as part of triage protocol and was negative, but cardiac work-up not indicated no further testing needed.  Covid swab sent.  Labs and LFTs were reviewed and normal.  He stable for discharge home, prescription for Zofran as needed.      ____________________________________________   FINAL CLINICAL IMPRESSION(S) / ED DIAGNOSES    Final diagnoses:  Other fatigue  Non-intractable vomiting with nausea, unspecified vomiting type     ED Discharge Orders         Ordered    ondansetron (ZOFRAN ODT) 4 MG disintegrating tablet  Every 8 hours PRN        04/06/20 1704          Portions of this note were generated with dragon dictation software.  Dictation errors may occur despite best attempts at proofreading.   Carrie Mew, MD 04/06/20 [phone]

## 2020-04-06 NOTE — Discharge Instructions (Addendum)
To ER via personal vehicle

## 2020-04-06 NOTE — ED Notes (Signed)
First Nurse Note:  This RN received called from [place] to notify us of expected patient arrival; office expresses concern for changes to EKG.  Pt ambulatory, denies any chest pain, NAD noted upon arrival.

## 2020-04-06 NOTE — Telephone Encounter (Signed)
Seen at [facility] with EKG changes, sent straight to ER. Will await eval.

## 2020-04-12 ENCOUNTER — Ambulatory Visit (INDEPENDENT_AMBULATORY_CARE_PROVIDER_SITE_OTHER): Payer: Medicare Other | Admitting: Family Medicine

## 2020-04-12 ENCOUNTER — Ambulatory Visit: Payer: Medicare Other | Admitting: Family Medicine

## 2020-04-12 ENCOUNTER — Encounter: Payer: Self-pay | Admitting: Family Medicine

## 2020-04-12 ENCOUNTER — Other Ambulatory Visit: Payer: Self-pay

## 2020-04-12 VITALS — BP 118/64 | HR 94 | Temp 97.8°F | Ht 69.0 in | Wt 160.4 lb

## 2020-04-12 DIAGNOSIS — R42 Dizziness and giddiness: Secondary | ICD-10-CM | POA: Diagnosis not present

## 2020-04-12 DIAGNOSIS — R112 Nausea with vomiting, unspecified: Secondary | ICD-10-CM | POA: Diagnosis not present

## 2020-04-12 DIAGNOSIS — E118 Type 2 diabetes mellitus with unspecified complications: Secondary | ICD-10-CM

## 2020-04-12 DIAGNOSIS — Z23 Encounter for immunization: Secondary | ICD-10-CM | POA: Diagnosis not present

## 2020-04-12 DIAGNOSIS — Z794 Long term (current) use of insulin: Secondary | ICD-10-CM

## 2020-04-12 NOTE — Patient Instructions (Addendum)
Flu shot today  Orthostatic vital signs today  Stop flomax.  Continue pushing fluids, but try to avoid drinking after 7pm.  Let me know if not feeling better in 1 week after stopping flomax.

## 2020-04-12 NOTE — Progress Notes (Signed)
This visit was conducted in person.  BP 118/64 (BP Location: Left Arm, Patient Position: Sitting, Cuff Size: Normal)   Pulse 94   Temp 97.8 F (36.6 C) (Temporal)   Ht 5\' 9"  (1.753 m)   Wt 160 lb 7 oz (72.8 kg)   SpO2 95%   BMI 23.69 kg/m    BP Readings from Last 3 Encounters:  04/12/20 118/64  04/06/20 (!) 143/70  03/08/20 132/62    Pulse Readings from Last 3 Encounters:  04/12/20 94  04/06/20 81  03/08/20 85    Orthostatic VS for the past 24 hrs (Last 3 readings):  BP- Lying BP- Standing at 0 minutes  04/12/20 1652 -- 118/66  04/12/20 1650 120/70 --   CC: ER f/u visit  Subjective:    Patient ID: Norman Marks, male    DOB: 1945/08/31, 74 y.o.   MRN: [id number]  HPI: Norman Marks is a 74 y.o. male presenting on 04/12/2020 for Hospitalization Follow-up (Seen at [facility] ED on 04/06/20.  Pt accompanied by wife, Letta Median- temp 97.7.)   Recent ER visit for malaise and nausea/vomiting - treated with zofran which caused constipation so he took laxative. CXR and labs stable, COVID swab negative. Ongoing orthostatic lightheadedness. Notes headache when he bends over - HA described as sharp pain to bilateral forehead. Roaring in bilateral ears. ER records reviewed.   Hasn't been feeling well for the past 10 days. Started vomiting last Sunday morning - hasn't felt well since.   No fevers/chills, diarrhea, abd pain, urinary symptoms, blood in stool or urine, chest pain, dyspnea. No vision changes.   Today not feeling well due to car sickness after several errands.  Flomax started 03/08/2020 - has helped nocturia but still getting up about 3 times/night.   DM - continues lantus 42u daily and janumet XR 100/1000mg  daily. Has not titrated lantus dose yet.  Lab Results  Component Value Date   HGBA1C 7.8 (H) 03/03/2020        Relevant past medical, surgical, family and social history reviewed and updated as indicated. Interim medical history since our last visit reviewed. Allergies  and medications reviewed and updated. Outpatient Medications Prior to Visit  Medication Sig Dispense Refill  . aspirin 81 MG tablet Take 81 mg by mouth daily.    . carvedilol (COREG) 25 MG tablet TAKE 1 TABLET BY MOUTH TWICE A DAY 60 tablet 10  . Cholecalciferol (VITAMIN D3) 25 MCG (1000 UT) CAPS Take 1 capsule (1,000 Units total) by mouth daily. 30 capsule   . insulin glargine (LANTUS) 100 UNIT/ML injection Inject 0.45 mLs (45 Units total) into the skin at bedtime. 10 mL 2  . Insulin Pen Needle (EXEL PEN NEEDLES 31GX1/4") 31G X 6 MM MISC Use as directed 100 each 3  . JANUMET XR [id number] MG TB24 TAKE 1 TABLET BY MOUTH ONCE DAILY 90 tablet 0  . lovastatin (MEVACOR) 40 MG tablet TAKE ONE TABLET BY MOUTH EVERY NIGHT AT BEDTIME 90 tablet 3  . ondansetron (ZOFRAN ODT) 4 MG disintegrating tablet Take 1 tablet (4 mg total) by mouth every 8 (eight) hours as needed for nausea or vomiting. 20 tablet 0  . ONETOUCH ULTRA test strip CHECK BLOOD SUGAR ONCE OR TWICE DAILY ASDIRECTED 100 each 3  . ramipril (ALTACE) 10 MG capsule TAKE 1 CAPSULE BY MOUTH ONCE DAILY 90 capsule 1  . tamsulosin (FLOMAX) 0.4 MG CAPS capsule Take 1 capsule (0.4 mg total) by mouth daily. 30 capsule 3  . TRUEPLUS  INSULIN SYRINGE 31G X 5/16" 1 ML MISC USE TO INJECT INSULIN AS DIRECTED 100 each 1   No facility-administered medications prior to visit.     Per HPI unless specifically indicated in ROS section below Review of Systems Objective:  BP 118/64 (BP Location: Left Arm, Patient Position: Sitting, Cuff Size: Normal)   Pulse 94   Temp 97.8 F (36.6 C) (Temporal)   Ht 5\' 9"  (1.753 m)   Wt 160 lb 7 oz (72.8 kg)   SpO2 95%   BMI 23.69 kg/m   Wt Readings from Last 3 Encounters:  04/12/20 160 lb 7 oz (72.8 kg)  04/06/20 162 lb (73.5 kg)  03/08/20 164 lb 9 oz (74.6 kg)      Physical Exam Vitals and nursing note reviewed.  Constitutional:      Appearance: Normal appearance. He is not ill-appearing.  HENT:     Head:  Normocephalic and atraumatic.  Cardiovascular:     Rate and Rhythm: Regular rhythm. Tachycardia present.     Pulses: Normal pulses.     Heart sounds: Normal heart sounds. No murmur heard.   Pulmonary:     Effort: Pulmonary effort is normal. No respiratory distress.     Breath sounds: Normal breath sounds. No wheezing, rhonchi or rales.  Musculoskeletal:     Right lower leg: No edema.     Left lower leg: No edema.  Skin:    General: Skin is warm and dry.  Neurological:     Mental Status: He is alert.     Cranial Nerves: Cranial nerves are intact.     Sensory: Sensation is intact.     Motor: Motor function is intact.     Coordination: Coordination is intact.     Comments:  CN 2-12 intact FTN intact EOMI  Psychiatric:        Mood and Affect: Mood normal.        Behavior: Behavior normal.       Results for orders placed or performed during the hospital encounter of 04/06/20  SARS Coronavirus 2 by RT PCR (hospital order, performed in [place] hospital lab) Nasopharyngeal Nasopharyngeal Swab   Specimen: Nasopharyngeal Swab  Result Value Ref Range   SARS Coronavirus 2 NEGATIVE NEGATIVE  Basic metabolic panel  Result Value Ref Range   Sodium 139 135 - 145 mmol/L   Potassium 4.2 3.5 - 5.1 mmol/L   Chloride 107 98 - 111 mmol/L   CO2 24 22 - 32 mmol/L   Glucose, Bld 235 (H) 70 - 99 mg/dL   BUN 15 8 - 23 mg/dL   Creatinine, Ser 0.94 0.61 - 1.24 mg/dL   Calcium 9.1 8.9 - 10.3 mg/dL   GFR calc non Af Amer >60 >60 mL/min   GFR calc Af Amer >60 >60 mL/min   Anion gap 8 5 - 15  CBC  Result Value Ref Range   WBC 7.7 4.0 - 10.5 K/uL   RBC 4.15 (L) 4.22 - 5.81 MIL/uL   Hemoglobin 13.0 13.0 - 17.0 g/dL   HCT 39.2 39 - 52 %   MCV 94.5 80.0 - 100.0 fL   MCH 31.3 26.0 - 34.0 pg   MCHC 33.2 30.0 - 36.0 g/dL   RDW 11.9 11.5 - 15.5 %   Platelets 243 150 - 400 K/uL   nRBC 0.0 0.0 - 0.2 %  Hepatic function panel  Result Value Ref Range   Total Protein 6.4 (L) 6.5 - 8.1 g/dL    Albumin  4.0 3.5 - 5.0 g/dL   AST 18 15 - 41 U/L   ALT 19 0 - 44 U/L   Alkaline Phosphatase 55 38 - 126 U/L   Total Bilirubin 0.8 0.3 - 1.2 mg/dL   Bilirubin, Direct 0.1 0.0 - 0.2 mg/dL   Indirect Bilirubin 0.7 0.3 - 0.9 mg/dL  Lipase, blood  Result Value Ref Range   Lipase 28 11 - 51 U/L  Troponin I (High Sensitivity)  Result Value Ref Range   Troponin I (High Sensitivity) 2 <18 ng/L   Assessment & Plan:  This visit occurred during the SARS-CoV-2 public health emergency.  Safety protocols were in place, including screening questions prior to the visit, additional usage of staff PPE, and extensive cleaning of exam room while observing appropriate contact time as indicated for disinfecting solutions.   Problem List Items Addressed This Visit    Nausea & vomiting    Isolated episode that resolved after ER visit - ?acute gastroenteritis.       Dizziness - Primary    1.5 week h/o malaise, dizziness, generally feeling poorly without other localizing symptom. This in setting of recently starting flomax (03/08/2020) - ?experiencing medication adverse side effect (orthostatic dizziness). Orthostatic vital signs today are normal. Advised to stop flomax, monitor malaise/dizziness. If better, and ongoing BPH symptoms, could consider finasteride. If not better, to let us know for further evaluation. Pt agrees with plan.       Diabetes mellitus type 2, controlled, with complications ([place])    Stable period without hypoglycemia.        Other Visit Diagnoses    Need for influenza vaccination       Relevant Orders   Flu Vaccine QUAD High Dose(Fluad) (Completed)       No orders of the defined types were placed in this encounter.  Orders Placed This Encounter  Procedures  . Flu Vaccine QUAD High Dose(Fluad)    Patient Instructions  Flu shot today  Orthostatic vital signs today  Stop flomax.  Continue pushing fluids, but try to avoid drinking after 7pm.  Let me know if not feeling better  in 1 week after stopping flomax.     Follow up plan: Return if symptoms worsen or fail to improve.  Ria Bush, MD

## 2020-04-13 NOTE — Assessment & Plan Note (Signed)
Isolated episode that resolved after ER visit - ?acute gastroenteritis.

## 2020-04-13 NOTE — Assessment & Plan Note (Signed)
Stable period without hypoglycemia.

## 2020-04-13 NOTE — Assessment & Plan Note (Addendum)
1.5 week h/o malaise, dizziness, generally feeling poorly without other localizing symptom. This in setting of recently starting flomax (03/08/2020) - ?experiencing medication adverse side effect (orthostatic dizziness). Orthostatic vital signs today are normal. Advised to stop flomax, monitor malaise/dizziness. If better, and ongoing BPH symptoms, could consider finasteride. If not better, to let us know for further evaluation. Pt agrees with plan.

## 2020-04-18 ENCOUNTER — Other Ambulatory Visit: Payer: Self-pay | Admitting: Family Medicine

## 2020-05-02 ENCOUNTER — Other Ambulatory Visit: Payer: Self-pay | Admitting: Family Medicine

## 2020-05-03 NOTE — Telephone Encounter (Signed)
Last OV 04/12/20 Last fill 10/05/19  #100/1

## 2020-05-09 ENCOUNTER — Other Ambulatory Visit: Payer: Self-pay | Admitting: Family Medicine

## 2020-05-11 LAB — HM DIABETES EYE EXAM

## 2020-05-13 ENCOUNTER — Encounter: Payer: Self-pay | Admitting: Family Medicine

## 2020-06-13 NOTE — Progress Notes (Signed)
Cardiology Office Note    Date:  06/14/2020   ID:  [name], DOB Jul 02, 1946, MRN [id number]  PCP:  Norman Bush, MD  Cardiologist:  Norman Sacramento, MD  Electrophysiologist:  None   Chief Complaint: Follow-up  History of Present Illness:   Norman Marks is a 74 y.o. male with history of CAD as detailed below, LBBB, DM2, HTN, HLD, migraine disorder, osteoarthritis, and BPH who presents for follow-up of his CAD.  He underwent cardiac cath in 2009 which showed 1-vessel CAD involving the proximal ramus with 70% stenosis.  He was managed medically.  Follow-up nuclear stress testing in the 12/2013 showed fixed anteroseptal and inferoseptal defects with normal EF.  The defects were felt to be secondary to his left bundle branch block.  He underwent echo in 07/2015 that showed an EF of 50 to 41%, grade 1 diastolic dysfunction, mildly dilated left atrium, mild mitral regurgitation, and mildly elevated PASP at 42 mmHg.  He was last seen in the office in 06/2019 and was doing well from a cardiac perspective.  He was seen in the ED in 03/2020 with nausea and vomiting with associated fatigue.  He was Covid negative.  In this setting he noted some orthostatic lightheadedness.  High-sensitivity troponin negative.  EKG showed sinus rhythm with known left bundle branch block.  He comes in doing well from a cardiac perspective.  He denies any chest pain, palpitations, dyspnea, presyncope, syncope, lower extremity swelling, or falls.  He has noted some mild dizziness that he is attributing to some ear fullness with some mild vertigo-like sensations.  He is tolerating all of his cardiac medications without issues.    Labs independently reviewed: 03/2020 - Hgb 13.0, PLT 243, potassium 4.2, BUN 15, serum creatinine 0.94, albumin 4.0, AST/ALT normal 02/2020 - TC 102, TG 60, HDL 40, LDL 49, TSH normal, A1c 7.8  Past Medical History:  Diagnosis Date  . Basal cell carcinoma 02/10/2020   SEE MEDIA 8  PAGES OF PREVIOUS BCC  . BPH (benign prostatic hypertrophy)   . CAD (coronary artery disease) 2009   Norman Marks (cardiolite 2009 with 70% stenosis of proximal ramus, not amenable to PCI, rec treat medically) --> Norman Marks  . Cancer ([place])    basal cell skin cancer  . DDD (degenerative disc disease), lumbar    s/p surgery  . Diabetes type 2, uncontrolled ([place]) 1990s   referred to [facility] 09/2015  . Headache(784.0)   . History of adenomatous polyp of colon 10/2012  . History of nephrolithiasis 2012   L ureter, R renal pelvis  . HLD (hyperlipidemia)   . HTN (hypertension)    mild  . Hx of migraines   . Left bundle branch block   . Osteoarthritis of [place] joint of thumb 2014   s/p injections (Norman Marks)  . Osteoarthritis of right shoulder 2015   s/p injections (Norman Marks)  . Osteopenia 06/2011   T score -3, completed 2 yrs forteo, T score improved to -2.2 (2015)  . 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   SEE MEDIA 8 PAGES OF PREVIOUS SCC AND TREATMENTS  . Vitamin D deficiency   . Wears hearing aid in both ears     Past Surgical History:  Procedure Laterality Date  . [place], 2008   lumbar; x 3, spinal fusion  . CARDIAC CATHETERIZATION  2009   70% stenosis ostium  . CARDIOVASCULAR STRESS TEST  2011   fixed defect inferior/septal walls without reversibility, LVEF 43% post  stress  . CATARACT EXTRACTION W/PHACO Right 03/09/2019   Procedure: CATARACT EXTRACTION PHACO AND INTRAOCULAR LENS PLACEMENT ([place]) RIGHT DIABETES;  Surgeon: Norman Bear, MD;  Location: [address] [place];  Service: Ophthalmology;  Laterality: Right;  Diabetic - insulin and oral meds  . CATARACT EXTRACTION W/PHACO Left 04/06/2019   Procedure: CATARACT EXTRACTION PHACO AND INTRAOCULAR LENS PLACEMENT ([facility]) LEFT DIABETIC 01:04.2     11.5%      8.58;  Surgeon: Norman Bear, MD;  Location: [place];  Service: Ophthalmology;  Laterality: Left;  Diabetic - insulin and oral meds  . COLONOSCOPY  10/2012     tubular adenoma, mild diverticulosis, rpt 5 yrs Norman Marks)  . COLONOSCOPY  12/2017   4 polyps - SSP, TA, diverticulosis (Norman Marks)  . dexa  06/2011   osteoporosis T -2.6 spine, -1.9 hip  . dexa  07/2013   osteopenia T -2.2 femur, -1.2 spine  . FLEXIBLE SIGMOIDOSCOPY  2007   pt reported normal (Norman Marks)  . FOOT SURGERY Left 08/2017  . HAND SURGERY Left 10/2015   [place] arthritis repair Norman Marks)  . KNEE SURGERY Right   . POLYPECTOMY    . SHOULDER SURGERY Right   . SKIN SURGERY  2006   skin cancer  . TENDON REPAIR  08/2017  . TOTAL SHOULDER ARTHROPLASTY Right 01/26/2014   Procedure: TOTAL SHOULDER ARTHROPLASTY;  Surgeon: Norman Bridge, MD;  Location: [place];  Service: Orthopedics;  Laterality: Right;  . US ECHOCARDIOGRAPHY  10/2008   nl LV fxn, EF 50%, mild MR, mild pulm HTN    Current Medications: No outpatient medications have been marked as taking for the 06/14/20 encounter (Office Visit) with Norman Mu, PA-C.    Allergies:   Metformin and related and Percocet [oxycodone-acetaminophen]   Social History   Socioeconomic History  . Marital status: Married    Spouse name: Not on file  . Number of children: Not on file  . Years of education: Not on file  . Highest education level: Not on file  Occupational History  . Not on file  Tobacco Use  . Smoking status: Former Smoker    Packs/day: 0.25    Years: 10.00    Pack years: 2.50    Types: Cigarettes    Quit date: 10/20/1992    Years since quitting: 27.6  . Smokeless tobacco: Former Systems developer    Types: Chew    Quit date: 10/20/1992  Vaping Use  . Vaping Use: Never used  Substance and Sexual Activity  . Alcohol use: No    Alcohol/week: 0.0 standard drinks  . Drug use: No  . Sexual activity: Not on file  Other Topics Concern  . Not on file  Social History Narrative   Caffeine: 1 cup coffee/day   Lives with wife, 1 dog, 5 cats.   Occupation: retired   Edu: 12th grade   Activity: no regular exercise   Diet: good water,  fruits/vegetables daily   Social Determinants of Radio broadcast assistant Strain: Low Risk   . Difficulty of Paying Living Expenses: Not hard at all  Food Insecurity: No Food Insecurity  . Worried About Charity fundraiser in the Last Year: Never true  . Ran Out of Food in the Last Year: Never true  Transportation Needs:   . Lack of Transportation (Medical): Not on file  . Lack of Transportation (Non-Medical): Not on file  Physical Activity: Inactive  . Days of Exercise per Week: 0 days  .  Minutes of Exercise per Session: 0 min  Stress: No Stress Concern Present  . Feeling of Stress : Not at all  Social Connections:   . Frequency of Communication with Friends and Family: Not on file  . Frequency of Social Gatherings with Friends and Family: Not on file  . Attends Religious Services: Not on file  . Active Member of Clubs or Organizations: Not on file  . Attends Archivist Meetings: Not on file  . Marital Status: Not on file     Family History:  The patient's family history includes Cancer in his father and mother; Coronary artery disease (age of onset: 21) in his sister; Diabetes in his brother and father; Heart attack (age of onset: 77) in his sister; Heart disease in his mother. There is no history of Stroke, Colon cancer, Colon polyps, Esophageal cancer, Rectal cancer, or Stomach cancer.  ROS:   Review of Systems  Constitutional: Negative for chills, diaphoresis, fever, malaise/fatigue and weight loss.  HENT: Negative for congestion.        Ear fullness  Eyes: Negative for discharge and redness.  Respiratory: Negative for cough, sputum production, shortness of breath and wheezing.   Cardiovascular: Negative for chest pain, palpitations, orthopnea, claudication, leg swelling and PND.  Gastrointestinal: Negative for abdominal pain, heartburn, nausea and vomiting.  Musculoskeletal: Negative for falls and myalgias.  Skin: Negative for rash.  Neurological: Positive  for dizziness. Negative for tingling, tremors, sensory change, speech change, focal weakness, loss of consciousness and weakness.  Endo/Heme/Allergies: Does not bruise/bleed easily.  Psychiatric/Behavioral: Negative for substance abuse. The patient is not nervous/anxious.      EKGs/Labs/Other Studies Reviewed:    Studies reviewed were summarized above. The additional studies were reviewed today:  2D echo 07/2015: - Left ventricle: The cavity size was normal. There was mild focal  basal and mild concentric hypertrophy of the septum. Systolic  function was normal. The estimated ejection fraction was in the  range of 50% to 55%. Anteroseptal wall motion hypokinesis noted,  unable to exclude dyskinesis, possibly secondary to bundle branch  block. Doppler parameters are consistent with abnormal left  ventricular relaxation (grade 1 diastolic dysfunction).  - Mitral valve: There was mild regurgitation.  - Left atrium: The atrium was mildly dilated.  - Right ventricle: Systolic function was normal.  - Pulmonary arteries: Systolic pressure was mildly elevated. PA  peak pressure: 42 mm Hg (S).   Impressions:   - Murmur possibly secondary to proximal septal thickening. No  significant outflow tract gradient.   EKG:  EKG is ordered today.  The EKG ordered today demonstrates NSR, 81 bpm, LBBB (known and stable)  Recent Labs: 03/03/2020: TSH 0.65 04/06/2020: ALT 19; BUN 15; Creatinine, Ser 0.94; Hemoglobin 13.0; Platelets 243; Potassium 4.2; Sodium 139  Recent Lipid Panel    Component Value Date/Time   CHOL 102 03/03/2020 0800   TRIG 60.0 03/03/2020 0800   HDL 40.90 03/03/2020 0800   CHOLHDL 3 03/03/2020 0800   VLDL 12.0 03/03/2020 0800   LDLCALC 49 03/03/2020 0800   LDLDIRECT 65.0 01/23/2016 0931    PHYSICAL EXAM:    VS:  BP (!) 150/84 (BP Location: Left Arm, Patient Position: Sitting, Cuff Size: Normal)   Pulse 81   Ht 5\' 9"  (1.753 m)   Wt 166 lb (75.3 kg)    SpO2 98%   BMI 24.51 kg/m   BMI: Body mass index is 24.51 kg/m.  Physical Exam Vitals reviewed.  Constitutional:  Appearance: He is well-developed.  HENT:     Head: Normocephalic and atraumatic.  Eyes:     General:        Right eye: No discharge.        Left eye: No discharge.  Neck:     Vascular: No JVD.  Cardiovascular:     Rate and Rhythm: Normal rate and regular rhythm.     Pulses: No midsystolic click and no opening snap.          Posterior tibial pulses are 2+ on the right side and 2+ on the left side.     Heart sounds: Normal heart sounds, S1 normal and S2 normal. Heart sounds not distant. No murmur heard.  No friction rub.  Pulmonary:     Effort: Pulmonary effort is normal. No respiratory distress.     Breath sounds: Normal breath sounds. No decreased breath sounds, wheezing or rales.  Chest:     Chest wall: No tenderness.  Abdominal:     General: There is no distension.     Palpations: Abdomen is soft.     Tenderness: There is no abdominal tenderness.  Musculoskeletal:     Cervical back: Normal range of motion.  Skin:    General: Skin is warm and dry.     Nails: There is no clubbing.  Neurological:     Mental Status: He is alert and oriented to person, place, and time.  Psychiatric:        Speech: Speech normal.        Behavior: Behavior normal.        Thought Content: Thought content normal.        Judgment: Judgment normal.     Wt Readings from Last 3 Encounters:  06/14/20 166 lb (75.3 kg)  04/12/20 160 lb 7 oz (72.8 kg)  04/06/20 162 lb (73.5 kg)     ASSESSMENT & PLAN:   1. CAD involving the native coronary arteries without angina: He is doing well without any symptoms concerning for angina.  Continue current medical therapy including aspirin, carvedilol, lovastatin, and ramipril.  No indication for ischemic testing at this time.  2. LBBB: Echo in 2017 showed a normal LVEF.  No symptoms concerning for lightheadedness, dizziness, presyncope, or  syncope.  EKG stable.  Continue to monitor periodically.  3. HTN: Blood pressure was mildly elevated at triage at 150/84 with recheck BP 132/78 at the end of his visit.  For now, he will continue to monitor his BP at home and contact us if his readings are consistently greater than 130/80.  He remains on carvedilol and ramipril.  4. HLD: LDL 49 from 02/2020 with normal LFT in 03/2020.  He remains on lovastatin which is followed by his PCP.  5. DM2: Last A1c 7.8.  Followed by PCP.  If additional agent is needed consider SGLT2 inhibitor.  This was not discussed in detail at today's visit.  Disposition: F/u with Dr. Fletcher Anon or an APP in 12 months, sooner if needed.   Medication Adjustments/Labs and Tests Ordered: Current medicines are reviewed at length with the patient today.  Concerns regarding medicines are outlined above. Medication changes, Labs and Tests ordered today are summarized above and listed in the Patient Instructions accessible in Encounters.   Signed, Christell Faith, PA-C 06/14/2020 9:36 AM     [address] [place], [place] [address] [phone]

## 2020-06-14 ENCOUNTER — Other Ambulatory Visit: Payer: Self-pay

## 2020-06-14 ENCOUNTER — Ambulatory Visit (INDEPENDENT_AMBULATORY_CARE_PROVIDER_SITE_OTHER): Payer: Medicare Other | Admitting: Physician Assistant

## 2020-06-14 ENCOUNTER — Encounter: Payer: Self-pay | Admitting: Physician Assistant

## 2020-06-14 VITALS — BP 150/84 | HR 81 | Ht 69.0 in | Wt 166.0 lb

## 2020-06-14 DIAGNOSIS — E785 Hyperlipidemia, unspecified: Secondary | ICD-10-CM | POA: Diagnosis not present

## 2020-06-14 DIAGNOSIS — I10 Essential (primary) hypertension: Secondary | ICD-10-CM

## 2020-06-14 DIAGNOSIS — I447 Left bundle-branch block, unspecified: Secondary | ICD-10-CM

## 2020-06-14 DIAGNOSIS — I2510 Atherosclerotic heart disease of native coronary artery without angina pectoris: Secondary | ICD-10-CM | POA: Diagnosis not present

## 2020-06-14 NOTE — Patient Instructions (Signed)
Medication Instructions:  Your physician recommends that you continue on your current medications as directed. Please refer to the Current Medication list given to you today.  *If you need a refill on your cardiac medications before your next appointment, please call your pharmacy*   Lab Work: None Ordered If you have labs (blood work) drawn today and your tests are completely normal, you will receive your results only by: Marland Kitchen MyChart Message (if you have MyChart) OR . A paper copy in the mail If you have any lab test that is abnormal or we need to change your treatment, we will call you to review the results.   Testing/Procedures: None Ordered   Follow-Up: At [facility], you and your health needs are our priority.  As part of our continuing mission to provide you with exceptional heart care, we have created designated Provider Care Teams.  These Care Teams include your primary Cardiologist (physician) and Advanced Practice Providers (APPs -  Physician Assistants and Nurse Practitioners) who all work together to provide you with the care you need, when you need it.  We recommend signing up for the patient portal called "MyChart".  Sign up information is provided on this After Visit Summary.  MyChart is used to connect with patients for Virtual Visits (Telemedicine).  Patients are able to view lab/test results, encounter notes, upcoming appointments, etc.  Non-urgent messages can be sent to your provider as well.   To learn more about what you can do with MyChart, go to [email or web address].    Your next appointment:   12 month(s)  The format for your next appointment:   In Person  Provider:   You may see Kathlyn Sacramento, MD or one of the following Advanced Practice Providers on your designated Care Team:    Murray Hodgkins, NP  Christell Faith, PA-C  Marrianne Mood, PA-C  Cadence [place], [place]  Laurann Montana, NP    Other Instructions

## 2020-06-20 ENCOUNTER — Other Ambulatory Visit: Payer: Self-pay | Admitting: Family Medicine

## 2020-06-20 ENCOUNTER — Telehealth (INDEPENDENT_AMBULATORY_CARE_PROVIDER_SITE_OTHER): Payer: Medicare Other | Admitting: Family Medicine

## 2020-06-20 ENCOUNTER — Encounter: Payer: Self-pay | Admitting: Family Medicine

## 2020-06-20 ENCOUNTER — Other Ambulatory Visit: Payer: Medicare Other

## 2020-06-20 VITALS — BP 166/86 | Temp 98.1°F | Ht 69.0 in | Wt 164.0 lb

## 2020-06-20 DIAGNOSIS — R42 Dizziness and giddiness: Secondary | ICD-10-CM | POA: Diagnosis not present

## 2020-06-20 DIAGNOSIS — R110 Nausea: Secondary | ICD-10-CM

## 2020-06-20 DIAGNOSIS — J029 Acute pharyngitis, unspecified: Secondary | ICD-10-CM

## 2020-06-20 DIAGNOSIS — R059 Cough, unspecified: Secondary | ICD-10-CM

## 2020-06-20 MED ORDER — DOXYCYCLINE HYCLATE 100 MG PO TABS: 100.0000 mg | ORAL_TABLET | Freq: Two times a day (BID) | ORAL | 0 refills | Status: AC

## 2020-06-20 NOTE — Progress Notes (Signed)
Appointment scheduled today at 2:45 pm for Covid Testing.

## 2020-06-20 NOTE — Progress Notes (Signed)
Ori Kreiter T. Coolidge Gossard, MD Primary Care and Sports Medicine [facility] at [facility] [place], [address] Phone: [phone]  FAX: [phone]  Norman Marks - 74 y.o. male  MRN [id number]  Date of Birth: 13-Nov-1945  Visit Date: 06/20/2020  PCP: Ria Bush, MD  Referred by: Ria Bush, MD Chief Complaint  Patient presents with  . Cough    x 1 week-green tint phelgm   Virtual Visit via Video Note:  I connected with  Norman Marks on 06/20/2020 11:00 AM EST by a video enabled telemedicine application and verified that I am speaking with the correct person using two identifiers.   Location patient: home computer, tablet, or smartphone Location provider: work or home office Consent: Verbal consent directly obtained from Norman Marks. Persons participating in the virtual visit: patient, provider  I discussed the limitations of evaluation and management by telemedicine and the availability of in person appointments. The patient expressed understanding and agreed to proceed.  History of Present Illness:  Cough x 1 week  He does have CAD and DM  Basically, for the last week he has had a progressive cough that is productive of some sputum.  He also has had a headache and some sore throat.  Additionally he has had some nauseousness without diarrhea.  Has had some constipation basically throughout that whole time.  He does have a headache as well.  He has tried some over-the-counter remedies including Tylenol, ibuprofen, as well as Alka-Seltzer none of these have helped in any way.  He has had 2 [place] vaccines without booster  Coughing up some phlegm.  Has been having a headache.  Taken OTC without much relief.  Alka Josiah Lobo is not helping.   No fever  Some sore throat, muffled from his ears. No d/ some constipation  Immunization History  Administered Date(s) Administered  . Fluad Quad(high Dose 65+) 03/26/2019,  04/12/2020  . Influenza Whole 04/15/2013  . Influenza,inj,Quad PF,6+ Mos 04/21/2014, 03/23/2015, 04/23/2016, 04/05/2017, 05/30/2018  . Influenza,inj,quad, With Preservative 04/04/2017  . Moderna SARS-COVID-2 Vaccination 10/08/2019, 11/05/2019  . Pneumococcal Conjugate-13 09/16/2013  . Pneumococcal Polysaccharide-23 06/28/2010  . Td 07/16/2009  . Tdap 08/07/2019  . Zoster 01/13/2013     Review of Systems as above: See pertinent positives and pertinent negatives per HPI No acute distress verbally   Observations/Objective/Exam:  An attempt was made to discern vital signs over the phone and per patient if applicable and possible.   General:    Alert, Oriented, appears well and in no acute distress  Pulmonary:     On inspection no signs of respiratory distress.  Psych / Neurological:     Pleasant and cooperative.  Assessment and Plan:    ICD-10-CM   1. Cough  R05.9   2. Sore throat  J02.9   3. Dizziness  R42   4. Nausea  R11.0    Multiple symptoms, clinical concern for potential COVID-19.  He is can get testing in our office later this afternoon.  Additionally, he does have multiple risk factors, and he certainly could have pneumonia or bronchitis.  Very difficult to assess through video visit.  With his risk factors, I am going to put the patient on some doxycycline.  For his dizziness, he has had some improvement with some over-the-counter Dramamine.  I think that this is the most benign thing and he also felt like this helped his nausea.  I discussed the assessment and treatment plan  with the patient. The patient was provided an opportunity to ask questions and all were answered. The patient agreed with the plan and demonstrated an understanding of the instructions.   The patient was advised to call back or seek an in-person evaluation if the symptoms worsen or if the condition fails to improve as anticipated.  Follow-up: prn unless noted otherwise below No follow-ups on  file.  Meds ordered this encounter  Medications  . doxycycline (VIBRA-TABS) 100 MG tablet    Sig: Take 1 tablet (100 mg total) by mouth 2 (two) times daily for 10 days.    Dispense:  20 tablet    Refill:  0   No orders of the defined types were placed in this encounter.   Signed,  Maud Deed. Javonne Dorko, MD

## 2020-06-22 LAB — SARS-COV-2, NAA 2 DAY TAT

## 2020-06-22 LAB — NOVEL CORONAVIRUS, NAA: SARS-CoV-2, NAA: NOT DETECTED

## 2020-06-22 LAB — SPECIMEN STATUS REPORT

## 2020-06-23 ENCOUNTER — Emergency Department (HOSPITAL_COMMUNITY): Payer: Medicare Other

## 2020-06-23 ENCOUNTER — Ambulatory Visit
Admission: EM | Admit: 2020-06-23 | Discharge: 2020-06-23 | Disposition: A | Discharge status: Home / Self Care | Payer: Medicare Other

## 2020-06-23 ENCOUNTER — Telehealth: Payer: Self-pay

## 2020-06-23 ENCOUNTER — Other Ambulatory Visit: Payer: Self-pay

## 2020-06-23 ENCOUNTER — Telehealth: Payer: Medicare Other | Admitting: Family Medicine

## 2020-06-23 ENCOUNTER — Encounter (HOSPITAL_COMMUNITY): Payer: Self-pay | Admitting: Emergency Medicine

## 2020-06-23 ENCOUNTER — Emergency Department (HOSPITAL_COMMUNITY)
Admission: EM | Admit: 2020-06-23 | Discharge: 2020-06-24 | Disposition: A | Discharge status: Home / Self Care | Payer: Medicare Other | Attending: Emergency Medicine | Admitting: Emergency Medicine

## 2020-06-23 DIAGNOSIS — Z7982 Long term (current) use of aspirin: Secondary | ICD-10-CM | POA: Insufficient documentation

## 2020-06-23 DIAGNOSIS — R42 Dizziness and giddiness: Secondary | ICD-10-CM | POA: Insufficient documentation

## 2020-06-23 DIAGNOSIS — M542 Cervicalgia: Secondary | ICD-10-CM | POA: Diagnosis not present

## 2020-06-23 DIAGNOSIS — Z85828 Personal history of other malignant neoplasm of skin: Secondary | ICD-10-CM | POA: Insufficient documentation

## 2020-06-23 DIAGNOSIS — E1169 Type 2 diabetes mellitus with other specified complication: Secondary | ICD-10-CM | POA: Insufficient documentation

## 2020-06-23 DIAGNOSIS — R059 Cough, unspecified: Secondary | ICD-10-CM | POA: Diagnosis not present

## 2020-06-23 DIAGNOSIS — R110 Nausea: Secondary | ICD-10-CM | POA: Insufficient documentation

## 2020-06-23 DIAGNOSIS — E785 Hyperlipidemia, unspecified: Secondary | ICD-10-CM | POA: Diagnosis not present

## 2020-06-23 DIAGNOSIS — R519 Headache, unspecified: Secondary | ICD-10-CM | POA: Diagnosis not present

## 2020-06-23 DIAGNOSIS — I10 Essential (primary) hypertension: Secondary | ICD-10-CM | POA: Insufficient documentation

## 2020-06-23 DIAGNOSIS — Z87891 Personal history of nicotine dependence: Secondary | ICD-10-CM | POA: Diagnosis not present

## 2020-06-23 DIAGNOSIS — Z794 Long term (current) use of insulin: Secondary | ICD-10-CM | POA: Insufficient documentation

## 2020-06-23 DIAGNOSIS — I2510 Atherosclerotic heart disease of native coronary artery without angina pectoris: Secondary | ICD-10-CM | POA: Insufficient documentation

## 2020-06-23 DIAGNOSIS — Z96611 Presence of right artificial shoulder joint: Secondary | ICD-10-CM | POA: Insufficient documentation

## 2020-06-23 LAB — CBC
HCT: 43.2 % (ref 39.0–52.0)
Hemoglobin: 14.8 g/dL (ref 13.0–17.0)
MCH: 31.5 pg (ref 26.0–34.0)
MCHC: 34.3 g/dL (ref 30.0–36.0)
MCV: 91.9 fL (ref 80.0–100.0)
Platelets: 370 10*3/uL (ref 150–400)
RBC: 4.7 MIL/uL (ref 4.22–5.81)
RDW: 11.8 % (ref 11.5–15.5)
WBC: 8.8 10*3/uL (ref 4.0–10.5)
nRBC: 0 % (ref 0.0–0.2)

## 2020-06-23 LAB — URINALYSIS, ROUTINE W REFLEX MICROSCOPIC
Bacteria, UA: NONE SEEN
Bilirubin Urine: NEGATIVE
Glucose, UA: 150 mg/dL — AB
Hgb urine dipstick: NEGATIVE
Ketones, ur: 80 mg/dL — AB
Leukocytes,Ua: NEGATIVE
Nitrite: NEGATIVE
Protein, ur: 100 mg/dL — AB
Specific Gravity, Urine: 1.023 (ref 1.005–1.030)
pH: 5 (ref 5.0–8.0)

## 2020-06-23 LAB — BASIC METABOLIC PANEL
Anion gap: 11 (ref 5–15)
BUN: 16 mg/dL (ref 8–23)
CO2: 27 mmol/L (ref 22–32)
Calcium: 9.6 mg/dL (ref 8.9–10.3)
Chloride: 103 mmol/L (ref 98–111)
Creatinine, Ser: 0.64 mg/dL (ref 0.61–1.24)
GFR, Estimated: >60 mL/min (ref >60)
Glucose, Bld: 146 mg/dL — ABNORMAL HIGH (ref 70–99)
Potassium: 4 mmol/L (ref 3.5–5.1)
Sodium: 141 mmol/L (ref 135–145)

## 2020-06-23 MED ORDER — METOCLOPRAMIDE HCL 5 MG/ML IJ SOLN
5.0000 mg | Freq: Once | INTRAMUSCULAR | Status: AC
  Administered 2020-06-23: 5 mg via INTRAVENOUS
  Filled 2020-06-23: qty 2

## 2020-06-23 MED ORDER — DIAZEPAM 5 MG/ML IJ SOLN
2.5000 mg | Freq: Once | INTRAMUSCULAR | Status: AC
  Administered 2020-06-23: 2.5 mg via INTRAVENOUS
  Filled 2020-06-23: qty 2

## 2020-06-23 MED ORDER — MECLIZINE HCL 25 MG PO TABS
25.0000 mg | ORAL_TABLET | Freq: Once | ORAL | Status: AC
  Administered 2020-06-23: 25 mg via ORAL
  Filled 2020-06-23: qty 1

## 2020-06-23 MED ORDER — KETOROLAC TROMETHAMINE 15 MG/ML IJ SOLN: 15.0000 mg | Freq: Once | INTRAMUSCULAR | Status: DC

## 2020-06-23 MED ORDER — IOHEXOL 350 MG/ML SOLN
75.0000 mL | Freq: Once | INTRAVENOUS | Status: AC | PRN
  Administered 2020-06-23: 75 mL via INTRAVENOUS

## 2020-06-23 MED ORDER — SODIUM CHLORIDE 0.9 % IV BOLUS
1000.0000 mL | Freq: Once | INTRAVENOUS | Status: AC
  Administered 2020-06-23: 1000 mL via INTRAVENOUS

## 2020-06-23 MED ORDER — DIPHENHYDRAMINE HCL 50 MG/ML IJ SOLN
12.5000 mg | Freq: Once | INTRAMUSCULAR | Status: AC
  Administered 2020-06-23: 12.5 mg via INTRAVENOUS
  Filled 2020-06-23: qty 1

## 2020-06-23 NOTE — Discharge Instructions (Addendum)
Please have your wife drive you to the emergency room now as I am concerned that you are having a stroke or possible restricted blood flow to the brain given positive Romberg sign on your exam and a severe headache, dizziness that is not responding to an antibiotic that would help with the sinus infection.  Go straight to the emergency room now as the stroke is a medical emergency.

## 2020-06-23 NOTE — ED Provider Notes (Signed)
[facility] EMERGENCY DEPARTMENT Provider Note   CSN: [id number] Arrival date & time: 06/23/20  1212     History Chief Complaint  Patient presents with  . Headache  . Nausea  . Dizziness    Norman Marks is a 74 y.o. male.  HPI 74 year old male presents with headache. Has been going on for over a week. Has also had cough, but the headache started first (but then got worse once he started coughing).  Headache is primarily occipital and goes into his front.  Also some superior neck pain.  Moving his neck or certain movements makes him very nauseous and he vomits.  No real significant dizziness though.  No vision changes or fevers.  No shortness of breath.  He was put on doxycycline for this cough and had a Covid test that was negative.  No focal weakness or numbness.  Headache is about a 9 out of 10.  Past Medical History:  Diagnosis Date  . Basal cell carcinoma 02/10/2020   SEE MEDIA 8 PAGES OF PREVIOUS BCC  . BPH (benign prostatic hypertrophy)   . CAD (coronary artery disease) 2009   [name] (cardiolite 2009 with 70% stenosis of proximal ramus, not amenable to PCI, rec treat medically) --> [name]  . Cancer ([place])    basal cell skin cancer  . DDD (degenerative disc disease), lumbar    s/p surgery  . Diabetes type 2, uncontrolled ([place]) 1990s   referred to [facility] 09/2015  . Headache(784.0)   . History of adenomatous polyp of colon 10/2012  . History of nephrolithiasis 2012   L ureter, R renal pelvis  . HLD (hyperlipidemia)   . HTN (hypertension)    mild  . Hx of migraines   . Left bundle branch block   . Osteoarthritis of [place] joint of thumb 2014   s/p injections ([place])  . Osteoarthritis of right shoulder 2015   s/p injections ([place])  . Osteopenia 06/2011   T score -3, completed 2 yrs forteo, T score improved to -2.2 (2015)  . 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   SEE MEDIA 8 PAGES OF PREVIOUS SCC AND TREATMENTS  . Vitamin D deficiency   .  Wears hearing aid in both ears     Patient Active Problem List   Diagnosis Date Noted  . Localized skin mass, lump, or swelling 09/07/2019  . Primary localized osteoarthritis of left knee 10/02/2018  . Peroneal tendon tear 08/04/2017  . TMJ tenderness, right 06/13/2017  . Advanced care planning/counseling discussion 09/20/2014  . Health maintenance examination 09/20/2014  . Left bundle branch block 07/26/2014  . Nausea & vomiting 07/20/2014  . Dizziness 07/20/2014  . Osteoarthritis 01/26/2014  . Primary localized osteoarthrosis of shoulder region 01/26/2014  . Skin lump of leg 09/16/2013  . Transaminitis 05/18/2013  . Medicare annual wellness visit, subsequent 09/10/2012  . Hypotestosteronism 05/09/2012  . Vitamin D deficiency 02/02/2012  . Diabetes mellitus type 2, controlled, with complications ([place])   . Hx of migraines   . BPH with obstruction/lower urinary tract symptoms   . HTN (hypertension)   . HLD (hyperlipidemia)   . CAD (coronary artery disease)   . Osteopenia     Past Surgical History:  Procedure Laterality Date  . [place], 2008   lumbar; x 3, spinal fusion  . CARDIAC CATHETERIZATION  2009   70% stenosis ostium  . CARDIOVASCULAR STRESS TEST  2011   fixed defect inferior/septal walls without reversibility, LVEF  43% post stress  . CATARACT EXTRACTION W/PHACO Right 03/09/2019   Procedure: CATARACT EXTRACTION PHACO AND INTRAOCULAR LENS PLACEMENT ([place]) RIGHT DIABETES;  Surgeon: Eulogio Bear, MD;  Location: [facility];  Service: Ophthalmology;  Laterality: Right;  Diabetic - insulin and oral meds  . CATARACT EXTRACTION W/PHACO Left 04/06/2019   Procedure: CATARACT EXTRACTION PHACO AND INTRAOCULAR LENS PLACEMENT ([facility]) LEFT DIABETIC 01:04.2     11.5%      8.58;  Surgeon: Eulogio Bear, MD;  Location: [facility];  Service: Ophthalmology;  Laterality: Left;  Diabetic - insulin and oral meds  . COLONOSCOPY  10/2012   tubular adenoma,  mild diverticulosis, rpt 5 yrs [place])  . COLONOSCOPY  12/2017   4 polyps - SSP, TA, diverticulosis ([place])  . dexa  06/2011   osteoporosis T -2.6 spine, -1.9 hip  . dexa  07/2013   osteopenia T -2.2 femur, -1.2 spine  . FLEXIBLE SIGMOIDOSCOPY  2007   pt reported normal ([place])  . FOOT SURGERY Left 08/2017  . HAND SURGERY Left 10/2015   [place] arthritis repair [place])  . KNEE SURGERY Right   . POLYPECTOMY    . SHOULDER SURGERY Right   . SKIN SURGERY  2006   skin cancer  . TENDON REPAIR  08/2017  . TOTAL SHOULDER ARTHROPLASTY Right 01/26/2014   Procedure: TOTAL SHOULDER ARTHROPLASTY;  Surgeon: Johnny Bridge, MD;  Location: [facility];  Service: Orthopedics;  Laterality: Right;  . US ECHOCARDIOGRAPHY  10/2008   nl LV fxn, EF 50%, mild MR, mild pulm HTN       Family History  Problem Relation Age of Onset  . Cancer Father        lung and prostate  . Diabetes Father   . Heart disease Mother   . Cancer Mother        unsure  . Coronary artery disease Sister 21  . Heart attack Sister 78  . Diabetes Brother   . Stroke Neg Hx   . Colon cancer Neg Hx   . Colon polyps Neg Hx   . Esophageal cancer Neg Hx   . Rectal cancer Neg Hx   . Stomach cancer Neg Hx     Social History   Tobacco Use  . Smoking status: Former Smoker    Packs/day: 0.25    Years: 10.00    Pack years: 2.50    Types: Cigarettes    Quit date: 10/20/1992    Years since quitting: 27.6  . Smokeless tobacco: Former Systems developer    Types: Chew    Quit date: 10/20/1992  Vaping Use  . Vaping Use: Never used  Substance Use Topics  . Alcohol use: No    Alcohol/week: 0.0 standard drinks  . Drug use: No    Home Medications Prior to Admission medications   Medication Sig Start Date End Date Taking? Authorizing Provider  aspirin 81 MG tablet Take 81 mg by mouth daily.   Yes [provider]  carvedilol (COREG) 25 MG tablet TAKE 1 TABLET BY MOUTH TWICE A DAY Patient taking differently: Take 25 mg by mouth 2 (two)  times daily with a meal. 09/07/19  Yes Wellington Hampshire, MD  Cholecalciferol (VITAMIN D3) 25 MCG (1000 UT) CAPS Take 1 capsule (1,000 Units total) by mouth daily. 03/08/20  Yes Ria Bush, MD  doxycycline (VIBRA-TABS) 100 MG tablet Take 1 tablet (100 mg total) by mouth 2 (two) times daily for 10 days. 06/20/20 06/30/20 Yes Copland, Frederico Hamman, MD  Insulin Pen Needle (EXEL PEN NEEDLES 31GX1/4") 31G X 6 MM MISC Use as directed 02/08/12  Yes Ria Bush, MD  JANUMET XR [phone] MG TB24 TAKE 1 TABLET BY MOUTH ONCE DAILY Patient taking differently: Take 1 tablet by mouth daily. 02/29/20  Yes Ria Bush, MD  LANTUS 100 UNIT/ML injection INJECT 0.4 ML (40 UNITS TOTAL) INTO THE SKIN AT BEDTIME Patient taking differently: Inject 42 Units into the skin at bedtime. 05/09/20  Yes Ria Bush, MD  lovastatin (MEVACOR) 40 MG tablet TAKE ONE TABLET BY MOUTH EVERY NIGHT AT BEDTIME Patient taking differently: Take 40 mg by mouth at bedtime. 03/14/20  Yes Ria Bush, MD  [facility] ULTRA test strip CHECK BLOOD SUGAR ONCE OR TWICE DAILY ASDIRECTED 07/14/19  Yes Ria Bush, MD  ramipril (ALTACE) 10 MG capsule TAKE 1 CAPSULE BY MOUTH ONCE DAILY Patient taking differently: Take 10 mg by mouth daily. 04/19/20  Yes Ria Bush, MD  TRUEPLUS INSULIN SYRINGE 31G X 5/16" 1 ML MISC USE TO INJECT INSULIN AS DIRECTED 05/03/20  Yes Ria Bush, MD  ondansetron (ZOFRAN ODT) 4 MG disintegrating tablet Take 1 tablet (4 mg total) by mouth every 8 (eight) hours as needed for nausea or vomiting. Patient not taking: No sig reported 04/06/20   Carrie Mew, MD    Allergies    Metformin and related and Percocet [oxycodone-acetaminophen]  Review of Systems   Review of Systems  Constitutional: Negative for fever.  Eyes: Negative for visual disturbance.  Respiratory: Positive for cough. Negative for shortness of breath.   Cardiovascular: Negative for chest pain.  Gastrointestinal:  Positive for nausea and vomiting.  Musculoskeletal: Positive for neck pain.  Neurological: Positive for dizziness and headaches. Negative for weakness and numbness.  All other systems reviewed and are negative.   Physical Exam Updated Vital Signs BP 136/63   Pulse (!) 107   Temp 98.1 F (36.7 C) (Oral)   Resp 17   Ht 5\' 9"  (1.753 m)   Wt 74.4 kg   SpO2 96%   BMI 24.22 kg/m   Physical Exam Vitals and nursing note reviewed.  Constitutional:      General: He is not in acute distress.    Appearance: He is well-developed and well-nourished. He is not ill-appearing or diaphoretic.  HENT:     Head: Normocephalic and atraumatic.     Right Ear: External ear normal.     Left Ear: External ear normal.     Nose: Nose normal.  Eyes:     General:        Right eye: No discharge.        Left eye: No discharge.     Extraocular Movements: Extraocular movements intact.     Pupils: Pupils are equal, round, and reactive to light.  Neck:      Comments: Patient can range his neck diffusely, but this causes nausea Cardiovascular:     Rate and Rhythm: Normal rate and regular rhythm.     Heart sounds: Normal heart sounds.  Pulmonary:     Effort: Pulmonary effort is normal.     Breath sounds: Normal breath sounds.  Abdominal:     Palpations: Abdomen is soft.     Tenderness: There is no abdominal tenderness.  Musculoskeletal:        General: No edema.     Cervical back: Normal range of motion and neck supple. Muscular tenderness present.  Skin:    General: Skin is warm and dry.  Neurological:  Mental Status: He is alert.     Comments: CN 3-12 grossly intact. 5/5 strength in all 4 extremities. Grossly normal sensation. Normal finger to nose.   Psychiatric:        Mood and Affect: Mood is not anxious.     ED Results / Procedures / Treatments   Labs (all labs ordered are listed, but only abnormal results are displayed) Labs Reviewed  BASIC METABOLIC PANEL - Abnormal; Notable for  the following components:      Result Value   Glucose, Bld 146 (*)    All other components within normal limits  URINALYSIS, ROUTINE W REFLEX MICROSCOPIC - Abnormal; Notable for the following components:   APPearance HAZY (*)    Glucose, UA 150 (*)    Ketones, ur 80 (*)    Protein, ur 100 (*)    All other components within normal limits  CBC    EKG EKG Interpretation  Date/Time:  Thursday June 23 2020 20:50:27 EST Ventricular Rate:  109 PR Interval:  132 QRS Duration: 138 QT Interval:  381 QTC Calculation: 514 R Axis:   11 Text Interpretation: Sinus tachycardia Left bundle branch block Baseline wander in lead(s) II III aVF Confirmed by Sherwood Gambler [phone]) on 06/23/2020 9:11:30 PM   Radiology CT Angio Head W or Wo Contrast  Result Date: 06/23/2020 CLINICAL DATA:  74 year old male with dizziness, headache. EXAM: CT ANGIOGRAPHY HEAD AND NECK TECHNIQUE: Multidetector CT imaging of the head and neck was performed using the standard protocol during bolus administration of intravenous contrast. Multiplanar CT image reconstructions and MIPs were obtained to evaluate the vascular anatomy. Carotid stenosis measurements (when applicable) are obtained utilizing NASCET criteria, using the distal internal carotid diameter as the denominator. CONTRAST:  69mL OMNIPAQUE IOHEXOL 350 MG/ML SOLN COMPARISON:  Head CT 1316 hours today. FINDINGS: CTA NECK Skeleton: Mild to moderate scoliosis. Right shoulder arthroplasty on the scout view. Associated mild streak artifact. No acute osseous abnormality identified. Upper chest: Negative. Other neck: Thyroid is negative for age. Negative CT appearance of the neck elsewhere. Aortic arch: 3 vessel arch configuration.  No arch atherosclerosis. Right carotid system: Negative. Left carotid system: Negative. Vertebral arteries: Proximal right subclavian artery and right vertebral artery origin are normal. Dominant right vertebral artery is patent and normal to the  skull base. Minimal proximal left subclavian plaque without stenosis. Normal left vertebral artery origin. Mildly non dominant left vertebral artery is patent and normal to the skull base. CTA HEAD Posterior circulation: Calcified bilateral V4 vertebral artery plaque, with moderate to severe stenosis on the left (series 7, image 145). No significant stenosis on the right. Patent vertebrobasilar junction. Diminutive but symmetric bilateral PICA, AICA enhancing vessels. Patent basilar artery, tortuous. Normal SCA and PCA origins. Posterior communicating arteries are diminutive or absent. Bilateral PCA branches appear normal aside from tortuosity. Anterior circulation: Both ICA siphons are patent. Mild calcified plaque bilaterally without siphon stenosis. Normal ophthalmic artery origins. Patent carotid termini, MCA and ACA origins. Diminutive anterior communicating artery. Bilateral ACA branches are within normal limits. Left MCA M1 segment and bifurcation are patent without stenosis. Mildly tortuous left MCA branches, otherwise normal. Right MCA M1 segment and trifurcation are patent without stenosis. Right MCA branches are within normal limits. Venous sinuses: Early contrast timing, not well evaluated. Anatomic variants: Mildly dominant right vertebral artery. Review of the MIP images confirms the above findings IMPRESSION: 1. Negative for arterial dissection or large vessel occlusion. 2. 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 3. No other significant arterial stenosis in the head or neck. Electronically Signed   By: Genevie Ann M.D.   On: 06/23/2020 21:41   DG Chest 2 View  Result Date: 06/23/2020 CLINICAL DATA:  Cough EXAM: CHEST - 2 VIEW COMPARISON:  04/06/2020 FINDINGS: The heart size and mediastinal contours are within normal limits. Both lungs are clear. The visualized skeletal structures are unremarkable. The patient is status post  prior shoulder arthroplasty on the right. IMPRESSION: No active cardiopulmonary disease. Electronically Signed   By: Constance Holster M.D.   On: 06/23/2020 20:25   CT HEAD WO CONTRAST  Result Date: 06/23/2020 CLINICAL DATA:  Dizziness, headache EXAM: CT HEAD WITHOUT CONTRAST TECHNIQUE: Contiguous axial images were obtained from the base of the skull through the vertex without intravenous contrast. COMPARISON:  None. FINDINGS: Brain: Old right basal ganglia lacunar infarct. There is atrophy and chronic small vessel disease changes. No acute intracranial abnormality. Specifically, no hemorrhage, hydrocephalus, mass lesion, acute infarction, or significant intracranial injury. Vascular: No hyperdense vessel or unexpected calcification. Skull: No acute calvarial abnormality. Sinuses/Orbits: Mucosal thickening throughout the paranasal sinuses. No air-fluid level. Other: None IMPRESSION: Old right basal ganglia lacunar infarct. Atrophy, chronic microvascular disease. No acute intracranial abnormality. Electronically Signed   By: Rolm Baptise M.D.   On: 06/23/2020 13:37   CT Angio Neck W and/or Wo Contrast  Result Date: 06/23/2020 CLINICAL DATA:  74 year old male with dizziness, headache. EXAM: CT ANGIOGRAPHY HEAD AND NECK TECHNIQUE: Multidetector CT imaging of the head and neck was performed using the standard protocol during bolus administration of intravenous contrast. Multiplanar CT image reconstructions and MIPs were obtained to evaluate the vascular anatomy. Carotid stenosis measurements (when applicable) are obtained utilizing NASCET criteria, using the distal internal carotid diameter as the denominator. CONTRAST:  87mL OMNIPAQUE IOHEXOL 350 MG/ML SOLN COMPARISON:  Head CT 1316 hours today. FINDINGS: CTA NECK Skeleton: Mild to moderate scoliosis. Right shoulder arthroplasty on the scout view. Associated mild streak artifact. No acute osseous abnormality identified. Upper chest: Negative. Other neck:  Thyroid is negative for age. Negative CT appearance of the neck elsewhere. Aortic arch: 3 vessel arch configuration.  No arch atherosclerosis. Right carotid system: Negative. Left carotid system: Negative. Vertebral arteries: Proximal right subclavian artery and right vertebral artery origin are normal. Dominant right vertebral artery is patent and normal to the skull base. Minimal proximal left subclavian plaque without stenosis. Normal left vertebral artery origin. Mildly non dominant left vertebral artery is patent and normal to the skull base. CTA HEAD Posterior circulation: Calcified bilateral V4 vertebral artery plaque, with moderate to severe stenosis on the left (series 7, image 145). No significant stenosis on the right. Patent vertebrobasilar junction. Diminutive but symmetric bilateral PICA, AICA enhancing vessels. Patent basilar artery, tortuous. Normal SCA and PCA origins. Posterior communicating arteries are diminutive or absent. Bilateral PCA branches appear normal aside from tortuosity. Anterior circulation: Both ICA siphons are patent. Mild calcified plaque bilaterally without siphon stenosis. Normal ophthalmic artery origins. Patent carotid termini, MCA and ACA origins. Diminutive anterior communicating artery. Bilateral ACA branches are within normal limits. Left MCA M1 segment and bifurcation are patent without stenosis. Mildly tortuous left MCA branches, otherwise normal. Right MCA M1 segment and trifurcation are patent without stenosis. Right MCA branches are within normal limits. Venous sinuses: Early contrast timing, not well evaluated. Anatomic variants: Mildly dominant right vertebral artery. Review of the MIP images confirms the above findings IMPRESSION: 1.  Negative for arterial dissection or large vessel occlusion. 2. 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 3. No other significant arterial stenosis in the head or  neck. Electronically Signed   By: Genevie Ann M.D.   On: 06/23/2020 21:41    Procedures Procedures (including critical care time)  Medications Ordered in ED Medications  sodium chloride 0.9 % bolus 1,000 mL (0 mLs Intravenous Stopped 06/23/20 2316)  diphenhydrAMINE (BENADRYL) injection 12.5 mg (12.5 mg Intravenous Given 06/23/20 2101)  metoCLOPramide (REGLAN) injection 5 mg (5 mg Intravenous Given 06/23/20 2100)  iohexol (OMNIPAQUE) 350 MG/ML injection 75 mL (75 mLs Intravenous Contrast Given 06/23/20 2124)  meclizine (ANTIVERT) tablet 25 mg (25 mg Oral Given 06/23/20 2317)  diazepam (VALIUM) injection 2.5 mg (2.5 mg Intravenous Given 06/23/20 2318)    ED Course  I have reviewed the triage vital signs and the nursing notes.  Pertinent labs & imaging results that were available during my care of the patient were reviewed by me and considered in my medical decision making (see chart for details).    MDM Rules/Calculators/A&P                          Patient is not altered or febrile.  My suspicion of acute CNS infection is pretty low.  CT head has been reviewed and is benign.  Thus CTA was obtained given the neck pain component and what sounds like vertigo with any slight movement causing nausea.  This does not show acute occlusion or dissection but does show some vertebral artery stenosis.  MRI will be obtained given intractable symptoms despite originally treating his headache.  Care transferred to Dr. Ronnald Nian.  Will depend on MRI results and his symptoms as to whether or not he can go home or has to be admitted. Final Clinical Impression(s) / ED Diagnoses Final diagnoses:  None    Rx / DC Orders ED Discharge Orders    None       Sherwood Gambler, MD 06/23/20 2330

## 2020-06-23 NOTE — ED Triage Notes (Signed)
Pt presents for HA and nausea x 2 weeks.  Started abx three days ago for productive cough.  Had COVID test that was negative.  HA so severe it has made him vomit.  Worse with positions.  Pain primarily in sinus area and back of head.  Reports intermittent, mild dizziness and loss of balance.

## 2020-06-23 NOTE — Telephone Encounter (Signed)
Noted. Seen at [facility]>> sent to ER. Pending eval.

## 2020-06-23 NOTE — Telephone Encounter (Signed)
Per appt notes pt already has video visit with Dr Glori Bickers 06/23/20 at 12 noon.

## 2020-06-23 NOTE — ED Provider Notes (Signed)
Assumed care of patient at 11 PM.  Patient awaiting MRI to further work-up headache, dizziness.  Has had some improvement with medications here but still slightly symptomatic.  CTA of head and neck overall unremarkable.  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  Has had symptoms for about a week.  No fever, no white count.  No concern for meningitis or infectious process.  Overall MRI was unremarkable.  No stroke.  Following headache cocktail and vertigo cocktail patient feeling better.  Able to ambulate without any issues in the room.  Did have some shared decision making about possibly admission for further physical therapy evaluation and symptomatic treatment but overall decided to discharge patient with prescription for meclizine, Zofran and continue to treat vertigo symptoms outpatient.  He did have some stenosis of a vertebral artery do not believe that that was the immediate cause of his symptoms today.  However we will have him discuss with his primary care doctor about possibly seeing neurosurgery outpatient possibly vascular surgery.  Was given information for them as well.  Given information for ENT.  Understands return precautions.  This chart was dictated using voice recognition software.  Despite best efforts to proofread,  errors can occur which can change the documentation meaning.     Lennice Sites, DO 06/24/20 [phone]

## 2020-06-23 NOTE — ED Triage Notes (Signed)
Pt reports headache with dizziness and nausea x2 weeks. Pt seen at [facility] in [place] and was sent here for further eval/CT scan. Pt a/ox4, speech clear, face symmetrical, intact neurologically. Denies visual changes.

## 2020-06-23 NOTE — ED Provider Notes (Signed)
Renae Gloss   MRN: [id number] DOB: 1945/12/27  Subjective:   Norman Marks is a 74 y.o. male presenting for 2-week history of persistent headache with nausea.  Headache is frontal and occipital, rated 8 out of 10 currently.  Patient has already tested for COVID-19 and was negative.  He started doxycycline 3 days ago for productive cough but has had no improvement.  In fact patient is getting worse and is now having vomiting, dizziness.  His wife reports that he has started to be a bit more forgetful.  He does have a history of well-controlled diabetes, last A1c was less than 8% in August of this year.  He does have a history of CAD, left bundle branch block, hyperlipidemia and hypertension.  He follows up with his cardiologist once yearly.  Denies chest pain, shortness of breath.  Family history is positive for MI in his sister, she passed for this.  No current facility-administered medications for this encounter.  Current Outpatient Medications:  .  aspirin 81 MG tablet, Take 81 mg by mouth daily., Disp: , Rfl:  .  carvedilol (COREG) 25 MG tablet, TAKE 1 TABLET BY MOUTH TWICE A DAY, Disp: 60 tablet, Rfl: 10 .  Cholecalciferol (VITAMIN D3) 25 MCG (1000 UT) CAPS, Take 1 capsule (1,000 Units total) by mouth daily., Disp: 30 capsule, Rfl:  .  doxycycline (VIBRA-TABS) 100 MG tablet, Take 1 tablet (100 mg total) by mouth 2 (two) times daily for 10 days., Disp: 20 tablet, Rfl: 0 .  Insulin Pen Needle (EXEL PEN NEEDLES 31GX1/4") 31G X 6 MM MISC, Use as directed, Disp: 100 each, Rfl: 3 .  JANUMET XR [phone] MG TB24, TAKE 1 TABLET BY MOUTH ONCE DAILY, Disp: 90 tablet, Rfl: 0 .  LANTUS 100 UNIT/ML injection, INJECT 0.4 ML (40 UNITS TOTAL) INTO THE SKIN AT BEDTIME, Disp: 10 mL, Rfl: 2 .  lovastatin (MEVACOR) 40 MG tablet, TAKE ONE TABLET BY MOUTH EVERY NIGHT AT BEDTIME, Disp: 90 tablet, Rfl: 3 .  ondansetron (ZOFRAN ODT) 4 MG disintegrating tablet, Take 1 tablet (4 mg total) by mouth every  8 (eight) hours as needed for nausea or vomiting., Disp: 20 tablet, Rfl: 0 .  ONETOUCH ULTRA test strip, CHECK BLOOD SUGAR ONCE OR TWICE DAILY ASDIRECTED, Disp: 100 each, Rfl: 3 .  ramipril (ALTACE) 10 MG capsule, TAKE 1 CAPSULE BY MOUTH ONCE DAILY, Disp: 90 capsule, Rfl: 1 .  TRUEPLUS INSULIN SYRINGE 31G X 5/16" 1 ML MISC, USE TO INJECT INSULIN AS DIRECTED, Disp: 100 each, Rfl: 1   Allergies  Allergen Reactions  . Metformin And Related Diarrhea    With higher doses, tolerating QD dosing well  . Percocet [Oxycodone-Acetaminophen] Itching    Past Medical History:  Diagnosis Date  . Basal cell carcinoma 02/10/2020   SEE MEDIA 8 PAGES OF PREVIOUS BCC  . BPH (benign prostatic hypertrophy)   . CAD (coronary artery disease) 2009   [name] (cardiolite 2009 with 70% stenosis of proximal ramus, not amenable to PCI, rec treat medically) --> [name]  . Cancer ([place])    basal cell skin cancer  . DDD (degenerative disc disease), lumbar    s/p surgery  . Diabetes type 2, uncontrolled ([place]) 1990s   referred to [employer] 09/2015  . Headache(784.0)   . History of adenomatous polyp of colon 10/2012  . History of nephrolithiasis 2012   L ureter, R renal pelvis  . HLD (hyperlipidemia)   . HTN (hypertension)    mild  . Hx  of migraines   . Left bundle branch block   . Osteoarthritis of [place] joint of thumb 2014   s/p injections ([place])  . Osteoarthritis of right shoulder 2015   s/p injections ([place])  . Osteopenia 06/2011   T score -3, completed 2 yrs for[place]o, T score improved to -2.2 (2015)  . 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   SEE MEDIA 8 PAGES OF PREVIOUS SCC AND TREATMENTS  . Vitamin D deficiency   . Wears hearing aid in both ears      Past Surgical History:  Procedure Laterality Date  . [place], 2008   lumbar; x 3, spinal fusion  . CARDIAC CATHETERIZATION  2009   70% stenosis ostium  . CARDIOVASCULAR STRESS TEST  2011   fixed defect inferior/septal walls without  reversibility, LVEF 43% post stress  . CATARACT EXTRACTION W/PHACO Right 03/09/2019   Procedure: CATARACT EXTRACTION PHACO AND INTRAOCULAR LENS PLACEMENT ([place]) RIGHT DIABETES;  Surgeon: Eulogio Bear, MD;  Location: [facility];  Service: Ophthalmology;  Laterality: Right;  Diabetic - insulin and oral meds  . CATARACT EXTRACTION W/PHACO Left 04/06/2019   Procedure: CATARACT EXTRACTION PHACO AND INTRAOCULAR LENS PLACEMENT ([facility]) LEFT DIABETIC 01:04.2     11.5%      8.58;  Surgeon: Eulogio Bear, MD;  Location: [facility];  Service: Ophthalmology;  Laterality: Left;  Diabetic - insulin and oral meds  . COLONOSCOPY  10/2012   tubular adenoma, mild diverticulosis, rpt 5 yrs [place])  . COLONOSCOPY  12/2017   4 polyps - SSP, TA, diverticulosis ([place])  . dexa  06/2011   osteoporosis T -2.6 spine, -1.9 hip  . dexa  07/2013   osteopenia T -2.2 femur, -1.2 spine  . FLEXIBLE SIGMOIDOSCOPY  2007   pt reported normal ([place])  . FOOT SURGERY Left 08/2017  . HAND SURGERY Left 10/2015   [place] arthritis repair [place])  . KNEE SURGERY Right   . POLYPECTOMY    . SHOULDER SURGERY Right   . SKIN SURGERY  2006   skin cancer  . TENDON REPAIR  08/2017  . TOTAL SHOULDER ARTHROPLASTY Right 01/26/2014   Procedure: TOTAL SHOULDER ARTHROPLASTY;  Surgeon: Johnny Bridge, MD;  Location: [facility];  Service: Orthopedics;  Laterality: Right;  . US ECHOCARDIOGRAPHY  10/2008   nl LV fxn, EF 50%, mild MR, mild pulm HTN    Family History  Problem Relation Age of Onset  . Cancer Father        lung and prostate  . Diabetes Father   . Heart disease Mother   . Cancer Mother        unsure  . Coronary artery disease Sister 11  . Heart attack Sister 33  . Diabetes Brother   . Stroke Neg Hx   . Colon cancer Neg Hx   . Colon polyps Neg Hx   . Esophageal cancer Neg Hx   . Rectal cancer Neg Hx   . Stomach cancer Neg Hx     Social History   Tobacco Use  . Smoking status: Former Smoker     Packs/day: 0.25    Years: 10.00    Pack years: 2.50    Types: Cigarettes    Quit date: 10/20/1992    Years since quitting: 27.6  . Smokeless tobacco: Former Systems developer    Types: Chew    Quit date: 10/20/1992  Vaping Use  . Vaping Use: Never used  Substance Use Topics  . Alcohol  use: No    Alcohol/week: 0.0 standard drinks  . Drug use: No    ROS   Objective:   Vitals: BP (!) 143/94 (BP Location: Left Arm)   Pulse 91   Temp 98 F (36.7 C) (Oral)   Resp 18   SpO2 98%   Physical Exam Constitutional:      General: He is not in acute distress.    Appearance: Normal appearance. He is well-developed and normal weight. He is not ill-appearing, toxic-appearing or diaphoretic.  HENT:     Head: Normocephalic and atraumatic.     Right Ear: Tympanic membrane, ear canal and external ear normal. There is no impacted cerumen.     Left Ear: Tympanic membrane, ear canal and external ear normal. There is no impacted cerumen.     Nose: Nose normal. No congestion or rhinorrhea.     Mouth/Throat:     Mouth: Mucous membranes are moist.     Pharynx: Oropharynx is clear. No oropharyngeal exudate or posterior oropharyngeal erythema.  Eyes:     General: No scleral icterus.       Right eye: No discharge.        Left eye: No discharge.     Extraocular Movements: Extraocular movements intact.     Conjunctiva/sclera: Conjunctivae normal.     Pupils: Pupils are equal, round, and reactive to light.  Cardiovascular:     Rate and Rhythm: Normal rate and regular rhythm.     Heart sounds: Normal heart sounds. No murmur heard. No friction rub. No gallop.   Pulmonary:     Effort: Pulmonary effort is normal. No respiratory distress.     Breath sounds: Normal breath sounds. No stridor. No wheezing, rhonchi or rales.  Musculoskeletal:     Cervical back: Normal range of motion and neck supple. No rigidity. No muscular tenderness.  Skin:    General: Skin is warm and dry.  Neurological:     General: No focal  deficit present.     Mental Status: He is alert and oriented to person, place, and time.     Cranial Nerves: No cranial nerve deficit.     Motor: No weakness.     Coordination: Coordination normal.     Gait: Gait normal.     Deep Tendon Reflexes: Reflexes normal.     Comments: Positive Romberg test.  Negative pronator drift.  Normal finger-to-nose test.  Psychiatric:        Mood and Affect: Mood normal.        Behavior: Behavior normal.        Thought Content: Thought content normal.        Judgment: Judgment normal.     Assessment and Plan :   PDMP not reviewed this encounter.  1. Severe headache   2. Dizziness   3. Nausea without vomiting   4. Cough     There is significant concern for patient having a stroke, intracranial process.  Discussed this with his wife and she prefers to drive him to [facility], does not want an ambulance.  Patient's wife contracts for safety and will take him to the emergency room now.   Jaynee Eagles, PA-C 06/23/20 1134

## 2020-06-23 NOTE — Telephone Encounter (Signed)
Called pt to get him ready for 12:00 MyChart visit with Dr. Glori Bickers.  Pt states he was already seen at [facility] and was sent to [facility] ED.  FYI to Dr. Glori Bickers.   Visit c/x.

## 2020-06-23 NOTE — Telephone Encounter (Signed)
He went to urgent care instead

## 2020-06-23 NOTE — Telephone Encounter (Signed)
[facility] Night - Client Nonclinical Telephone Record AccessNurse Client [place] Night - Client Client Site [place] Physician Ria Bush - MD Contact Type Call Who Is Calling Patient / Member / Family / Caregiver Caller Name Renny Remer Caller Phone Number [phone] Patient Name Norman Marks Patient DOB August 03, 1945 Call Type Message Only Information Provided Reason for Call Request to Schedule Office Appointment Initial Comment Caller states she is needing an appointment for her husband Additional Comment Provided office hours Disp. Time Disposition Final User 06/23/2020 7:58:25 AM General Information Provided Yes Vinnie Level Call Closed By: Vinnie Level Transaction Date/Time: 06/23/2020 7:56:26 AM (ET)

## 2020-06-23 NOTE — ED Notes (Signed)
Patient is being discharged from the Urgent Care and sent to the Emergency Department via private vehicle . Per Lina Sar, PA, patient is in need of higher level of care due to concern for stroke. Patient is aware and verbalizes understanding of plan of care.  Vitals:   06/23/20 1050  BP: (!) 143/94  Pulse: 91  Resp: 18  Temp: 98 F (36.7 C)  SpO2: 98%

## 2020-06-24 ENCOUNTER — Telehealth: Payer: Self-pay | Admitting: Family Medicine

## 2020-06-24 ENCOUNTER — Encounter: Payer: Self-pay | Admitting: Family Medicine

## 2020-06-24 DIAGNOSIS — I6509 Occlusion and stenosis of unspecified vertebral artery: Secondary | ICD-10-CM | POA: Insufficient documentation

## 2020-06-24 MED ORDER — ONDANSETRON HCL 4 MG PO TABS: 4.0000 mg | ORAL_TABLET | Freq: Three times a day (TID) | ORAL | 0 refills | Status: DC | PRN

## 2020-06-24 MED ORDER — MECLIZINE HCL 25 MG PO TABS: 25.0000 mg | ORAL_TABLET | Freq: Two times a day (BID) | ORAL | 0 refills | Status: DC | PRN

## 2020-06-24 MED ORDER — DEXAMETHASONE SODIUM PHOSPHATE 10 MG/ML IJ SOLN
10.0000 mg | Freq: Once | INTRAMUSCULAR | Status: AC
  Administered 2020-06-24: 10 mg via INTRAVENOUS
  Filled 2020-06-24: qty 1

## 2020-06-24 NOTE — Telephone Encounter (Signed)
Ok to see in office. Thank you!

## 2020-06-24 NOTE — Progress Notes (Signed)
Pt cancelled appt

## 2020-06-24 NOTE — Telephone Encounter (Signed)
Patient was seen at [facility] ER yesterday and patient was told to follow up with his PCP.  Patient was seen for a severe headache and nausea.  I scheduled patient an appointment with Dr.G on Monday at 12:00.  Can patient be seen in the office?  Patient prefers to be seen in the office.

## 2020-06-24 NOTE — Telephone Encounter (Signed)
Patient notified he can come in to the office.

## 2020-06-27 ENCOUNTER — Other Ambulatory Visit: Payer: Self-pay

## 2020-06-27 ENCOUNTER — Ambulatory Visit (INDEPENDENT_AMBULATORY_CARE_PROVIDER_SITE_OTHER): Payer: Medicare Other | Admitting: Family Medicine

## 2020-06-27 ENCOUNTER — Encounter: Payer: Self-pay | Admitting: Family Medicine

## 2020-06-27 VITALS — BP 126/70 | HR 82 | Temp 97.7°F | Ht 69.0 in | Wt 159.6 lb

## 2020-06-27 DIAGNOSIS — I6502 Occlusion and stenosis of left vertebral artery: Secondary | ICD-10-CM

## 2020-06-27 DIAGNOSIS — Z8673 Personal history of transient ischemic attack (TIA), and cerebral infarction without residual deficits: Secondary | ICD-10-CM | POA: Diagnosis not present

## 2020-06-27 DIAGNOSIS — Z8669 Personal history of other diseases of the nervous system and sense organs: Secondary | ICD-10-CM

## 2020-06-27 DIAGNOSIS — R519 Headache, unspecified: Secondary | ICD-10-CM

## 2020-06-27 DIAGNOSIS — R42 Dizziness and giddiness: Secondary | ICD-10-CM

## 2020-06-27 DIAGNOSIS — R112 Nausea with vomiting, unspecified: Secondary | ICD-10-CM | POA: Diagnosis not present

## 2020-06-27 NOTE — Assessment & Plan Note (Addendum)
Newly noted by head imaging.  Already on aspirin 81mg  daily. Also on lovastatin 40mg  daily.

## 2020-06-27 NOTE — Progress Notes (Signed)
Patient ID: Norman Marks, male    DOB: 08/07/1945, 74 y.o.   MRN: [id number]  This visit was conducted in person.  BP 126/70 (BP Location: Left Arm, Patient Position: Sitting, Cuff Size: Normal)    Pulse 82    Temp 97.7 F (36.5 C) (Temporal)    Ht 5\' 9"  (1.753 m)    Wt 159 lb 9 oz (72.4 kg)    SpO2 97%    BMI 23.56 kg/m   Orthostatic VS for the past 24 hrs (Last 3 readings):  BP- Lying BP- Standing at 3 minutes  06/27/20 1203 -- 138/78  06/27/20 1201 138/80 --    CC: hosp f/u visit  Subjective:   HPI: Norman Marks is a 74 y.o. male presenting on 06/27/2020 for Hospitalization Follow-up (Seen on 06/23/20 at [facility] due to severe HA.  Then sent to [facility] ED.  States HA is better but still c/o lightheadedness.  Feels like he's going to tumble over when bending forward. )   Recent ER visit for severe headache. Presented with 1 wk h/o occipital headache, dizziness worse with any significant head movements (can happen when he bends over), associated with nausea and vomiting. Records reviewed. Reassuring head CT, neck CTA, and brain MRI. 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 CTA showed mod-severe distal L vertebral artery stenosis (V4). MRI overall reassuring. Continues coughing intermittently.   Treated with HA and vertigo cocktail. Discharged on meclizine, zofran. He continues these.  ER suggested VVS eval for vertebral stenosis and ?ENT for dizziness.   He did receive doxycycline course for cough (virtual visit Dr Lorelei Pont 06/20/2020).  Had negative COVID test.   Notes worsening L>R hearing loss. No tinnitus.  Dizziness described as unsteadiness, lightheadness with position changes, not vertigo not syncope. He notes headache worsens when he looks up or reclines with neck extended.   He was upset about experience at the ER - waited 6+ hours.      Relevant past medical, surgical,  family and social history reviewed and updated as indicated. Interim medical history since our last visit reviewed. Allergies and medications reviewed and updated. Outpatient Medications Prior to Visit  Medication Sig Dispense Refill   aspirin 81 MG tablet Take 81 mg by mouth daily.     carvedilol (COREG) 25 MG tablet TAKE 1 TABLET BY MOUTH TWICE A DAY (Patient taking differently: Take 25 mg by mouth 2 (two) times daily with a meal.) 60 tablet 10   Cholecalciferol (VITAMIN D3) 25 MCG (1000 UT) CAPS Take 1 capsule (1,000 Units total) by mouth daily. 30 capsule    doxycycline (VIBRA-TABS) 100 MG tablet Take 1 tablet (100 mg total) by mouth 2 (two) times daily for 10 days. 20 tablet 0   Insulin Pen Needle (EXEL PEN NEEDLES 31GX1/4") 31G X 6 MM MISC Use as directed 100 each 3   JANUMET XR [phone] MG TB24 TAKE 1 TABLET BY MOUTH ONCE DAILY (Patient taking differently: Take 1 tablet by mouth daily.) 90 tablet 0   LANTUS 100 UNIT/ML injection INJECT 0.4 ML (40 UNITS TOTAL) INTO THE SKIN AT BEDTIME (Patient taking differently: Inject 42 Units into the skin at bedtime.) 10 mL 2   lovastatin (MEVACOR) 40 MG tablet TAKE ONE TABLET BY MOUTH EVERY NIGHT AT BEDTIME (Patient taking differently: Take 40 mg by mouth at bedtime.) 90 tablet 3   meclizine (ANTIVERT) 25 MG tablet Take 1  tablet (25 mg total) by mouth 2 (two) times daily as needed for up to 20 doses for dizziness or nausea. 20 tablet 0   ondansetron (ZOFRAN) 4 MG tablet Take 1 tablet (4 mg total) by mouth every 8 (eight) hours as needed for up to 20 doses for nausea or vomiting. 20 tablet 0   ONETOUCH ULTRA test strip CHECK BLOOD SUGAR ONCE OR TWICE DAILY ASDIRECTED 100 each 3   ramipril (ALTACE) 10 MG capsule TAKE 1 CAPSULE BY MOUTH ONCE DAILY (Patient taking differently: Take 10 mg by mouth daily.) 90 capsule 1   TRUEPLUS INSULIN SYRINGE 31G X 5/16" 1 ML MISC USE TO INJECT INSULIN AS DIRECTED 100 each 1   ondansetron (ZOFRAN ODT) 4 MG  disintegrating tablet Take 1 tablet (4 mg total) by mouth every 8 (eight) hours as needed for nausea or vomiting. 20 tablet 0   No facility-administered medications prior to visit.     Per HPI unless specifically indicated in ROS section below Review of Systems Objective:  BP 126/70 (BP Location: Left Arm, Patient Position: Sitting, Cuff Size: Normal)    Pulse 82    Temp 97.7 F (36.5 C) (Temporal)    Ht 5\' 9"  (1.753 m)    Wt 159 lb 9 oz (72.4 kg)    SpO2 97%    BMI 23.56 kg/m   Wt Readings from Last 3 Encounters:  06/27/20 159 lb 9 oz (72.4 kg)  06/23/20 164 lb (74.4 kg)  06/20/20 164 lb (74.4 kg)      Physical Exam Vitals and nursing note reviewed.  Constitutional:      Appearance: Normal appearance. He is not ill-appearing.  Neck:     Vascular: No carotid bruit.  Cardiovascular:     Rate and Rhythm: Normal rate and regular rhythm.     Pulses: Normal pulses.     Heart sounds: Normal heart sounds. No murmur heard.   Pulmonary:     Effort: Pulmonary effort is normal. No respiratory distress.     Breath sounds: Normal breath sounds. No wheezing, rhonchi or rales.  Musculoskeletal:     Cervical back: Normal range of motion and neck supple.     Right lower leg: No edema.     Left lower leg: No edema.  Skin:    General: Skin is warm and dry.     Capillary Refill: Capillary refill takes less than 2 seconds.     Findings: No rash.  Neurological:     General: No focal deficit present.     Mental Status: He is alert.     Cranial Nerves: Cranial nerves are intact.     Sensory: Sensation is intact.     Motor: Motor function is intact.     Coordination: Coordination is intact. Romberg sign negative.     Gait: Gait is intact.     Comments:  CN 2-12 intact except for hearing Sensation intact FTN intact EOMI   Psychiatric:        Mood and Affect: Mood normal.        Behavior: Behavior normal.       Results for orders placed or performed during the hospital encounter of  99/24/26  Basic metabolic panel  Result Value Ref Range   Sodium 141 135 - 145 mmol/L   Potassium 4.0 3.5 - 5.1 mmol/L   Chloride 103 98 - 111 mmol/L   CO2 27 22 - 32 mmol/L   Glucose, Bld 146 (H) 70 - 99 mg/dL  BUN 16 8 - 23 mg/dL   Creatinine, Ser 0.64 0.61 - 1.24 mg/dL   Calcium 9.6 8.9 - 10.3 mg/dL   GFR, Estimated >60 >60 mL/min   Anion gap 11 5 - 15  CBC  Result Value Ref Range   WBC 8.8 4.0 - 10.5 K/uL   RBC 4.70 4.22 - 5.81 MIL/uL   Hemoglobin 14.8 13.0 - 17.0 g/dL   HCT 43.2 39.0 - 52.0 %   MCV 91.9 80.0 - 100.0 fL   MCH 31.5 26.0 - 34.0 pg   MCHC 34.3 30.0 - 36.0 g/dL   RDW 11.8 11.5 - 15.5 %   Platelets 370 150 - 400 K/uL   nRBC 0.0 0.0 - 0.2 %  Urinalysis, Routine w reflex microscopic Urine, Clean Catch  Result Value Ref Range   Color, Urine YELLOW YELLOW   APPearance HAZY (A) CLEAR   Specific Gravity, Urine 1.023 1.005 - 1.030   pH 5.0 5.0 - 8.0   Glucose, UA 150 (A) NEGATIVE mg/dL   Hgb urine dipstick NEGATIVE NEGATIVE   Bilirubin Urine NEGATIVE NEGATIVE   Ketones, ur 80 (A) NEGATIVE mg/dL   Protein, ur 100 (A) NEGATIVE mg/dL   Nitrite NEGATIVE NEGATIVE   Leukocytes,Ua NEGATIVE NEGATIVE   RBC / HPF 0-5 0 - 5 RBC/hpf   WBC, UA 0-5 0 - 5 WBC/hpf   Bacteria, UA NONE SEEN NONE SEEN   Squamous Epithelial / LPF 0-5 0 - 5   Mucus PRESENT    Assessment & Plan:  This visit occurred during the SARS-CoV-2 public health emergency.  Safety protocols were in place, including screening questions prior to the visit, additional usage of staff PPE, and extensive cleaning of exam room while observing appropriate contact time as indicated for disinfecting solutions.   Problem List Items Addressed This Visit    Vertebral artery stenosis    Mod-severe stenosis of distal L vertebral artery found on neck CTA at ER.  Continue aspirin, statin.  Will refer to VVS for further evaluation.       Relevant Orders   Ambulatory referral to Vascular Surgery   Nausea & vomiting     Associated with headache, unsure cause. Not consistent with gastroenteritis. Improving. Continue zofran PRN.       Hx of migraines    Current symptoms not consistent with migraine.       History of CVA in adulthood    Newly noted by head imaging.  Already on aspirin 81mg  daily. Also on lovastatin 40mg  daily.       Headache    Of unclear cause at this time. See above.  As headache worsens with neck extension, ?vertebral stenosis contributing - refer to VVS.      Relevant Orders   Ambulatory referral to Vascular Surgery   Dizziness - Primary    Dizziness associated with headache. Denies true vertigo or syncope. Overall improved.  Will start with VVS eval, consider ENT vs neurology if ongoing dizziness.           No orders of the defined types were placed in this encounter.  Orders Placed This Encounter  Procedures   Ambulatory referral to Vascular Surgery    Referral Priority:   Routine    Referral Type:   Surgical    Referral Reason:   Specialty Services Required    Requested Specialty:   Vascular Surgery    Number of Visits Requested:   1    Patient Instructions  Continue aspirin daily given  noted stroke history.  I will refer you to vascular surgeon for futher evaluation of vertebral artery narrowing. Finish doxycycline course.  Let us know if not improving each day.    Follow up plan: Return if symptoms worsen or fail to improve.  Ria Bush, MD

## 2020-06-27 NOTE — Patient Instructions (Addendum)
Continue aspirin daily given noted stroke history.  I will refer you to vascular surgeon for futher evaluation of vertebral artery narrowing. Finish doxycycline course.  Let us know if not improving each day.

## 2020-06-27 NOTE — Assessment & Plan Note (Signed)
Current symptoms not consistent with migraine.

## 2020-06-28 ENCOUNTER — Other Ambulatory Visit: Payer: Self-pay | Admitting: Family Medicine

## 2020-06-28 DIAGNOSIS — R519 Headache, unspecified: Secondary | ICD-10-CM | POA: Insufficient documentation

## 2020-06-28 NOTE — Assessment & Plan Note (Addendum)
Associated with headache, unsure cause. Not consistent with gastroenteritis. Improving. Continue zofran PRN.

## 2020-06-28 NOTE — Assessment & Plan Note (Signed)
Dizziness associated with headache. Denies true vertigo or syncope. Overall improved.  Will start with VVS eval, consider ENT vs neurology if ongoing dizziness.

## 2020-06-28 NOTE — Assessment & Plan Note (Signed)
Of unclear cause at this time. See above.  As headache worsens with neck extension, ?vertebral stenosis contributing - refer to VVS.

## 2020-06-28 NOTE — Assessment & Plan Note (Signed)
Mod-severe stenosis of distal L vertebral artery found on neck CTA at ER.  Continue aspirin, statin.  Will refer to VVS for further evaluation.

## 2020-07-04 ENCOUNTER — Telehealth: Payer: Self-pay | Admitting: Family Medicine

## 2020-07-04 NOTE — Telephone Encounter (Signed)
Patient called in stating he'd like to speak with someone in regards to his referral as he is confused. Please advise.

## 2020-07-04 NOTE — Telephone Encounter (Signed)
Spoke with patient and discussed referral. Patient understands that [facility] office will call him directly to schedule. I confirmed with their office that they received referral and they said its under review, can take 7 to 10 business days for them to reach out to patient. They are scheduling at the end of January and beginning of February. I did call the [place] office and found out that they can see patient sooner. Patient would like the sooner appointment and agreed to switching to [facility] location. Their office is aware and will reach out to the patient to schedule.

## 2020-07-05 ENCOUNTER — Ambulatory Visit (INDEPENDENT_AMBULATORY_CARE_PROVIDER_SITE_OTHER): Payer: Medicare Other | Admitting: Vascular Surgery

## 2020-07-05 ENCOUNTER — Other Ambulatory Visit: Payer: Self-pay

## 2020-07-05 ENCOUNTER — Encounter (INDEPENDENT_AMBULATORY_CARE_PROVIDER_SITE_OTHER): Payer: Self-pay | Admitting: Vascular Surgery

## 2020-07-05 VITALS — BP 137/75 | HR 92 | Resp 16 | Ht 69.0 in | Wt 161.2 lb

## 2020-07-05 DIAGNOSIS — Z794 Long term (current) use of insulin: Secondary | ICD-10-CM

## 2020-07-05 DIAGNOSIS — E118 Type 2 diabetes mellitus with unspecified complications: Secondary | ICD-10-CM

## 2020-07-05 DIAGNOSIS — I10 Essential (primary) hypertension: Secondary | ICD-10-CM | POA: Diagnosis not present

## 2020-07-05 DIAGNOSIS — I6502 Occlusion and stenosis of left vertebral artery: Secondary | ICD-10-CM | POA: Diagnosis not present

## 2020-07-05 DIAGNOSIS — R42 Dizziness and giddiness: Secondary | ICD-10-CM | POA: Diagnosis not present

## 2020-07-05 DIAGNOSIS — R519 Headache, unspecified: Secondary | ICD-10-CM

## 2020-07-05 NOTE — Assessment & Plan Note (Signed)
blood pressure control important in reducing the progression of atherosclerotic disease. On appropriate oral medications.  

## 2020-07-05 NOTE — Assessment & Plan Note (Addendum)
He had a CT angiogram of the head and neck which I have reviewed as well.  On this scan, his carotid arteries are clean with no significant disease on either side.  His right vertebral artery is larger and appears fairly normal.  The distal left vertebral artery had at least a moderate stenosis present and this was a smaller vessel. I long discussion with the patient and his wife today.  This is highly unlikely to be the cause of his headaches and other symptoms.  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  We discussed that many people only have 1 vertebral artery in his right vertebral artery clearly appears to be dominant.  His carotid arteries are normal.  This is also in an area where no intervention would be feasible even if he was symptomatic from this.  Medical management with aspirin and a statin agent would be recommended.  No further work-up is planned.  Evaluation by neurology or neurosurgery would certainly be reasonable for his ongoing headaches and dizziness.

## 2020-07-05 NOTE — Assessment & Plan Note (Signed)
blood glucose control important in reducing the progression of atherosclerotic disease. Also, involved in wound healing. On appropriate medications.  

## 2020-07-05 NOTE — Assessment & Plan Note (Signed)
Unclear the etiology but highly unlikely to be a vascular origin.

## 2020-07-05 NOTE — Assessment & Plan Note (Signed)
Unclear the etiology but highly unlikely to be a vascular etiology.

## 2020-07-05 NOTE — Progress Notes (Signed)
Patient ID: Norman Marks, male   DOB: May 07, 1946, 74 y.o.   MRN: [id number]  Chief Complaint  Patient presents with  . New Patient (Initial Visit)    Ref Danise Mina stenosis of left vertebral artery    HPI Norman Marks is a 74 y.o. male.  I am asked to see the patient by 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  The patient reports persistent and ongoing headaches that have been present now for months.  He had a very bad episode a few weeks ago which prompted an urgent care visit that was then followed by an emergency room visit with multiple scans that I have reviewed.  He had an MRI of the head that did not show any acute intracranial issue.  He had a CT angiogram of the head and neck which I have reviewed as well.  On this scan, his carotid arteries are clean with no significant disease on either side.  His right vertebral artery is larger and appears fairly normal.  The distal left vertebral artery had at least a moderate stenosis present and this was a smaller vessel.  He was referred for this finding on CT scan.   Past Medical History:  Diagnosis Date  . Basal cell carcinoma 02/10/2020   SEE MEDIA 8 PAGES OF PREVIOUS BCC  . BPH (benign prostatic hypertrophy)   . CAD (coronary artery disease) 2009   [name] (cardiolite 2009 with 70% stenosis of proximal ramus, not amenable to PCI, rec treat medically) --> [name]  . Cancer ([place])    basal cell skin cancer  . DDD (degenerative disc disease), lumbar    s/p surgery  . Diabetes type 2, uncontrolled ([place]) 1990s   referred to [facility] 09/2015  . Headache(784.0)   . History of adenomatous polyp of colon 10/2012  . History of nephrolithiasis 2012   L ureter, R renal pelvis  . HLD (hyperlipidemia)   . HTN (hypertension)    mild  . Hx of migraines   . Left bundle branch block   . Osteoarthritis of [place] joint of thumb 2014   s/p injections ([place])  . Osteoarthritis of right shoulder 2015   s/p injections ([place])   . Osteopenia 06/2011   T score -3, completed 2 yrs forteo, T score improved to -2.2 (2015)  . 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   SEE MEDIA 8 PAGES OF PREVIOUS SCC AND TREATMENTS  . Vitamin D deficiency   . Wears hearing aid in both ears     Past Surgical History:  Procedure Laterality Date  . [place], 2008   lumbar; x 3, spinal fusion  . CARDIAC CATHETERIZATION  2009   70% stenosis ostium  . CARDIOVASCULAR STRESS TEST  2011   fixed defect inferior/septal walls without reversibility, LVEF 43% post stress  . CATARACT EXTRACTION W/PHACO Right 03/09/2019   Procedure: CATARACT EXTRACTION PHACO AND INTRAOCULAR LENS PLACEMENT ([place]) RIGHT DIABETES;  Surgeon: Eulogio Bear, MD;  Location: [facility];  Service: Ophthalmology;  Laterality: Right;  Diabetic - insulin and oral meds  . CATARACT EXTRACTION W/PHACO Left 04/06/2019   Procedure: CATARACT EXTRACTION PHACO AND INTRAOCULAR LENS PLACEMENT (IOC) LEFT DIABETIC 01:04.2     11.5%      8.58;  Surgeon: Eulogio Bear, MD;  Location: [facility];  Service: Ophthalmology;  Laterality: Left;  Diabetic - insulin and oral meds  . COLONOSCOPY  10/2012   tubular adenoma, mild diverticulosis, rpt  5 yrs [place])  . COLONOSCOPY  12/2017   4 polyps - SSP, TA, diverticulosis ([place])  . dexa  06/2011   osteoporosis T -2.6 spine, -1.9 hip  . dexa  07/2013   osteopenia T -2.2 femur, -1.2 spine  . FLEXIBLE SIGMOIDOSCOPY  2007   pt reported normal ([place])  . FOOT SURGERY Left 08/2017  . HAND SURGERY Left 10/2015   [place] arthritis repair [place])  . KNEE SURGERY Right   . POLYPECTOMY    . SHOULDER SURGERY Right   . SKIN SURGERY  2006   skin cancer  . TENDON REPAIR  08/2017  . TOTAL SHOULDER ARTHROPLASTY Right 01/26/2014   Procedure: TOTAL SHOULDER ARTHROPLASTY;  Surgeon: Johnny Bridge, MD;  Location: [place];  Service: Orthopedics;  Laterality: Right;  . US ECHOCARDIOGRAPHY  10/2008   nl LV fxn, EF 50%, mild  MR, mild pulm HTN    Family History  Problem Relation Age of Onset  . Cancer Father        lung and prostate  . Diabetes Father   . Heart disease Mother   . Cancer Mother        unsure  . Coronary artery disease Sister 32  . Heart attack Sister 18  . Diabetes Brother   . Stroke Neg Hx   . Colon cancer Neg Hx   . Colon polyps Neg Hx   . Esophageal cancer Neg Hx   . Rectal cancer Neg Hx   . Stomach cancer Neg Hx      Social History   Tobacco Use  . Smoking status: Former Smoker    Packs/day: 0.25    Years: 10.00    Pack years: 2.50    Types: Cigarettes    Quit date: 10/20/1992    Years since quitting: 27.7  . Smokeless tobacco: Former Systems developer    Types: Chew    Quit date: 10/20/1992  Vaping Use  . Vaping Use: Never used  Substance Use Topics  . Alcohol use: No    Alcohol/week: 0.0 standard drinks  . Drug use: No    Allergies  Allergen Reactions  . Metformin And Related Diarrhea    With higher doses, tolerating QD dosing well  . Percocet [Oxycodone-Acetaminophen] Itching    Current Outpatient Medications  Medication Sig Dispense Refill  . aspirin 81 MG tablet Take 81 mg by mouth daily.    . carvedilol (COREG) 25 MG tablet TAKE 1 TABLET BY MOUTH TWICE A DAY (Patient taking differently: Take 25 mg by mouth 2 (two) times daily with a meal.) 60 tablet 10  . Cholecalciferol (VITAMIN D3) 25 MCG (1000 UT) CAPS Take 1 capsule (1,000 Units total) by mouth daily. 30 capsule   . Insulin Pen Needle (EXEL PEN NEEDLES 31GX1/4") 31G X 6 MM MISC Use as directed 100 each 3  . JANUMET XR [phone] MG TB24 TAKE 1 TABLET BY MOUTH ONCE DAILY (Patient taking differently: Take 1 tablet by mouth daily.) 90 tablet 0  . LANTUS 100 UNIT/ML injection INJECT 0.4 ML (40 UNITS TOTAL) INTO THE SKIN AT BEDTIME 10 mL 2  . lovastatin (MEVACOR) 40 MG tablet TAKE ONE TABLET BY MOUTH EVERY NIGHT AT BEDTIME (Patient taking differently: Take 40 mg by mouth at bedtime.) 90 tablet 3  . meclizine (ANTIVERT) 25  MG tablet Take 1 tablet (25 mg total) by mouth 2 (two) times daily as needed for up to 20 doses for dizziness or nausea. 20 tablet 0  . ondansetron (ZOFRAN) 4  MG tablet Take 1 tablet (4 mg total) by mouth every 8 (eight) hours as needed for up to 20 doses for nausea or vomiting. 20 tablet 0  . ONETOUCH ULTRA test strip CHECK BLOOD SUGAR ONCE OR TWICE DAILY ASDIRECTED 100 each 3  . ramipril (ALTACE) 10 MG capsule TAKE 1 CAPSULE BY MOUTH ONCE DAILY (Patient taking differently: Take 10 mg by mouth daily.) 90 capsule 1  . TRUEPLUS INSULIN SYRINGE 31G X 5/16" 1 ML MISC USE TO INJECT INSULIN AS DIRECTED 100 each 1   No current facility-administered medications for this visit.      REVIEW OF SYSTEMS (Negative unless checked)  Constitutional: [] Weight loss  [] Fever  [] Chills Cardiac: [] Chest pain   [] Chest pressure   [] Palpitations   [] Shortness of breath when laying flat   [] Shortness of breath at rest   [] Shortness of breath with exertion. Vascular:  [] Pain in legs with walking   [] Pain in legs at rest   [] Pain in legs when laying flat   [] Claudication   [] Pain in feet when walking  [] Pain in feet at rest  [] Pain in feet when laying flat   [] History of DVT   [] Phlebitis   [] Swelling in legs   [] Varicose veins   [] Non-healing ulcers Pulmonary:   [] Uses home oxygen   [] Productive cough   [] Hemoptysis   [] Wheeze  [] COPD   [] Asthma Neurologic:  [x] Dizziness  [] Blackouts   [] Seizures   [] History of stroke   [] History of TIA  [] Aphasia   [] Temporary blindness   [] Dysphagia   [] Weakness or numbness in arms   [] Weakness or numbness in legs X headaches Musculoskeletal:  [x] Arthritis   [] Joint swelling   [] Joint pain   [x] Low back pain Hematologic:  [] Easy bruising  [] Easy bleeding   [] Hypercoagulable state   [] Anemic  [] Hepatitis Gastrointestinal:  [] Blood in stool   [] Vomiting blood  [] Gastroesophageal reflux/heartburn   [] Abdominal pain Genitourinary:  [] Chronic kidney disease   [] Difficult urination   [] Frequent urination  [] Burning with urination   [] Hematuria Skin:  [] Rashes   [] Ulcers   [] Wounds Psychological:  [] History of anxiety   []  History of major depression.    Physical Exam BP 137/75 (BP Location: Right Arm)   Pulse 92   Resp 16   Ht 5\' 9"  (1.753 m)   Wt 161 lb 3.2 oz (73.1 kg)   BMI 23.81 kg/m  Gen:  WD/WN, NAD.  Appears younger than stated age Head: [place]/AT, No temporalis wasting.  Ear/Nose/Throat: Hearing grossly intact, nares w/o erythema or drainage, oropharynx w/o Erythema/Exudate Eyes: Conjunctiva clear, sclera non-icteric  Neck: trachea midline.  No JVD.  Pulmonary:  Good air movement, clear to auscultation bilaterally.  Cardiac: RRR, no JVD Vascular:  Vessel Right Left  Radial Palpable Palpable               Gastrointestinal: soft, non-tender/non-distended.  Musculoskeletal: M/S 5/5 throughout.  Extremities without ischemic changes.  No deformity or atrophy.  No edema. Neurologic: Sensation grossly intact in extremities.  Symmetrical.  Speech is fluent. Motor exam as listed above. Psychiatric: Judgment intact, Mood & affect appropriate for pt's clinical situation. Dermatologic: No rashes or ulcers noted.  No cellulitis or open wounds.    Radiology CT Angio Head W or Wo Contrast  Result Date: 06/23/2020 CLINICAL DATA:  74 year old male with dizziness, headache. EXAM: CT ANGIOGRAPHY HEAD AND NECK TECHNIQUE: Multidetector CT imaging of the head and neck was performed using the standard protocol during bolus administration of intravenous contrast.  Multiplanar CT image reconstructions and MIPs were obtained to evaluate the vascular anatomy. Carotid stenosis measurements (when applicable) are obtained utilizing NASCET criteria, using the distal internal carotid diameter as the denominator. CONTRAST:  37mL OMNIPAQUE IOHEXOL 350 MG/ML SOLN COMPARISON:  Head CT 1316 hours today. FINDINGS: CTA NECK Skeleton: Mild to moderate scoliosis. Right shoulder arthroplasty on  the scout view. Associated mild streak artifact. No acute osseous abnormality identified. Upper chest: Negative. Other neck: Thyroid is negative for age. Negative CT appearance of the neck elsewhere. Aortic arch: 3 vessel arch configuration.  No arch atherosclerosis. Right carotid system: Negative. Left carotid system: Negative. Vertebral arteries: Proximal right subclavian artery and right vertebral artery origin are normal. Dominant right vertebral artery is patent and normal to the skull base. Minimal proximal left subclavian plaque without stenosis. Normal left vertebral artery origin. Mildly non dominant left vertebral artery is patent and normal to the skull base. CTA HEAD Posterior circulation: Calcified bilateral V4 vertebral artery plaque, with moderate to severe stenosis on the left (series 7, image 145). No significant stenosis on the right. Patent vertebrobasilar junction. Diminutive but symmetric bilateral PICA, AICA enhancing vessels. Patent basilar artery, tortuous. Normal SCA and PCA origins. Posterior communicating arteries are diminutive or absent. Bilateral PCA branches appear normal aside from tortuosity. Anterior circulation: Both ICA siphons are patent. Mild calcified plaque bilaterally without siphon stenosis. Normal ophthalmic artery origins. Patent carotid termini, MCA and ACA origins. Diminutive anterior communicating artery. Bilateral ACA branches are within normal limits. Left MCA M1 segment and bifurcation are patent without stenosis. Mildly tortuous left MCA branches, otherwise normal. Right MCA M1 segment and trifurcation are patent without stenosis. Right MCA branches are within normal limits. Venous sinuses: Early contrast timing, not well evaluated. Anatomic variants: Mildly dominant right vertebral artery. Review of the MIP images confirms the above findings IMPRESSION: 1. Negative for arterial dissection or large vessel occlusion. 2. Positive for moderate to severe stenosis of the  distal Left Vertebral Artery (V4 segment) due to calcified plaque. 3. No other significant arterial stenosis in the head or neck. Electronically Signed   By: Genevie Ann M.D.   On: 06/23/2020 21:41   DG Chest 2 View  Result Date: 06/23/2020 CLINICAL DATA:  Cough EXAM: CHEST - 2 VIEW COMPARISON:  04/06/2020 FINDINGS: The heart size and mediastinal contours are within normal limits. Both lungs are clear. The visualized skeletal structures are unremarkable. The patient is status post prior shoulder arthroplasty on the right. IMPRESSION: No active cardiopulmonary disease. Electronically Signed   By: Constance Holster M.D.   On: 06/23/2020 20:25   CT HEAD WO CONTRAST  Result Date: 06/23/2020 CLINICAL DATA:  Dizziness, headache EXAM: CT HEAD WITHOUT CONTRAST TECHNIQUE: Contiguous axial images were obtained from the base of the skull through the vertex without intravenous contrast. COMPARISON:  None. FINDINGS: Brain: Old right basal ganglia lacunar infarct. There is atrophy and chronic small vessel disease changes. No acute intracranial abnormality. Specifically, no hemorrhage, hydrocephalus, mass lesion, acute infarction, or significant intracranial injury. Vascular: No hyperdense vessel or unexpected calcification. Skull: No acute calvarial abnormality. Sinuses/Orbits: Mucosal thickening throughout the paranasal sinuses. No air-fluid level. Other: None IMPRESSION: Old right basal ganglia lacunar infarct. Atrophy, chronic microvascular disease. No acute intracranial abnormality. Electronically Signed   By: Rolm Baptise M.D.   On: 06/23/2020 13:37   CT Angio Neck W and/or Wo Contrast  Result Date: 06/23/2020 CLINICAL DATA:  74 year old male with dizziness, headache. EXAM: CT ANGIOGRAPHY HEAD AND NECK TECHNIQUE: Multidetector CT  imaging of the head and neck was performed using the standard protocol during bolus administration of intravenous contrast. Multiplanar CT image reconstructions and MIPs were obtained to  evaluate the vascular anatomy. Carotid stenosis measurements (when applicable) are obtained utilizing NASCET criteria, using the distal internal carotid diameter as the denominator. CONTRAST:  61mL OMNIPAQUE IOHEXOL 350 MG/ML SOLN COMPARISON:  Head CT 1316 hours today. FINDINGS: CTA NECK Skeleton: Mild to moderate scoliosis. Right shoulder arthroplasty on the scout view. Associated mild streak artifact. No acute osseous abnormality identified. Upper chest: Negative. Other neck: Thyroid is negative for age. Negative CT appearance of the neck elsewhere. Aortic arch: 3 vessel arch configuration.  No arch atherosclerosis. Right carotid system: Negative. Left carotid system: Negative. Vertebral arteries: Proximal right subclavian artery and right vertebral artery origin are normal. Dominant right vertebral artery is patent and normal to the skull base. Minimal proximal left subclavian plaque without stenosis. Normal left vertebral artery origin. Mildly non dominant left vertebral artery is patent and normal to the skull base. CTA HEAD Posterior circulation: Calcified bilateral V4 vertebral artery plaque, with moderate to severe stenosis on the left (series 7, image 145). No significant stenosis on the right. Patent vertebrobasilar junction. Diminutive but symmetric bilateral PICA, AICA enhancing vessels. Patent basilar artery, tortuous. Normal SCA and PCA origins. Posterior communicating arteries are diminutive or absent. Bilateral PCA branches appear normal aside from tortuosity. Anterior circulation: Both ICA siphons are patent. Mild calcified plaque bilaterally without siphon stenosis. Normal ophthalmic artery origins. Patent carotid termini, MCA and ACA origins. Diminutive anterior communicating artery. Bilateral ACA branches are within normal limits. Left MCA M1 segment and bifurcation are patent without stenosis. Mildly tortuous left MCA branches, otherwise normal. Right MCA M1 segment and trifurcation are patent  without stenosis. Right MCA branches are within normal limits. Venous sinuses: Early contrast timing, not well evaluated. Anatomic variants: Mildly dominant right vertebral artery. Review of the MIP images confirms the above findings IMPRESSION: 1. Negative for arterial dissection or large vessel occlusion. 2. 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 3. No other significant arterial stenosis in the head or neck. Electronically Signed   By: Genevie Ann M.D.   On: 06/23/2020 21:41   MR BRAIN WO CONTRAST  Result Date: 06/24/2020 CLINICAL DATA:  Initial evaluation for acute dizziness. EXAM: MRI HEAD WITHOUT CONTRAST TECHNIQUE: Multiplanar, multiecho pulse sequences of the brain and surrounding structures were obtained without intravenous contrast. COMPARISON:  Prior CTA from 06/23/2020. FINDINGS: Brain: Cerebral volume within normal limits for age. Patchy T2/FLAIR hyperintensity involving the periventricular and deep white matter both cerebral hemispheres most consistent with chronic small vessel ischemic disease, moderate nature. Mild patchy involvement of the pons. Superimposed remote lacunar infarct present at the right basal ganglia with associated mild chronic hemosiderin staining. Additional small remote right thalamic lacunar infarct. Probable few additional tiny remote lacunar infarcts about the left basal ganglia. No abnormal foci of restricted diffusion to suggest acute or subacute ischemia. Gray-white matter differentiation maintained. No encephalomalacia to suggest chronic cortical infarction. No other evidence for acute or chronic intracranial hemorrhage. No mass lesion, midline shift or mass effect. No hydrocephalus or extra-axial fluid collection. Pituitary gland suprasellar region within normal limits. Midline structures intact. Vascular: Major intracranial vascular flow voids are well maintained. Skull and upper cervical spine:  Craniocervical junction normal. Bone marrow signal intensity within normal limits. No scalp soft tissue abnormality. Sinuses/Orbits: Patient status post bilateral ocular lens replacement.  Globes and orbital soft tissues demonstrate no acute finding. Mild chronic mucosal thickening noted throughout the ethmoidal air cells and maxillary sinuses. Trace right mastoid effusion noted, of doubtful significance. Inner ear structures grossly normal. Other: None. IMPRESSION: 1. No acute intracranial abnormality. 2. Age-related cerebral atrophy with moderate chronic small vessel ischemic disease, the right greater than left basal ganglia and right thalamus. Electronically Signed   By: Jeannine Boga M.D.   On: 06/24/2020 02:27    Labs Recent Results (from the past 2160 hour(s))  HM DIABETES EYE EXAM     Status: None   Collection Time: 05/11/20 12:00 AM  Result Value Ref Range   HM Diabetic Eye Exam No Retinopathy No Retinopathy    Comment: Repeat in 1 yr  Novel Coronavirus, NAA ([facility])     Status: None   Collection Time: 06/20/20 12:00 AM   Specimen: Oropharyngeal(OP) collection in vial transport medium   Oropharyngea  Result Value Ref Range   SARS-CoV-2, NAA Not Detected Not Detected    Comment: This nucleic acid amplification test was developed and its performance characteristics determined by [employer]. Nucleic acid amplification tests include RT-PCR and TMA. This test has not been FDA cleared or approved. This test has been authorized by FDA under an Emergency Use Authorization (EUA). This test is only authorized for the duration of time the declaration that circumstances exist justifying the authorization of the emergency use of in vitro diagnostic tests for detection of SARS-CoV-2 virus and/or diagnosis of COVID-19 infection under section 564(b)(1) of the Act, 21 U.S.C. 497WYO-3(Z) (1), unless the authorization is terminated or revoked sooner. When diagnostic testing is  negative, the possibility of a false negative result should be considered in the context of a patient's recent exposures and the presence of clinical signs and symptoms consistent with COVID-19. An individual without symptoms of COVID-19 and who is not shedding SARS-CoV-2 virus wo uld expect to have a negative (not detected) result in this assay.   SARS-COV-2, NAA 2 DAY TAT     Status: None   Collection Time: 06/20/20 12:00 AM   Oropharyngea  Result Value Ref Range   SARS-CoV-2, NAA 2 DAY TAT Performed   Specimen status report     Status: None   Collection Time: 06/20/20 12:00 AM  Result Value Ref Range   specimen status report Comment     Comment: Please note Please note The date and/or time of collection was not indicated on the requisition as required by state and federal law.  The date of receipt of the specimen was used as the collection date if not supplied.   Basic metabolic panel     Status: Abnormal   Collection Time: 06/23/20 12:32 PM  Result Value Ref Range   Sodium 141 135 - 145 mmol/L   Potassium 4.0 3.5 - 5.1 mmol/L   Chloride 103 98 - 111 mmol/L   CO2 27 22 - 32 mmol/L   Glucose, Bld 146 (H) 70 - 99 mg/dL    Comment: Glucose reference range applies only to samples taken after fasting for at least 8 hours.   BUN 16 8 - 23 mg/dL   Creatinine, Ser 0.64 0.61 - 1.24 mg/dL   Calcium 9.6 8.9 - 10.3 mg/dL   GFR, Estimated >60 >60 mL/min    Comment: (NOTE) Calculated using the CKD-EPI Creatinine Equation (2021)    Anion gap 11 5 - 15    Comment: Performed at [facility] [place],  [place] [id number]  CBC     Status: None   Collection Time: 06/23/20 12:32 PM  Result Value Ref Range   WBC 8.8 4.0 - 10.5 K/uL   RBC 4.70 4.22 - 5.81 MIL/uL   Hemoglobin 14.8 13.0 - 17.0 g/dL   HCT 43.2 39.0 - 52.0 %   MCV 91.9 80.0 - 100.0 fL   MCH 31.5 26.0 - 34.0 pg   MCHC 34.3 30.0 - 36.0 g/dL   RDW 11.8 11.5 - 15.5 %   Platelets 370 150 - 400 K/uL   nRBC  0.0 0.0 - 0.2 %    Comment: Performed at [facility], [place] [address], [place], [place] [address]  Urinalysis, Routine w reflex microscopic Urine, Clean Catch     Status: Abnormal   Collection Time: 06/23/20  4:00 PM  Result Value Ref Range   Color, Urine YELLOW YELLOW   APPearance HAZY (A) CLEAR   Specific Gravity, Urine 1.023 1.005 - 1.030   pH 5.0 5.0 - 8.0   Glucose, UA 150 (A) NEGATIVE mg/dL   Hgb urine dipstick NEGATIVE NEGATIVE   Bilirubin Urine NEGATIVE NEGATIVE   Ketones, ur 80 (A) NEGATIVE mg/dL   Protein, ur 100 (A) NEGATIVE mg/dL   Nitrite NEGATIVE NEGATIVE   Leukocytes,Ua NEGATIVE NEGATIVE   RBC / HPF 0-5 0 - 5 RBC/hpf   WBC, UA 0-5 0 - 5 WBC/hpf   Bacteria, UA NONE SEEN NONE SEEN   Squamous Epithelial / LPF 0-5 0 - 5   Mucus PRESENT     Comment: Performed at [facility], [place] [address], [place],  [address]    Assessment/Plan:  Vertebral artery stenosis  He had a CT angiogram of the head and neck which I have reviewed as well.  On this scan, his carotid arteries are clean with no significant disease on either side.  His right vertebral artery is larger and appears fairly normal.  The distal left vertebral artery had at least a moderate stenosis present and this was a smaller vessel. I long discussion with the patient and his wife today.  This is highly unlikely to be the cause of his headaches and other symptoms.  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  We discussed that many people only have 1 vertebral artery in his right vertebral artery clearly appears to be dominant.  His carotid arteries are normal.  This is also in an area where no intervention would be feasible even if he was symptomatic from this.  Medical management with aspirin and a statin agent would be recommended.  No further work-up is planned.  Evaluation by neurology or neurosurgery would certainly be reasonable for his ongoing headaches and  dizziness.  HTN (hypertension) blood pressure control important in reducing the progression of atherosclerotic disease. On appropriate oral medications.   Diabetes mellitus type 2, controlled, with complications ([place]) blood glucose control important in reducing the progression of atherosclerotic disease. Also, involved in wound healing. On appropriate medications.   Dizziness Unclear the etiology but highly unlikely to be a vascular origin.  Headache Unclear the etiology but highly unlikely to be a vascular etiology.      Leotis Pain 07/05/2020, 3:17 PM   This note was created with Dragon medical transcription system.  Any errors from dictation are unintentional.

## 2020-07-06 ENCOUNTER — Other Ambulatory Visit: Payer: Self-pay | Admitting: Family Medicine

## 2020-07-06 DIAGNOSIS — E118 Type 2 diabetes mellitus with unspecified complications: Secondary | ICD-10-CM

## 2020-07-07 NOTE — Telephone Encounter (Signed)
E-scribed refill 

## 2020-07-25 ENCOUNTER — Other Ambulatory Visit: Payer: Self-pay | Admitting: Family Medicine

## 2020-08-15 ENCOUNTER — Other Ambulatory Visit: Payer: Self-pay | Admitting: Cardiovascular Disease

## 2020-08-15 NOTE — Telephone Encounter (Signed)
Rx request sent to pharmacy.  

## 2020-09-09 ENCOUNTER — Encounter: Payer: Self-pay | Admitting: Family Medicine

## 2020-09-09 ENCOUNTER — Ambulatory Visit (INDEPENDENT_AMBULATORY_CARE_PROVIDER_SITE_OTHER): Payer: Medicare Other | Admitting: Family Medicine

## 2020-09-09 ENCOUNTER — Other Ambulatory Visit: Payer: Self-pay

## 2020-09-09 VITALS — BP 128/68 | HR 83 | Temp 97.5°F | Ht 69.0 in | Wt 165.6 lb

## 2020-09-09 DIAGNOSIS — Z794 Long term (current) use of insulin: Secondary | ICD-10-CM

## 2020-09-09 DIAGNOSIS — R42 Dizziness and giddiness: Secondary | ICD-10-CM

## 2020-09-09 DIAGNOSIS — I6502 Occlusion and stenosis of left vertebral artery: Secondary | ICD-10-CM | POA: Diagnosis not present

## 2020-09-09 DIAGNOSIS — E118 Type 2 diabetes mellitus with unspecified complications: Secondary | ICD-10-CM

## 2020-09-09 DIAGNOSIS — Z8673 Personal history of transient ischemic attack (TIA), and cerebral infarction without residual deficits: Secondary | ICD-10-CM | POA: Diagnosis not present

## 2020-09-09 LAB — POCT GLYCOSYLATED HEMOGLOBIN (HGB A1C): Hemoglobin A1C: 7.5 % — AB (ref 4.0–5.6)

## 2020-09-09 NOTE — Assessment & Plan Note (Addendum)
Incidental finding on imaging.  Continue aspirin, statin.

## 2020-09-09 NOTE — Progress Notes (Signed)
Patient ID: Norman Marks, male    DOB: 1945/10/03, 75 y.o.   MRN: [id number]  This visit was conducted in person.  BP 128/68   Pulse 83   Temp (!) 97.5 F (36.4 C) (Temporal)   Ht 5\' 9"  (1.753 m)   Wt 165 lb 9 oz (75.1 kg)   SpO2 100%   BMI 24.45 kg/m    CC: 6 mo DM f/u visit  Subjective:   HPI: Norman Marks is a 75 y.o. male presenting on 09/09/2020 for Diabetes (Here for 6 mo f/u.)   Late last year seen at ER with HA, dizziness - found to have mod-severe stenosis of distal L vertebral artery on CTA. S/p [facility] eval - distal L vertebral artery stenosis likely not causing headaches/dizziness - chronic appearing. Dizziness did improve.   H/o stroke incidentally found on CTA 06/2020 - continues aspirin and statin.   DM - does regularly check sugars fasting this morning 97, can be up to 150. Compliant with antihyperglycemic regimen which includes: lantus 45u daily qhs, janumet XR 100/1000mg  daily. Denies low sugars or hypoglycemic symptoms. Denies paresthesias. Last diabetic eye exam 04/2020. Pneumovax: 2011. Prevnar: 2015. Glucometer brand: onetouch. DSME: 09/2015. No h/o pancreatitis or thyroid cancer.  Lab Results  Component Value Date   HGBA1C 7.5 (A) 09/09/2020   Diabetic Foot Exam - Simple   Simple Foot Form Diabetic Foot exam was performed with the following findings: Yes 09/09/2020 11:02 AM  Visual Inspection No deformities, no ulcerations, no other skin breakdown bilaterally: Yes Sensation Testing Intact to touch and monofilament testing bilaterally: Yes Pulse Check Posterior Tibialis and Dorsalis pulse intact bilaterally: Yes Comments    Lab Results  Component Value Date   MICROALBUR 1.6 09/15/2015        Relevant past medical, surgical, family and social history reviewed and updated as indicated. Interim medical history since our last visit reviewed. Allergies and medications reviewed and updated. Outpatient Medications Prior to Visit  Medication Sig  Dispense Refill  . aspirin 81 MG tablet Take 81 mg by mouth daily.    . carvedilol (COREG) 25 MG tablet Take 1 tablet (25 mg total) by mouth 2 (two) times daily with a meal. 60 tablet 6  . Cholecalciferol (VITAMIN D3) 25 MCG (1000 UT) CAPS Take 1 capsule (1,000 Units total) by mouth daily. 30 capsule   . Insulin Pen Needle (EXEL PEN NEEDLES 31GX1/4") 31G X 6 MM MISC Use as directed 100 each 3  . JANUMET XR [phone] MG TB24 TAKE 1 TABLET BY MOUTH ONCE DAILY (Patient taking differently: Take 1 tablet by mouth daily.) 90 tablet 0  . lovastatin (MEVACOR) 40 MG tablet TAKE ONE TABLET BY MOUTH EVERY NIGHT AT BEDTIME (Patient taking differently: Take 40 mg by mouth at bedtime.) 90 tablet 3  . meclizine (ANTIVERT) 25 MG tablet Take 1 tablet (25 mg total) by mouth 2 (two) times daily as needed for up to 20 doses for dizziness or nausea. 20 tablet 0  . ondansetron (ZOFRAN) 4 MG tablet Take 1 tablet (4 mg total) by mouth every 8 (eight) hours as needed for up to 20 doses for nausea or vomiting. 20 tablet 0  . ONETOUCH ULTRA test strip CHECK BLOOD SUGAR ONCE OR TWICE DAILY ASDIRECTED 100 each 3  . ramipril (ALTACE) 10 MG capsule TAKE 1 CAPSULE BY MOUTH ONCE DAILY 90 capsule 1  . TRUEPLUS INSULIN SYRINGE 31G X 5/16" 1 ML MISC USE TO INJECT INSULIN AS DIRECTED 100  each 1  . LANTUS 100 UNIT/ML injection INJECT 0.4 ML (40 UNITS TOTAL) INTO THE SKIN AT BEDTIME (Patient taking differently: Injects 45 units at bedtime) 10 mL 2  . insulin glargine (LANTUS) 100 UNIT/ML injection Inject 0.45 mLs (45 Units total) into the skin at bedtime.     No facility-administered medications prior to visit.     Per HPI unless specifically indicated in ROS section below Review of Systems Objective:  BP 128/68   Pulse 83   Temp (!) 97.5 F (36.4 C) (Temporal)   Ht 5\' 9"  (1.753 m)   Wt 165 lb 9 oz (75.1 kg)   SpO2 100%   BMI 24.45 kg/m   Wt Readings from Last 3 Encounters:  09/09/20 165 lb 9 oz (75.1 kg)  07/05/20 161 lb  3.2 oz (73.1 kg)  06/27/20 159 lb 9 oz (72.4 kg)      Physical Exam Vitals and nursing note reviewed.  Constitutional:      General: He is not in acute distress.    Appearance: Normal appearance. He is well-developed and well-nourished. He is not ill-appearing.  HENT:     Mouth/Throat:     Mouth: Oropharynx is clear and moist.  Eyes:     General: No scleral icterus.    Extraocular Movements: Extraocular movements intact and EOM normal.     Conjunctiva/sclera: Conjunctivae normal.     Pupils: Pupils are equal, round, and reactive to light.  Cardiovascular:     Rate and Rhythm: Normal rate and regular rhythm.     Pulses: Normal pulses and intact distal pulses.     Heart sounds: Normal heart sounds. No murmur heard.   Pulmonary:     Effort: Pulmonary effort is normal. No respiratory distress.     Breath sounds: Normal breath sounds. No wheezing, rhonchi or rales.  Musculoskeletal:        General: No edema.     Right lower leg: No edema.     Left lower leg: No edema.     Comments: See HPI for foot exam if done  Lymphadenopathy:     Cervical: No cervical adenopathy.  Skin:    General: Skin is warm and dry.     Findings: No rash.  Neurological:     Mental Status: He is alert.  Psychiatric:        Mood and Affect: Mood and affect normal.       Results for orders placed or performed in visit on 09/09/20  POCT glycosylated hemoglobin (Hb A1C)  Result Value Ref Range   Hemoglobin A1C 7.5 (A) 4.0 - 5.6 %   HbA1c POC (<> result, manual entry)     HbA1c, POC (prediabetic range)     HbA1c, POC (controlled diabetic range)     Assessment & Plan:  This visit occurred during the SARS-CoV-2 public health emergency.  Safety protocols were in place, including screening questions prior to the visit, additional usage of staff PPE, and extensive cleaning of exam room while observing appropriate contact time as indicated for disinfecting solutions.   Problem List Items Addressed This  Visit    Vertebral artery stenosis    Saw [facility] ([facility]) 06/2020 - felt chronic issue, not causing disease as R side should be sufficient. No further intervention at this time, continue aspirin and statin.       History of CVA in adulthood    Incidental finding on imaging.  Continue aspirin, statin.       Dizziness  This did resolve on its own.       Diabetes mellitus type 2, controlled, with complications (Laura) - Primary    Chronic, overall adequate readings. Continue current regimen. Discussed possible transition to weekly GLP1 RA - he may be interested. Provided with names for him to look into. Will further discuss at next OV.       Relevant Medications   insulin glargine (LANTUS) 100 UNIT/ML injection   Other Relevant Orders   POCT glycosylated hemoglobin (Hb A1C) (Completed)       No orders of the defined types were placed in this encounter.  Orders Placed This Encounter  Procedures  . POCT glycosylated hemoglobin (Hb A1C)    Patient Instructions  You are doing well today Sugars remain a bit high but overall stable.  Continue current medicines Return as needed or in 6 months for physical/wellness visit   Follow up plan: Return in about 6 months (around 03/09/2021) for annual exam, prior fasting for blood work, medicare wellness visit.  Ria Bush, MD

## 2020-09-09 NOTE — Assessment & Plan Note (Signed)
Chronic, overall adequate readings. Continue current regimen. Discussed possible transition to weekly GLP1 RA - he may be interested. Provided with names for him to look into. Will further discuss at next OV.

## 2020-09-09 NOTE — Assessment & Plan Note (Signed)
Saw [facility] ([facility]) 06/2020 - felt chronic issue, not causing disease as R side should be sufficient. No further intervention at this time, continue aspirin and statin.

## 2020-09-09 NOTE — Assessment & Plan Note (Signed)
This did resolve on its own.

## 2020-09-09 NOTE — Patient Instructions (Signed)
You are doing well today Sugars remain a bit high but overall stable.  Continue current medicines Return as needed or in 6 months for physical/wellness visit

## 2020-09-27 ENCOUNTER — Other Ambulatory Visit: Payer: Self-pay | Admitting: Family Medicine

## 2020-10-07 ENCOUNTER — Other Ambulatory Visit: Payer: Self-pay | Admitting: Family Medicine

## 2020-10-07 DIAGNOSIS — E118 Type 2 diabetes mellitus with unspecified complications: Secondary | ICD-10-CM

## 2020-10-07 DIAGNOSIS — Z794 Long term (current) use of insulin: Secondary | ICD-10-CM

## 2020-11-15 ENCOUNTER — Other Ambulatory Visit: Payer: Self-pay | Admitting: Family Medicine

## 2020-12-03 IMAGING — CR DG CHEST 2V
1 series · 2 of 2 positions shown · non-contrast
Comparison: June 13, 2017

CLINICAL DATA: Chest pain

EXAM:
CHEST - 2 VIEW

[Series 1: dg chest 2 view · 0.14mm/px · 2 of 2 slices shown]
[im 1/2]
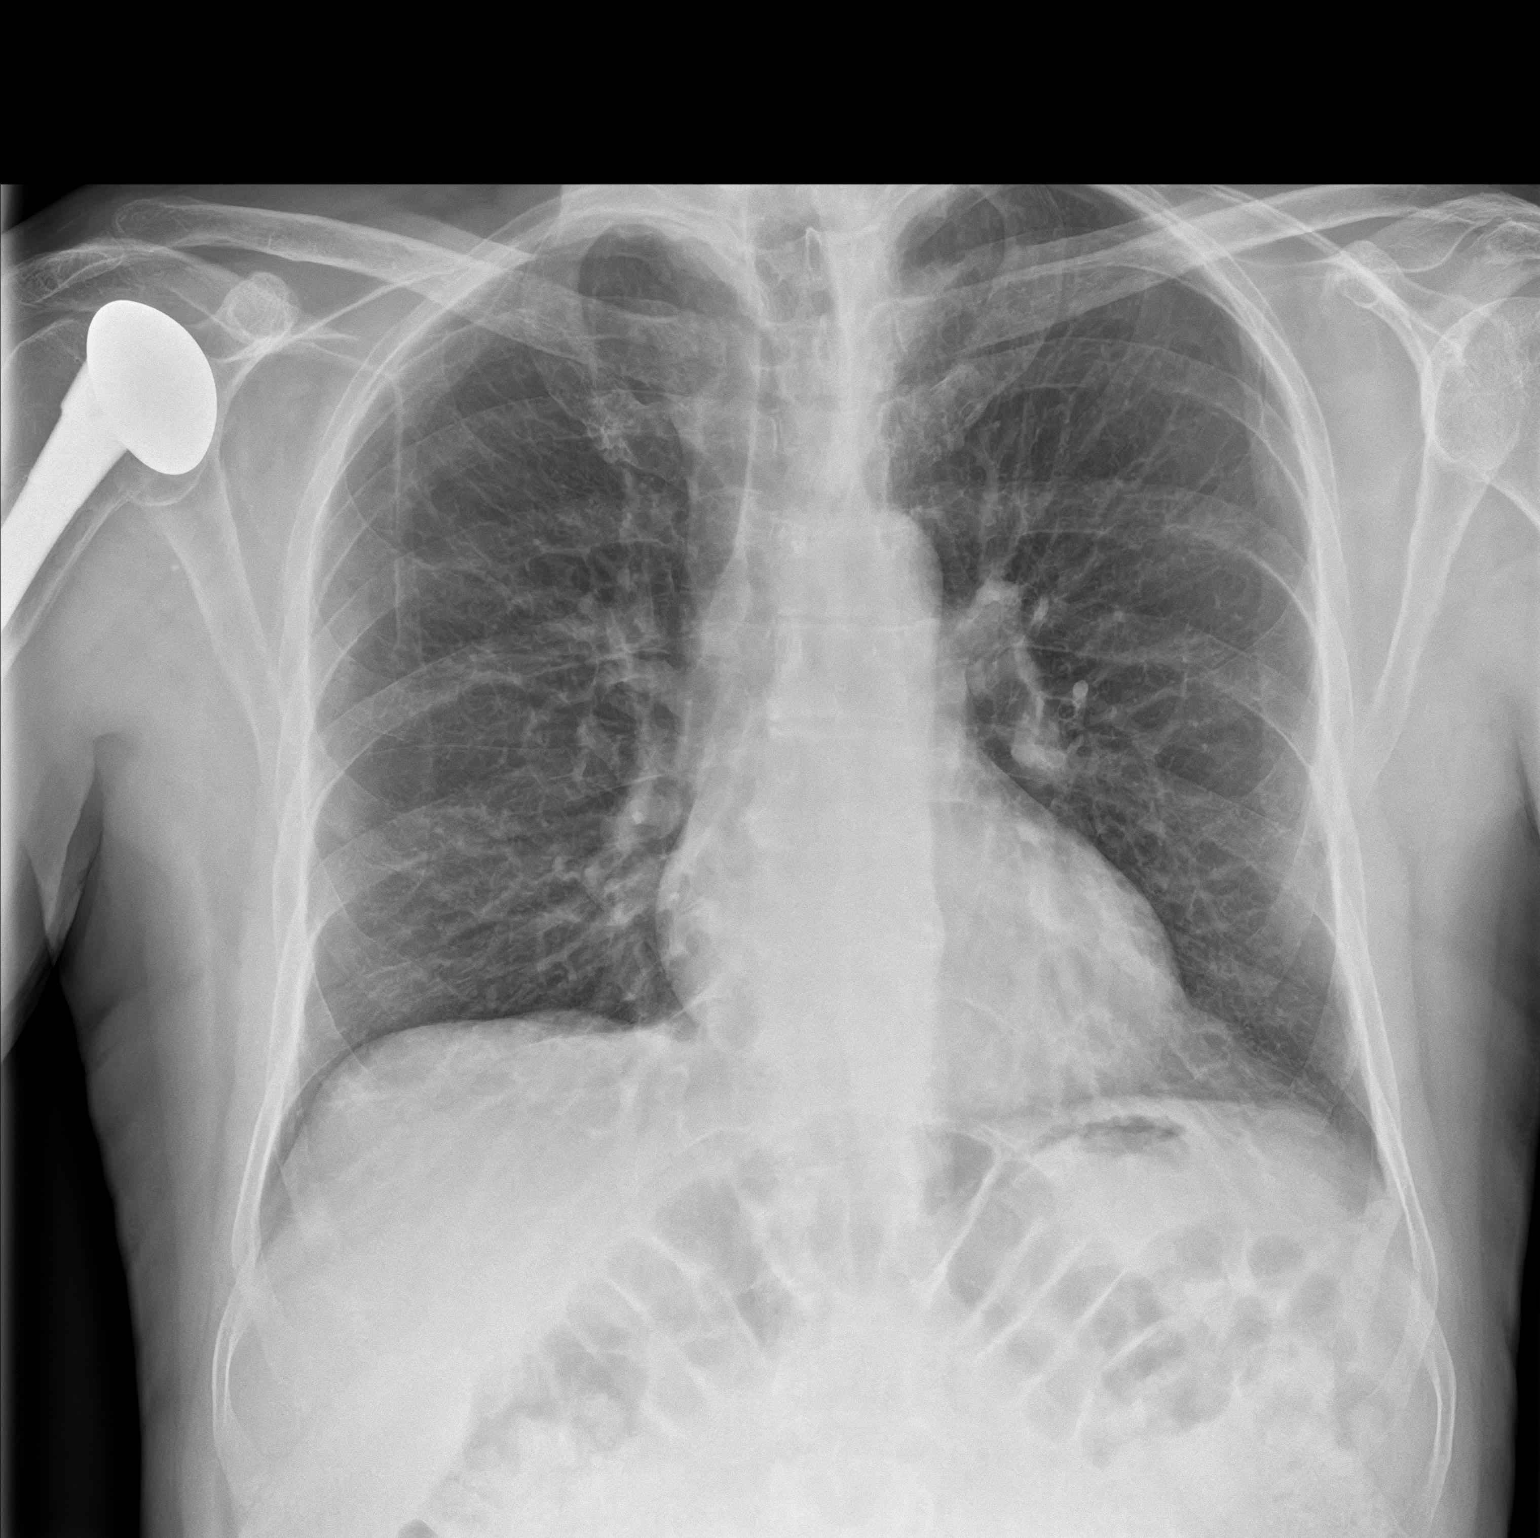
[im 2/2]
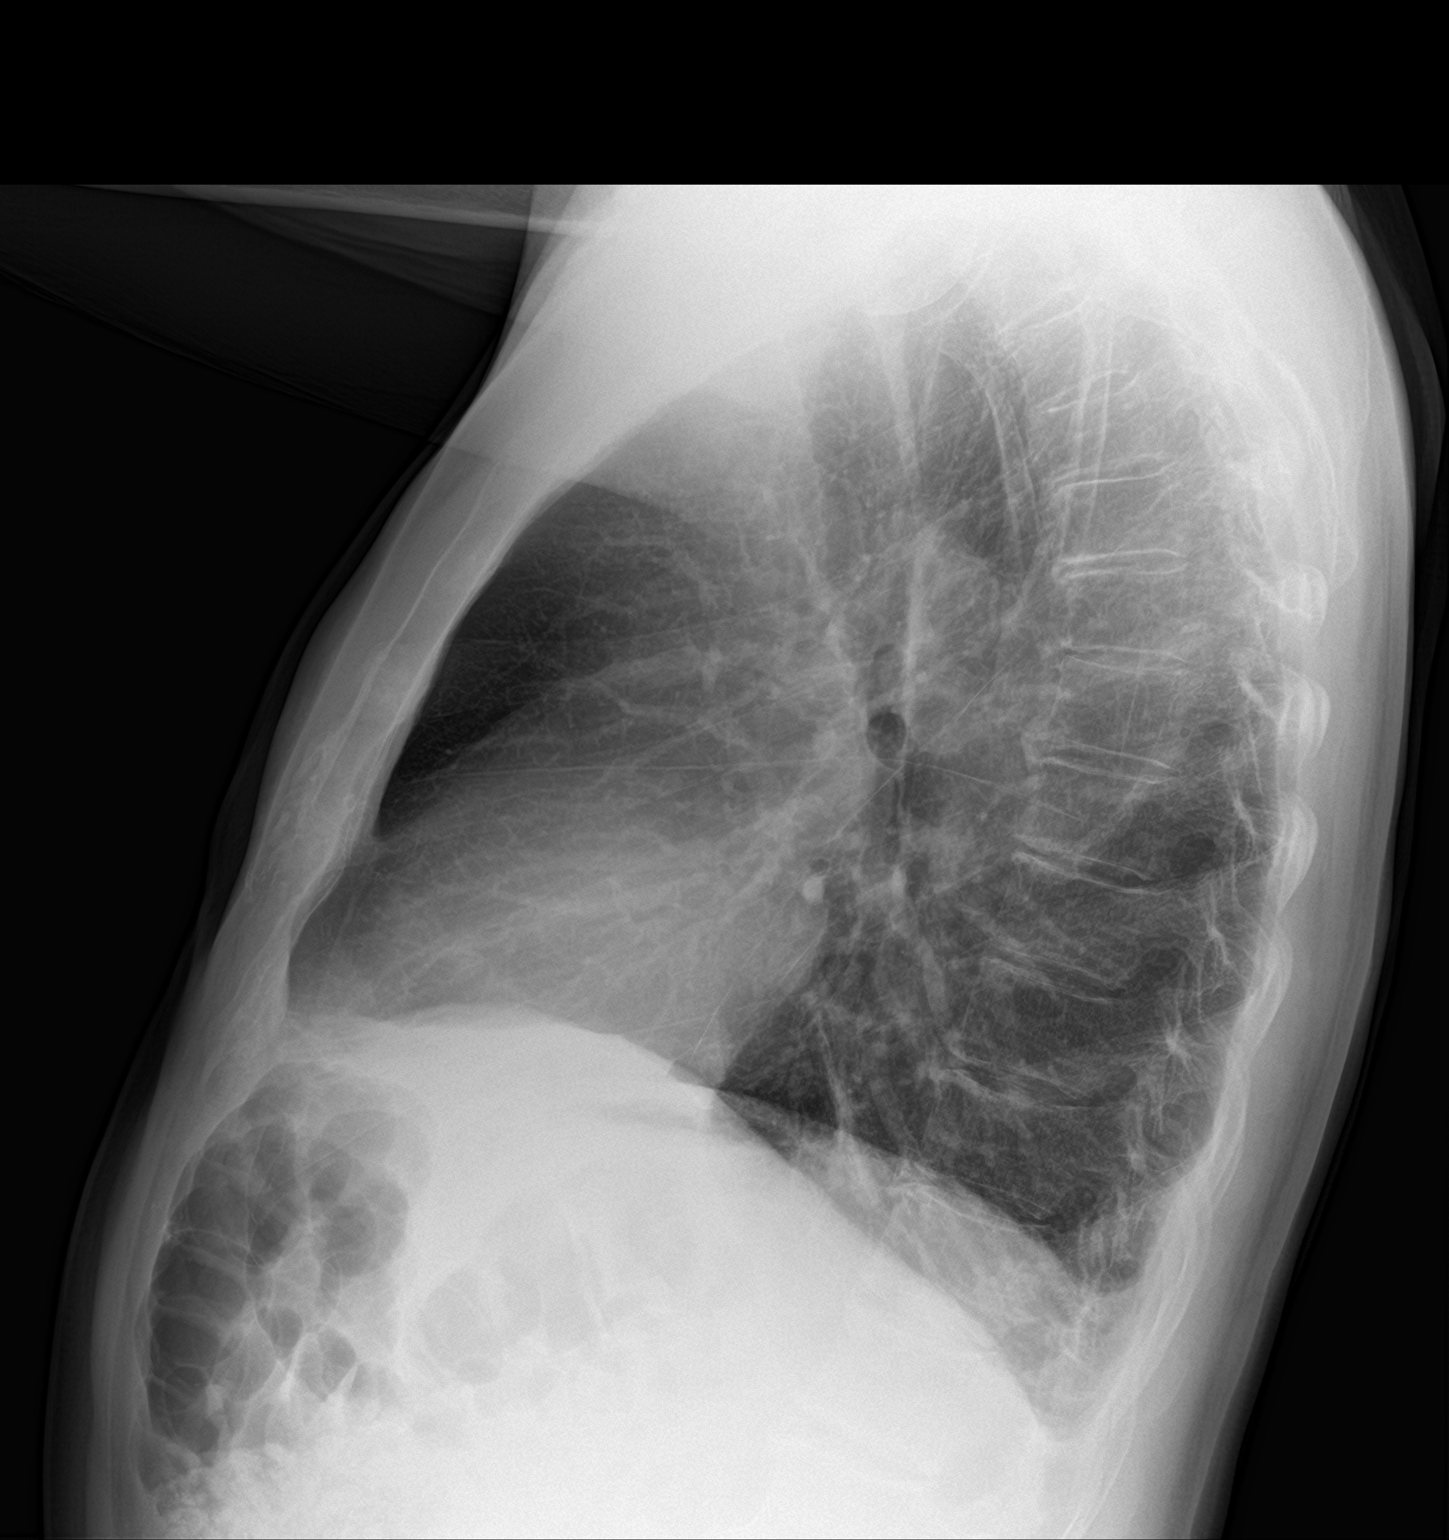

[2 of 2 positions shown; findings below may reference images not displayed]

FINDINGS: Lungs are clear. Heart size and pulmonary vascularity are normal. No
adenopathy. Total shoulder replacement noted on the right.
IMPRESSION: Lungs clear. Cardiac silhouette within normal limits. No adenopathy
demonstrable.

## 2020-12-13 ENCOUNTER — Other Ambulatory Visit: Payer: Self-pay | Admitting: Family Medicine

## 2021-01-02 ENCOUNTER — Other Ambulatory Visit: Payer: Self-pay | Admitting: Family Medicine

## 2021-02-19 IMAGING — CT CT HEAD W/O CM
4 series · 17 of 47 positions shown, 19 images · non-contrast
Comparison: None.

CLINICAL DATA: Dizziness, headache

EXAM:
CT HEAD WITHOUT CONTRAST
TECHNIQUE: Contiguous axial images were obtained from the base of the skull
through the vertex without intravenous contrast.

[Series 3: head without · axial · non-contrast · 0.42mm/px · z∈[-80,+40]mm · 7 of 33 slices shown, 9 images]
[im 5/33  brain]
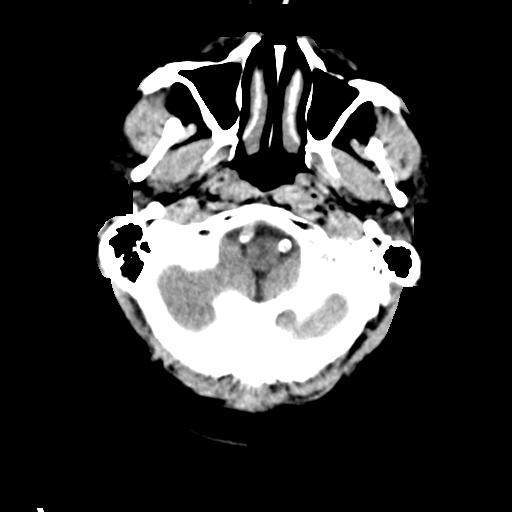
[im 5/33  bone]
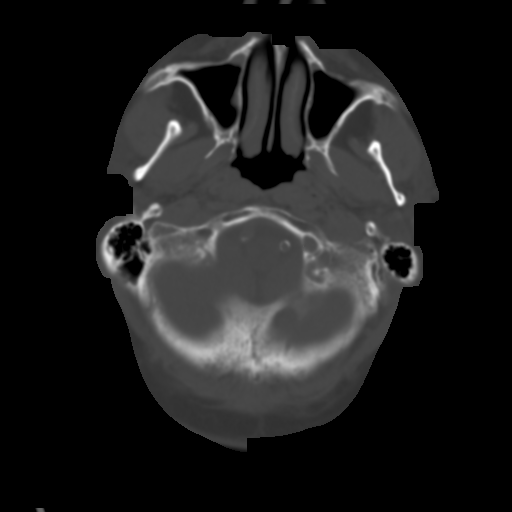
[im 9/33  brain]
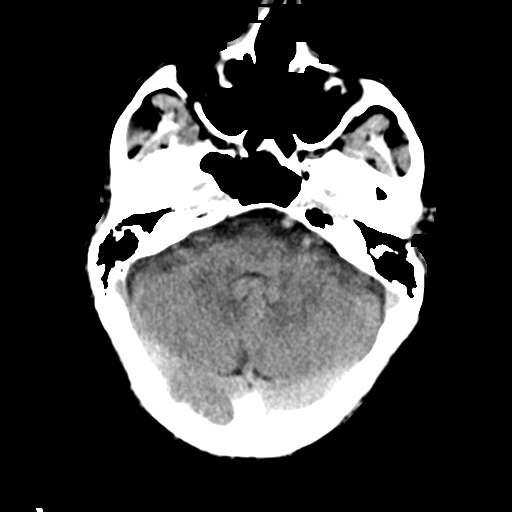
[im 13/33  brain]
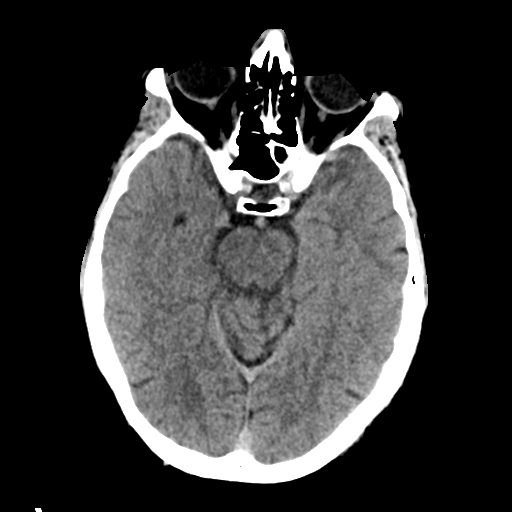
[im 17/33  brain]
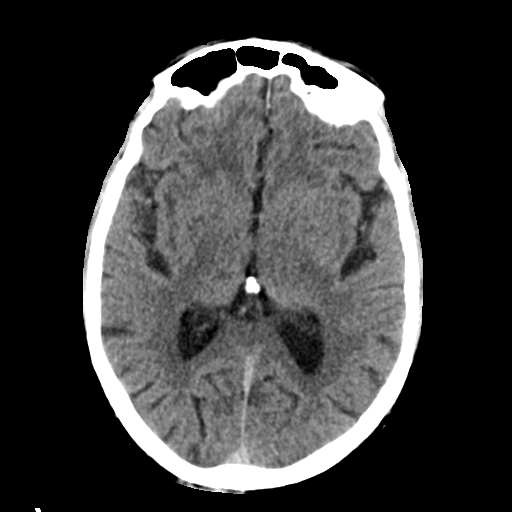
[im 21/33  brain]
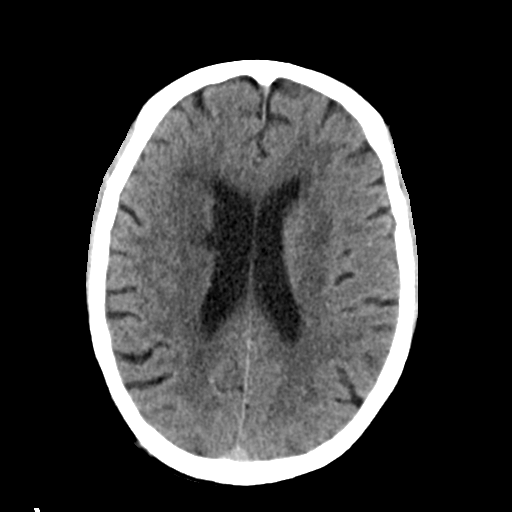
[im 21/33  bone]
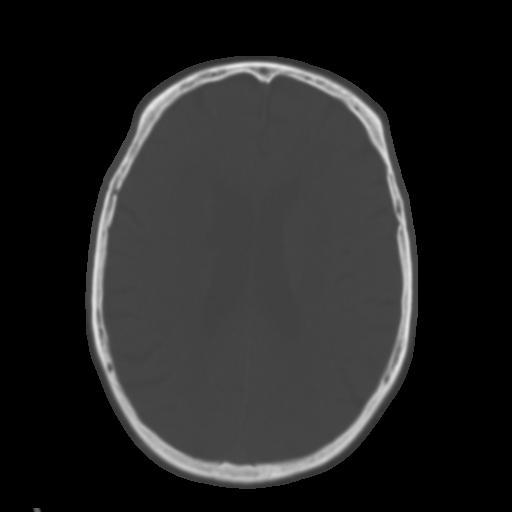
[im 25/33  brain]
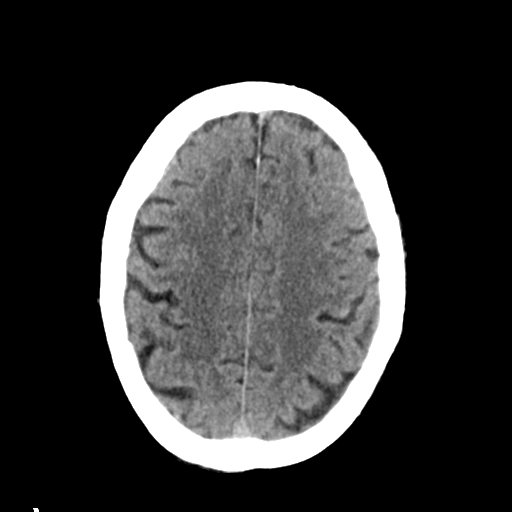
[im 29/33  brain]
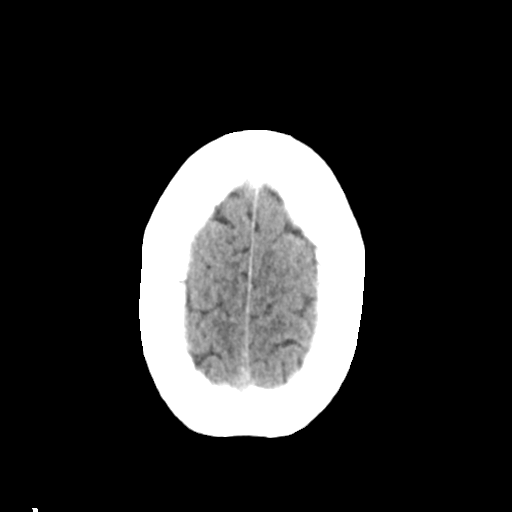

[Series 4: head bone · axial · 0.42mm/px · z∈[-84,-28]mm · 4 of 82 slices shown]
[im 9/82  bone]
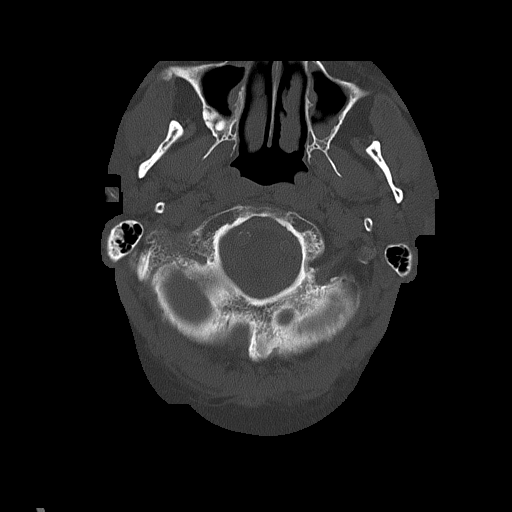
[im 17/82  bone]
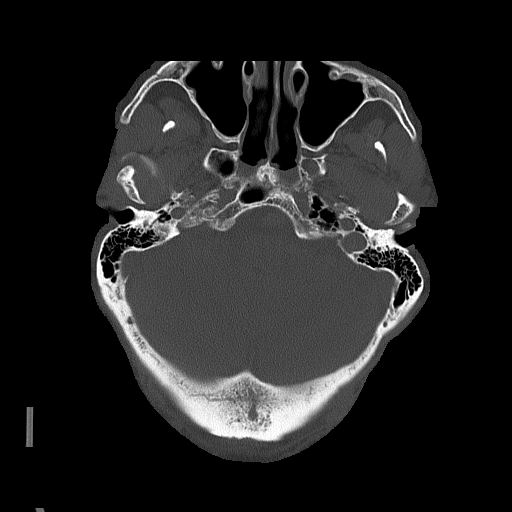
[im 25/82  bone]
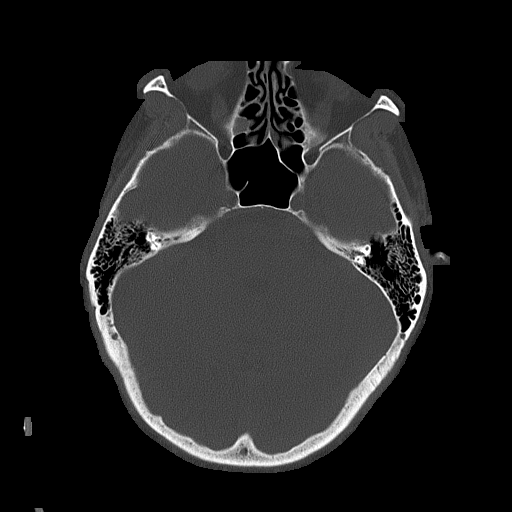
[im 37/82  bone]
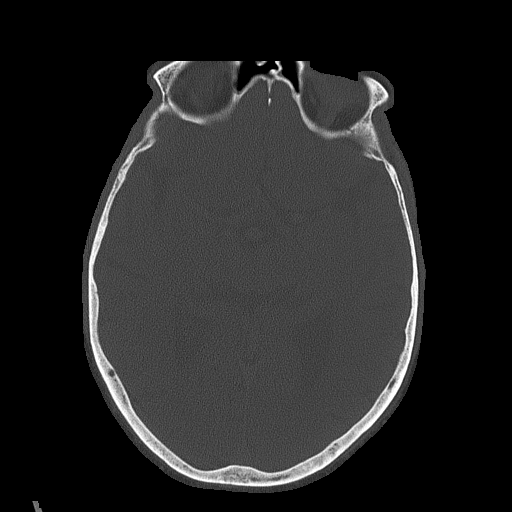

[Series 5: head without cor · coronal · non-contrast · 0.32mm/px · 3 of 68 slices shown]
[im 23/68  brain]
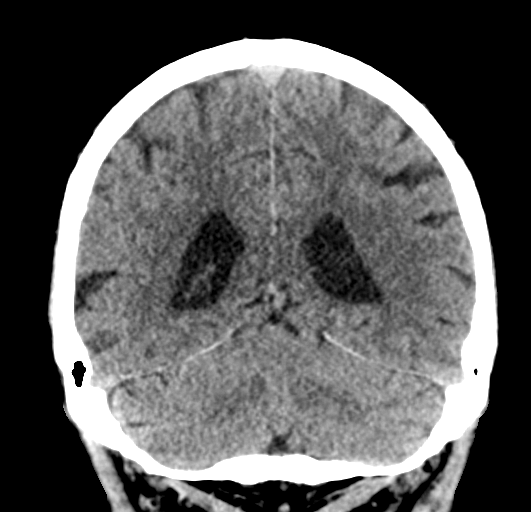
[im 30/68  brain]
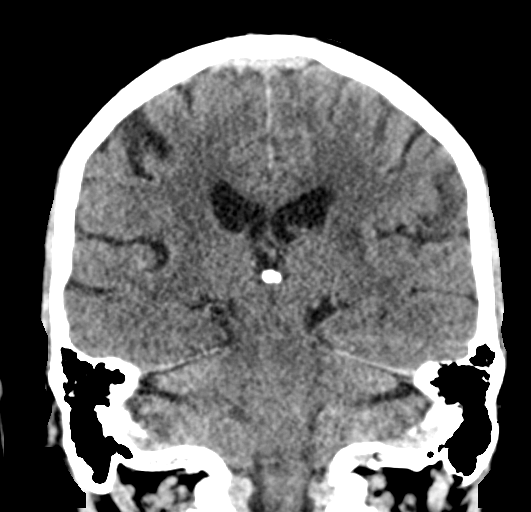
[im 38/68  brain]
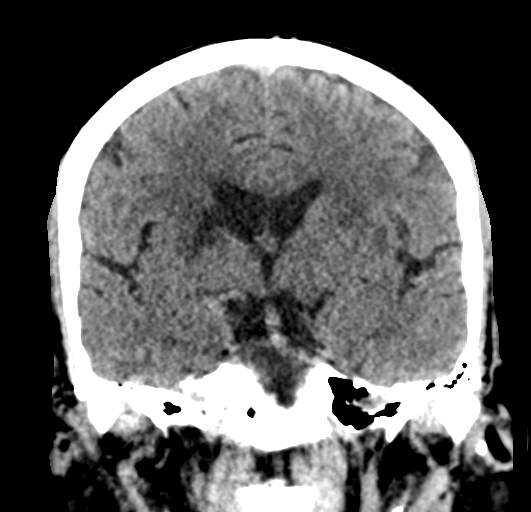

[Series 6: head without sag · sagittal · non-contrast · 0.32mm/px · 3 of 55 slices shown]
[im 19/55  brain]
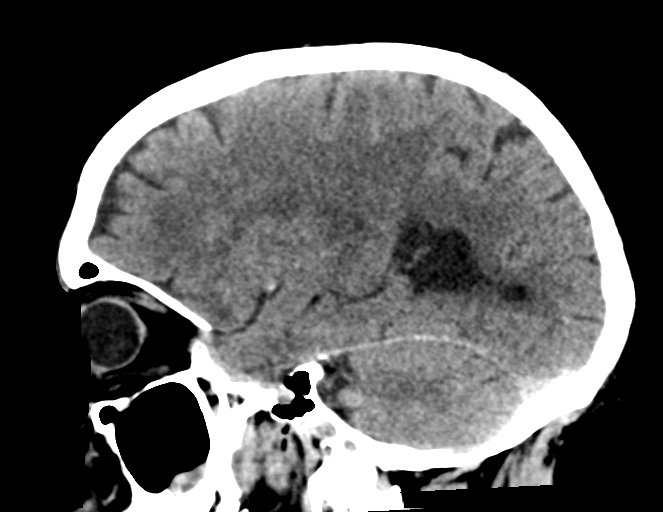
[im 28/55  brain]
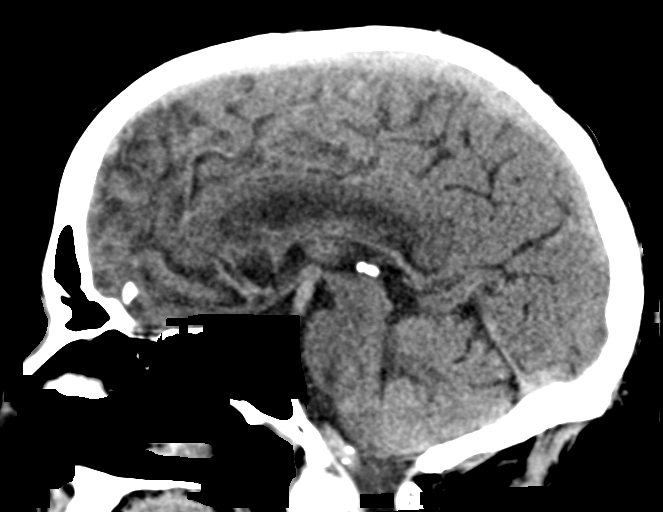
[im 37/55  brain]
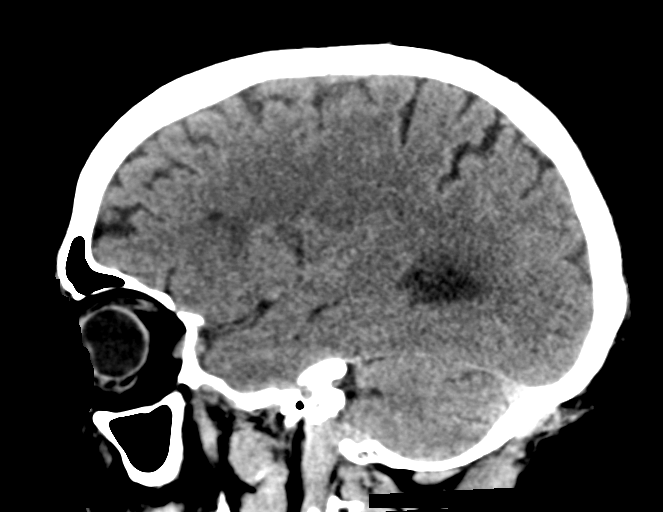

[17 of 47 positions shown; findings below may reference images not displayed]

FINDINGS: Brain: Old right basal ganglia lacunar infarct. There is atrophy and
chronic small vessel disease changes. No acute intracranial
abnormality. Specifically, no hemorrhage, hydrocephalus, mass
lesion, acute infarction, or significant intracranial injury.

Vascular: No hyperdense vessel or unexpected calcification.

Skull: No acute calvarial abnormality.

Sinuses/Orbits: Mucosal thickening throughout the paranasal sinuses.
No air-fluid level.

Other: None
IMPRESSION: Old right basal ganglia lacunar infarct.

Atrophy, chronic microvascular disease.

No acute intracranial abnormality.

## 2021-02-19 IMAGING — CT CT ANGIO NECK
2 of 7 series · 8 of 33 positions shown · IV contrast (APPLIED)
Comparison: Head CT 4246 hours today.

CLINICAL DATA: 74-year-old male with dizziness, headache.

EXAM:
CT ANGIOGRAPHY HEAD AND NECK
TECHNIQUE: Multidetector CT imaging of the head and neck was performed using
the standard protocol during bolus administration of intravenous
contrast. Multiplanar CT image reconstructions and MIPs were
obtained to evaluate the vascular anatomy. Carotid stenosis
measurements (when applicable) are obtained utilizing NASCET
criteria, using the distal internal carotid diameter as the
denominator.
CONTRAST:  75mL OMNIPAQUE IOHEXOL 350 MG/ML SOLN

[Series 5: cta neck/head · axial · 0.51mm/px · z∈[+1298,+1416]mm · 2 of 179 slices shown]
[im 60/179  soft-tissue]
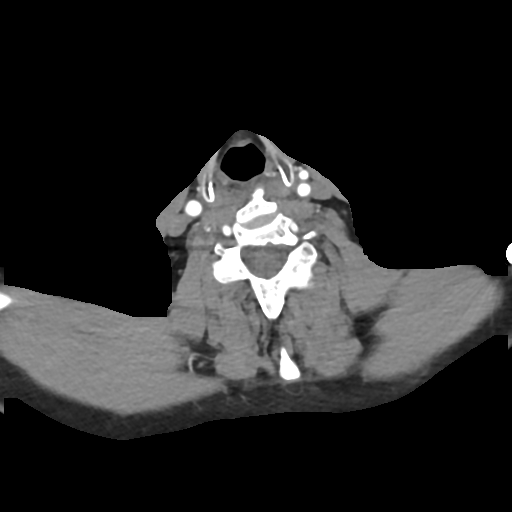
[im 119/179  soft-tissue]
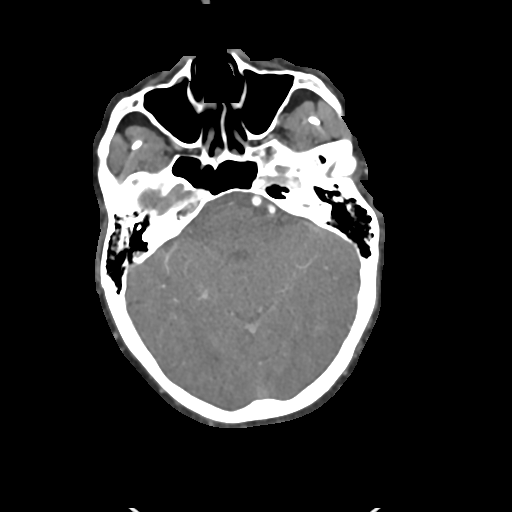

[Series 7: ax thins · axial · 0.39mm/px · z∈[+1231,+1486]mm · 6 of 358 slices shown]
[im 52/358  soft-tissue]
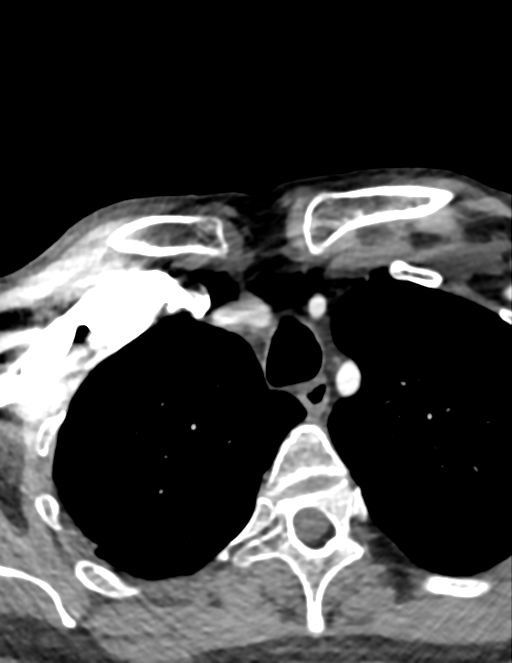
[im 103/358  bone]
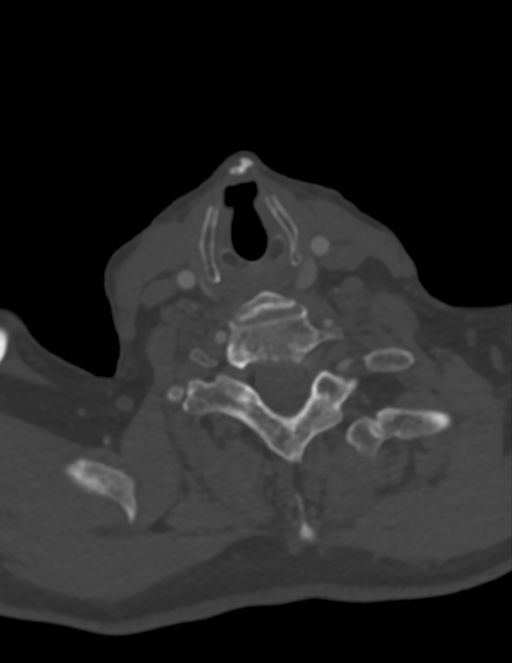
[im 154/358  soft-tissue]
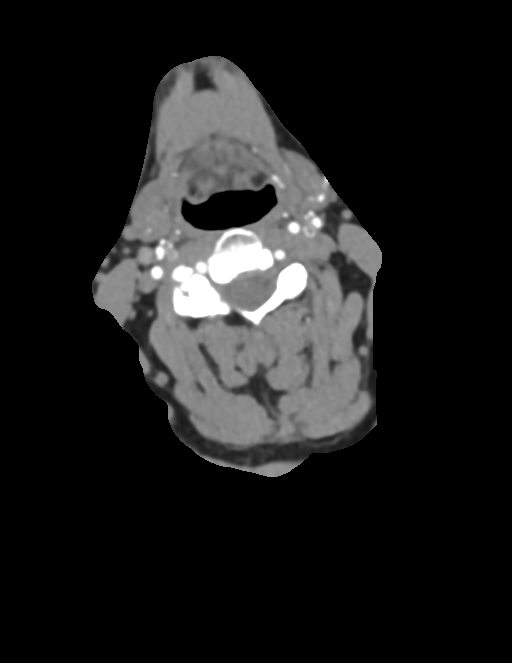
[im 205/358  bone]
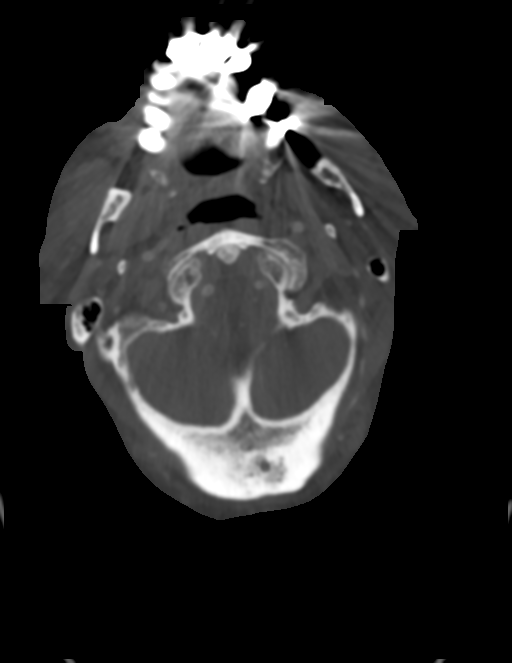
[im 256/358  soft-tissue]
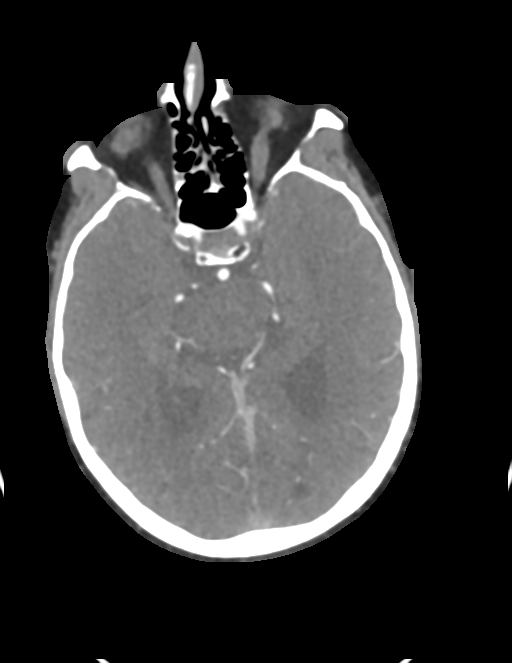
[im 307/358  bone]
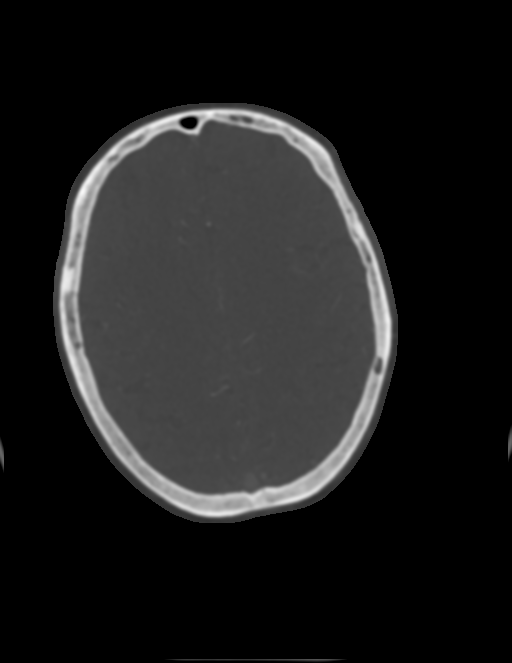

[8 of 33 positions shown; findings below may reference images not displayed]

FINDINGS: CTA NECK

Skeleton: Mild to moderate scoliosis. Right shoulder arthroplasty on
the scout view. Associated mild streak artifact. No acute osseous
abnormality identified.

Upper chest: Negative.

Other neck: Thyroid is negative for age. Negative CT appearance of
the neck elsewhere.

Aortic arch: 3 vessel arch configuration.  No arch atherosclerosis.

Right carotid system: Negative.

Left carotid system: Negative.

Vertebral arteries:
Proximal right subclavian artery and right vertebral artery origin
are normal. Dominant right vertebral artery is patent and normal to
the skull base.

Minimal proximal left subclavian plaque without stenosis. Normal
left vertebral artery origin. Mildly non dominant left vertebral
artery is patent and normal to the skull base.

CTA HEAD

Posterior circulation: Calcified bilateral V4 vertebral artery
plaque, with moderate to severe stenosis on the left (series 7,
image 145). No significant stenosis on the right. Patent
vertebrobasilar junction. Diminutive but symmetric bilateral PICA,
AICA enhancing vessels. Patent basilar artery, tortuous. Normal SCA
and PCA origins. Posterior communicating arteries are diminutive or
absent. Bilateral PCA branches appear normal aside from tortuosity.

Anterior circulation: Both ICA siphons are patent. Mild calcified
plaque bilaterally without siphon stenosis. Normal ophthalmic artery
origins. Patent carotid termini, MCA and ACA origins. Diminutive
anterior communicating artery. Bilateral ACA branches are within
normal limits. Left MCA M1 segment and bifurcation are patent
without stenosis. Mildly tortuous left MCA branches, otherwise
normal. Right MCA M1 segment and trifurcation are patent without
stenosis. Right MCA branches are within normal limits.

Venous sinuses: Early contrast timing, not well evaluated.

Anatomic variants: Mildly dominant right vertebral artery.

Review of the MIP images confirms the above findings
IMPRESSION: 1. Negative for arterial dissection or large vessel occlusion.
2. Positive for moderate to severe stenosis of the distal Left
Vertebral Artery (V4 segment) due to calcified plaque.
3. No other significant arterial stenosis in the head or neck.

## 2021-03-10 ENCOUNTER — Ambulatory Visit: Payer: Medicare Other

## 2021-03-12 ENCOUNTER — Other Ambulatory Visit: Payer: Self-pay | Admitting: Family Medicine

## 2021-03-12 DIAGNOSIS — E559 Vitamin D deficiency, unspecified: Secondary | ICD-10-CM

## 2021-03-12 DIAGNOSIS — E785 Hyperlipidemia, unspecified: Secondary | ICD-10-CM

## 2021-03-12 DIAGNOSIS — Z794 Long term (current) use of insulin: Secondary | ICD-10-CM

## 2021-03-12 DIAGNOSIS — N401 Enlarged prostate with lower urinary tract symptoms: Secondary | ICD-10-CM

## 2021-03-12 DIAGNOSIS — N138 Other obstructive and reflux uropathy: Secondary | ICD-10-CM

## 2021-03-12 DIAGNOSIS — E118 Type 2 diabetes mellitus with unspecified complications: Secondary | ICD-10-CM

## 2021-03-13 ENCOUNTER — Other Ambulatory Visit: Payer: Self-pay

## 2021-03-13 ENCOUNTER — Other Ambulatory Visit (INDEPENDENT_AMBULATORY_CARE_PROVIDER_SITE_OTHER): Payer: Medicare Other

## 2021-03-13 ENCOUNTER — Other Ambulatory Visit: Payer: Self-pay | Admitting: Cardiovascular Disease

## 2021-03-13 DIAGNOSIS — Z794 Long term (current) use of insulin: Secondary | ICD-10-CM | POA: Diagnosis not present

## 2021-03-13 DIAGNOSIS — E118 Type 2 diabetes mellitus with unspecified complications: Secondary | ICD-10-CM | POA: Diagnosis not present

## 2021-03-13 DIAGNOSIS — N138 Other obstructive and reflux uropathy: Secondary | ICD-10-CM

## 2021-03-13 DIAGNOSIS — N401 Enlarged prostate with lower urinary tract symptoms: Secondary | ICD-10-CM | POA: Diagnosis not present

## 2021-03-13 DIAGNOSIS — E559 Vitamin D deficiency, unspecified: Secondary | ICD-10-CM | POA: Diagnosis not present

## 2021-03-13 DIAGNOSIS — E785 Hyperlipidemia, unspecified: Secondary | ICD-10-CM

## 2021-03-13 LAB — COMPREHENSIVE METABOLIC PANEL
ALT: 15 U/L (ref 0–53)
AST: 13 U/L (ref 0–37)
Albumin: 4.2 g/dL (ref 3.5–5.2)
Alkaline Phosphatase: 66 U/L (ref 39–117)
BUN: 17 mg/dL (ref 6–23)
CO2: 27 mEq/L (ref 19–32)
Calcium: 9.4 mg/dL (ref 8.4–10.5)
Chloride: 106 mEq/L (ref 96–112)
Creatinine, Ser: 0.79 mg/dL (ref 0.40–1.50)
GFR: 86.5 mL/min (ref >60.00)
Glucose, Bld: 150 mg/dL — ABNORMAL HIGH (ref 70–99)
Potassium: 4.3 mEq/L (ref 3.5–5.1)
Sodium: 141 mEq/L (ref 135–145)
Total Bilirubin: 0.8 mg/dL (ref 0.2–1.2)
Total Protein: 6.3 g/dL (ref 6.0–8.3)

## 2021-03-13 LAB — LIPID PANEL
Cholesterol: 114 mg/dL (ref 0–200)
HDL: 36.8 mg/dL — ABNORMAL LOW (ref >39.00)
LDL Cholesterol: 65 mg/dL (ref 0–99)
NonHDL: 77.65
Total CHOL/HDL Ratio: 3
Triglycerides: 65 mg/dL (ref 0.0–149.0)
VLDL: 13 mg/dL (ref 0.0–40.0)

## 2021-03-13 LAB — HEMOGLOBIN A1C: Hgb A1c MFr Bld: 7.4 % — ABNORMAL HIGH (ref 4.6–6.5)

## 2021-03-13 LAB — VITAMIN D 25 HYDROXY (VIT D DEFICIENCY, FRACTURES): VITD: 33.1 ng/mL (ref 30.00–100.00)

## 2021-03-13 LAB — PSA: PSA: 1.93 ng/mL (ref 0.10–4.00)

## 2021-03-17 ENCOUNTER — Other Ambulatory Visit: Payer: Self-pay

## 2021-03-17 ENCOUNTER — Ambulatory Visit (INDEPENDENT_AMBULATORY_CARE_PROVIDER_SITE_OTHER): Payer: Medicare Other | Admitting: Family Medicine

## 2021-03-17 ENCOUNTER — Encounter: Payer: Self-pay | Admitting: Family Medicine

## 2021-03-17 VITALS — BP 136/72 | HR 80 | Temp 97.7°F | Ht 66.75 in | Wt 161.5 lb

## 2021-03-17 DIAGNOSIS — Z7189 Other specified counseling: Secondary | ICD-10-CM

## 2021-03-17 DIAGNOSIS — K5900 Constipation, unspecified: Secondary | ICD-10-CM

## 2021-03-17 DIAGNOSIS — M85852 Other specified disorders of bone density and structure, left thigh: Secondary | ICD-10-CM

## 2021-03-17 DIAGNOSIS — I10 Essential (primary) hypertension: Secondary | ICD-10-CM

## 2021-03-17 DIAGNOSIS — E1169 Type 2 diabetes mellitus with other specified complication: Secondary | ICD-10-CM

## 2021-03-17 DIAGNOSIS — E118 Type 2 diabetes mellitus with unspecified complications: Secondary | ICD-10-CM

## 2021-03-17 DIAGNOSIS — I2510 Atherosclerotic heart disease of native coronary artery without angina pectoris: Secondary | ICD-10-CM

## 2021-03-17 DIAGNOSIS — Z0000 Encounter for general adult medical examination without abnormal findings: Secondary | ICD-10-CM

## 2021-03-17 DIAGNOSIS — E559 Vitamin D deficiency, unspecified: Secondary | ICD-10-CM

## 2021-03-17 DIAGNOSIS — Z8673 Personal history of transient ischemic attack (TIA), and cerebral infarction without residual deficits: Secondary | ICD-10-CM

## 2021-03-17 DIAGNOSIS — I6502 Occlusion and stenosis of left vertebral artery: Secondary | ICD-10-CM

## 2021-03-17 MED ORDER — OZEMPIC (0.25 OR 0.5 MG/DOSE) 2 MG/1.5ML ~~LOC~~ SOPN: PEN_INJECTOR | SUBCUTANEOUS | 11 refills | Status: DC

## 2021-03-17 NOTE — Progress Notes (Signed)
Patient ID: Norman Marks, male    DOB: 12-11-1945, 75 y.o.   MRN: [id number]  This visit was conducted in person.  BP 136/72   Pulse 80   Temp 97.7 F (36.5 C) (Temporal)   Ht 5' 6.75" (1.695 m)   Wt 161 lb 8 oz (73.3 kg)   SpO2 98%   BMI 25.48 kg/m    CC: AMW Subjective:   HPI: Norman Marks is a 75 y.o. male presenting on 03/17/2021 for Medicare Wellness  Did not see health advisor this year.   Hearing Screening   '500Hz'$  '1000Hz'$  '2000Hz'$  '4000Hz'$   Right ear 40 40 0 0  Left ear 0 0 0 0  Comments: Wears bilateral hearing aids.  Not wearing at today's OV.   Pt heard practice tone in L ear.   Vision Screening - Comments:: Last eye exam, 04/2020.  [place] Office Visit from 03/17/2021 in [place] [facility] [place]  PHQ-2 Total Score 0       Fall Risk  03/17/2021 03/04/2020 03/03/2019 10/07/2017 10/03/2016  Falls in the past year? 0 0 0 No No  Comment - - - - -  Number falls in past yr: - 0 - - -  Injury with Fall? - 0 - - -  Risk for fall due to : - Medication side effect - - -  Follow up - Falls evaluation completed;Falls prevention discussed - - -   DM - on lantus 42u daily and janumet XR 100/'1000mg'$  daily. we previously discussed transition to [facility] - he is interested in trying this.   Incidentally found stroke 06/2020 - on aspirin, statin. Late last year found to have mod-severe stenosis of distal L vertebral artery s/p VVS eval - thought chronic, shouldn't cause HA or dizziness. Dizziness has since resolved    Preventative: COLONOSCOPY 12/2017 - 4 polyps - SSP, TA, diverticulosis, rpt 3 yrs ([place]) Prostate cancer screening - some nocturia,.  Lung cancer screening - not eligible, quit >15 yrs ago DEXA 07/2013 osteopenia T -2.2 femur, -1.2 spine  DEXA 04/2017 osteopenia T score -2.1 hip, -1.9 spine Flu yearly COVID vaccine - Moderna 09/2019, 10/2019, booster 05/2020  Penumovax 06/28/2010. Prevnar 09/2013 Tdap 2021 Zostavax - 01/2013  shingrix - discussed,  rec check with pharmacy  Advanced directive: doesn't have one set up. Wife would be HCPOA. Does not want drastic measures. Has paperwork at home. Asked to bring [place] copy when completed.  Seat belt use discussed Sunscreen use discussed. No changing moles on skin. Sees derm yearly.  Sleep - averages 7-8 hours. Some morning stiffness hen he awakens.  Ex smoker - quit 1994  Alcohol - none  Dentist q6 mo  Eye exam yearly  Bowels - some ongoing constipation managed with stool softeners  Bladder - no incontinence   Caffeine: 1 cup coffee/day   Lives with wife, 1 dog, 5 cats.   Occupation: retired   Edu: 12th grade   Activity: no regular exercise.  Diet: good water, fruits/vegetables daily      Relevant past medical, surgical, family and social history reviewed and updated as indicated. Interim medical history since our last visit reviewed. Allergies and medications reviewed and updated. Outpatient Medications Prior to Visit  Medication Sig Dispense Refill   aspirin 81 MG tablet Take 81 mg by mouth daily.     carvedilol (COREG) 25 MG tablet Take 1 tablet (25 mg total) by mouth 2 (two) times daily with a meal. Need to schedule yearly  appt with Dr. Fletcher Anon for future refills 60 tablet 3   Cholecalciferol (VITAMIN D3) 25 MCG (1000 UT) CAPS Take 1 capsule (1,000 Units total) by mouth daily. 30 capsule    insulin glargine (LANTUS) 100 UNIT/ML injection INJECT 0.45 ML (45 UNITS TOTAL) INTO THE SKIN AT BEDTIME 10 mL 3   Insulin Pen Needle (EXEL PEN NEEDLES 31GX1/4") 31G X 6 MM MISC Use as directed 100 each 3   JANUMET XR [phone] MG TB24 TAKE 1 TABLET BY MOUTH ONCE DAILY 90 tablet 1   lovastatin (MEVACOR) 40 MG tablet TAKE ONE TABLET BY MOUTH EVERY NIGHT AT BEDTIME 90 tablet 0   meclizine (ANTIVERT) 25 MG tablet Take 1 tablet (25 mg total) by mouth 2 (two) times daily as needed for up to 20 doses for dizziness or nausea. 20 tablet 0   ondansetron (ZOFRAN) 4 MG tablet Take 1 tablet (4 mg total) by mouth  every 8 (eight) hours as needed for up to 20 doses for nausea or vomiting. 20 tablet 0   ONETOUCH ULTRA test strip CHECK BLOOD SUGAR ONCE OR TWICE DAILY ASDIRECTED 100 each 3   ramipril (ALTACE) 10 MG capsule TAKE 1 CAPSULE BY MOUTH ONCE DAILY 90 capsule 1   TRUEPLUS INSULIN SYRINGE 31G X 5/16" 1 ML MISC USE TO INJECT INSULIN AS DIRECTED 100 each 3   No facility-administered medications prior to visit.     Per HPI unless specifically indicated in ROS section below Review of Systems  Constitutional:  Positive for fatigue. Negative for activity change, appetite change, chills, fever and unexpected weight change.  HENT:  Negative for hearing loss.   Eyes:  Negative for visual disturbance.  Respiratory:  Positive for cough (mild) and shortness of breath (exertional, some). Negative for chest tightness and wheezing.   Cardiovascular:  Positive for leg swelling (mild). Negative for chest pain and palpitations.  Gastrointestinal:  Positive for constipation. Negative for abdominal distention, abdominal pain, blood in stool, diarrhea, nausea and vomiting.  Genitourinary:  Negative for difficulty urinating and hematuria.  Musculoskeletal:  Negative for arthralgias, myalgias and neck pain.  Skin:  Negative for rash.  Neurological:  Negative for dizziness, seizures, syncope and headaches.  Hematological:  Negative for adenopathy. Does not bruise/bleed easily.  Psychiatric/Behavioral:  Negative for dysphoric mood. The patient is not nervous/anxious.    Objective:  BP 136/72   Pulse 80   Temp 97.7 F (36.5 C) (Temporal)   Ht 5' 6.75" (1.695 m)   Wt 161 lb 8 oz (73.3 kg)   SpO2 98%   BMI 25.48 kg/m   Wt Readings from Last 3 Encounters:  03/17/21 161 lb 8 oz (73.3 kg)  09/09/20 165 lb 9 oz (75.1 kg)  07/05/20 161 lb 3.2 oz (73.1 kg)      Physical Exam Vitals and nursing note reviewed.  Constitutional:      General: He is not in acute distress.    Appearance: Normal appearance. He is  well-developed. He is not ill-appearing.  HENT:     Head: Normocephalic and atraumatic.     Right Ear: Hearing, tympanic membrane, ear canal and external ear normal.     Left Ear: Hearing, tympanic membrane, ear canal and external ear normal.  Eyes:     General: No scleral icterus.    Extraocular Movements: Extraocular movements intact.     Conjunctiva/sclera: Conjunctivae normal.     Pupils: Pupils are equal, round, and reactive to light.  Neck:     Thyroid: No  thyroid mass or thyromegaly.     Vascular: No carotid bruit.  Cardiovascular:     Rate and Rhythm: Normal rate and regular rhythm.     Pulses: Normal pulses.          Radial pulses are 2+ on the right side and 2+ on the left side.     Heart sounds: Normal heart sounds. No murmur heard. Pulmonary:     Effort: Pulmonary effort is normal. No respiratory distress.     Breath sounds: Normal breath sounds. No wheezing, rhonchi or rales.  Abdominal:     General: Bowel sounds are normal. There is no distension.     Palpations: Abdomen is soft. There is no mass.     Tenderness: There is no abdominal tenderness. There is no guarding or rebound.     Hernia: No hernia is present.  Musculoskeletal:        General: Normal range of motion.     Cervical back: Normal range of motion and neck supple.     Right lower leg: No edema.     Left lower leg: No edema.  Lymphadenopathy:     Cervical: No cervical adenopathy.  Skin:    General: Skin is warm and dry.     Findings: No rash.  Neurological:     General: No focal deficit present.     Mental Status: He is alert and oriented to person, place, and time.     Comments:  Recall 3/3  Calculation 5/5 DLROW  Psychiatric:        Mood and Affect: Mood normal.        Behavior: Behavior normal.        Thought Content: Thought content normal.        Judgment: Judgment normal.      Results for orders placed or performed in visit on 03/13/21  VITAMIN D 25 Hydroxy (Vit-D Deficiency,  Fractures)  Result Value Ref Range   VITD 33.10 30.00 - 100.00 ng/mL  PSA  Result Value Ref Range   PSA 1.93 0.10 - 4.00 ng/mL  Hemoglobin A1c  Result Value Ref Range   Hgb A1c MFr Bld 7.4 (H) 4.6 - 6.5 %  Comprehensive metabolic panel  Result Value Ref Range   Sodium 141 135 - 145 mEq/L   Potassium 4.3 3.5 - 5.1 mEq/L   Chloride 106 96 - 112 mEq/L   CO2 27 19 - 32 mEq/L   Glucose, Bld 150 (H) 70 - 99 mg/dL   BUN 17 6 - 23 mg/dL   Creatinine, Ser 0.79 0.40 - 1.50 mg/dL   Total Bilirubin 0.8 0.2 - 1.2 mg/dL   Alkaline Phosphatase 66 39 - 117 U/L   AST 13 0 - 37 U/L   ALT 15 0 - 53 U/L   Total Protein 6.3 6.0 - 8.3 g/dL   Albumin 4.2 3.5 - 5.2 g/dL   GFR 86.50 >60.00 mL/min   Calcium 9.4 8.4 - 10.5 mg/dL  Lipid panel  Result Value Ref Range   Cholesterol 114 0 - 200 mg/dL   Triglycerides 65.0 0.0 - 149.0 mg/dL   HDL 36.80 (L) >39.00 mg/dL   VLDL 13.0 0.0 - 40.0 mg/dL   LDL Cholesterol 65 0 - 99 mg/dL   Total CHOL/HDL Ratio 3    NonHDL 77.65     Assessment & Plan:  This visit occurred during the SARS-CoV-2 public health emergency.  Safety protocols were in place, including screening questions prior to the visit, additional  usage of staff PPE, and extensive cleaning of exam room while observing appropriate contact time as indicated for disinfecting solutions.   Problem List Items Addressed This Visit     Medicare annual wellness visit, subsequent - Primary (Chronic)    I have personally reviewed the Medicare Annual Wellness questionnaire and have noted 1. The patient's medical and social history 2. Their use of alcohol, tobacco or illicit drugs 3. Their current medications and supplements 4. The patient's functional ability including ADL's, fall risks, home safety risks and hearing or visual impairment. Cognitive function has been assessed and addressed as indicated.  5. Diet and physical activity 6. Evidence for depression or mood disorders The patients weight, height,  BMI have been recorded in the chart. I have made referrals, counseling and provided education to the patient based on review of the above and I have provided the pt with a written personalized care plan for preventive services. Provider list updated.. See scanned questionairre as needed for further documentation. Reviewed preventative protocols and updated unless pt declined.       Advanced care planning/counseling discussion (Chronic)    Advanced directive: doesn't have one set up. Wife would be HCPOA. Does not want drastic measures. Has paperwork at home. Asked to bring [place] copy when completed.       Health maintenance examination (Chronic)    Preventative protocols reviewed and updated unless pt declined. Discussed healthy diet and lifestyle.       Diabetes mellitus type 2, controlled, with complications ([place])    Chronic, stable however A1c remaining slightly elevated - recommend he price out ozempic which I sent to pharmacy. If started, discussed transition from janumet XR to plain metformin XR. He will let me know if decides to start [place]. Reviewed mechanism of action as well as side effects of medication.       Relevant Medications   Semaglutide,0.25 or 0.'5MG'$ /DOS, (OZEMPIC, 0.25 OR 0.5 MG/DOSE,) 2 MG/1.5ML SOPN   HTN (hypertension)    Chronic, stable on current regimen - continue carvedilol and ramipril      Hyperlipidemia associated with type 2 diabetes mellitus ([place])    Chronic, stable on lovastatin with LDL 65 The ASCVD Risk score Mikey Bussing DC Jr., et al., 2013) failed to calculate for the following reasons:   The valid total cholesterol range is 130 to 320 mg/dL       Relevant Medications   Semaglutide,0.25 or 0.'5MG'$ /DOS, (OZEMPIC, 0.25 OR 0.5 MG/DOSE,) 2 MG/1.5ML SOPN   CAD (coronary artery disease)    Continue aspirin, statin.       Osteopenia    Update DEXA next year. Continue vitamin D 1000 IU daily.       Vitamin D deficiency    Continue daily vit D replacement.        Vertebral artery stenosis    Stable period s/p VVS eval, continue aspirin, statin      History of CVA in adulthood   Constipation    Discussed fiber in diet, continue stool softener ,update if ongoing to consider further treatme nt        Meds ordered this encounter  Medications   Semaglutide,0.25 or 0.'5MG'$ /DOS, (OZEMPIC, 0.25 OR 0.5 MG/DOSE,) 2 MG/1.5ML SOPN    Sig: Inject 0.25 mg into the skin once a week for 14 days, THEN 0.5 mg once a week.    Dispense:  1.5 mL    Refill:  11    Price out, to replace janumet XR   No orders of  the defined types were placed in this encounter.    Patient instructions: You may call [facility] at [phone] to ask about scheduling colonoscopy.  If interested, check with pharmacy about new 2 shot shingles series (shingrix).  Increase fiber in diet to help with constipation, ok to continue stool softeners.  Work on advanced directive and bring [place] copy when done.  Price out ozempic weekly injection take 0.'25mg'$  weekly for 2 weeks and if tolerated well, increase to 0.'5mg'$  weekly. This would replace one of the components of Janumet XR. Let me know you if you start this to change janumet XR to plain metformin XR Return as needed or in 6 months for follow up visit   Follow up plan: Return in about 6 months (around 09/14/2021) for follow up visit.  Ria Bush, MD

## 2021-03-17 NOTE — Patient Instructions (Addendum)
You may call [facility] at [phone] to ask about scheduling colonoscopy.  If interested, check with pharmacy about new 2 shot shingles series (shingrix).  Increase fiber in diet to help with constipation, ok to continue stool softeners.  Work on advanced directive and bring [place] copy when done.  Price out ozempic weekly injection take 0.'25mg'$  weekly for 2 weeks and if tolerated well, increase to 0.'5mg'$  weekly. This would replace one of the components of Janumet XR. Let me know you if you start this to change janumet XR to plain metformin XR Return as needed or in 6 months for follow up visit   Health Maintenance After Age 73 After age 1, you are at a higher risk for certain long-term diseases and infections as well as injuries from falls. Falls are a major cause of broken bones and head injuries in people who are older than age 87. Getting regular preventive care can help to keep you healthy and well. Preventive care includes getting regular testing and making lifestyle changes as recommended by your health care provider. Talk with your health care provider about: Which screenings and tests you should have. A screening is a test that checks for a disease when you have no symptoms. A diet and exercise plan that is right for you. What should I know about screenings and tests to prevent falls? Screening and testing are the best ways to find a health problem early. Early diagnosis and treatment give you the best chance of managing medical conditions that are common after age 80. Certain conditions and lifestyle choices may make you more likely to have a fall. Your health care provider may recommend: Regular vision checks. Poor vision and conditions such as cataracts can make you more likely to have a fall. If you wear glasses, make sure to get your prescription updated if your vision changes. Medicine review. Work with your health care provider to regularly review all of the medicines you are taking,  including over-the-counter medicines. Ask your health care provider about any side effects that may make you more likely to have a fall. Tell your health care provider if any medicines that you take make you feel dizzy or sleepy. Osteoporosis screening. Osteoporosis is a condition that causes the bones to get weaker. This can make the bones weak and cause them to break more easily. Blood pressure screening. Blood pressure changes and medicines to control blood pressure can make you feel dizzy. Strength and balance checks. Your health care provider may recommend certain tests to check your strength and balance while standing, walking, or changing positions. Foot health exam. Foot pain and numbness, as well as not wearing proper footwear, can make you more likely to have a fall. Depression screening. You may be more likely to have a fall if you have a fear of falling, feel emotionally low, or feel unable to do activities that you used to do. Alcohol use screening. Using too much alcohol can affect your balance and may make you more likely to have a fall. What actions can I take to lower my risk of falls? General instructions Talk with your health care provider about your risks for falling. Tell your health care provider if: You fall. Be sure to tell your health care provider about all falls, even ones that seem minor. You feel dizzy, sleepy, or off-balance. Take over-the-counter and prescription medicines only as told by your health care provider. These include any supplements. Eat a healthy diet and maintain a healthy  weight. A healthy diet includes low-fat dairy products, low-fat (lean) meats, and fiber from whole grains, beans, and lots of fruits and vegetables. Home safety Remove any tripping hazards, such as rugs, cords, and clutter. Install safety equipment such as grab bars in bathrooms and safety rails on stairs. Keep rooms and walkways well-lit. Activity  Follow a regular exercise program  to stay fit. This will help you maintain your balance. Ask your health care provider what types of exercise are appropriate for you. If you need a cane or walker, use it as recommended by your health care provider. Wear supportive shoes that have nonskid soles. Lifestyle Do not drink alcohol if your health care provider tells you not to drink. If you drink alcohol, limit how much you have: 0-1 drink a day for women. 0-2 drinks a day for men. Be aware of how much alcohol is in your drink. In the [place] one drink equals one typical bottle of beer (12 oz), one-half glass of wine (5 oz), or one shot of hard liquor (1 oz). Do not use any products that contain nicotine or tobacco, such as cigarettes and e-cigarettes. If you need help quitting, ask your health care provider. Summary Having a healthy lifestyle and getting preventive care can help to protect your health and wellness after age 45. Screening and testing are the best way to find a health problem early and help you avoid having a fall. Early diagnosis and treatment give you the best chance for managing medical conditions that are more common for people who are older than age 58. Falls are a major cause of broken bones and head injuries in people who are older than age 67. Take precautions to prevent a fall at home. Work with your health care provider to learn what changes you can make to improve your health and wellness and to prevent falls. This information is not intended to replace advice given to you by your health care provider. Make sure you discuss any questions you have with your health care provider. Document Revised: 09/09/2020 Document Reviewed: 06/17/2020 Elsevier Patient Education  2022 [employer].

## 2021-03-18 DIAGNOSIS — K5900 Constipation, unspecified: Secondary | ICD-10-CM | POA: Insufficient documentation

## 2021-03-18 NOTE — Assessment & Plan Note (Signed)
Continue aspirin, statin.  

## 2021-03-18 NOTE — Assessment & Plan Note (Signed)
Chronic, stable on current regimen - continue carvedilol and ramipril

## 2021-03-18 NOTE — Assessment & Plan Note (Signed)
Stable period s/p VVS eval, continue aspirin, statin

## 2021-03-18 NOTE — Assessment & Plan Note (Signed)

## 2021-03-18 NOTE — Assessment & Plan Note (Signed)
Continue daily vit D replacement.

## 2021-03-18 NOTE — Assessment & Plan Note (Signed)
Chronic, stable on lovastatin with LDL 65 The ASCVD Risk score Norman Bussing DC Jr., Norman al., Norman Marks) failed to calculate for the following reasons:   The valid total cholesterol range is 130 to 320 mg/dL

## 2021-03-18 NOTE — Assessment & Plan Note (Signed)
Preventative protocols reviewed and updated unless pt declined. Discussed healthy diet and lifestyle.  

## 2021-03-18 NOTE — Assessment & Plan Note (Signed)
Discussed fiber in diet, continue stool softener ,update if ongoing to consider further treatme nt

## 2021-03-18 NOTE — Assessment & Plan Note (Signed)
Advanced directive: doesn't have one set up. Wife would be HCPOA. Does not want drastic measures. Has paperwork at home. Asked to bring [place] copy when completed.

## 2021-03-18 NOTE — Assessment & Plan Note (Signed)
Update DEXA next year. Continue vitamin D 1000 IU daily.

## 2021-03-18 NOTE — Assessment & Plan Note (Signed)
Chronic, stable however A1c remaining slightly elevated - recommend he price out ozempic which I sent to pharmacy. If started, discussed transition from janumet XR to plain metformin XR. He will let me know if decides to start [place]. Reviewed mechanism of action as well as side effects of medication.

## 2021-03-21 ENCOUNTER — Encounter: Payer: Self-pay | Admitting: Internal Medicine

## 2021-03-22 ENCOUNTER — Ambulatory Visit (INDEPENDENT_AMBULATORY_CARE_PROVIDER_SITE_OTHER): Payer: Medicare Other | Admitting: Dermatology

## 2021-03-22 ENCOUNTER — Encounter: Payer: Self-pay | Admitting: Dermatology

## 2021-03-22 ENCOUNTER — Other Ambulatory Visit: Payer: Self-pay

## 2021-03-22 ENCOUNTER — Telehealth: Payer: Self-pay | Admitting: Family Medicine

## 2021-03-22 DIAGNOSIS — D0439 Carcinoma in situ of skin of other parts of face: Secondary | ICD-10-CM | POA: Diagnosis not present

## 2021-03-22 DIAGNOSIS — C44519 Basal cell carcinoma of skin of other part of trunk: Secondary | ICD-10-CM

## 2021-03-22 DIAGNOSIS — C4491 Basal cell carcinoma of skin, unspecified: Secondary | ICD-10-CM

## 2021-03-22 DIAGNOSIS — L570 Actinic keratosis: Secondary | ICD-10-CM

## 2021-03-22 DIAGNOSIS — C060 Malignant neoplasm of cheek mucosa: Secondary | ICD-10-CM

## 2021-03-22 DIAGNOSIS — L820 Inflamed seborrheic keratosis: Secondary | ICD-10-CM | POA: Diagnosis not present

## 2021-03-22 DIAGNOSIS — Z85828 Personal history of other malignant neoplasm of skin: Secondary | ICD-10-CM | POA: Diagnosis not present

## 2021-03-22 DIAGNOSIS — D045 Carcinoma in situ of skin of trunk: Secondary | ICD-10-CM | POA: Diagnosis not present

## 2021-03-22 DIAGNOSIS — D485 Neoplasm of uncertain behavior of skin: Secondary | ICD-10-CM

## 2021-03-22 HISTORY — DX: Basal cell carcinoma of skin, unspecified: C44.91

## 2021-03-22 HISTORY — DX: Malignant neoplasm of cheek mucosa: C06.0

## 2021-03-22 MED ORDER — NOVOFINE PLUS PEN NEEDLE 32G X 4 MM MISC: 0 refills | Status: DC

## 2021-03-22 MED ORDER — IMIQUIMOD 5 % EX CREA
TOPICAL_CREAM | Freq: Every day | CUTANEOUS | 0 refills | Status: DC
Start: 2021-03-22 — End: 2023-02-14

## 2021-03-22 NOTE — Patient Instructions (Signed)

## 2021-03-22 NOTE — Telephone Encounter (Signed)
Spoke with Caryl Pina of [facility] about the metformin XR.  States pt decided to start Ozempic and will change to metformin XR.  FYI to Dr. Darnell Level.   E-scribed pen needles.

## 2021-03-22 NOTE — Telephone Encounter (Signed)
[facility] called they wanted to know if pt needed to start taking metformin XR, he showed them his AVS. Also he needs pin needles

## 2021-03-23 MED ORDER — METFORMIN HCL ER 500 MG PO TB24: 1000.0000 mg | ORAL_TABLET | Freq: Every day | ORAL | 3 refills | Status: DC

## 2021-03-23 NOTE — Telephone Encounter (Addendum)
Noted. Will send in Metformin XR alone to take with his ozempic and lantus

## 2021-03-23 NOTE — Addendum Note (Signed)
Addended by: Ria Bush on: 03/23/2021 10:22 AM   Modules accepted: Orders

## 2021-03-24 ENCOUNTER — Encounter: Payer: Self-pay | Admitting: *Deleted

## 2021-03-27 ENCOUNTER — Telehealth: Payer: Self-pay

## 2021-03-27 NOTE — Telephone Encounter (Signed)
-----   Message from Lavonna Monarch, MD sent at 03/25/2021  6:37 AM EDT ----- May already be scheduled for surgery but may need two surgical appointments for 3 nonmelanoma skin cancers.

## 2021-03-27 NOTE — Telephone Encounter (Signed)
Path to patient x2 surgical visits have been made

## 2021-03-30 ENCOUNTER — Encounter: Payer: Self-pay | Admitting: Dermatology

## 2021-03-30 NOTE — Progress Notes (Signed)
Follow-Up Visit   Subjective  Norman Marks is a 75 y.o. male who presents for the following: Follow-up (Crusty lesions on the hands and new area on the back ).  Multiple new crusts and gentleman with a history of dozens of nonmelanoma skin cancers. Location:  Duration:  Quality:  Associated Signs/Symptoms: Modifying Factors:  Severity:  Timing: Context:   Objective  Well appearing patient in no apparent distress; mood and affect are within normal limits. Left cheek 6 mm waxy pink crust, rule out superficial carcinoma     Left Upper Back 8 mm pink crust, superficial carcinoma     right cheek Pearly 7 mm crust, possible carcinoma     Left chest Pearly pink 10m crust at top of white scar, rule out carcinoma possibly recurrent     total body (15) I marked roughly a dozen pink waxy crusts on patient's torso which represent a mixture of small carcinomas and larger actinic keratoses.  His wife is familiar with using topical medications and will apply the Aldara 3-5 times weekly for a total of [id number] or until there is brisk inflammation.  Wife to treat with topical aldara        All skin waist up examined.   Assessment & Plan    Neoplasm of uncertain behavior of skin (4) Left cheek  Skin / nail biopsy Type of biopsy: tangential   Informed consent: discussed and consent obtained   Timeout: patient name, date of birth, surgical site, and procedure verified   Anesthesia: the lesion was anesthetized in a standard fashion   Anesthetic:  1% lidocaine w/ epinephrine 1-100,000 local infiltration Instrument used: flexible razor blade   Hemostasis achieved with: aluminum chloride and electrodesiccation   Outcome: patient tolerated procedure well   Post-procedure details: wound care instructions given    Specimen 1 - Surgical pathology Differential Diagnosis: bcc scc  Check Margins: No  Left Upper Back  Skin / nail biopsy Type of biopsy:  tangential   Informed consent: discussed and consent obtained   Timeout: patient name, date of birth, surgical site, and procedure verified   Procedure prep:  Patient was prepped and draped in usual sterile fashion (Non sterile) Prep type:  Chlorhexidine Anesthesia: the lesion was anesthetized in a standard fashion   Anesthetic:  1% lidocaine w/ epinephrine 1-100,000 local infiltration Instrument used: flexible razor blade   Outcome: patient tolerated procedure well   Post-procedure details: wound care instructions given    Destruction of lesion Complexity: simple   Destruction method: electrodesiccation and curettage   Informed consent: discussed and consent obtained   Timeout:  patient name, date of birth, surgical site, and procedure verified Anesthesia: the lesion was anesthetized in a standard fashion   Anesthetic:  1% lidocaine w/ epinephrine 1-100,000 local infiltration Curettage performed in three different directions: Yes   Curettage cycles:  3 Margin per side (cm):  0.1 Final wound size (cm):  1.3 Hemostasis achieved with:  aluminum chloride Outcome: patient tolerated procedure well with no complications   Post-procedure details: wound care instructions given    Specimen 2 - Surgical pathology Differential Diagnosis: bcc scc tx with bx  Check Margins: No  right cheek  Skin / nail biopsy Type of biopsy: tangential   Informed consent: discussed and consent obtained   Timeout: patient name, date of birth, surgical site, and procedure verified   Anesthesia: the lesion was anesthetized in a standard fashion   Anesthetic:  1% lidocaine w/ epinephrine 1-100,000 local  infiltration Instrument used: flexible razor blade   Hemostasis achieved with: aluminum chloride and electrodesiccation   Outcome: patient tolerated procedure well   Post-procedure details: wound care instructions given    Specimen 3 - Surgical pathology Differential Diagnosis: bcc scc  Check Margins:  No  Left chest  Skin / nail biopsy Type of biopsy: tangential   Informed consent: discussed and consent obtained   Timeout: patient name, date of birth, surgical site, and procedure verified   Anesthesia: the lesion was anesthetized in a standard fashion   Anesthetic:  1% lidocaine w/ epinephrine 1-100,000 local infiltration Instrument used: flexible razor blade   Hemostasis achieved with: aluminum chloride and electrodesiccation   Outcome: patient tolerated procedure well   Post-procedure details: wound care instructions given    Specimen 4 - Surgical pathology Differential Diagnosis: bcc scc  Check Margins: No  AK (actinic keratosis) (15) total body  Destruction of lesion - total body Complexity: simple   Destruction method: cryotherapy   Informed consent: discussed and consent obtained   Timeout:  patient name, date of birth, surgical site, and procedure verified Lesion destroyed using liquid nitrogen: Yes   Cryotherapy cycles:  3 Outcome: patient tolerated procedure well with no complications   Post-procedure details: wound care instructions given   Additional details:  Has both diffuse actinic keratoses and many hypertrophic lesions.  Those on the torso will be treated with Aldara, 15+ lesions on the head and neck were treated with 5-second LN2 freeze.  imiquimod (ALDARA) 5 % cream - total body Apply topically at bedtime.      I, Lavonna Monarch, MD, have reviewed all documentation for this visit.  The documentation on 03/30/21 for the exam, diagnosis, procedures, and orders are all accurate and complete.

## 2021-04-13 ENCOUNTER — Other Ambulatory Visit: Payer: Self-pay | Admitting: Family Medicine

## 2021-05-09 ENCOUNTER — Other Ambulatory Visit: Payer: Self-pay | Admitting: Family Medicine

## 2021-05-11 ENCOUNTER — Encounter: Payer: Self-pay | Admitting: Dermatology

## 2021-05-11 ENCOUNTER — Other Ambulatory Visit: Payer: Self-pay

## 2021-05-11 ENCOUNTER — Ambulatory Visit (INDEPENDENT_AMBULATORY_CARE_PROVIDER_SITE_OTHER): Payer: Medicare Other | Admitting: Dermatology

## 2021-05-11 DIAGNOSIS — D485 Neoplasm of uncertain behavior of skin: Secondary | ICD-10-CM

## 2021-05-11 DIAGNOSIS — C44329 Squamous cell carcinoma of skin of other parts of face: Secondary | ICD-10-CM | POA: Diagnosis not present

## 2021-05-11 DIAGNOSIS — C44622 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, including shoulder: Secondary | ICD-10-CM | POA: Diagnosis not present

## 2021-05-11 DIAGNOSIS — C44320 Squamous cell carcinoma of skin of unspecified parts of face: Secondary | ICD-10-CM

## 2021-05-11 DIAGNOSIS — C44519 Basal cell carcinoma of skin of other part of trunk: Secondary | ICD-10-CM | POA: Diagnosis not present

## 2021-05-11 NOTE — Patient Instructions (Signed)

## 2021-05-25 ENCOUNTER — Ambulatory Visit (INDEPENDENT_AMBULATORY_CARE_PROVIDER_SITE_OTHER): Payer: Medicare Other | Admitting: Internal Medicine

## 2021-05-25 ENCOUNTER — Encounter: Payer: Self-pay | Admitting: Internal Medicine

## 2021-05-25 VITALS — BP 130/72 | HR 88 | Ht 69.0 in | Wt 162.0 lb

## 2021-05-25 DIAGNOSIS — Z86010 Personal history of colonic polyps: Secondary | ICD-10-CM | POA: Diagnosis not present

## 2021-05-25 DIAGNOSIS — K5900 Constipation, unspecified: Secondary | ICD-10-CM

## 2021-05-25 MED ORDER — SUTAB 1479-225-188 MG PO TABS: ORAL_TABLET | ORAL | 0 refills | Status: DC

## 2021-05-25 NOTE — Progress Notes (Signed)
Patient ID: Norman Marks, male   DOB: Aug 11, 1945, 74 y.o.   MRN: [id number] HPI: Norman Marks is a 75 year old male with a history of sessile serrated and adenomatous colon polyps, hypertension, hyperlipidemia, diabetes, osteopenia who is seen to discuss surveillance colonoscopy and constipation.  He is here with his wife.  He is known to me from his last surveillance colonoscopy performed on 12/24/2017.  This revealed 4 polyps all less than a centimeter.  These were removed.  There were scattered left-sided diverticulosis.  These polyps were found to be SSP x3 and tubular adenoma x1.  He reports overall he has been feeling well.  No recent chest pain or shortness of breath.  He has struggled with harder stools and less frequent stool.  Stools are harder to pass and on average occur about every other day.  This is not associated with abdominal pain.  On advice from primary care he tried increased fiber but has not seen increased stool frequency.  No rectal bleeding or melena.  He denies heartburn, pyrosis, dysphagia and odynophagia.  Appetite has been good.  No frequent nausea or vomiting.  Past Medical History:  Diagnosis Date   Basal cell carcinoma 02/10/2020   SEE MEDIA 8 PAGES OF PREVIOUS BCC   BCC (basal cell carcinoma) 03/22/2021   sup- left chst (CX35FU)   BPH (benign prostatic hypertrophy)    CAD (coronary artery disease) 2009   [name] (cardiolite 2009 with 70% stenosis of proximal ramus, not amenable to PCI, rec treat medically) --> [name]   Cancer ([place])    basal cell skin cancer   DDD (degenerative disc disease), lumbar    s/p surgery   Diabetes type 2, uncontrolled 1990s   referred to [facility] 09/2015   Diverticulosis    Headache(784.0)    History of adenomatous polyp of colon 10/2012   History of nephrolithiasis 2012   L ureter, R renal pelvis   HLD (hyperlipidemia)    HTN (hypertension)    mild   Hx of migraines    Left bundle branch block    Osteoarthritis of CMC joint of  thumb 2014   s/p injections ([place])   Osteoarthritis of right shoulder 2015   s/p injections ([place])   Osteopenia 06/2011   T score -3, completed 2 yrs forteo, T score improved to -2.2 (2015)   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   in situ (CX35FU)   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   SEE MEDIA 8 PAGES OF PREVIOUS SCC AND TREATMENTS   Vitamin D deficiency    Wears hearing aid in both ears     Past Surgical History:  Procedure Laterality Date   [place], 2008   lumbar; x 3, spinal fusion   CARDIAC CATHETERIZATION  2009   70% stenosis ostium   CARDIOVASCULAR STRESS TEST  2011   fixed defect inferior/septal walls without reversibility, LVEF 43% post stress   CATARACT EXTRACTION W/PHACO Right 03/09/2019   Procedure: CATARACT EXTRACTION PHACO AND INTRAOCULAR LENS PLACEMENT ([place]) RIGHT DIABETES;  Surgeon: Eulogio Bear, MD;  Location: [place];  Service: Ophthalmology;  Laterality: Right;  Diabetic - insulin and oral meds   CATARACT EXTRACTION W/PHACO Left 04/06/2019   Procedure: CATARACT EXTRACTION PHACO AND INTRAOCULAR LENS PLACEMENT ([facility]) LEFT DIABETIC 01:04.2     11.5%      8.58;  Surgeon: Eulogio Bear, MD;  Location: [place];  Service: Ophthalmology;  Laterality: Left;  Diabetic -  insulin and oral meds   COLONOSCOPY  10/2012   tubular adenoma, mild diverticulosis, rpt 5 yrs [facility])   COLONOSCOPY  12/2017   4 polyps - SSP, TA, diverticulosis ([facility])   dexa  06/2011   osteoporosis T -2.6 spine, -1.9 hip   dexa  07/2013   osteopenia T -2.2 femur, -1.2 spine   FLEXIBLE SIGMOIDOSCOPY  2007   pt reported normal [facility])   FOOT SURGERY Left 08/2017   HAND SURGERY Left 10/2015   [facility] arthritis repair [facility])   KNEE SURGERY Right    POLYPECTOMY     SHOULDER SURGERY Right    SKIN SURGERY  2006   skin cancer   TENDON REPAIR  08/2017   TOTAL SHOULDER ARTHROPLASTY Right 01/26/2014   Procedure: TOTAL  SHOULDER ARTHROPLASTY;  Surgeon: Johnny Bridge, MD;  Location: [place];  Service: Orthopedics;  Laterality: Right;   US ECHOCARDIOGRAPHY  10/2008   nl LV fxn, EF 50%, mild MR, mild pulm HTN    Outpatient Medications Prior to Visit  Medication Sig Dispense Refill   aspirin 81 MG tablet Take 81 mg by mouth daily.     carvedilol (COREG) 25 MG tablet Take 1 tablet (25 mg total) by mouth 2 (two) times daily with a meal. Need to schedule yearly appt with Dr. Fletcher Anon for future refills 60 tablet 3   Cholecalciferol (VITAMIN D3) 25 MCG (1000 UT) CAPS Take 1 capsule (1,000 Units total) by mouth daily. 30 capsule    imiquimod (ALDARA) 5 % cream Apply topically at bedtime. 12 each 0   insulin glargine (LANTUS) 100 UNIT/ML injection INJECT 0.45 ML (45 UNITS TOTAL) INTO THESKIN AT BEDTIME 10 mL 3   Insulin Pen Needle (NOVOFINE PLUS PEN NEEDLE) 32G X 4 MM MISC Use to administer Ozempic once a week. 30 each 0   lovastatin (MEVACOR) 40 MG tablet TAKE ONE TABLET BY MOUTH EVERY NIGHT AT BEDTIME 90 tablet 0   meclizine (ANTIVERT) 25 MG tablet Take 1 tablet (25 mg total) by mouth 2 (two) times daily as needed for up to 20 doses for dizziness or nausea. 20 tablet 0   metFORMIN (GLUCOPHAGE XR) 500 MG 24 hr tablet Take 2 tablets (1,000 mg total) by mouth daily with breakfast. 180 tablet 3   ondansetron (ZOFRAN) 4 MG tablet Take 1 tablet (4 mg total) by mouth every 8 (eight) hours as needed for up to 20 doses for nausea or vomiting. 20 tablet 0   ONETOUCH ULTRA test strip CHECK BLOOD SUGAR ONCE OR TWICE DAILY ASDIRECTED 100 each 3   ramipril (ALTACE) 10 MG capsule TAKE 1 CAPSULE BY MOUTH ONCE DAILY 90 capsule 1   No facility-administered medications prior to visit.    Allergies  Allergen Reactions   Metformin And Related Diarrhea    With higher doses, tolerating QD dosing well   Percocet [Oxycodone-Acetaminophen] Itching    Family History  Problem Relation Age of Onset   Cancer Father        lung and prostate    Diabetes Father    Heart disease Mother    Cancer Mother        unsure   Coronary artery disease Sister 63   Heart attack Sister 28   Diabetes Brother    Stroke Neg Hx    Colon cancer Neg Hx    Colon polyps Neg Hx    Esophageal cancer Neg Hx    Rectal cancer Neg Hx    Stomach cancer Neg Hx  Social History   Tobacco Use   Smoking status: Former    Packs/day: 0.25    Years: 10.00    Pack years: 2.50    Types: Cigarettes    Quit date: 10/20/1992    Years since quitting: 28.6   Smokeless tobacco: Former    Types: Chew    Quit date: 10/20/1992  Vaping Use   Vaping Use: Never used  Substance Use Topics   Alcohol use: No    Alcohol/week: 0.0 standard drinks   Drug use: No    ROS: As per history of present illness, otherwise negative  BP 130/72   Pulse 88   Ht 5\' 9"  (1.753 m)   Wt 162 lb (73.5 kg)   BMI 23.92 kg/m  Gen: awake, alert, NAD HEENT: anicteric  CV: RRR, no mrg Pulm: CTA b/l Abd: soft, NT/ND, +BS throughout Ext: no c/c/e Neuro: nonfocal   RELEVANT LABS AND IMAGING: CBC    Component Value Date/Time   WBC 8.8 06/23/2020 1232   RBC 4.70 06/23/2020 1232   HGB 14.8 06/23/2020 1232   HCT 43.2 06/23/2020 1232   PLT 370 06/23/2020 1232   MCV 91.9 06/23/2020 1232   MCH 31.5 06/23/2020 1232   MCHC 34.3 06/23/2020 1232   RDW 11.8 06/23/2020 1232   LYMPHSABS 1.0 07/12/2017 0758   MONOABS 0.7 07/12/2017 0758   EOSABS 0.3 07/12/2017 0758   BASOSABS 0.0 07/12/2017 0758    CMP     Component Value Date/Time   NA 141 03/13/2021 0809   NA 142 09/03/2007 0000   K 4.3 03/13/2021 0809   K 4.2 09/03/2007 0000   CL 106 03/13/2021 0809   CO2 27 03/13/2021 0809   GLUCOSE 150 (H) 03/13/2021 0809   BUN 17 03/13/2021 0809   CREATININE 0.79 03/13/2021 0809   CREATININE 0.69 (L) 09/15/2015 0813   CALCIUM 9.4 03/13/2021 0809   PROT 6.3 03/13/2021 0809   ALBUMIN 4.2 03/13/2021 0809   AST 13 03/13/2021 0809   ALT 15 03/13/2021 0809   ALKPHOS 66 03/13/2021  0809   BILITOT 0.8 03/13/2021 0809   GFRNONAA >60 06/23/2020 1232   GFRAA >60 04/06/2020 1313    ASSESSMENT/PLAN: 75 year old male with a history of sessile serrated and adenomatous colon polyps, hypertension, hyperlipidemia, diabetes, osteopenia who is seen to discuss surveillance colonoscopy and constipation.   History of adenomatous and sessile serrated colon polyps --we reviewed the risk and benefits of surveillance colonoscopy at his current age of 64 years.  Medically he is doing well and he is not having concerning symptoms.  He is interested in surveillance colonoscopy and I think this is reasonable.  We reviewed the risk, benefits and alternatives and he agrees and wishes to proceed --Colonoscopy in the [place]  2.  Constipation --mild and without alarm symptoms.  I recommended MiraLAX --MiraLAX 17 g daily.  This dose can be titrated to effect as we discussed.    ZE:[id number], [place], [address] [place],  [place] [address]

## 2021-05-25 NOTE — Patient Instructions (Signed)
You have been scheduled for a colonoscopy. Please follow written instructions given to you at your visit today.  Please pick up your prep supplies at the pharmacy within the next 1-3 days. If you use inhalers (even only as needed), please bring them with you on the day of your procedure.  Please purchase the following medications over the counter and take as directed: Miralax 17 grams daily for constipation.  If you are age 76 or older, your body mass index should be between 23-30. Your Body mass index is 23.92 kg/m. If this is out of the aforementioned range listed, please consider follow up with your Primary Care Provider.  If you are age 65 or younger, your body mass index should be between 19-25. Your Body mass index is 23.92 kg/m. If this is out of the aformentioned range listed, please consider follow up with your Primary Care Provider.   ________________________________________________________  The [facility] providers would like to encourage you to use [facility] to communicate with providers for non-urgent requests or questions.  Due to long hold times on the telephone, sending your provider a message by [facility] may be a faster and more efficient way to get a response.  Please allow 48 business hours for a response.  Please remember that this is for non-urgent requests.  _______________________________________________________  Due to recent changes in healthcare laws, you may see the results of your imaging and laboratory studies on MyChart before your provider has had a chance to review them.  We understand that in some cases there may be results that are confusing or concerning to you. Not all laboratory results come back in the same time frame and the provider may be waiting for multiple results in order to interpret others.  Please give [place] 48 hours in order for your provider to thoroughly review all the results before contacting the office for clarification of your results.

## 2021-05-29 NOTE — Progress Notes (Signed)
Follow-Up Visit   Subjective  Norman Marks is a 75 y.o. male who presents for the following: Procedure (Here for treatment- right cheek - scc x 1 & left chest- bcc x 1).  In addition to the 2 biopsy-proven nonmelanoma skin cancers he has agrowing painful lesion on the top of his right hand Location:  Duration:  Quality:  Associated Signs/Symptoms: Modifying Factors:  Severity:  Timing: Context:   Objective  Well appearing patient in no apparent distress; mood and affect are within normal limits. Right Cheek Biopsy site identified by nurse and me  Left Chest Biopsy site identified by nurse and me.  Right Dorsal Hand Centrally eroded moderately thick 1.2 cm crust         All skin waist up examined.   Assessment & Plan    SCC (squamous cell carcinoma), face Right Cheek  Destruction of lesion Complexity: simple   Destruction method: electrodesiccation and curettage   Informed consent: discussed and consent obtained   Timeout:  patient name, date of birth, surgical site, and procedure verified Anesthesia: the lesion was anesthetized in a standard fashion   Anesthetic:  1% lidocaine w/ epinephrine 1-100,000 local infiltration Curettage performed in three different directions: Yes   Curettage cycles:  3 Lesion length (cm):  1.8 Lesion width (cm):  1.8 Margin per side (cm):  0 Final wound size (cm):  1.8 Hemostasis achieved with:  ferric subsulfate Outcome: patient tolerated procedure well with no complications   Additional details:  Wound innoculated with 5 fluorouracil solution.  BCC (basal cell carcinoma), trunk Left Chest  Destruction of lesion Complexity: simple   Destruction method: electrodesiccation and curettage   Informed consent: discussed and consent obtained   Timeout:  patient name, date of birth, surgical site, and procedure verified Anesthesia: the lesion was anesthetized in a standard fashion   Anesthetic:  1% lidocaine w/  epinephrine 1-100,000 local infiltration Curettage performed in three different directions: Yes   Curettage cycles:  3 Lesion length (cm):  1.3 Lesion width (cm):  1.3 Margin per side (cm):  0 Final wound size (cm):  1.3 Hemostasis achieved with:  ferric subsulfate Outcome: patient tolerated procedure well with no complications   Additional details:  Wound innoculated with 5 fluorouracil solution.  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, including shoulder Right Dorsal Hand  Skin / nail biopsy Type of biopsy: tangential   Informed consent: discussed and consent obtained   Timeout: patient name, date of birth, surgical site, and procedure verified   Procedure prep:  Patient was prepped and draped in usual sterile fashion (Non sterile) Prep type:  Chlorhexidine Anesthesia: the lesion was anesthetized in a standard fashion   Anesthetic:  1% lidocaine w/ epinephrine 1-100,000 local infiltration Instrument used: flexible razor blade   Outcome: patient tolerated procedure well   Post-procedure details: wound care instructions given    Destruction of lesion Complexity: simple   Destruction method: electrodesiccation and curettage   Informed consent: discussed and consent obtained   Timeout:  patient name, date of birth, surgical site, and procedure verified Anesthesia: the lesion was anesthetized in a standard fashion   Anesthetic:  1% lidocaine w/ epinephrine 1-100,000 local infiltration Curettage cycles:  3 Lesion length (cm):  1.2 Lesion width (cm):  1.2 Margin per side (cm):  0 Final wound size (cm):  1.2 Hemostasis achieved with:  ferric subsulfate Outcome: patient tolerated procedure well with no complications   Post-procedure details: sterile dressing applied and wound care  instructions given   Dressing type: bandage and petrolatum    Specimen 1 - Surgical pathology Differential Diagnosis: bcc vs [id number]  Check Margins: No  After shave biopsy the base of  the lesion was treated with curettage plus cautery      I, Lavonna Monarch, MD, have reviewed all documentation for this visit.  The documentation on 05/29/21 for the exam, diagnosis, procedures, and orders are all accurate and complete.

## 2021-06-13 ENCOUNTER — Other Ambulatory Visit: Payer: Self-pay | Admitting: Family Medicine

## 2021-06-14 LAB — HM DIABETES EYE EXAM

## 2021-06-15 ENCOUNTER — Other Ambulatory Visit: Payer: Self-pay

## 2021-06-15 ENCOUNTER — Ambulatory Visit (INDEPENDENT_AMBULATORY_CARE_PROVIDER_SITE_OTHER): Payer: Medicare Other | Admitting: Cardiovascular Disease

## 2021-06-15 ENCOUNTER — Encounter: Payer: Self-pay | Admitting: Cardiovascular Disease

## 2021-06-15 VITALS — BP 134/70 | HR 90 | Ht 69.0 in | Wt 156.0 lb

## 2021-06-15 DIAGNOSIS — E785 Hyperlipidemia, unspecified: Secondary | ICD-10-CM

## 2021-06-15 DIAGNOSIS — I25110 Atherosclerotic heart disease of native coronary artery with unstable angina pectoris: Secondary | ICD-10-CM

## 2021-06-15 DIAGNOSIS — I200 Unstable angina: Secondary | ICD-10-CM | POA: Diagnosis not present

## 2021-06-15 DIAGNOSIS — R0602 Shortness of breath: Secondary | ICD-10-CM | POA: Diagnosis not present

## 2021-06-15 DIAGNOSIS — I447 Left bundle-branch block, unspecified: Secondary | ICD-10-CM | POA: Diagnosis not present

## 2021-06-15 DIAGNOSIS — I10 Essential (primary) hypertension: Secondary | ICD-10-CM | POA: Diagnosis not present

## 2021-06-15 MED ORDER — SODIUM CHLORIDE 0.9% FLUSH: 3.0000 mL | Freq: Two times a day (BID) | INTRAVENOUS | Status: DC

## 2021-06-15 NOTE — Patient Instructions (Addendum)
Medication Instructions:  Your physician recommends that you continue on your current medications as directed. Please refer to the Current Medication list given to you today.  *If you need a refill on your cardiac medications before your next appointment, please call your pharmacy*   Lab Work: Your physician recommends that you return for lab work (bmp, cbc) : same day as echo   If you have labs (blood work) drawn today and your tests are completely normal, you will receive your results only by: MyChart Message (if you have MyChart) OR A paper copy in the mail If you have any lab test that is abnormal or we need to change your treatment, we will call you to review the results.   Testing/Procedures: Your physician has requested that you have an echocardiogram. Echocardiography is a painless test that uses sound waves to create images of your heart. It provides your doctor with information about the size and shape of your heart and how well your heart's chambers and valves are working. This procedure takes approximately one hour. There are no restrictions for this procedure. ( To be scheduled prior to 06/26/21, ok to schedule at [facility])  Your physician has requested that you have a cardiac catheterization. Cardiac catheterization is used to diagnose and/or treat various heart conditions. Doctors may recommend this procedure for a number of different reasons. The most common reason is to evaluate chest pain. Chest pain can be a symptom of coronary artery disease (CAD), and cardiac catheterization can show whether plaque is narrowing or blocking your heart's arteries. This procedure is also used to evaluate the valves, as well as measure the blood flow and oxygen levels in different parts of your heart. For further information please visit [email or web address]. Please follow instruction sheet, as given.    Follow-Up: At [facility], you and your health needs are our priority.  As part of our  continuing mission to provide you with exceptional heart care, we have created designated Provider Care Teams.  These Care Teams include your primary Cardiologist (physician) and Advanced Practice Providers (APPs -  Physician Assistants and Nurse Practitioners) who all work together to provide you with the care you need, when you need it.  We recommend signing up for the patient portal called "MyChart".  Sign up information is provided on this After Visit Summary.  MyChart is used to connect with patients for Virtual Visits (Telemedicine).  Patients are able to view lab/test results, encounter notes, upcoming appointments, etc.  Non-urgent messages can be sent to your provider as well.   To learn more about what you can do with MyChart, go to [email or web address].    Your next appointment:   4 week(s)  The format for your next appointment:   In Person  Provider:   You may see Kathlyn Sacramento, MD or one of the following Advanced Practice Providers on your designated Care Team:   Murray Hodgkins, NP Christell Faith, PA-C Cadence Kathlen Mody, [place]  :1}    Other Instructions  [address], [place] [address] Dept: [phone] Loc: [phone]  JORIAN WILLHOITE  06/15/2021  You are scheduled for a Cardiac Catheterization on Monday, December 12 with Dr. Kathlyn Sacramento.  1. Please arrive at the [facility] [place], [place] [address] at 8:30 AM (This time is one hour before your procedure to ensure your preparation). Free valet parking service is available.   Special note:  Every effort is made to have your procedure done on time. Please understand that emergencies sometimes delay scheduled procedures.  2. Diet: Do not eat solid foods after midnight.  The patient may have clear liquids until 5am upon the day of the procedure.  3. Labs: You will need to have blood drawn  on the same day as your echo. You do not need to be fasting. Labs are to be drawn at our office.  4. Medication instructions in preparation for your procedure:   Contrast Allergy: No      Take only 22 units of insulin the night before your procedure. Do not take any insulin on the day of the procedure.  Do not take Diabetes Med Glucophage (Metformin) on the day of the procedure and HOLD 48 HOURS AFTER THE PROCEDURE.  On the morning of your procedure, take your Aspirin and any morning medicines NOT listed above.  You may use sips of water.  5. Plan for one night stay--bring personal belongings. 6. Bring a current list of your medications and current insurance cards. 7. You MUST have a responsible person to drive you home. 8. Someone MUST be with you the first 24 hours after you arrive home or your discharge will be delayed. 9. Please wear clothes that are easy to get on and off and wear slip-on shoes.  Thank you for allowing [place] to care for you!   -- [facility]

## 2021-06-15 NOTE — Progress Notes (Signed)
Cardiology Office Note   Date:  06/15/2021   ID:  Norman Marks, DOB 1945/10/31, MRN [id number]  PCP:  Ria Bush, MD  Cardiologist:   Kathlyn Sacramento, MD   Chief Complaint  Patient presents with   Other    12 Month f/u c/o sob with exertion. Meds reviewed verbally with pt.      History of Present Illness: Norman Marks is a 75 y.o. male who presents for a follow-up visit regarding coronary artery disease and left bundle branch block.  He had cardiac catheterization in 2009 which showed one-vessel coronary artery disease involving the proximal ramus (70% stenosis). He is being treated medically. Most recent nuclear stress test in June 2015 showed a fixed anteroseptal and inferoseptal defects with normal ejection fraction. The defects were felt to be due to left bundle branch block. He has known history of hypertension, diabetes and hyperlipidemia.   He had an echocardiogram done in January 2017 which showed an ejection fraction of 82-95%, grade 1 diastolic dysfunction, mildly dilated left atrium, mild mitral regurgitation and mild pulmonary hypertension.  Over the last few weeks, he started having exertional substernal chest tightness and shortness of breath.  The symptoms happen with any exertion and sometimes can last up to 30 minutes.  The symptoms are relatively new.  No palpitations, dizziness or syncope.  He has been taking his medications regularly.   Past Medical History:  Diagnosis Date   Basal cell carcinoma 02/10/2020   SEE MEDIA 8 PAGES OF PREVIOUS BCC   BCC (basal cell carcinoma) 03/22/2021   sup- left chst (CX35FU)   BPH (benign prostatic hypertrophy)    CAD (coronary artery disease) 2009   [name] (cardiolite 2009 with 70% stenosis of proximal ramus, not amenable to PCI, rec treat medically) --> [facility]   Cancer ([place])    basal cell skin cancer   DDD (degenerative disc disease), lumbar    s/p surgery   Diabetes type 2, uncontrolled 1990s   referred  to [facility] 09/2015   Diverticulosis    Headache(784.0)    History of adenomatous polyp of colon 10/2012   History of nephrolithiasis 2012   L ureter, R renal pelvis   HLD (hyperlipidemia)    HTN (hypertension)    mild   Hx of migraines    Left bundle branch block    Osteoarthritis of CMC joint of thumb 2014   s/p injections (Sypher)   Osteoarthritis of right shoulder 2015   s/p injections (Sypher)   Osteopenia 06/2011   T score -3, completed 2 yrs forteo, T score improved to -2.2 (2015)   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 (CX35FU)   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   SEE MEDIA 8 PAGES OF PREVIOUS SCC AND TREATMENTS   Vitamin D deficiency    Wears hearing aid in both ears     Past Surgical History:  Procedure Laterality Date   [place], 2008   lumbar; x 3, spinal fusion   CARDIAC CATHETERIZATION  2009   70% stenosis ostium   CARDIOVASCULAR STRESS TEST  2011   fixed defect inferior/septal walls without reversibility, LVEF 43% post stress   CATARACT EXTRACTION W/PHACO Right 03/09/2019   Procedure: CATARACT EXTRACTION PHACO AND INTRAOCULAR LENS PLACEMENT ([place]) RIGHT DIABETES;  Surgeon: Eulogio Bear, MD;  Location: [place];  Service: Ophthalmology;  Laterality: Right;  Diabetic - insulin and oral meds   CATARACT EXTRACTION  W/PHACO Left 04/06/2019   Procedure: CATARACT EXTRACTION PHACO AND INTRAOCULAR LENS PLACEMENT (IOC) LEFT DIABETIC 01:04.2     11.5%      8.58;  Surgeon: Eulogio Bear, MD;  Location: [place];  Service: Ophthalmology;  Laterality: Left;  Diabetic - insulin and oral meds   COLONOSCOPY  10/2012   tubular adenoma, mild diverticulosis, rpt 5 yrs Deatra Ina)   COLONOSCOPY  12/2017   4 polyps - SSP, TA, diverticulosis (Pyrtle)   dexa  06/2011   osteoporosis T -2.6 spine, -1.9 hip   dexa  07/2013   osteopenia T -2.2 femur, -1.2 spine   FLEXIBLE SIGMOIDOSCOPY  2007   pt reported  normal [facility])   FOOT SURGERY Left 08/2017   HAND SURGERY Left 10/2015   CMC arthritis repair [facility])   KNEE SURGERY Right    POLYPECTOMY     SHOULDER SURGERY Right    SKIN SURGERY  2006   skin cancer   TENDON REPAIR  08/2017   TOTAL SHOULDER ARTHROPLASTY Right 01/26/2014   Procedure: TOTAL SHOULDER ARTHROPLASTY;  Surgeon: Johnny Bridge, MD;  Location: [place];  Service: Orthopedics;  Laterality: Right;   US ECHOCARDIOGRAPHY  10/2008   nl LV fxn, EF 50%, mild MR, mild pulm HTN     Current Outpatient Medications  Medication Sig Dispense Refill   aspirin 81 MG tablet Take 81 mg by mouth daily.     carvedilol (COREG) 25 MG tablet Take 1 tablet (25 mg total) by mouth 2 (two) times daily with a meal. Need to schedule yearly appt with Dr. Fletcher Anon for future refills 60 tablet 3   Cholecalciferol (VITAMIN D3) 25 MCG (1000 UT) CAPS Take 1 capsule (1,000 Units total) by mouth daily. 30 capsule    imiquimod (ALDARA) 5 % cream Apply topically at bedtime. 12 each 0   insulin glargine (LANTUS) 100 UNIT/ML injection INJECT 0.45 ML (45 UNITS TOTAL) INTO THESKIN AT BEDTIME 10 mL 3   Insulin Pen Needle (NOVOFINE PLUS PEN NEEDLE) 32G X 4 MM MISC Use to administer Ozempic once a week. 30 each 0   lovastatin (MEVACOR) 40 MG tablet TAKE ONE TABLET BY MOUTH EVERY NIGHT AT BEDTIME 90 tablet 3   meclizine (ANTIVERT) 25 MG tablet Take 1 tablet (25 mg total) by mouth 2 (two) times daily as needed for up to 20 doses for dizziness or nausea. 20 tablet 0   metFORMIN (GLUCOPHAGE XR) 500 MG 24 hr tablet Take 2 tablets (1,000 mg total) by mouth daily with breakfast. 180 tablet 3   ondansetron (ZOFRAN) 4 MG tablet Take 1 tablet (4 mg total) by mouth every 8 (eight) hours as needed for up to 20 doses for nausea or vomiting. 20 tablet 0   ONETOUCH ULTRA test strip CHECK BLOOD SUGAR ONCE OR TWICE DAILY ASDIRECTED 100 each 3   ramipril (ALTACE) 10 MG capsule TAKE 1 CAPSULE BY MOUTH ONCE DAILY 90 capsule 1   OZEMPIC, 0.25  OR 0.5 MG/DOSE, 2 MG/1.5ML SOPN Inject into the skin once a week.     Sodium Sulfate-Mag Sulfate-KCl (SUTAB) [id number] MG TABS Use as directed for colonoscopy. MANUFACTURER CODES!! BIN: [id number] PCN: CN GROUP: [id number] MEMBER ID: [id number];[place]IR AS SECONDARY INSURANCE ;NO PRIOR AUTHORIZATION (Patient not taking: Reported on 06/15/2021) 24 tablet 0   No current facility-administered medications for this visit.    Allergies:   Metformin and related and Percocet [oxycodone-acetaminophen]    Social History:  The patient  reports that he quit smoking about  28 years ago. His smoking use included cigarettes. He has a 2.50 pack-year smoking history. He quit smokeless tobacco use about 28 years ago.  His smokeless tobacco use included chew. He reports that he does not drink alcohol and does not use drugs.   Family History:  The patient's family history includes Cancer in his father and mother; Coronary artery disease (age of onset: 47) in his sister; Diabetes in his brother and father; Heart attack (age of onset: 61) in his sister; Heart disease in his mother.    ROS:  Please see the history of present illness.   Otherwise, review of systems are positive for none.   All other systems are reviewed and negative.    PHYSICAL EXAM: VS:  BP 134/70 (BP Location: Left Arm, Patient Position: Sitting, Cuff Size: Normal)   Pulse 90   Ht 5\' 9"  (1.753 m)   Wt 156 lb (70.8 kg)   SpO2 98%   BMI 23.04 kg/m  , BMI Body mass index is 23.04 kg/m. GEN: Well nourished, well developed, in no acute distress  HEENT: normal  Neck: no JVD, carotid bruits, or masses Cardiac: RRR; no murmurs, rubs, or gallops,no edema  Respiratory:  clear to auscultation bilaterally, normal work of breathing GI: soft, nontender, nondistended, + BS MS: no deformity or atrophy  Skin: warm and dry, no rash Neuro:  Strength and sensation are intact Psych: euthymic mood, full affect   EKG:  EKG is ordered today. The ekg ordered today  demonstrates normal sinus rhythm with left bundle branch block.   Recent Labs: 06/23/2020: Hemoglobin 14.8; Platelets 370 03/13/2021: ALT 15; BUN 17; Creatinine, Ser 0.79; Potassium 4.3; Sodium 141    Lipid Panel    Component Value Date/Time   CHOL 114 03/13/2021 0809   TRIG 65.0 03/13/2021 0809   HDL 36.80 (L) 03/13/2021 0809   CHOLHDL 3 03/13/2021 0809   VLDL 13.0 03/13/2021 0809   LDLCALC 65 03/13/2021 0809   LDLDIRECT 65.0 01/23/2016 0931      Wt Readings from Last 3 Encounters:  06/15/21 156 lb (70.8 kg)  05/25/21 162 lb (73.5 kg)  03/17/21 161 lb 8 oz (73.3 kg)        ASSESSMENT AND PLAN:  1.  Coronary artery disease involving native coronary arteries with unstable angina: The patient reports new symptoms of exertional chest pain and shortness of breath with minimal exertion of recent onset.  Stress testing is limited by underlying left bundle branch block.  Given his symptoms, I think the best option is to proceed with left heart catheterization and possible PCI.  I discussed the procedure in details as well as risk and benefits.  2. Left bundle branch block:  most recent echocardiogram this year showed an EF of 50-55%.  Recent symptoms of shortness of breath.  I will obtain an echocardiogram before cardiac cath and if ejection fraction is low, we will do a right heart catheterization at the same time.  3. Essential hypertension: Blood pressure is well controlled.  4. Hyperlipidemia: LDL remains below 70. Continue lovastatin.   Disposition:   Proceed with echocardiogram and cardiac catheterization follow-up after.  Signed,  Kathlyn Sacramento, MD  06/15/2021 4:44 PM    [facility]

## 2021-06-15 NOTE — H&P (View-Only) (Signed)
Cardiology Office Note   Date:  06/15/2021   ID:  Norman Marks, DOB 1945-09-11, MRN [id number]  PCP:  Ria Bush, MD  Cardiologist:   Kathlyn Sacramento, MD   Chief Complaint  Patient presents with   Other    12 Month f/u c/o sob with exertion. Meds reviewed verbally with pt.      History of Present Illness: Norman Marks is a 75 y.o. male who presents for a follow-up visit regarding coronary artery disease and left bundle branch block.  He had cardiac catheterization in 2009 which showed one-vessel coronary artery disease involving the proximal ramus (70% stenosis). He is being treated medically. Most recent nuclear stress test in June 2015 showed a fixed anteroseptal and inferoseptal defects with normal ejection fraction. The defects were felt to be due to left bundle branch block. He has known history of hypertension, diabetes and hyperlipidemia.   He had an echocardiogram done in January 2017 which showed an ejection fraction of 58-85%, grade 1 diastolic dysfunction, mildly dilated left atrium, mild mitral regurgitation and mild pulmonary hypertension.  Over the last few weeks, he started having exertional substernal chest tightness and shortness of breath.  The symptoms happen with any exertion and sometimes can last up to 30 minutes.  The symptoms are relatively new.  No palpitations, dizziness or syncope.  He has been taking his medications regularly.   Past Medical History:  Diagnosis Date   Basal cell carcinoma 02/10/2020   SEE MEDIA 8 PAGES OF PREVIOUS BCC   BCC (basal cell carcinoma) 03/22/2021   sup- left chst (CX35FU)   BPH (benign prostatic hypertrophy)    CAD (coronary artery disease) 2009   [name] (cardiolite 2009 with 70% stenosis of proximal ramus, not amenable to PCI, rec treat medically) --> [facility]   Cancer ([place])    basal cell skin cancer   DDD (degenerative disc disease), lumbar    s/p surgery   Diabetes type 2, uncontrolled 1990s   referred  to [facility] 09/2015   Diverticulosis    Headache(784.0)    History of adenomatous polyp of colon 10/2012   History of nephrolithiasis 2012   L ureter, R renal pelvis   HLD (hyperlipidemia)    HTN (hypertension)    mild   Hx of migraines    Left bundle branch block    Osteoarthritis of CMC joint of thumb 2014   s/p injections (Sypher)   Osteoarthritis of right shoulder 2015   s/p injections (Sypher)   Osteopenia 06/2011   T score -3, completed 2 yrs forteo, T score improved to -2.2 (2015)   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   in situ (CX35FU)   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   SEE MEDIA 8 PAGES OF PREVIOUS SCC AND TREATMENTS   Vitamin D deficiency    Wears hearing aid in both ears     Past Surgical History:  Procedure Laterality Date   [place], 2008   lumbar; x 3, spinal fusion   CARDIAC CATHETERIZATION  2009   70% stenosis ostium   CARDIOVASCULAR STRESS TEST  2011   fixed defect inferior/septal walls without reversibility, LVEF 43% post stress   CATARACT EXTRACTION W/PHACO Right 03/09/2019   Procedure: CATARACT EXTRACTION PHACO AND INTRAOCULAR LENS PLACEMENT ([place]) RIGHT DIABETES;  Surgeon: Eulogio Bear, MD;  Location: [place];  Service: Ophthalmology;  Laterality: Right;  Diabetic - insulin and oral meds   CATARACT EXTRACTION  W/PHACO Left 04/06/2019   Procedure: CATARACT EXTRACTION PHACO AND INTRAOCULAR LENS PLACEMENT ([facility]) LEFT DIABETIC 01:04.2     11.5%      8.58;  Surgeon: Eulogio Bear, MD;  Location: [place];  Service: Ophthalmology;  Laterality: Left;  Diabetic - insulin and oral meds   COLONOSCOPY  10/2012   tubular adenoma, mild diverticulosis, rpt 5 yrs Deatra Ina)   COLONOSCOPY  12/2017   4 polyps - SSP, TA, diverticulosis ([place])   dexa  06/2011   osteoporosis T -2.6 spine, -1.9 hip   dexa  07/2013   osteopenia T -2.2 femur, -1.2 spine   FLEXIBLE SIGMOIDOSCOPY  2007   pt reported  normal [facility])   FOOT SURGERY Left 08/2017   HAND SURGERY Left 10/2015   [facility] arthritis repair [facility])   KNEE SURGERY Right    POLYPECTOMY     SHOULDER SURGERY Right    SKIN SURGERY  2006   skin cancer   TENDON REPAIR  08/2017   TOTAL SHOULDER ARTHROPLASTY Right 01/26/2014   Procedure: TOTAL SHOULDER ARTHROPLASTY;  Surgeon: Johnny Bridge, MD;  Location: [place];  Service: Orthopedics;  Laterality: Right;   US ECHOCARDIOGRAPHY  10/2008   nl LV fxn, EF 50%, mild MR, mild pulm HTN     Current Outpatient Medications  Medication Sig Dispense Refill   aspirin 81 MG tablet Take 81 mg by mouth daily.     carvedilol (COREG) 25 MG tablet Take 1 tablet (25 mg total) by mouth 2 (two) times daily with a meal. Need to schedule yearly appt with Dr. Fletcher Anon for future refills 60 tablet 3   Cholecalciferol (VITAMIN D3) 25 MCG (1000 UT) CAPS Take 1 capsule (1,000 Units total) by mouth daily. 30 capsule    imiquimod (ALDARA) 5 % cream Apply topically at bedtime. 12 each 0   insulin glargine (LANTUS) 100 UNIT/ML injection INJECT 0.45 ML (45 UNITS TOTAL) INTO THESKIN AT BEDTIME 10 mL 3   Insulin Pen Needle (NOVOFINE PLUS PEN NEEDLE) 32G X 4 MM MISC Use to administer Ozempic once a week. 30 each 0   lovastatin (MEVACOR) 40 MG tablet TAKE ONE TABLET BY MOUTH EVERY NIGHT AT BEDTIME 90 tablet 3   meclizine (ANTIVERT) 25 MG tablet Take 1 tablet (25 mg total) by mouth 2 (two) times daily as needed for up to 20 doses for dizziness or nausea. 20 tablet 0   metFORMIN (GLUCOPHAGE XR) 500 MG 24 hr tablet Take 2 tablets (1,000 mg total) by mouth daily with breakfast. 180 tablet 3   ondansetron (ZOFRAN) 4 MG tablet Take 1 tablet (4 mg total) by mouth every 8 (eight) hours as needed for up to 20 doses for nausea or vomiting. 20 tablet 0   ONETOUCH ULTRA test strip CHECK BLOOD SUGAR ONCE OR TWICE DAILY ASDIRECTED 100 each 3   ramipril (ALTACE) 10 MG capsule TAKE 1 CAPSULE BY MOUTH ONCE DAILY 90 capsule 1   OZEMPIC, 0.25  OR 0.5 MG/DOSE, 2 MG/1.5ML SOPN Inject into the skin once a week.     Sodium Sulfate-Mag Sulfate-KCl (SUTAB) [phone] MG TABS Use as directed for colonoscopy. MANUFACTURER CODES!! BIN: [id number] PCN: CN GROUP: [id number] MEMBER ID: [id number];EPP AS SECONDARY INSURANCE ;NO PRIOR AUTHORIZATION (Patient not taking: Reported on 06/15/2021) 24 tablet 0   No current facility-administered medications for this visit.    Allergies:   Metformin and related and Percocet [oxycodone-acetaminophen]    Social History:  The patient  reports that he quit smoking about  28 years ago. His smoking use included cigarettes. He has a 2.50 pack-year smoking history. He quit smokeless tobacco use about 28 years ago.  His smokeless tobacco use included chew. He reports that he does not drink alcohol and does not use drugs.   Family History:  The patient's family history includes Cancer in his father and mother; Coronary artery disease (age of onset: 38) in his sister; Diabetes in his brother and father; Heart attack (age of onset: 61) in his sister; Heart disease in his mother.    ROS:  Please see the history of present illness.   Otherwise, review of systems are positive for none.   All other systems are reviewed and negative.    PHYSICAL EXAM: VS:  BP 134/70 (BP Location: Left Arm, Patient Position: Sitting, Cuff Size: Normal)   Pulse 90   Ht 5\' 9"  (1.753 m)   Wt 156 lb (70.8 kg)   SpO2 98%   BMI 23.04 kg/m  , BMI Body mass index is 23.04 kg/m. GEN: Well nourished, well developed, in no acute distress  HEENT: normal  Neck: no JVD, carotid bruits, or masses Cardiac: RRR; no murmurs, rubs, or gallops,no edema  Respiratory:  clear to auscultation bilaterally, normal work of breathing GI: soft, nontender, nondistended, + BS MS: no deformity or atrophy  Skin: warm and dry, no rash Neuro:  Strength and sensation are intact Psych: euthymic mood, full affect   EKG:  EKG is ordered today. The ekg ordered today  demonstrates normal sinus rhythm with left bundle branch block.   Recent Labs: 06/23/2020: Hemoglobin 14.8; Platelets 370 03/13/2021: ALT 15; BUN 17; Creatinine, Ser 0.79; Potassium 4.3; Sodium 141    Lipid Panel    Component Value Date/Time   CHOL 114 03/13/2021 0809   TRIG 65.0 03/13/2021 0809   HDL 36.80 (L) 03/13/2021 0809   CHOLHDL 3 03/13/2021 0809   VLDL 13.0 03/13/2021 0809   LDLCALC 65 03/13/2021 0809   LDLDIRECT 65.0 01/23/2016 0931      Wt Readings from Last 3 Encounters:  06/15/21 156 lb (70.8 kg)  05/25/21 162 lb (73.5 kg)  03/17/21 161 lb 8 oz (73.3 kg)        ASSESSMENT AND PLAN:  1.  Coronary artery disease involving native coronary arteries with unstable angina: The patient reports new symptoms of exertional chest pain and shortness of breath with minimal exertion of recent onset.  Stress testing is limited by underlying left bundle branch block.  Given his symptoms, I think the best option is to proceed with left heart catheterization and possible PCI.  I discussed the procedure in details as well as risk and benefits.  2. Left bundle branch block:  most recent echocardiogram this year showed an EF of 50-55%.  Recent symptoms of shortness of breath.  I will obtain an echocardiogram before cardiac cath and if ejection fraction is low, we will do a right heart catheterization at the same time.  3. Essential hypertension: Blood pressure is well controlled.  4. Hyperlipidemia: LDL remains below 70. Continue lovastatin.   Disposition:   Proceed with echocardiogram and cardiac catheterization follow-up after.  Signed,  Kathlyn Sacramento, MD  06/15/2021 4:44 PM    [facility]

## 2021-06-20 ENCOUNTER — Ambulatory Visit: Payer: Medicare Other | Admitting: Dermatology

## 2021-06-20 ENCOUNTER — Other Ambulatory Visit: Payer: Self-pay

## 2021-06-20 ENCOUNTER — Other Ambulatory Visit (INDEPENDENT_AMBULATORY_CARE_PROVIDER_SITE_OTHER): Payer: Medicare Other

## 2021-06-20 ENCOUNTER — Ambulatory Visit
Admission: RE | Admit: 2021-06-20 | Discharge: 2021-06-20 | Disposition: A | Discharge status: Home / Self Care | Payer: Medicare Other | Source: Ambulatory Visit | Attending: Cardiovascular Disease | Admitting: Cardiovascular Disease

## 2021-06-20 DIAGNOSIS — I447 Left bundle-branch block, unspecified: Secondary | ICD-10-CM | POA: Insufficient documentation

## 2021-06-20 DIAGNOSIS — R0602 Shortness of breath: Secondary | ICD-10-CM | POA: Diagnosis not present

## 2021-06-20 DIAGNOSIS — I10 Essential (primary) hypertension: Secondary | ICD-10-CM | POA: Insufficient documentation

## 2021-06-20 DIAGNOSIS — I200 Unstable angina: Secondary | ICD-10-CM

## 2021-06-20 DIAGNOSIS — I358 Other nonrheumatic aortic valve disorders: Secondary | ICD-10-CM | POA: Insufficient documentation

## 2021-06-20 DIAGNOSIS — I2510 Atherosclerotic heart disease of native coronary artery without angina pectoris: Secondary | ICD-10-CM | POA: Diagnosis not present

## 2021-06-20 DIAGNOSIS — R0600 Dyspnea, unspecified: Secondary | ICD-10-CM | POA: Diagnosis present

## 2021-06-20 LAB — ECHOCARDIOGRAM COMPLETE
AR max vel: 2.77 cm2
AV Area VTI: 3.37 cm2
AV Area mean vel: 2.88 cm2
AV Mean grad: 2 mmHg
AV Peak grad: 3.6 mmHg
Ao pk vel: 0.95 m/s
Area-P 1/2: 4.6 cm2
MV VTI: 2.69 cm2
S' Lateral: 2.9 cm

## 2021-06-20 NOTE — Progress Notes (Signed)
*  PRELIMINARY RESULTS* Echocardiogram 2D Echocardiogram has been performed.  Norman Marks 06/20/2021, 10:37 AM

## 2021-06-21 LAB — CBC WITH DIFFERENTIAL/PLATELET
Basophils Absolute: 0 10*3/uL (ref 0.0–0.2)
Basos: 0 %
EOS (ABSOLUTE): 0.1 10*3/uL (ref 0.0–0.4)
Eos: 1 %
Hematocrit: 42.6 % (ref 37.5–51.0)
Hemoglobin: 14.4 g/dL (ref 13.0–17.7)
Immature Grans (Abs): 0.1 10*3/uL (ref 0.0–0.1)
Immature Granulocytes: 1 %
Lymphocytes Absolute: 1.4 10*3/uL (ref 0.7–3.1)
Lymphs: 19 %
MCH: 31.2 pg (ref 26.6–33.0)
MCHC: 33.8 g/dL (ref 31.5–35.7)
MCV: 92 fL (ref 79–97)
Monocytes Absolute: 0.6 10*3/uL (ref 0.1–0.9)
Monocytes: 8 %
Neutrophils Absolute: 5.4 10*3/uL (ref 1.4–7.0)
Neutrophils: 71 %
Platelets: 254 10*3/uL (ref 150–450)
RBC: 4.62 x10E6/uL (ref 4.14–5.80)
RDW: 11.9 % (ref 11.6–15.4)
WBC: 7.5 10*3/uL (ref 3.4–10.8)

## 2021-06-21 LAB — BASIC METABOLIC PANEL
BUN/Creatinine Ratio: 15 (ref 10–24)
BUN: 13 mg/dL (ref 8–27)
CO2: 26 mmol/L (ref 20–29)
Calcium: 9.8 mg/dL (ref 8.6–10.2)
Chloride: 103 mmol/L (ref 96–106)
Creatinine, Ser: 0.85 mg/dL (ref 0.76–1.27)
Glucose: 107 mg/dL — ABNORMAL HIGH (ref 70–99)
Potassium: 4.5 mmol/L (ref 3.5–5.2)
Sodium: 143 mmol/L (ref 134–144)
eGFR: 90 mL/min/{1.73_m2} (ref >59)

## 2021-06-26 ENCOUNTER — Ambulatory Visit
Admission: RE | Admit: 2021-06-26 | Discharge: 2021-06-26 | Disposition: A | Discharge status: Home / Self Care | Payer: Medicare Other | Attending: Cardiovascular Disease | Admitting: Cardiovascular Disease

## 2021-06-26 ENCOUNTER — Encounter: Payer: Self-pay | Admitting: Cardiovascular Disease

## 2021-06-26 ENCOUNTER — Encounter
Admission: RE | Disposition: A | Discharge status: Home / Self Care | Payer: Self-pay | Source: Home / Self Care | Attending: Cardiovascular Disease

## 2021-06-26 ENCOUNTER — Other Ambulatory Visit: Payer: Self-pay

## 2021-06-26 DIAGNOSIS — E119 Type 2 diabetes mellitus without complications: Secondary | ICD-10-CM | POA: Diagnosis not present

## 2021-06-26 DIAGNOSIS — R0602 Shortness of breath: Secondary | ICD-10-CM | POA: Insufficient documentation

## 2021-06-26 DIAGNOSIS — I10 Essential (primary) hypertension: Secondary | ICD-10-CM | POA: Insufficient documentation

## 2021-06-26 DIAGNOSIS — I447 Left bundle-branch block, unspecified: Secondary | ICD-10-CM | POA: Insufficient documentation

## 2021-06-26 DIAGNOSIS — I25110 Atherosclerotic heart disease of native coronary artery with unstable angina pectoris: Secondary | ICD-10-CM | POA: Diagnosis present

## 2021-06-26 DIAGNOSIS — I200 Unstable angina: Secondary | ICD-10-CM

## 2021-06-26 DIAGNOSIS — E785 Hyperlipidemia, unspecified: Secondary | ICD-10-CM | POA: Insufficient documentation

## 2021-06-26 HISTORY — PX: LEFT HEART CATH AND CORONARY ANGIOGRAPHY: CATH118249

## 2021-06-26 LAB — GLUCOSE, CAPILLARY: Glucose-Capillary: 131 mg/dL — ABNORMAL HIGH (ref 70–99)

## 2021-06-26 SURGERY — LEFT HEART CATH AND CORONARY ANGIOGRAPHY
Anesthesia: Moderate Sedation

## 2021-06-26 MED ORDER — LIDOCAINE HCL 1 % IJ SOLN
INTRAMUSCULAR | Status: AC
  Filled 2021-06-26: qty 20

## 2021-06-26 MED ORDER — VERAPAMIL HCL 2.5 MG/ML IV SOLN
INTRAVENOUS | Status: DC | PRN
  Administered 2021-06-26: 2.5 mg via INTRA_ARTERIAL

## 2021-06-26 MED ORDER — SODIUM CHLORIDE 0.9% FLUSH: 3.0000 mL | INTRAVENOUS | Status: DC | PRN

## 2021-06-26 MED ORDER — HEPARIN (PORCINE) IN NACL 1000-0.9 UT/500ML-% IV SOLN
INTRAVENOUS | Status: AC
  Filled 2021-06-26: qty 1000

## 2021-06-26 MED ORDER — MIDAZOLAM HCL 2 MG/2ML IJ SOLN
INTRAMUSCULAR | Status: DC | PRN
  Administered 2021-06-26: 1 mg via INTRAVENOUS

## 2021-06-26 MED ORDER — SODIUM CHLORIDE 0.9 % IV SOLN: INTRAVENOUS | Status: DC

## 2021-06-26 MED ORDER — HEPARIN SODIUM (PORCINE) 1000 UNIT/ML IJ SOLN
INTRAMUSCULAR | Status: DC | PRN
  Administered 2021-06-26: 3500 [IU] via INTRAVENOUS

## 2021-06-26 MED ORDER — FENTANYL CITRATE (PF) 100 MCG/2ML IJ SOLN
INTRAMUSCULAR | Status: DC | PRN
  Administered 2021-06-26: 25 ug via INTRAVENOUS

## 2021-06-26 MED ORDER — HEPARIN (PORCINE) IN NACL 2000-0.9 UNIT/L-% IV SOLN
INTRAVENOUS | Status: DC | PRN
  Administered 2021-06-26: 1000 mL

## 2021-06-26 MED ORDER — SODIUM CHLORIDE 0.9 % IV SOLN: 250.0000 mL | INTRAVENOUS | Status: DC | PRN

## 2021-06-26 MED ORDER — HEPARIN SODIUM (PORCINE) 1000 UNIT/ML IJ SOLN
INTRAMUSCULAR | Status: AC
  Filled 2021-06-26: qty 10

## 2021-06-26 MED ORDER — ASPIRIN 81 MG PO CHEW: 81.0000 mg | CHEWABLE_TABLET | ORAL | Status: DC

## 2021-06-26 MED ORDER — ACETAMINOPHEN 325 MG PO TABS: 650.0000 mg | ORAL_TABLET | ORAL | Status: DC | PRN

## 2021-06-26 MED ORDER — FENTANYL CITRATE (PF) 100 MCG/2ML IJ SOLN
INTRAMUSCULAR | Status: AC
  Filled 2021-06-26: qty 2

## 2021-06-26 MED ORDER — VERAPAMIL HCL 2.5 MG/ML IV SOLN
INTRAVENOUS | Status: AC
  Filled 2021-06-26: qty 2

## 2021-06-26 MED ORDER — LIDOCAINE HCL (PF) 1 % IJ SOLN
INTRAMUSCULAR | Status: DC | PRN
  Administered 2021-06-26: 2 mL

## 2021-06-26 MED ORDER — SODIUM CHLORIDE 0.9% FLUSH: 3.0000 mL | Freq: Two times a day (BID) | INTRAVENOUS | Status: DC

## 2021-06-26 MED ORDER — ISOSORBIDE MONONITRATE ER 30 MG PO TB24: 30.0000 mg | ORAL_TABLET | Freq: Every day | ORAL | 6 refills | Status: DC

## 2021-06-26 MED ORDER — IOHEXOL 300 MG/ML  SOLN
INTRAMUSCULAR | Status: DC | PRN
  Administered 2021-06-26: 67 mL

## 2021-06-26 MED ORDER — MIDAZOLAM HCL 2 MG/2ML IJ SOLN
INTRAMUSCULAR | Status: AC
  Filled 2021-06-26: qty 2

## 2021-06-26 MED ORDER — ONDANSETRON HCL 4 MG/2ML IJ SOLN: 4.0000 mg | Freq: Four times a day (QID) | INTRAMUSCULAR | Status: DC | PRN

## 2021-06-26 SURGICAL SUPPLY — 10 items
CATH INFINITI 5FR JK (CATHETERS) ×1 IMPLANT
DEVICE RAD TR BAND REGULAR (VASCULAR PRODUCTS) ×1 IMPLANT
DRAPE BRACHIAL (DRAPES) ×1 IMPLANT
GLIDESHEATH SLEND SS 6F .021 (SHEATH) ×1 IMPLANT
GUIDEWIRE INQWIRE 1.5J.035X260 (WIRE) IMPLANT
INQWIRE 1.5J .035X260CM (WIRE) ×2
PACK CARDIAC CATH (CUSTOM PROCEDURE TRAY) ×2 IMPLANT
PROTECTION STATION PRESSURIZED (MISCELLANEOUS) ×2
SET ATX SIMPLICITY (MISCELLANEOUS) ×1 IMPLANT
STATION PROTECTION PRESSURIZED (MISCELLANEOUS) IMPLANT

## 2021-06-26 NOTE — Interval H&P Note (Signed)
Cath Lab Visit (complete for each Cath Lab visit)  Clinical Evaluation Leading to the Procedure:   ACS: No.  Non-ACS:    Anginal Classification: CCS III  Anti-ischemic medical therapy: Minimal Therapy (1 class of medications)  Non-Invasive Test Results: No non-invasive testing performed  Prior CABG: No previous CABG      History and Physical Interval Note:  06/26/2021 9:44 AM  Norman Marks  has presented today for surgery, with the diagnosis of LT Heart Cath   Unstable Angina.  The various methods of treatment have been discussed with the patient and family. After consideration of risks, benefits and other options for treatment, the patient has consented to  Procedure(s): LEFT HEART CATH AND CORONARY ANGIOGRAPHY (N/A) as a surgical intervention.  The patient's history has been reviewed, patient examined, no change in status, stable for surgery.  I have reviewed the patient's chart and labs.  Questions were answered to the patient's satisfaction.     Kathlyn Sacramento

## 2021-07-03 ENCOUNTER — Other Ambulatory Visit: Payer: Self-pay | Admitting: Family Medicine

## 2021-07-03 DIAGNOSIS — E118 Type 2 diabetes mellitus with unspecified complications: Secondary | ICD-10-CM

## 2021-07-05 ENCOUNTER — Ambulatory Visit (INDEPENDENT_AMBULATORY_CARE_PROVIDER_SITE_OTHER): Payer: Medicare Other | Admitting: Cardiovascular Disease

## 2021-07-05 ENCOUNTER — Other Ambulatory Visit: Payer: Self-pay

## 2021-07-05 ENCOUNTER — Encounter: Payer: Self-pay | Admitting: Cardiovascular Disease

## 2021-07-05 VITALS — BP 120/64 | HR 86 | Ht 69.0 in | Wt 154.1 lb

## 2021-07-05 DIAGNOSIS — I447 Left bundle-branch block, unspecified: Secondary | ICD-10-CM

## 2021-07-05 DIAGNOSIS — I10 Essential (primary) hypertension: Secondary | ICD-10-CM

## 2021-07-05 DIAGNOSIS — I25118 Atherosclerotic heart disease of native coronary artery with other forms of angina pectoris: Secondary | ICD-10-CM

## 2021-07-05 DIAGNOSIS — E785 Hyperlipidemia, unspecified: Secondary | ICD-10-CM

## 2021-07-05 MED ORDER — ROSUVASTATIN CALCIUM 20 MG PO TABS: 20.0000 mg | ORAL_TABLET | Freq: Every day | ORAL | 3 refills | Status: DC

## 2021-07-05 MED ORDER — CARVEDILOL 25 MG PO TABS: 25.0000 mg | ORAL_TABLET | Freq: Two times a day (BID) | ORAL | 3 refills | Status: DC

## 2021-07-05 NOTE — Progress Notes (Signed)
Cardiology Office Note   Date:  07/05/2021   ID:  Norman Marks, DOB 05-18-1946, MRN [id number]  PCP:  Ria Bush, MD  Cardiologist:   Kathlyn Sacramento, MD   Chief Complaint  Patient presents with   Other    Post cath c/o left side numbness. Meds reviewed verbally with pt.      History of Present Illness: Norman Marks is a 75 y.o. male who presents for a follow-up visit regarding coronary artery disease and left bundle branch block.  He had cardiac catheterization in 2009 which showed one-vessel coronary artery disease involving the proximal ramus (70% stenosis). He is being treated medically. Most recent nuclear stress test in June 2015 showed a fixed anteroseptal and inferoseptal defects with normal ejection fraction. The defects were felt to be due to left bundle branch block. He has known history of hypertension, diabetes and hyperlipidemia.   He had an echocardiogram done in January 2017 which showed an ejection fraction of 23-76%, grade 1 diastolic dysfunction, mildly dilated left atrium, mild mitral regurgitation and mild pulmonary hypertension.  He was seen recently for classic anginal symptoms of recent onset.  He had an echocardiogram done which showed an EF of 50 to 55% with no significant valvular abnormalities.  I proceeded with cardiac catheterization which showed 70% mid LAD stenosis but there was diffuse 40% disease extending all the way to the ostium, 60% proximal ramus stenosis and no other obstructive disease.  LVEDP was mildly elevated.  I added Imdur 30 mg daily. He stopped Imdur after about 5 days due to dizziness and headache.  He reports improvement in chest pain overall and no shortness of breath.  He has some numbness in his left thigh but his femoral pulses normal.  He did have previous back surgery.    Past Medical History:  Diagnosis Date   Basal cell carcinoma 02/10/2020   SEE MEDIA 8 PAGES OF PREVIOUS BCC   BCC (basal cell carcinoma)  03/22/2021   sup- left chst (CX35FU)   BPH (benign prostatic hypertrophy)    CAD (coronary artery disease) 2009   Norman Marks (cardiolite 2009 with 70% stenosis of proximal ramus, not amenable to PCI, rec treat medically) --> Norman Marks   Cancer ([place])    basal cell skin cancer   DDD (degenerative disc disease), lumbar    s/p surgery   Diabetes type 2, uncontrolled 1990s   referred to [facility] 09/2015   Diverticulosis    Headache(784.0)    History of adenomatous polyp of colon 10/2012   History of nephrolithiasis 2012   L ureter, R renal pelvis   HLD (hyperlipidemia)    HTN (hypertension)    mild   Hx of migraines    Left bundle branch block    Osteoarthritis of CMC joint of thumb 2014   s/p injections (Sypher)   Osteoarthritis of right shoulder 2015   s/p injections (Sypher)   Osteopenia 06/2011   T score -3, completed 2 yrs forteo, T score improved to -2.2 (2015)   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   in situ (CX35FU)   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   SEE MEDIA 8 PAGES OF PREVIOUS SCC AND TREATMENTS   Vitamin D deficiency    Wears hearing aid in both ears     Past Surgical History:  Procedure Laterality Date   [place], 2008   lumbar; x 3, spinal fusion   CARDIAC CATHETERIZATION  2009  70% stenosis ostium   CARDIOVASCULAR STRESS TEST  2011   fixed defect inferior/septal walls without reversibility, LVEF 43% post stress   CATARACT EXTRACTION W/PHACO Right 03/09/2019   Procedure: CATARACT EXTRACTION PHACO AND INTRAOCULAR LENS PLACEMENT ([place]) RIGHT DIABETES;  Surgeon: Eulogio Bear, MD;  Location: [place];  Service: Ophthalmology;  Laterality: Right;  Diabetic - insulin and oral meds   CATARACT EXTRACTION W/PHACO Left 04/06/2019   Procedure: CATARACT EXTRACTION PHACO AND INTRAOCULAR LENS PLACEMENT ([facility]) LEFT DIABETIC 01:04.2     11.5%      8.58;  Surgeon: Eulogio Bear, MD;  Location: [place];   Service: Ophthalmology;  Laterality: Left;  Diabetic - insulin and oral meds   COLONOSCOPY  10/2012   tubular adenoma, mild diverticulosis, rpt 5 yrs Deatra Ina)   COLONOSCOPY  12/2017   4 polyps - SSP, TA, diverticulosis ([place])   dexa  06/2011   osteoporosis T -2.6 spine, -1.9 hip   dexa  07/2013   osteopenia T -2.2 femur, -1.2 spine   FLEXIBLE SIGMOIDOSCOPY  2007   pt reported normal [facility])   FOOT SURGERY Left 08/2017   HAND SURGERY Left 10/2015   CMC arthritis repair [facility])   KNEE SURGERY Right    LEFT HEART CATH AND CORONARY ANGIOGRAPHY N/A 06/26/2021   Procedure: LEFT HEART CATH AND CORONARY ANGIOGRAPHY;  Surgeon: Wellington Hampshire, MD;  Location: [place] [facility];  Service: Cardiovascular;  Laterality: N/A;   POLYPECTOMY     SHOULDER SURGERY Right    SKIN SURGERY  2006   skin cancer   TENDON REPAIR  08/2017   TOTAL SHOULDER ARTHROPLASTY Right 01/26/2014   Procedure: TOTAL SHOULDER ARTHROPLASTY;  Surgeon: Johnny Bridge, MD;  Location: [place];  Service: Orthopedics;  Laterality: Right;   US ECHOCARDIOGRAPHY  10/2008   nl LV fxn, EF 50%, mild MR, mild pulm HTN     Current Outpatient Medications  Medication Sig Dispense Refill   aspirin 81 MG tablet Take 81 mg by mouth daily.     carvedilol (COREG) 25 MG tablet Take 1 tablet (25 mg total) by mouth 2 (two) times daily with a meal. Need to schedule yearly appt with Dr. Fletcher Anon for future refills 60 tablet 3   Cholecalciferol (VITAMIN D3) 25 MCG (1000 UT) CAPS Take 1 capsule (1,000 Units total) by mouth daily. 30 capsule    imiquimod (ALDARA) 5 % cream Apply topically at bedtime. 12 each 0   insulin glargine (LANTUS) 100 UNIT/ML injection INJECT 0.45 ML (45 UNITS TOTAL) INTO THESKIN AT BEDTIME (Patient taking differently: 40 Units at bedtime.) 10 mL 3   Insulin Pen Needle (NOVOFINE PLUS PEN NEEDLE) 32G X 4 MM MISC Use to administer Ozempic once a week. 30 each 0   meclizine (ANTIVERT) 25 MG tablet Take 1 tablet (25 mg total)  by mouth 2 (two) times daily as needed for up to 20 doses for dizziness or nausea. 20 tablet 0   metFORMIN (GLUCOPHAGE XR) 500 MG 24 hr tablet Take 2 tablets (1,000 mg total) by mouth daily with breakfast. 180 tablet 3   ondansetron (ZOFRAN) 4 MG tablet Take 1 tablet (4 mg total) by mouth every 8 (eight) hours as needed for up to 20 doses for nausea or vomiting. 20 tablet 0   ONETOUCH ULTRA test strip CHECK BLOOD SUGAR ONCE OR TWICE DAILY ASDIRECTED 100 each 3   OZEMPIC, 0.25 OR 0.5 MG/DOSE, 2 MG/1.5ML SOPN Inject 0.5 mg into the skin once  a week.     polyethylene glycol (MIRALAX / GLYCOLAX) 17 g packet Take 17 g by mouth every other day.     ramipril (ALTACE) 10 MG capsule TAKE 1 CAPSULE BY MOUTH ONCE DAILY 90 capsule 1   Sodium Sulfate-Mag Sulfate-KCl (SUTAB) [phone] MG TABS Use as directed for colonoscopy. MANUFACTURER CODES!! BIN: [id number] PCN: CN GROUP: [id number] MEMBER ID: [id number];[employer] AS SEC[employer]ARY INSURANCE ;NO PRIOR AUTHORIZATION 24 tablet 0   Current Facility-Administered Medications  Medication Dose Route Frequency Provider Last Rate Last Admin   sodium chloride flush (NS) 0.9 % injection 3 mL  3 mL Intravenous Q12H Wellington Hampshire, MD        Allergies:   Metformin and related and Percocet [oxycodone-acetaminophen]    Social History:  The patient  reports that he quit smoking about 28 years ago. His smoking use included cigarettes. He has a 2.50 pack-year smoking history. He quit smokeless tobacco use about 28 years ago.  His smokeless tobacco use included chew. He reports that he does not drink alcohol and does not use drugs.   Family History:  The patient's family history includes Cancer in his father and mother; Coronary artery disease (age of onset: 75) in his sister; Diabetes in his brother and father; Heart attack (age of onset: 32) in his sister; Heart disease in his mother.    ROS:  Please see the history of present illness.   Otherwise, review of systems are positive  for none.   All other systems are reviewed and negative.    PHYSICAL EXAM: VS:  BP 120/64 (BP Location: Left Arm, Patient Position: Sitting, Cuff Size: Normal)    Pulse 86    Ht 5\' 9"  (1.753 m)    Wt 154 lb 2 oz (69.9 kg)    SpO2 93%    BMI 22.76 kg/m  , BMI Body mass index is 22.76 kg/m. GEN: Well nourished, well developed, in no acute distress  HEENT: normal  Neck: no JVD, carotid bruits, or masses Cardiac: RRR; no murmurs, rubs, or gallops,no edema  Respiratory:  clear to auscultation bilaterally, normal work of breathing GI: soft, nontender, nondistended, + BS MS: no deformity or atrophy  Skin: warm and dry, no rash Neuro:  Strength and sensation are intact Psych: euthymic mood, full affect Right radial pulses normal no hematoma.   EKG:  EKG is ordered today. The ekg ordered today demonstrates normal sinus rhythm with left bundle branch block.   Recent Labs: 03/13/2021: ALT 15 06/20/2021: BUN 13; Creatinine, Ser 0.85; Hemoglobin 14.4; Platelets 254; Potassium 4.5; Sodium 143    Lipid Panel    Component Value Date/Time   CHOL 114 03/13/2021 0809   TRIG 65.0 03/13/2021 0809   HDL 36.80 (L) 03/13/2021 0809   CHOLHDL 3 03/13/2021 0809   VLDL 13.0 03/13/2021 0809   LDLCALC 65 03/13/2021 0809   LDLDIRECT 65.0 01/23/2016 0931      Wt Readings from Last 3 Encounters:  07/05/21 154 lb 2 oz (69.9 kg)  06/26/21 156 lb (70.8 kg)  06/15/21 156 lb (70.8 kg)        ASSESSMENT AND PLAN:  1.  Coronary artery disease involving native coronary arteries with stable angina: The patient reports improvement in symptoms but he did not tolerate Imdur due to dizziness and headache and this will be discontinued for now.  I advised him to resume his physical activities and report to me if his exertional chest pain worsens.  LAD PCI can be considered if  needed.  2. Left bundle branch block: Recent echocardiogram showed stable LV systolic function with an EF of 50 to 55%.  3. Essential  hypertension: Blood pressure is well controlled.  4. Hyperlipidemia: He has been on lovastatin for a long time and his most recent LDL was 65.  The new recommendations is to get LDL below 55 and thus I elected to switch him to rosuvastatin 20 mg daily.  Recheck lipid and liver profile in 2 months.   Disposition: Follow-up in 4 months.  Signed,  Kathlyn Sacramento, MD  07/05/2021 10:23 AM    [place]

## 2021-07-05 NOTE — Patient Instructions (Signed)
Medication Instructions:  Your physician has recommended you make the following change in your medication:   1) STOP Imdur  2) STOP Lovastatin  3) START Rosuvastatin 20 mg daily. An Rx has been sent to your pharmacy  *If you need a refill on your cardiac medications before your next appointment, please call your pharmacy*   Lab Work: Your physician recommends that you return for a FASTING lipid profile and lft: in 2 months  If you have labs (blood work) drawn today and your tests are completely normal, you will receive your results only by: [place] (if you have MyChart) OR A paper copy in the mail If you have any lab test that is abnormal or we need to change your treatment, we will call you to review the results.   Testing/Procedures: None ordered   Follow-Up: At [facility], you and your health needs are our priority.  As part of our continuing mission to provide you with exceptional heart care, we have created designated Provider Care Teams.  These Care Teams include your primary Cardiologist (physician) and Advanced Practice Providers (APPs -  Physician Assistants and Nurse Practitioners) who all work together to provide you with the care you need, when you need it.  We recommend signing up for the patient portal called "MyChart".  Sign up information is provided on this After Visit Summary.  MyChart is used to connect with patients for Virtual Visits (Telemedicine).  Patients are able to view lab/test results, encounter notes, upcoming appointments, etc.  Non-urgent messages can be sent to your provider as well.   To learn more about what you can do with MyChart, go to [email or web address].    Your next appointment:   4 month(s)  The format for your next appointment:   In Person  Provider:   You may see Kathlyn Sacramento, MD or one of the following Advanced Practice Providers on your designated Care Team:   Murray Hodgkins, NP Christell Faith, PA-C Cadence Kathlen Mody,  PA-C{    Other Instructions N/A

## 2021-07-07 ENCOUNTER — Encounter: Payer: Self-pay | Admitting: Family Medicine

## 2021-07-16 HISTORY — PX: COLONOSCOPY: SHX174

## 2021-07-27 ENCOUNTER — Ambulatory Visit (AMBULATORY_SURGERY_CENTER): Payer: Medicare Other | Admitting: Internal Medicine

## 2021-07-27 ENCOUNTER — Encounter: Payer: Self-pay | Admitting: Internal Medicine

## 2021-07-27 VITALS — BP 95/56 | HR 88 | Temp 98.7°F | Resp 12 | Wt 162.0 lb

## 2021-07-27 DIAGNOSIS — Z86010 Personal history of colonic polyps: Secondary | ICD-10-CM

## 2021-07-27 DIAGNOSIS — D120 Benign neoplasm of cecum: Secondary | ICD-10-CM

## 2021-07-27 DIAGNOSIS — K5900 Constipation, unspecified: Secondary | ICD-10-CM

## 2021-07-27 MED ORDER — SODIUM CHLORIDE 0.9 % IV SOLN: 500.0000 mL | Freq: Once | INTRAVENOUS | Status: DC

## 2021-07-27 NOTE — Progress Notes (Signed)
GI PREPROCEDURE H&P  This patient was evaluated in the office by Dr. Hilarie Fredrickson May 25, 2021 for constipation and history of polyps.  H&P as listed below.  Interval history is remarkable for cardiac catheterization June 26, 2021 for which maximal medical therapy recommended.  He is inadvertently on my schedule today for surveillance colonoscopy.  He is pleased to have me perform his exam.    HPI: Norman Marks is a 76 year old male with a history of sessile serrated and adenomatous colon polyps, hypertension, hyperlipidemia, diabetes, osteopenia who is seen to discuss surveillance colonoscopy and constipation.  He is here with his wife.  He is known to me from his last surveillance colonoscopy performed on 12/24/2017.  This revealed 4 polyps all less than a centimeter.  These were removed.  There were scattered left-sided diverticulosis.  These polyps were found to be SSP x3 and tubular adenoma x1.   He reports overall he has been feeling well.  No recent chest pain or shortness of breath.  He has struggled with harder stools and less frequent stool.  Stools are harder to pass and on average occur about every other day.  This is not associated with abdominal pain.  On advice from primary care he tried increased fiber but has not seen increased stool frequency.  No rectal bleeding or melena.  He denies heartburn, pyrosis, dysphagia and odynophagia.  Appetite has been good.  No frequent nausea or vomiting.       Past Medical History:  Diagnosis Date   Basal cell carcinoma 02/10/2020    SEE MEDIA 8 PAGES OF PREVIOUS BCC   BCC (basal cell carcinoma) 03/22/2021    sup- left chst (CX35FU)   BPH (benign prostatic hypertrophy)     CAD (coronary artery disease) 2009    [name] (cardiolite 2009 with 70% stenosis of proximal ramus, not amenable to PCI, rec treat medically) --> [name]   Cancer ([place])      basal cell skin cancer   DDD (degenerative disc disease), lumbar      s/p surgery   Diabetes  type 2, uncontrolled 1990s    ref[facility] to [facility] 09/2015   Diverticulosis     Headache(784.0)     History of adenomatous polyp of colon 10/2012   History of nephrolithiasis 2012    L ureter, R renal pelvis   HLD (hyperlipidemia)     HTN (hypertension)      mild   Hx of migraines     Left bundle branch block     Osteoarthritis of CMC joint of thumb 2014    s/p injections (Sypher)   Osteoarthritis of right shoulder 2015    s/p injections (Sypher)   Osteopenia 06/2011    T score -3, completed 2 yrs forteo, T score improved to -2.2 (2015)   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    in situ (CX35FU)   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    SEE MEDIA 8 PAGES OF PREVIOUS SCC AND TREATMENTS   Vitamin D deficiency     Wears hearing aid in both ears             Past Surgical History:  Procedure Laterality Date   [place], 2008    lumbar; x 3, spinal fusion   CARDIAC CATHETERIZATION   2009    70% stenosis ostium   CARDIOVASCULAR STRESS TEST   2011    fixed defect inferior/septal walls without reversibility, LVEF 43% post stress  CATARACT EXTRACTION W/PHACO Right 03/09/2019    Procedure: CATARACT EXTRACTION PHACO AND INTRAOCULAR LENS PLACEMENT ([place]) RIGHT DIABETES;  Surgeon: Eulogio Bear, MD;  Location: [place];  Service: Ophthalmology;  Laterality: Right;  Diabetic - insulin and oral meds   CATARACT EXTRACTION W/PHACO Left 04/06/2019    Procedure: CATARACT EXTRACTION PHACO AND INTRAOCULAR LENS PLACEMENT ([facility]) LEFT DIABETIC 01:04.2     11.5%      8.58;  Surgeon: Eulogio Bear, MD;  Location: [place];  Service: Ophthalmology;  Laterality: Left;  Diabetic - insulin and oral meds   COLONOSCOPY   10/2012    tubular adenoma, mild diverticulosis, rpt 5 yrs Deatra Ina)   COLONOSCOPY   12/2017    4 polyps - SSP, TA, diverticulosis ([place])   dexa   06/2011    osteoporosis T -2.6 spine, -1.9 hip   dexa   07/2013     osteopenia T -2.2 femur, -1.2 spine   FLEXIBLE SIGMOIDOSCOPY   2007    pt reported normal [facility])   FOOT SURGERY Left 08/2017   HAND SURGERY Left 10/2015    [facility] arthritis repair [facility])   KNEE SURGERY Right     POLYPECTOMY       SHOULDER SURGERY Right     SKIN SURGERY   2006    skin cancer   TENDON REPAIR   08/2017   TOTAL SHOULDER ARTHROPLASTY Right 01/26/2014    Procedure: TOTAL SHOULDER ARTHROPLASTY;  Surgeon: Johnny Bridge, MD;  Location: [place];  Service: Orthopedics;  Laterality: Right;   US ECHOCARDIOGRAPHY   10/2008    nl LV fxn, EF 50%, mild MR, mild pulm HTN            Outpatient Medications Prior to Visit  Medication Sig Dispense Refill   aspirin 81 MG tablet Take 81 mg by mouth daily.       carvedilol (COREG) 25 MG tablet Take 1 tablet (25 mg total) by mouth 2 (two) times daily with a meal. Need to schedule yearly appt with Dr. Fletcher Anon for future refills 60 tablet 3   Cholecalciferol (VITAMIN D3) 25 MCG (1000 UT) CAPS Take 1 capsule (1,000 Units total) by mouth daily. 30 capsule     imiquimod (ALDARA) 5 % cream Apply topically at bedtime. 12 each 0   insulin glargine (LANTUS) 100 UNIT/ML injection INJECT 0.45 ML (45 UNITS TOTAL) INTO THESKIN AT BEDTIME 10 mL 3   Insulin Pen Needle (NOVOFINE PLUS PEN NEEDLE) 32G X 4 MM MISC Use to administer Ozempic once a week. 30 each 0   lovastatin (MEVACOR) 40 MG tablet TAKE ONE TABLET BY MOUTH EVERY NIGHT AT BEDTIME 90 tablet 0   meclizine (ANTIVERT) 25 MG tablet Take 1 tablet (25 mg total) by mouth 2 (two) times daily as needed for up to 20 doses for dizziness or nausea. 20 tablet 0   metFORMIN (GLUCOPHAGE XR) 500 MG 24 hr tablet Take 2 tablets (1,000 mg total) by mouth daily with breakfast. 180 tablet 3   ondansetron (ZOFRAN) 4 MG tablet Take 1 tablet (4 mg total) by mouth every 8 (eight) hours as needed for up to 20 doses for nausea or vomiting. 20 tablet 0   ONETOUCH ULTRA test strip CHECK BLOOD SUGAR ONCE OR TWICE DAILY  ASDIRECTED 100 each 3   ramipril (ALTACE) 10 MG capsule TAKE 1 CAPSULE BY MOUTH ONCE DAILY 90 capsule 1    No facility-administered medications prior to visit.  Allergies  Allergen Reactions   Metformin And Related Diarrhea      With higher doses, tolerating QD dosing well   Percocet [Oxycodone-Acetaminophen] Itching           Family History  Problem Relation Age of Onset   Cancer Father          lung and prostate   Diabetes Father     Heart disease Mother     Cancer Mother          unsure   Coronary artery disease Sister 47   Heart attack Sister 50   Diabetes Brother     Stroke Neg Hx     Colon cancer Neg Hx     Colon polyps Neg Hx     Esophageal cancer Neg Hx     Rectal cancer Neg Hx     Stomach cancer Neg Hx        Social History         Tobacco Use   Smoking status: Former      Packs/day: 0.25      Years: 10.00      Pack years: 2.50      Types: Cigarettes      Quit date: 10/20/1992      Years since quitting: 28.6   Smokeless tobacco: Former      Types: Chew      Quit date: 10/20/1992  Vaping Use   Vaping Use: Never used  Substance Use Topics   Alcohol use: No      Alcohol/week: 0.0 standard drinks   Drug use: No      ROS: As per history of present illness, otherwise negative   BP 130/72    Pulse 88    Ht 5\' 9"  (1.753 m)    Wt 162 lb (73.5 kg)    BMI 23.92 kg/m  Gen: awake, alert, NAD HEENT: anicteric  CV: RRR, no mrg Pulm: CTA b/l Abd: soft, NT/ND, +BS throughout Ext: no c/c/e Neuro: nonfocal     RELEVANT LABS AND IMAGING: CBC Labs (Brief)          Component Value Date/Time    WBC 8.8 06/23/2020 1232    RBC 4.70 06/23/2020 1232    HGB 14.8 06/23/2020 1232    HCT 43.2 06/23/2020 1232    PLT 370 06/23/2020 1232    MCV 91.9 06/23/2020 1232    MCH 31.5 06/23/2020 1232    MCHC 34.3 06/23/2020 1232    RDW 11.8 06/23/2020 1232    LYMPHSABS 1.0 07/12/2017 0758    MONOABS 0.7 07/12/2017 0758    EOSABS 0.3 07/12/2017 0758     BASOSABS 0.0 07/12/2017 0758        CMP     Labs (Brief)          Component Value Date/Time    NA 141 03/13/2021 0809    NA 142 09/03/2007 0000    K 4.3 03/13/2021 0809    K 4.2 09/03/2007 0000    CL 106 03/13/2021 0809    CO2 27 03/13/2021 0809    GLUCOSE 150 (H) 03/13/2021 0809    BUN 17 03/13/2021 0809    CREATININE 0.79 03/13/2021 0809    CREATININE 0.69 (L) 09/15/2015 0813    CALCIUM 9.4 03/13/2021 0809    PROT 6.3 03/13/2021 0809    ALBUMIN 4.2 03/13/2021 0809    AST 13 03/13/2021 0809    ALT 15 03/13/2021 0809    ALKPHOS 66 03/13/2021 0809  BILITOT 0.8 03/13/2021 0809    GFRNONAA >60 06/23/2020 1232    GFRAA >60 04/06/2020 1313        ASSESSMENT/PLAN: 76 year old male with a history of sessile serrated and adenomatous colon polyps, hypertension, hyperlipidemia, diabetes, osteopenia who is seen to discuss surveillance colonoscopy and constipation.    History of adenomatous and sessile serrated colon polyps --we reviewed the risk and benefits of surveillance colonoscopy at his current age of 63 years.  Medically he is doing well and he is not having concerning symptoms.  He is interested in surveillance colonoscopy and I think this is reasonable.  We reviewed the risk, benefits and alternatives and he agrees and wishes to proceed --Colonoscopy in the [place]   2.  Constipation --mild and without alarm symptoms.  I recommended MiraLAX --MiraLAX 17 g daily.  This dose can be titrated to effect as we discussed.

## 2021-07-27 NOTE — Patient Instructions (Signed)
Resume previous diet and medications. Awaiting pathology results. ? ?YOU HAD AN ENDOSCOPIC PROCEDURE TODAY AT THE [facility]:   Refer to the procedure report that was given to you for any specific questions about what was found during the examination.  If the procedure report does not answer your questions, please call your gastroenterologist to clarify.  If you requested that your care partner not be given the details of your procedure findings, then the procedure report has been included in a sealed envelope for you to review at your convenience later. ? ?YOU SHOULD EXPECT: Some feelings of bloating in the abdomen. Passage of more gas than usual.  Walking can help get rid of the air that was put into your GI tract during the procedure and reduce the bloating. If you had a lower endoscopy (such as a colonoscopy or flexible sigmoidoscopy) you may notice spotting of blood in your stool or on the toilet paper. If you underwent a bowel prep for your procedure, you may not have a normal bowel movement for a few days. ? ?Please Note:  You might notice some irritation and congestion in your nose or some drainage.  This is from the oxygen used during your procedure.  There is no need for concern and it should clear up in a day or so. ? ?SYMPTOMS TO REPORT IMMEDIATELY: ? ?Following lower endoscopy (colonoscopy or flexible sigmoidoscopy): ? Excessive amounts of blood in the stool ? Significant tenderness or worsening of abdominal pains ? Swelling of the abdomen that is new, acute ? Fever of 100?F or higher ? ?For urgent or emergent issues, a gastroenterologist can be reached at any hour by calling [phone]. ?Do not use MyChart messaging for urgent concerns.  ? ? ?DIET:  We do recommend a small meal at first, but then you may proceed to your regular diet.  Drink plenty of fluids but you should avoid alcoholic beverages for 24 hours. ? ?ACTIVITY:  You should plan to take it easy for the rest of today and  you should NOT DRIVE or use heavy machinery until tomorrow (because of the sedation medicines used during the test).   ? ?FOLLOW UP: ?Our staff will call the number listed on your records 48-72 hours following your procedure to check on you and address any questions or concerns that you may have regarding the information given to you following your procedure. If we do not reach you, we will leave a message.  We will attempt to reach you two times.  During this call, we will ask if you have developed any symptoms of COVID 19. If you develop any symptoms (ie: fever, flu-like symptoms, shortness of breath, cough etc.) before then, please call [phone].  If you test positive for Covid 19 in the 2 weeks post procedure, please call and report this information to us.   ? ?If any biopsies were taken you will be contacted by phone or by letter within the next 1-3 weeks.  Please call us at [phone] if you have not heard about the biopsies in 3 weeks.  ? ? ?SIGNATURES/CONFIDENTIALITY: ?You and/or your care partner have signed paperwork which will be entered into your electronic medical record.  These signatures attest to the fact that that the information above on your After Visit Summary has been reviewed and is understood.  Full responsibility of the confidentiality of this discharge information lies with you and/or your care-partner.  ?

## 2021-07-27 NOTE — Progress Notes (Signed)
PT taken to PACU. Monitors in place. VSS. Report given to RN. 

## 2021-07-27 NOTE — Op Note (Signed)
[facility] Patient Name: Norman Marks Procedure Date: 07/27/2021 10:51 AM MRN: [id number] Endoscopist: Docia Chuck. Henrene Pastor , MD Age: 76 Referring MD:  Date of Birth: September 05, 1945 Gender: Male Account #: [id number] Procedure:                Colonoscopy with cold snare polypectomy x 1 Indications:              High risk colon cancer surveillance: Personal                            history of non-advanced adenoma, High risk colon                            cancer surveillance: Personal history of sessile                            serrated colon polyp (less than 10 mm in size) with                            no dysplasia. Previous examinations 2014 (Dr.                            Deatra Ina); June 2019 (Dr. Hilarie Fredrickson) Medicines:                Monitored Anesthesia Care Procedure:                Pre-Anesthesia Assessment:                           - Prior to the procedure, a History and Physical                            was performed, and patient medications and                            allergies were reviewed. The patient's tolerance of                            previous anesthesia was also reviewed. The risks                            and benefits of the procedure and the sedation                            options and risks were discussed with the patient.                            All questions were answered, and informed consent                            was obtained. Prior Anticoagulants: The patient has                            taken no previous anticoagulant or antiplatelet  agents. ASA Grade Assessment: III - A patient with                            severe systemic disease. After reviewing the risks                            and benefits, the patient was deemed in                            satisfactory condition to undergo the procedure.                           After obtaining informed consent, the colonoscope                            was  passed under direct vision. Throughout the                            procedure, the patient's blood pressure, pulse, and                            oxygen saturations were monitored continuously. The                            CF [id number] #[id number] was introduced through the anus                            and advanced to the the cecum, identified by                            appendiceal orifice and ileocecal valve. The                            ileocecal valve, appendiceal orifice, and rectum                            were photographed. The quality of the bowel                            preparation was excellent. The colonoscopy was                            performed without difficulty. The patient tolerated                            the procedure well. The bowel preparation used was                            SUPREP via split dose instruction. Scope In: 11:12:29 AM Scope Out: 11:24:26 AM Scope Withdrawal Time: 0 hours 10 minutes 13 seconds  Total Procedure Duration: 0 hours 11 minutes 57 seconds  Findings:                 A 3 mm polyp was found in the  cecum. The polyp was                            removed with a cold snare. Resection and retrieval                            were complete.                           A few small-mouthed diverticula were found in the                            left colon.                           The exam was otherwise without abnormality on                            direct and retroflexion views. Complications:            No immediate complications. Estimated blood loss:                            None. Estimated Blood Loss:     Estimated blood loss: none. Impression:               - One 3 mm polyp in the cecum, removed with a cold                            snare. Resected and retrieved.                           - Diverticulosis in the left colon.                           - The examination was otherwise normal on direct                             and retroflexion views. Recommendation:           - Repeat colonoscopy is not recommended for                            surveillance.                           - Patient has a contact number available for                            emergencies. The signs and symptoms of potential                            delayed complications were discussed with the                            patient. Return to normal activities tomorrow.  Written discharge instructions were provided to the                            patient.                           - Resume previous diet.                           - Continue present medications.                           - Await pathology results. Docia Chuck. Henrene Pastor, MD 07/27/2021 11:31:02 AM This report has been signed electronically.

## 2021-07-27 NOTE — Progress Notes (Signed)
VS-CW 

## 2021-07-28 ENCOUNTER — Other Ambulatory Visit: Payer: Self-pay | Admitting: Family Medicine

## 2021-07-31 ENCOUNTER — Telehealth: Payer: Self-pay

## 2021-07-31 ENCOUNTER — Encounter: Payer: Self-pay | Admitting: Internal Medicine

## 2021-07-31 NOTE — Telephone Encounter (Signed)
°  Follow up Call-  Call back number 07/27/2021  Post procedure Call Back phone  # [phone]  Permission to leave phone message Yes  Some recent data might be hidden     Patient questions:  Do you have a fever, pain , or abdominal swelling? No. Pain Score  0 *  Have you tolerated food without any problems? Yes.    Have you been able to return to your normal activities? Yes.    Do you have any questions about your discharge instructions: Diet   No. Medications  No. Follow up visit  No.  Do you have questions or concerns about your Care? No.  Actions: * If pain score is 4 or above: No action needed, pain <4.  Have you developed a fever since your procedure? no  2.   Have you had an respiratory symptoms (SOB or cough) since your procedure? no  3.   Have you tested positive for COVID 19 since your procedure no  4.   Have you had any family members/close contacts diagnosed with the COVID 19 since your procedure?  no   If yes to any of these questions please route to Joylene John, RN and Joella Prince, RN

## 2021-08-03 ENCOUNTER — Encounter: Payer: Self-pay | Admitting: Family Medicine

## 2021-08-23 ENCOUNTER — Other Ambulatory Visit: Payer: Self-pay

## 2021-08-23 ENCOUNTER — Ambulatory Visit (INDEPENDENT_AMBULATORY_CARE_PROVIDER_SITE_OTHER): Payer: Medicare Other | Admitting: Dermatology

## 2021-08-23 DIAGNOSIS — C44319 Basal cell carcinoma of skin of other parts of face: Secondary | ICD-10-CM | POA: Diagnosis not present

## 2021-08-23 DIAGNOSIS — D0439 Carcinoma in situ of skin of other parts of face: Secondary | ICD-10-CM

## 2021-08-23 DIAGNOSIS — D485 Neoplasm of uncertain behavior of skin: Secondary | ICD-10-CM

## 2021-08-23 DIAGNOSIS — Z1283 Encounter for screening for malignant neoplasm of skin: Secondary | ICD-10-CM | POA: Diagnosis not present

## 2021-08-23 DIAGNOSIS — Z85828 Personal history of other malignant neoplasm of skin: Secondary | ICD-10-CM

## 2021-08-23 DIAGNOSIS — C44622 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, including shoulder: Secondary | ICD-10-CM | POA: Diagnosis not present

## 2021-08-23 DIAGNOSIS — C4492 Squamous cell carcinoma of skin, unspecified: Secondary | ICD-10-CM

## 2021-08-23 HISTORY — DX: Squamous cell carcinoma of skin, unspecified: C44.92

## 2021-08-23 NOTE — Patient Instructions (Signed)

## 2021-08-28 ENCOUNTER — Encounter: Payer: Self-pay | Admitting: *Deleted

## 2021-08-28 ENCOUNTER — Telehealth: Payer: Self-pay | Admitting: *Deleted

## 2021-08-28 NOTE — Telephone Encounter (Signed)
-----   Message from Lavonna Monarch, MD sent at 08/25/2021  4:06 PM EST ----- Please schedule patient is my last afternoon or morning surgery and will decide at that time which ones we will treat.

## 2021-08-28 NOTE — Telephone Encounter (Signed)
Path to patient. Made surgery appointment with Dr.Tafeen.  

## 2021-09-06 ENCOUNTER — Other Ambulatory Visit: Payer: Self-pay

## 2021-09-06 ENCOUNTER — Other Ambulatory Visit (INDEPENDENT_AMBULATORY_CARE_PROVIDER_SITE_OTHER): Payer: Medicare Other

## 2021-09-06 DIAGNOSIS — E785 Hyperlipidemia, unspecified: Secondary | ICD-10-CM | POA: Diagnosis not present

## 2021-09-07 LAB — HEPATIC FUNCTION PANEL
ALT: 17 IU/L (ref 0–44)
AST: 14 IU/L (ref 0–40)
Albumin: 4.4 g/dL (ref 3.7–4.7)
Alkaline Phosphatase: 70 IU/L (ref 44–121)
Bilirubin Total: 0.6 mg/dL (ref 0.0–1.2)
Bilirubin, Direct: 0.23 mg/dL (ref 0.00–0.40)
Total Protein: 6.3 g/dL (ref 6.0–8.5)

## 2021-09-07 LAB — LIPID PANEL
Chol/HDL Ratio: 2.5 ratio (ref 0.0–5.0)
Cholesterol, Total: 83 mg/dL — ABNORMAL LOW (ref 100–199)
HDL: 33 mg/dL — ABNORMAL LOW (ref >39)
LDL Chol Calc (NIH): 35 mg/dL (ref 0–99)
Triglycerides: 64 mg/dL (ref 0–149)
VLDL Cholesterol Cal: 15 mg/dL (ref 5–40)

## 2021-09-11 ENCOUNTER — Encounter: Payer: Self-pay | Admitting: Dermatology

## 2021-09-11 NOTE — Progress Notes (Signed)
° °  Follow-Up Visit   Subjective  ADITHYA DIFRANCESCO is a 76 y.o. male who presents for the following: Annual Exam (Here for skin exam. No concerns per patient. History of non mole skin cancers. ).  Annual skin examination, history of dozens of nonmelanoma skin cancers. Location:  Duration:  Quality:  Associated Signs/Symptoms: Modifying Factors:  Severity:  Timing: Context:   Objective  Well appearing patient in no apparent distress; mood and affect are within normal limits. Waist up exam: No atypical pigmented lesions or recurrent nonmelanoma skin cancer.  3 probable new nonmelanoma skin cancers will be biopsied today.  Left Malar Cheek Subtle waxy 1 cm pink crust, rule out superficial carcinoma       Right Posterior Mandible Pearly 8 mm papule, BCC       Right Dorsal Hand Thick pink-white crust, superficial SCCA         All skin waist up examined.   Assessment & Plan    Screening for malignant neoplasm of skin  Yearly skin exams.  Neoplasm of uncertain behavior of skin (3) Left Malar Cheek  Skin / nail biopsy Type of biopsy: tangential   Informed consent: discussed and consent obtained   Timeout: patient name, date of birth, surgical site, and procedure verified   Anesthesia: the lesion was anesthetized in a standard fashion   Anesthetic:  1% lidocaine w/ epinephrine 1-100,000 local infiltration Instrument used: flexible razor blade   Hemostasis achieved with: ferric subsulfate   Outcome: patient tolerated procedure well   Post-procedure details: wound care instructions given    Specimen 1 - Surgical pathology Differential Diagnosis: scc vs bcc  Check Margins: No  Right Posterior Mandible  Skin / nail biopsy Type of biopsy: tangential   Informed consent: discussed and consent obtained   Timeout: patient name, date of birth, surgical site, and procedure verified   Anesthesia: the lesion was anesthetized in a standard fashion    Anesthetic:  1% lidocaine w/ epinephrine 1-100,000 local infiltration Instrument used: flexible razor blade   Hemostasis achieved with: ferric subsulfate   Outcome: patient tolerated procedure well   Post-procedure details: wound care instructions given    Specimen 2 - Surgical pathology Differential Diagnosis: scc vs bcc  Check Margins: No  Right Dorsal Hand  Skin / nail biopsy Type of biopsy: tangential   Informed consent: discussed and consent obtained   Timeout: patient name, date of birth, surgical site, and procedure verified   Anesthesia: the lesion was anesthetized in a standard fashion   Anesthetic:  1% lidocaine w/ epinephrine 1-100,000 local infiltration Instrument used: flexible razor blade   Hemostasis achieved with: ferric subsulfate   Outcome: patient tolerated procedure well   Post-procedure details: wound care instructions given    Specimen 3 - Surgical pathology Differential Diagnosis: scc vs bcc  Check Margins: No      I, Lavonna Monarch, MD, have reviewed all documentation for this visit.  The documentation on 09/11/21 for the exam, diagnosis, procedures, and orders are all accurate and complete.

## 2021-09-18 ENCOUNTER — Encounter: Payer: Self-pay | Admitting: Family Medicine

## 2021-09-18 ENCOUNTER — Ambulatory Visit (INDEPENDENT_AMBULATORY_CARE_PROVIDER_SITE_OTHER): Payer: Medicare Other | Admitting: Family Medicine

## 2021-09-18 ENCOUNTER — Other Ambulatory Visit: Payer: Self-pay

## 2021-09-18 VITALS — BP 140/78 | HR 86 | Temp 97.5°F | Ht 69.0 in | Wt 149.5 lb

## 2021-09-18 DIAGNOSIS — E118 Type 2 diabetes mellitus with unspecified complications: Secondary | ICD-10-CM

## 2021-09-18 DIAGNOSIS — Z794 Long term (current) use of insulin: Secondary | ICD-10-CM | POA: Diagnosis not present

## 2021-09-18 DIAGNOSIS — I10 Essential (primary) hypertension: Secondary | ICD-10-CM | POA: Diagnosis not present

## 2021-09-18 DIAGNOSIS — I25118 Atherosclerotic heart disease of native coronary artery with other forms of angina pectoris: Secondary | ICD-10-CM

## 2021-09-18 DIAGNOSIS — E785 Hyperlipidemia, unspecified: Secondary | ICD-10-CM

## 2021-09-18 DIAGNOSIS — E1169 Type 2 diabetes mellitus with other specified complication: Secondary | ICD-10-CM

## 2021-09-18 LAB — POCT GLYCOSYLATED HEMOGLOBIN (HGB A1C): Hemoglobin A1C: 6.7 % — AB (ref 4.0–5.6)

## 2021-09-18 NOTE — Assessment & Plan Note (Addendum)
Chronic, adequate on current regimen of carvedilol and ramipril. Did not tolerate imdur due to HA/dizziness.  ?

## 2021-09-18 NOTE — Patient Instructions (Signed)
You are doing well today with A1c in controlled diabetes range!  ?Continue current medicines. ?Return in 6 months for physical.  ?

## 2021-09-18 NOTE — Progress Notes (Signed)
? ? Patient ID: Norman Marks, male    DOB: 12-Oct-1945, 76 y.o.   MRN: [id number] ? ?This visit was conducted in person. ? ?BP 140/78   Pulse 86   Temp (!) 97.5 ?F (36.4 ?C) (Temporal)   Ht '5\' 9"'$  (1.753 m)   Wt 149 lb 8 oz (67.8 kg)   SpO2 99%   BMI 22.08 kg/m?   ? ?CC: 6 mo DM f/u visit  ?Subjective:  ? ?HPI: ?Norman Marks is a 76 y.o. male presenting on 09/18/2021 for Diabetes (Here for 6 mo f/u.) ? ? ?Upcoming dental work - tooth extractions.  ?Colonoscopy 07/2021 - 1 TA, diverticulosis ([facility]). Told no need for f/u.  ?Had heart catheterization 06/2021 [facility]) - CAD with stable angina, did not tolerate imdur. Lovastatin changed to rosuvastatin '20mg'$  with goal LDL <55.  ? ?DM - does regularly check fasting sugars daily - 70-120s, this morning 150s (after cake last night). Compliant with antihyperglycemic regimen which includes: metformin XR '1000mg'$  daily with breakfast, lantus 38u QHS, ozempic 0.'5mg'$  weekly (Sundays). Ozempic started 03/2021. Tolerating well with some constipation. Denies low sugars or hypoglycemic symptoms. Denies paresthesias, blurry vision. Last diabetic eye exam 05/2021. Glucometer brand: one touch ultra. Last foot exam: 08/2020 - DUE. DSME: 09/2015.  ?Lab Results  ?Component Value Date  ? HGBA1C 6.7 (A) 09/18/2021  ? ?Diabetic Foot Exam - Simple   ?Simple Foot Form ?Diabetic Foot exam was performed with the following findings: Yes 09/18/2021  8:15 AM  ?Visual Inspection ?No deformities, no ulcerations, no other skin breakdown bilaterally: Yes ?Sensation Testing ?Intact to touch and monofilament testing bilaterally: Yes ?Pulse Check ?Posterior Tibialis and Dorsalis pulse intact bilaterally: Yes ?Comments ?  ? ?Lab Results  ?Component Value Date  ? MICROALBUR 1.6 09/15/2015  ?  ? ?   ? ?Relevant past medical, surgical, family and social history reviewed and updated as indicated. Interim medical history since our last visit reviewed. ?Allergies and medications reviewed and  updated. ?Outpatient Medications Prior to Visit  ?Medication Sig Dispense Refill  ? aspirin 81 MG tablet Take 81 mg by mouth daily.    ? carvedilol (COREG) 25 MG tablet Take 1 tablet (25 mg total) by mouth 2 (two) times daily with a meal. 180 tablet 3  ? Cholecalciferol (VITAMIN D3) 25 MCG (1000 UT) CAPS Take 1 capsule (1,000 Units total) by mouth daily. 30 capsule   ? imiquimod (ALDARA) 5 % cream Apply topically at bedtime. 12 each 0  ? Insulin Pen Needle (NOVOFINE PLUS PEN NEEDLE) 32G X 4 MM MISC Use to administer Ozempic once a week. 30 each 0  ? metFORMIN (GLUCOPHAGE XR) 500 MG 24 hr tablet Take 2 tablets (1,000 mg total) by mouth daily with breakfast. 180 tablet 3  ? ONETOUCH ULTRA test strip CHECK BLOOD SUGAR ONCE OR TWICE DAILY ASDIRECTED 100 each 3  ? OZEMPIC, 0.25 OR 0.5 MG/DOSE, 2 MG/1.5ML SOPN Inject 0.5 mg into the skin once a week.    ? polyethylene glycol (MIRALAX / GLYCOLAX) 17 g packet Take 17 g by mouth every other day.    ? ramipril (ALTACE) 10 MG capsule TAKE 1 CAPSULE BY MOUTH ONCE DAILY 90 capsule 1  ? rosuvastatin (CRESTOR) 20 MG tablet Take 1 tablet (20 mg total) by mouth daily. 90 tablet 3  ? insulin glargine (LANTUS) 100 UNIT/ML injection INJECT 0.45 ML (45 UNITS TOTAL) INTO THESKIN AT BEDTIME 10 mL 3  ? insulin glargine (LANTUS) 100 UNIT/ML injection INJECT 38 UNITS  INTO THESKIN AT BEDTIME 10 mL 3  ? meclizine (ANTIVERT) 25 MG tablet Take 1 tablet (25 mg total) by mouth 2 (two) times daily as needed for up to 20 doses for dizziness or nausea. 20 tablet 0  ? ondansetron (ZOFRAN) 4 MG tablet Take 1 tablet (4 mg total) by mouth every 8 (eight) hours as needed for up to 20 doses for nausea or vomiting. 20 tablet 0  ? ?No facility-administered medications prior to visit.  ?  ? ?Per HPI unless specifically indicated in ROS section below ?Review of Systems ? ?Objective:  ?BP 140/78   Pulse 86   Temp (!) 97.5 ?F (36.4 ?C) (Temporal)   Ht '5\' 9"'$  (1.753 m)   Wt 149 lb 8 oz (67.8 kg)   SpO2 99%    BMI 22.08 kg/m?   ?Wt Readings from Last 3 Encounters:  ?09/18/21 149 lb 8 oz (67.8 kg)  ?07/27/21 162 lb (73.5 kg)  ?07/05/21 154 lb 2 oz (69.9 kg)  ?  ?  ?Physical Exam ?Vitals and nursing note reviewed.  ?Constitutional:   ?   Appearance: Normal appearance. He is not ill-appearing.  ?Eyes:  ?   Extraocular Movements: Extraocular movements intact.  ?   Conjunctiva/sclera: Conjunctivae normal.  ?   Pupils: Pupils are equal, round, and reactive to light.  ?Cardiovascular:  ?   Rate and Rhythm: Normal rate and regular rhythm.  ?   Pulses: Normal pulses.  ?   Heart sounds: Normal heart sounds. No murmur heard. ?Pulmonary:  ?   Effort: Pulmonary effort is normal. No respiratory distress.  ?   Breath sounds: Normal breath sounds. No wheezing, rhonchi or rales.  ?Musculoskeletal:  ?   Right lower leg: No edema.  ?   Left lower leg: No edema.  ?   Comments: See HPI for foot exam if done  ?Skin: ?   General: Skin is warm and dry.  ?   Findings: No rash.  ?Neurological:  ?   Mental Status: He is alert.  ?Psychiatric:     ?   Mood and Affect: Mood normal.     ?   Behavior: Behavior normal.  ? ?   ?Results for orders placed or performed in visit on 09/18/21  ?POCT glycosylated hemoglobin (Hb A1C)  ?Result Value Ref Range  ? Hemoglobin A1C 6.7 (A) 4.0 - 5.6 %  ? HbA1c POC (<> result, manual entry)    ? HbA1c, POC (prediabetic range)    ? HbA1c, POC (controlled diabetic range)    ? ? ?Assessment & Plan:  ?This visit occurred during the SARS-CoV-2 public health emergency.  Safety protocols were in place, including screening questions prior to the visit, additional usage of staff PPE, and extensive cleaning of exam room while observing appropriate contact time as indicated for disinfecting solutions.  ? ?Problem List Items Addressed This Visit   ? ? Diabetes mellitus type 2, controlled, with complications ([place]) - Primary  ?  Chronic, improved control with addition of GLP1RA - continue current regimen. RTC 6 mo CPE/AMW ?  ?  ?  Relevant Medications  ? insulin glargine (LANTUS) 100 UNIT/ML injection  ? Other Relevant Orders  ? POCT glycosylated hemoglobin (Hb A1C) (Completed)  ? HTN (hypertension)  ?  Chronic, adequate on current regimen of carvedilol and ramipril. Did not tolerate imdur due to HA/dizziness.  ?  ?  ? Hyperlipidemia associated with type 2 diabetes mellitus ([place])  ?  Chronic, now on rosuvastatin (started  06/2021) with latest LDL 35. This is followed by cardiology.  ?  ?  ? Relevant Medications  ? insulin glargine (LANTUS) 100 UNIT/ML injection  ? CAD (coronary artery disease)  ?  Appreciate cardiology care.  ?  ?  ?  ? ?No orders of the defined types were placed in this encounter. ? ?Orders Placed This Encounter  ?Procedures  ? POCT glycosylated hemoglobin (Hb A1C)  ? ? ? ?Patient Instructions  ?You are doing well today with A1c in controlled diabetes range!  ?Continue current medicines. ?Return in 6 months for physical.  ? ?Follow up plan: ?Return in about 6 months (around 03/21/2022) for annual exam, prior fasting for blood work, medicare wellness visit. ? ?Ria Bush, MD   ?

## 2021-09-18 NOTE — Assessment & Plan Note (Signed)
Chronic, improved control with addition of GLP1RA - continue current regimen. RTC 6 mo CPE/AMW ?

## 2021-09-18 NOTE — Assessment & Plan Note (Addendum)
Chronic, now on rosuvastatin (started 06/2021) with latest LDL 35. This is followed by cardiology.  ?

## 2021-09-18 NOTE — Assessment & Plan Note (Signed)
Appreciate cardiology care.  °

## 2021-10-05 ENCOUNTER — Encounter: Payer: Medicare Other | Admitting: Dermatology

## 2021-10-30 ENCOUNTER — Other Ambulatory Visit: Payer: Self-pay | Admitting: Family Medicine

## 2021-11-08 ENCOUNTER — Encounter: Payer: Self-pay | Admitting: Cardiovascular Disease

## 2021-11-08 ENCOUNTER — Ambulatory Visit (INDEPENDENT_AMBULATORY_CARE_PROVIDER_SITE_OTHER): Payer: Medicare Other | Admitting: Cardiovascular Disease

## 2021-11-08 VITALS — BP 110/64 | HR 84 | Ht 69.0 in | Wt 149.1 lb

## 2021-11-08 DIAGNOSIS — I25118 Atherosclerotic heart disease of native coronary artery with other forms of angina pectoris: Secondary | ICD-10-CM

## 2021-11-08 DIAGNOSIS — I10 Essential (primary) hypertension: Secondary | ICD-10-CM

## 2021-11-08 DIAGNOSIS — E785 Hyperlipidemia, unspecified: Secondary | ICD-10-CM | POA: Diagnosis not present

## 2021-11-08 DIAGNOSIS — I447 Left bundle-branch block, unspecified: Secondary | ICD-10-CM

## 2021-11-08 NOTE — Progress Notes (Signed)
?  ?Cardiology Office Note ? ? ?Date:  11/08/2021  ? ?ID:  Norman Marks, DOB Apr 11, 1946, MRN [id number] ? ?PCP:  Ria Bush, MD  ?Cardiologist:   Kathlyn Sacramento, MD  ? ?Chief Complaint  ?Patient presents with  ? Other  ?  4 month f/u no complaints today. Meds reviewed verbally with pt.  ? ? ?  ?History of Present Illness: ?Norman Marks is a 76 y.o. male who presents for a follow-up visit regarding coronary artery disease and left bundle branch block.  ?He had cardiac catheterization in 2009 which showed one-vessel coronary artery disease involving the proximal ramus (70% stenosis). He is being treated medically. ?Most recent nuclear stress test in June 2015 showed a fixed anteroseptal and inferoseptal defects with normal ejection fraction. The defects were felt to be due to left bundle branch block. ?He has known history of hypertension, diabetes and hyperlipidemia.  ? ?He had an echocardiogram done in January 2017 which showed an ejection fraction of 01-09%, grade 1 diastolic dysfunction, mildly dilated left atrium, mild mitral regurgitation and mild pulmonary hypertension. ? ?He was seen recently for increased angina.  He had an echocardiogram done which showed an EF of 50 to 55% with no significant valvular abnormalities.  Cardiac catheterization was done in December  which showed 70% mid LAD stenosis with diffuse 40% disease extending all the way to the ostium, 60% proximal ramus stenosis and no other obstructive disease.  LVEDP was mildly elevated.  I added Imdur 30 mg but he did not tolerate the medication due to headache and dizziness. ? ?He has been doing very well with no chest pain, shortness of breath or palpitations.  His glycemic control improved with addition of Ozempic.  He stays active. ? ? ? ?Past Medical History:  ?Diagnosis Date  ? Basal cell carcinoma 02/10/2020  ? SEE MEDIA 8 PAGES OF PREVIOUS BCC  ? Basal cell carcinoma 08/23/2021  ? left malar cheek  ? BCC (basal cell carcinoma)  03/22/2021  ? sup- left chst (CX35FU)  ? BPH (benign prostatic hypertrophy)   ? CAD (coronary artery disease) 2009  ? [name] (cardiolite 2009 with 70% stenosis of proximal ramus, not amenable to PCI, rec treat medically) --> Felisha Claytor  ? Cancer [facility])   ? basal cell skin cancer  ? Cataract   ? removed bilaterally  ? DDD (degenerative disc disease), lumbar   ? s/p surgery  ? Diabetes type 2, uncontrolled 1990s  ? referred to DSME 09/2015  ? Diverticulosis   ? Headache(784.0)   ? History of adenomatous polyp of colon 10/2012  ? History of nephrolithiasis 2012  ? L ureter, R renal pelvis  ? HLD (hyperlipidemia)   ? HTN (hypertension)   ? mild  ? Hx of migraines   ? Left bundle branch block   ? Osteoarthritis of [place] joint of thumb 2014  ? s/p injections ([place])  ? Osteoarthritis of right shoulder 2015  ? s/p injections ([place])  ? Osteopenia 06/2011  ? T score -3, completed 2 yrs forteo, T score improved to -2.2 (2015)  ? 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 in situ (CX35FU)  ? SCC (squamous cell carcinoma) 08/23/2021  ? right posterior mandible-CIS  ? SCC (squamous cell carcinoma) 08/23/2021  ? right dorsal hand  ? 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 SEE MEDIA 8 PAGES OF PREVIOUS SCC AND TREATMENTS  ? Vitamin D deficiency   ? Wears hearing aid in both  ears   ? ? ?Past Surgical History:  ?Procedure Laterality Date  ? [place], 2008  ? lumbar; x 3, spinal fusion  ? CARDIAC CATHETERIZATION  2009  ? 70% stenosis ostium  ? CARDIOVASCULAR STRESS TEST  2011  ? fixed defect inferior/septal walls without reversibility, LVEF 43% post stress  ? CATARACT EXTRACTION W/PHACO Right 03/09/2019  ? Procedure: CATARACT EXTRACTION PHACO AND INTRAOCULAR LENS PLACEMENT ([place]) RIGHT DIABETES;  Surgeon: Eulogio Bear, MD;  Location: [place];  Service: Ophthalmology;  Laterality: Right;  Diabetic - insulin and oral meds  ? CATARACT EXTRACTION W/PHACO Left 04/06/2019  ? Procedure:  CATARACT EXTRACTION PHACO AND INTRAOCULAR LENS PLACEMENT ([facility]) LEFT DIABETIC 01:04.2     11.5%      8.58;  Surgeon: Eulogio Bear, MD;  Location: [place];  Service: Ophthalmology;  Laterality: Left;  Diabetic - insulin and oral meds  ? COLONOSCOPY  10/2012  ? tubular adenoma, mild diverticulosis, rpt 5 yrs Deatra Ina)  ? COLONOSCOPY  12/2017  ? 4 polyps - SSP, TA, diverticulosis ([place])  ? COLONOSCOPY  07/2021  ? TA, diverticulosis, rpt 3 yrs vs none Henrene Pastor)  ? dexa  06/2011  ? osteoporosis T -2.6 spine, -1.9 hip  ? dexa  07/2013  ? osteopenia T -2.2 femur, -1.2 spine  ? FLEXIBLE SIGMOIDOSCOPY  2007  ? pt reported normal ([place])  ? FOOT SURGERY Left 08/2017  ? HAND SURGERY Left 10/2015  ? [place] arthritis repair Grandville Silos)  ? KNEE SURGERY Right   ? LEFT HEART CATH AND CORONARY ANGIOGRAPHY N/A 06/26/2021  ? Procedure: LEFT HEART CATH AND CORONARY ANGIOGRAPHY;  Surgeon: Wellington Hampshire, MD;  Location: [place] [facility];  Service: Cardiovascular;  Laterality: N/A;  ? POLYPECTOMY    ? SHOULDER SURGERY Right   ? SKIN SURGERY  2006  ? skin cancer  ? TENDON REPAIR  08/2017  ? TOTAL SHOULDER ARTHROPLASTY Right 01/26/2014  ? Procedure: TOTAL SHOULDER ARTHROPLASTY;  Surgeon: Johnny Bridge, MD;  Location: [place];  Service: Orthopedics;  Laterality: Right;  ? US ECHOCARDIOGRAPHY  10/2008  ? nl LV fxn, EF 50%, mild MR, mild pulm HTN  ? ? ? ?Current Outpatient Medications  ?Medication Sig Dispense Refill  ? aspirin 81 MG tablet Take 81 mg by mouth daily.    ? carvedilol (COREG) 25 MG tablet Take 1 tablet (25 mg total) by mouth 2 (two) times daily with a meal. 180 tablet 3  ? Cholecalciferol (VITAMIN D3) 25 MCG (1000 UT) CAPS Take 1 capsule (1,000 Units total) by mouth daily. 30 capsule   ? imiquimod (ALDARA) 5 % cream Apply topically at bedtime. 12 each 0  ? insulin glargine (LANTUS) 100 UNIT/ML injection INJECT 28 UNITS INTO THESKIN AT BEDTIME 10 mL 3  ? Insulin Pen Needle (NOVOFINE PLUS PEN NEEDLE) 32G X 4  MM MISC Use to administer Ozempic once a week. 30 each 0  ? metFORMIN (GLUCOPHAGE XR) 500 MG 24 hr tablet Take 2 tablets (1,000 mg total) by mouth daily with breakfast. 180 tablet 3  ? ONETOUCH ULTRA test strip CHECK BLOOD SUGAR ONCE OR TWICE DAILY ASDIRECTED 100 each 3  ? OZEMPIC, 0.25 OR 0.5 MG/DOSE, 2 MG/1.5ML SOPN Inject 0.5 mg into the skin once a week.    ? polyethylene glycol (MIRALAX / GLYCOLAX) 17 g packet Take 17 g by mouth every other day.    ? ramipril (ALTACE) 10 MG capsule TAKE 1 CAPSULE BY MOUTH ONCE DAILY  90 capsule 1  ? rosuvastatin (CRESTOR) 20 MG tablet Take 1 tablet (20 mg total) by mouth daily. 90 tablet 3  ? ?No current facility-administered medications for this visit.  ? ? ?Allergies:   Metformin and related and Percocet [oxycodone-acetaminophen]  ? ? ?Social History:  The patient  reports that he quit smoking about 29 years ago. His smoking use included cigarettes. He has a 2.50 pack-year smoking history. He quit smokeless tobacco use about 29 years ago.  His smokeless tobacco use included chew. He reports that he does not drink alcohol and does not use drugs.  ? ?Family History:  The patient's family history includes Cancer in his father and mother; Coronary artery disease (age of onset: 50) in his sister; Diabetes in his brother and father; Heart attack (age of onset: 16) in his sister; Heart disease in his mother.  ? ? ?ROS:  Please see the history of present illness.   Otherwise, review of systems are positive for none.   All other systems are reviewed and negative.  ? ? ?PHYSICAL EXAM: ?VS:  BP 110/64 (BP Location: Left Arm, Patient Position: Sitting, Cuff Size: Normal)   Pulse 84   Ht '5\' 9"'$  (1.753 m)   Wt 149 lb 2 oz (67.6 kg)   SpO2 97%   BMI 22.02 kg/m?  , BMI Body mass index is 22.02 kg/m?. ?GEN: Well nourished, well developed, in no acute distress  ?HEENT: normal  ?Neck: no JVD, carotid bruits, or masses ?Cardiac: RRR; no murmurs, rubs, or gallops,no edema  ?Respiratory:  clear  to auscultation bilaterally, normal work of breathing ?GI: soft, nontender, nondistended, + BS ?MS: no deformity or atrophy  ?Skin: warm and dry, no rash ?Neuro:  Strength and sensation are intact ?Psych: eut

## 2021-11-08 NOTE — Patient Instructions (Signed)

## 2021-11-09 ENCOUNTER — Ambulatory Visit (INDEPENDENT_AMBULATORY_CARE_PROVIDER_SITE_OTHER): Payer: Medicare Other | Admitting: Dermatology

## 2021-11-09 ENCOUNTER — Encounter: Payer: Self-pay | Admitting: Dermatology

## 2021-11-09 DIAGNOSIS — C44622 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, including shoulder: Secondary | ICD-10-CM

## 2021-11-09 DIAGNOSIS — C44329 Squamous cell carcinoma of skin of other parts of face: Secondary | ICD-10-CM

## 2021-11-09 DIAGNOSIS — C44320 Squamous cell carcinoma of skin of unspecified parts of face: Secondary | ICD-10-CM

## 2021-11-09 NOTE — Patient Instructions (Signed)

## 2021-11-27 ENCOUNTER — Encounter: Payer: Self-pay | Admitting: Dermatology

## 2021-11-27 NOTE — Progress Notes (Signed)
? ?  Follow-Up Visit ?  ?Subjective  ?Norman Marks is a 76 y.o. male who presents for the following: Procedure (Patient here today for treatment of BCC x 1 left malar cheek, CIS x 1 right posterior mandible and SCC x 1 right dorsal hand. ). ? ?Multiple biopsy-proven nonmelanoma skin cancers, will begin with spots on right hand and right cheek. ?Location:  ?Duration:  ?Quality:  ?Associated Signs/Symptoms: ?Modifying Factors:  ?Severity:  ?Timing: ?Context:  ? ?Objective  ?Well appearing patient in no apparent distress; mood and affect are within normal limits. ?Right Posterior Mandible ?Lesion identified by Dr.Nickisha Hum and nurse in room.   ? ?Right Dorsal Hand ?Lesion identified by Dr.Rhonda Linan and nurse in room and the patient. ? ? ? ?A focused examination was performed including head, neck, arms, hands. Relevant physical exam findings are noted in the Assessment and Plan. ? ? ?Assessment & Plan  ? ? 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 ?Destruction of lesion ?Complexity: simple   ?Destruction method: electrodesiccation and curettage   ?Informed consent: discussed and consent obtained   ?Timeout:  patient name, date of birth, surgical site, and procedure verified ?Anesthesia: the lesion was anesthetized in a standard fashion   ?Anesthetic:  1% lidocaine w/ epinephrine 1-100,000 local infiltration ?Curettage performed in three different directions: Yes   ?Electrodesiccation performed over the curetted area: Yes   ?Curettage cycles:  3 ?Lesion length (cm):  2 ?Lesion width (cm):  2 ?Margin per side (cm):  0 ?Final wound size (cm):  2 ?Hemostasis achieved with:  ferric subsulfate ?Outcome: patient tolerated procedure well with no complications   ?Additional details:  Wound innoculated with 5 fluorouracil solution. ? ?Right Dorsal Hand ? ?Destruction of lesion ?Complexity: simple   ?Destruction method: electrodesiccation and curettage   ?Informed consent: discussed and consent obtained    ?Timeout:  patient name, date of birth, surgical site, and procedure verified ?Anesthesia: the lesion was anesthetized in a standard fashion   ?Anesthetic:  1% lidocaine w/ epinephrine 1-100,000 local infiltration ?Curettage performed in three different directions: Yes   ?Curettage cycles:  3 ?Lesion length (cm):  1.7 ?Lesion width (cm):  1.7 ?Margin per side (cm):  0 ?Final wound size (cm):  1.7 ?Hemostasis achieved with:  ferric subsulfate ?Outcome: patient tolerated procedure well with no complications   ?Additional details:  Wound innoculated with 5 fluorouracil solution. ? ?Next surgical appointment bcc left malar cheek  ? ? ? ? ? ?I, Lavonna Monarch, MD, have reviewed all documentation for this visit.  The documentation on 11/27/21 for the exam, diagnosis, procedures, and orders are all accurate and complete. ?

## 2021-12-05 ENCOUNTER — Encounter: Payer: Medicare Other | Admitting: Dermatology

## 2021-12-12 ENCOUNTER — Other Ambulatory Visit: Payer: Self-pay | Admitting: Family Medicine

## 2021-12-27 ENCOUNTER — Other Ambulatory Visit: Payer: Self-pay | Admitting: Family Medicine

## 2021-12-27 DIAGNOSIS — Z794 Long term (current) use of insulin: Secondary | ICD-10-CM

## 2021-12-28 NOTE — Telephone Encounter (Signed)
Spoke with pt asking what dose he is taking.  States he is doing 0.5 mg wkly.   Ozempic 0.5 mg Last filled:  12/27/21 Last OV:  09/18/21, 6 mo DM f/u Next OV:  03/21/22, CPE

## 2021-12-29 NOTE — Telephone Encounter (Signed)
Pharmacy called in asking for an update. Advised we were waiting on Dr.G to respond.

## 2021-12-29 NOTE — Telephone Encounter (Signed)
ERx 

## 2022-01-15 ENCOUNTER — Other Ambulatory Visit: Payer: Self-pay | Admitting: Family Medicine

## 2022-02-01 ENCOUNTER — Encounter: Payer: Self-pay | Admitting: Dermatology

## 2022-02-01 ENCOUNTER — Ambulatory Visit (INDEPENDENT_AMBULATORY_CARE_PROVIDER_SITE_OTHER): Payer: Medicare Other | Admitting: Dermatology

## 2022-02-01 DIAGNOSIS — C44319 Basal cell carcinoma of skin of other parts of face: Secondary | ICD-10-CM | POA: Diagnosis not present

## 2022-02-01 DIAGNOSIS — C4491 Basal cell carcinoma of skin, unspecified: Secondary | ICD-10-CM

## 2022-02-01 NOTE — Patient Instructions (Signed)

## 2022-02-08 ENCOUNTER — Ambulatory Visit (INDEPENDENT_AMBULATORY_CARE_PROVIDER_SITE_OTHER): Payer: Medicare Other | Admitting: *Deleted

## 2022-02-08 DIAGNOSIS — Z4802 Encounter for removal of sutures: Secondary | ICD-10-CM

## 2022-02-08 NOTE — Progress Notes (Signed)
Here for NTS suture removal. No signs or symptoms of infection. Path to patient. 

## 2022-02-24 ENCOUNTER — Encounter: Payer: Self-pay | Admitting: Dermatology

## 2022-02-24 NOTE — Progress Notes (Signed)
   Follow-Up Visit   Subjective  Norman Marks is a 76 y.o. male who presents for the following: Procedure (Patient here today for treatment of BCC x 1 on left malar cheek. ).  For treatment of biopsy-proven BCC left cheek Location:  Duration:  Quality:  Associated Signs/Symptoms: Modifying Factors:  Severity:  Timing: Context:   Objective  Well appearing patient in no apparent distress; mood and affect are within normal limits. Left Malar Cheek Lesion identified by Dr.Chlora Mcbain and nurse in room.      A focused examination was performed including head and neck. Relevant physical exam findings are noted in the Assessment and Plan.   Assessment & Plan    Nodular basal cell carcinoma Left Malar Cheek  Destruction of lesion Complexity: simple   Destruction method: electrodesiccation and curettage   Informed consent: discussed and consent obtained   Timeout:  patient name, date of birth, surgical site, and procedure verified Anesthesia: the lesion was anesthetized in a standard fashion   Anesthetic:  1% lidocaine w/ epinephrine 1-100,000 local infiltration Curettage performed in three different directions: Yes   Electrodesiccation performed over the curetted area: Yes   Curettage cycles:  3 Lesion length (cm):  1.7 Lesion width (cm):  1.7 Margin per side (cm):  0 Final wound size (cm):  1.7 Hemostasis achieved with:  aluminum chloride Outcome: patient tolerated procedure well with no complications   Post-procedure details: wound care instructions given    Skin excision  Lesion length (cm):  2 Lesion width (cm):  2 Margin per side (cm):  0.1 Total excision diameter (cm):  2.2 Informed consent: discussed and consent obtained   Timeout: patient name, date of birth, surgical site, and procedure verified   Anesthesia: the lesion was anesthetized in a standard fashion   Anesthetic:  1% lidocaine w/ epinephrine 1-100,000 local infiltration Instrument used: #15 blade    Hemostasis achieved with: pressure and electrodesiccation   Outcome: patient tolerated procedure well with no complications   Post-procedure details: sterile dressing applied and wound care instructions given   Dressing type: bandage, petrolatum and pressure dressing   Additional details:  5-0 ethilon x 5 5-0 vicryl x 3 Nurse visit 7-10 days   Skin repair Complexity:  Intermediate Final length (cm):  2.5 Informed consent: discussed and consent obtained   Reason for type of repair: reduce tension to allow closure, reduce the risk of dehiscence, infection, and necrosis and reduce subcutaneous dead space and avoid a hematoma   Subcutaneous layers (deep stitches):  Suture size:  5-0 Suture type: Vicryl (polyglactin 910)   Fine/surface layer approximation (top stitches):  Suture size:  5-0 Suture type: nylon   Suture removal (days):  7 Hemostasis achieved with: suture Outcome: patient tolerated procedure well with no complications   Post-procedure details: sterile dressing applied   Dressing type: petrolatum    Specimen 1 - Surgical pathology Differential Diagnosis: bcc nod  Check Margins: yes medial margin stained   Curettage showed this to be a deep lesion, base and margins cauterized, recuretted followed by narrow margin excision layered closure.      I, Lavonna Monarch, MD, have reviewed all documentation for this visit.  The documentation on 02/24/22 for the exam, diagnosis, procedures, and orders are all accurate and complete.

## 2022-03-10 ENCOUNTER — Other Ambulatory Visit: Payer: Self-pay | Admitting: Family Medicine

## 2022-03-10 DIAGNOSIS — E118 Type 2 diabetes mellitus with unspecified complications: Secondary | ICD-10-CM

## 2022-03-10 DIAGNOSIS — N138 Other obstructive and reflux uropathy: Secondary | ICD-10-CM

## 2022-03-10 DIAGNOSIS — E559 Vitamin D deficiency, unspecified: Secondary | ICD-10-CM

## 2022-03-10 DIAGNOSIS — E1169 Type 2 diabetes mellitus with other specified complication: Secondary | ICD-10-CM

## 2022-03-14 ENCOUNTER — Other Ambulatory Visit (INDEPENDENT_AMBULATORY_CARE_PROVIDER_SITE_OTHER): Payer: Medicare Other

## 2022-03-14 DIAGNOSIS — E559 Vitamin D deficiency, unspecified: Secondary | ICD-10-CM

## 2022-03-14 DIAGNOSIS — E118 Type 2 diabetes mellitus with unspecified complications: Secondary | ICD-10-CM | POA: Diagnosis not present

## 2022-03-14 DIAGNOSIS — E785 Hyperlipidemia, unspecified: Secondary | ICD-10-CM

## 2022-03-14 DIAGNOSIS — Z794 Long term (current) use of insulin: Secondary | ICD-10-CM

## 2022-03-14 DIAGNOSIS — N138 Other obstructive and reflux uropathy: Secondary | ICD-10-CM | POA: Diagnosis not present

## 2022-03-14 DIAGNOSIS — E1169 Type 2 diabetes mellitus with other specified complication: Secondary | ICD-10-CM | POA: Diagnosis not present

## 2022-03-14 DIAGNOSIS — N401 Enlarged prostate with lower urinary tract symptoms: Secondary | ICD-10-CM | POA: Diagnosis not present

## 2022-03-14 LAB — COMPREHENSIVE METABOLIC PANEL
ALT: 14 U/L (ref 0–53)
AST: 14 U/L (ref 0–37)
Albumin: 4.2 g/dL (ref 3.5–5.2)
Alkaline Phosphatase: 59 U/L (ref 39–117)
BUN: 15 mg/dL (ref 6–23)
CO2: 29 mEq/L (ref 19–32)
Calcium: 9.4 mg/dL (ref 8.4–10.5)
Chloride: 107 mEq/L (ref 96–112)
Creatinine, Ser: 0.65 mg/dL (ref 0.40–1.50)
GFR: 91.11 mL/min (ref >60.00)
Glucose, Bld: 82 mg/dL (ref 70–99)
Potassium: 4.1 mEq/L (ref 3.5–5.1)
Sodium: 142 mEq/L (ref 135–145)
Total Bilirubin: 0.8 mg/dL (ref 0.2–1.2)
Total Protein: 6.3 g/dL (ref 6.0–8.3)

## 2022-03-14 LAB — PSA: PSA: 1.83 ng/mL (ref 0.10–4.00)

## 2022-03-14 LAB — LIPID PANEL
Cholesterol: 84 mg/dL (ref 0–200)
HDL: 38.5 mg/dL — ABNORMAL LOW (ref >39.00)
LDL Cholesterol: 34 mg/dL (ref 0–99)
NonHDL: 45.53
Total CHOL/HDL Ratio: 2
Triglycerides: 59 mg/dL (ref 0.0–149.0)
VLDL: 11.8 mg/dL (ref 0.0–40.0)

## 2022-03-14 LAB — MICROALBUMIN / CREATININE URINE RATIO
Creatinine,U: 170 mg/dL
Microalb Creat Ratio: 0.9 mg/g (ref 0.0–30.0)
Microalb, Ur: 1.4 mg/dL (ref 0.0–1.9)

## 2022-03-14 LAB — HEMOGLOBIN A1C: Hgb A1c MFr Bld: 6.7 % — ABNORMAL HIGH (ref 4.6–6.5)

## 2022-03-14 LAB — VITAMIN D 25 HYDROXY (VIT D DEFICIENCY, FRACTURES): VITD: 39.76 ng/mL (ref 30.00–100.00)

## 2022-03-21 ENCOUNTER — Encounter: Payer: Self-pay | Admitting: Family Medicine

## 2022-03-21 ENCOUNTER — Ambulatory Visit (INDEPENDENT_AMBULATORY_CARE_PROVIDER_SITE_OTHER): Payer: Medicare Other | Admitting: Family Medicine

## 2022-03-21 VITALS — BP 134/66 | HR 87 | Temp 97.6°F | Ht 66.25 in | Wt 142.2 lb

## 2022-03-21 DIAGNOSIS — E1169 Type 2 diabetes mellitus with other specified complication: Secondary | ICD-10-CM

## 2022-03-21 DIAGNOSIS — Z0000 Encounter for general adult medical examination without abnormal findings: Secondary | ICD-10-CM | POA: Diagnosis not present

## 2022-03-21 DIAGNOSIS — Z8673 Personal history of transient ischemic attack (TIA), and cerebral infarction without residual deficits: Secondary | ICD-10-CM

## 2022-03-21 DIAGNOSIS — Z794 Long term (current) use of insulin: Secondary | ICD-10-CM

## 2022-03-21 DIAGNOSIS — Z85828 Personal history of other malignant neoplasm of skin: Secondary | ICD-10-CM | POA: Insufficient documentation

## 2022-03-21 DIAGNOSIS — Z7189 Other specified counseling: Secondary | ICD-10-CM

## 2022-03-21 DIAGNOSIS — R351 Nocturia: Secondary | ICD-10-CM

## 2022-03-21 DIAGNOSIS — E559 Vitamin D deficiency, unspecified: Secondary | ICD-10-CM

## 2022-03-21 DIAGNOSIS — N138 Other obstructive and reflux uropathy: Secondary | ICD-10-CM

## 2022-03-21 DIAGNOSIS — I25118 Atherosclerotic heart disease of native coronary artery with other forms of angina pectoris: Secondary | ICD-10-CM

## 2022-03-21 DIAGNOSIS — M85852 Other specified disorders of bone density and structure, left thigh: Secondary | ICD-10-CM

## 2022-03-21 DIAGNOSIS — I6502 Occlusion and stenosis of left vertebral artery: Secondary | ICD-10-CM

## 2022-03-21 DIAGNOSIS — I10 Essential (primary) hypertension: Secondary | ICD-10-CM

## 2022-03-21 LAB — POC URINALSYSI DIPSTICK (AUTOMATED)
Blood, UA: NEGATIVE
Glucose, UA: POSITIVE — AB
Leukocytes, UA: NEGATIVE
Nitrite, UA: NEGATIVE
Protein, UA: POSITIVE — AB
Spec Grav, UA: >=1.03 — AB (ref 1.010–1.025)
Urobilinogen, UA: 0.2 E.U./dL
pH, UA: 6 (ref 5.0–8.0)

## 2022-03-21 MED ORDER — TAMSULOSIN HCL 0.4 MG PO CAPS: 0.4000 mg | ORAL_CAPSULE | Freq: Every day | ORAL | 6 refills | Status: DC

## 2022-03-21 NOTE — Assessment & Plan Note (Addendum)
Preventative protocols reviewed and updated unless pt declined. Discussed healthy diet and lifestyle.  

## 2022-03-21 NOTE — Assessment & Plan Note (Signed)
Continue aspirin, statin.  Incidentally found.

## 2022-03-21 NOTE — Assessment & Plan Note (Signed)
Appreciate cardiology care.  °

## 2022-03-21 NOTE — Progress Notes (Signed)
Patient ID: Norman Marks, male    DOB: August 27, 1945, 76 y.o.   MRN: [id number]  This visit was conducted in person.  BP 134/66   Pulse 87   Temp 97.6 F (36.4 C) (Temporal)   Ht 5' 6.25" (1.683 m)   Wt 142 lb 4 oz (64.5 kg)   SpO2 98%   BMI 22.79 kg/m    CC: AMW/CPE Subjective:   HPI: Norman Marks is a 76 y.o. male presenting on 03/21/2022 for Medicare Wellness (Pt accompanied by wife, Letta Median. )   Did not see health advisor this year.   Hearing Screening - Comments:: Wears bilateral hearing aids.  Wearing at today's OV.  Vision Screening - Comments:: Last eye exam, 05/2001.  [facility] Office Visit from 03/21/2022 in [place] at [place]  PHQ-2 Total Score 0          03/21/2022   10:02 AM 03/17/2021    9:48 AM 03/04/2020    3:07 PM 03/03/2019    9:06 AM 10/07/2017    4:21 PM  Fall Risk   Falls in the past year? 0 0 0 0 No  Number falls in past yr:   0    Injury with Fall?   0    Risk for fall due to :   Medication side effect    Follow up   Falls evaluation completed;Falls prevention discussed     Wife recovering from recent lumbar lateral fusion.   Planning new dentures next week.   DM - on lantus 20u daily and metformin XR '1000mg'$  daily with breakfast. significant improvement since ozempic started ~6 months ago. Checks fasting sugars daily - 90-120s. Rare <70. He does have hypoglycemic awareness. He uses OneTouch meter - interested in continuous glucose monitoring.    Incidentally found stroke 06/2020 - continues aspirin, statin. 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    Sees cardiology regularly for known CAD with stable angina and LBBB with latest eval 10/2021 [facility]). Did not tolerate imdur due to dizziness/HA.   Preventative: COLONOSCOPY 12/2017 - 4 polyps - SSP, TA, diverticulosis, rpt 3 yrs ([facility]) Colonoscopy 07/2021 - 1 TA, diverticulosis, no f/u needed ([facility]). Prostate cancer  screening - aged out. Nocturia x4-5. requests medication for this. Tried force prostate advance OTC - pills were too big, he couldn't swallow. Denies dysuria but notes daytime urgency and frequency.  Lung cancer screening - not eligible, quit >15 yrs ago DEXA 07/2013 osteopenia T -2.2 femur, -1.2 spine  DEXA 04/2017 osteopenia T score -2.1 hip, -1.9 spine  Will order repeat  Flu yearly COVID vaccine - Moderna 09/2019, 10/2019, booster 05/2020  Penumovax 06/28/2010. Prevnar-13 09/2013 Tdap 2021 Zostavax - 01/2013  shingrix - completed - wil lsend [place] dates  Advanced directive: doesn't have one set up. Wife would be HCPOA. Does not want drastic measures. Has paperwork at home. Asked to bring [place] copy when completed.  Seat belt use discussed Sunscreen use discussed. No changing moles on skin. Sees derm yearly ([facility]) until they closed - needs new derm referral.  Sleep - averages 7-8 hours. Some morning stiffness hen he awakens.  Ex smoker - quit 1994  Alcohol - none  Dentist q6 mo  Eye exam yearly  Bowels - some ongoing constipation managed with stool softeners  Bladder - no incontinence  Caffeine: 1 cup coffee/day   Lives with wife, 1 dog, 5 cats.   Occupation: retired  Edu: 12th grade   Activity: no regular exercise.  Diet: good water, fruits/vegetables daily      Relevant past medical, surgical, family and social history reviewed and updated as indicated. Interim medical history since our last visit reviewed. Allergies and medications reviewed and updated. Outpatient Medications Prior to Visit  Medication Sig Dispense Refill   aspirin 81 MG tablet Take 81 mg by mouth daily.     carvedilol (COREG) 25 MG tablet Take 1 tablet (25 mg total) by mouth 2 (two) times daily with a meal. 180 tablet 3   Cholecalciferol (VITAMIN D3) 25 MCG (1000 UT) CAPS Take 1 capsule (1,000 Units total) by mouth daily. 30 capsule    imiquimod (ALDARA) 5 % cream Apply topically at bedtime. 12 each 0   Insulin  Pen Needle (NOVOFINE PLUS PEN NEEDLE) 32G X 4 MM MISC Use to administer Ozempic once a week. 30 each 0   Insulin Syringe-Needle U-100 (TRUEPLUS INSULIN SYRINGE) 31G X 5/16" 1 ML MISC USE TO INJECT INSULIN AS DIRECTED 100 each 3   LANTUS 100 UNIT/ML injection INJECT 0.45 ML (45 UNITS TOTAL) INTO THESKIN AT BEDTIME 10 mL 3   metFORMIN (GLUCOPHAGE-XR) 500 MG 24 hr tablet TAKE 2 TABLETS BY MOUTH ONCE A DAY  WITHBREAKFAST 180 tablet 3   ONETOUCH ULTRA test strip CHECK BLOOD SUGAR ONCE OR TWICE DAILY ASDIRECTED 100 each 3   polyethylene glycol (MIRALAX / GLYCOLAX) 17 g packet Take 17 g by mouth every other day.     ramipril (ALTACE) 10 MG capsule TAKE 1 CAPSULE BY MOUTH ONCE DAILY 90 capsule 1   Semaglutide,0.25 or 0.'5MG'$ /DOS, (OZEMPIC, 0.25 OR 0.5 MG/DOSE,) 2 MG/3ML SOPN Inject 0.5 mg as directed once a week. 3 mL 6   rosuvastatin (CRESTOR) 20 MG tablet Take 1 tablet (20 mg total) by mouth daily. 90 tablet 3   No facility-administered medications prior to visit.     Per HPI unless specifically indicated in ROS section below Review of Systems  Constitutional:  Negative for activity change, appetite change, chills, fatigue, fever and unexpected weight change.  HENT:  Negative for hearing loss.   Eyes:  Negative for visual disturbance.  Respiratory:  Positive for cough (occ). Negative for chest tightness, shortness of breath and wheezing.   Cardiovascular:  Negative for chest pain, palpitations and leg swelling.  Gastrointestinal:  Positive for constipation (occ). Negative for abdominal distention, abdominal pain, blood in stool, diarrhea, nausea and vomiting.  Genitourinary:  Negative for difficulty urinating and hematuria.  Musculoskeletal:  Negative for arthralgias, myalgias and neck pain.  Skin:  Negative for rash.  Neurological:  Negative for dizziness, seizures, syncope and headaches.  Hematological:  Negative for adenopathy. Bruises/bleeds easily.  Psychiatric/Behavioral:  Negative for  dysphoric mood. The patient is not nervous/anxious.     Objective:  BP 134/66   Pulse 87   Temp 97.6 F (36.4 C) (Temporal)   Ht 5' 6.25" (1.683 m)   Wt 142 lb 4 oz (64.5 kg)   SpO2 98%   BMI 22.79 kg/m   Wt Readings from Last 3 Encounters:  03/21/22 142 lb 4 oz (64.5 kg)  11/08/21 149 lb 2 oz (67.6 kg)  09/18/21 149 lb 8 oz (67.8 kg)      Physical Exam Vitals and nursing note reviewed.  Constitutional:      General: He is not in acute distress.    Appearance: Normal appearance. He is well-developed. He is not ill-appearing.  HENT:     Head:  Normocephalic and atraumatic.     Right Ear: Hearing, tympanic membrane, ear canal and external ear normal.     Left Ear: Hearing, tympanic membrane, ear canal and external ear normal.  Eyes:     General: No scleral icterus.    Extraocular Movements: Extraocular movements intact.     Conjunctiva/sclera: Conjunctivae normal.     Pupils: Pupils are equal, round, and reactive to light.  Neck:     Thyroid: No thyroid mass or thyromegaly.     Vascular: Carotid bruit (left sided mild) present.  Cardiovascular:     Rate and Rhythm: Normal rate and regular rhythm.     Pulses: Normal pulses.          Radial pulses are 2+ on the right side and 2+ on the left side.     Heart sounds: Murmur (2/6 systolic, USB) heard.  Pulmonary:     Effort: Pulmonary effort is normal. No respiratory distress.     Breath sounds: Normal breath sounds. No wheezing, rhonchi or rales.  Abdominal:     General: Bowel sounds are normal. There is no distension.     Palpations: Abdomen is soft. There is no mass.     Tenderness: There is no abdominal tenderness. There is no guarding or rebound.     Hernia: No hernia is present.  Musculoskeletal:        General: Normal range of motion.     Cervical back: Normal range of motion and neck supple.     Right lower leg: No edema.     Left lower leg: No edema.  Lymphadenopathy:     Cervical: No cervical adenopathy.   Skin:    General: Skin is warm and dry.     Findings: No rash.  Neurological:     General: No focal deficit present.     Mental Status: He is alert and oriented to person, place, and time.     Comments:  Recall 3/3 Calculation 5/5 serial 3s  Psychiatric:        Mood and Affect: Mood normal.        Behavior: Behavior normal.        Thought Content: Thought content normal.        Judgment: Judgment normal.       Results for orders placed or performed in visit on 03/21/22  POCT Urinalysis Dipstick (Automated)  Result Value Ref Range   Color, UA dark yellow    Clarity, UA clear    Glucose, UA Positive (A) Negative   Bilirubin, UA 1+    Ketones, UA 1+    Spec Grav, UA >=1.030 (A) 1.010 - 1.025   Blood, UA negative    pH, UA 6.0 5.0 - 8.0   Protein, UA Positive (A) Negative   Urobilinogen, UA 0.2 0.2 or 1.0 E.U./dL   Nitrite, UA negative    Leukocytes, UA Negative Negative   Lab Results  Component Value Date   HGBA1C 6.7 (H) 03/14/2022    Lab Results  Component Value Date   CHOL 84 03/14/2022   HDL 38.50 (L) 03/14/2022   LDLCALC 34 03/14/2022   LDLDIRECT 65.0 01/23/2016   TRIG 59.0 03/14/2022   CHOLHDL 2 03/14/2022    Assessment & Plan:   Problem List Items Addressed This Visit     Medicare annual wellness visit, subsequent - Primary (Chronic)    I have personally reviewed the Medicare Annual Wellness questionnaire and have noted 1. The patient's medical and social history  2. Their use of alcohol, tobacco or illicit drugs 3. Their current medications and supplements 4. The patient's functional ability including ADL's, fall risks, home safety risks and hearing or visual impairment. Cognitive function has been assessed and addressed as indicated.  5. Diet and physical activity 6. Evidence for depression or mood disorders The patients weight, height, BMI have been recorded in the chart. I have made referrals, counseling and provided education to the patient based  on review of the above and I have provided the pt with a written personalized care plan for preventive services. Provider list updated.. See scanned questionairre as needed for further documentation. Reviewed preventative protocols and updated unless pt declined.       Advanced care planning/counseling discussion (Chronic)   Health maintenance examination (Chronic)    Preventative protocols reviewed and updated unless pt declined. Discussed healthy diet and lifestyle.       Diabetes mellitus type 2, controlled, with complications ([place])    Chronic, great control on current regimen, has been able to decrease latus dose to 20u daily since starting ozempic - continue current regimen.  Will refer to pharmacy for CGM eligibility evaluation.       BPH with obstruction/lower urinary tract symptoms    Notes worsening nocturia as well as daytime urgency/frequency.  UA today - concentrated with sugar bot no blood or LE/nitrites - will encourage increased water intake.  Has not previously tried flomax - will start this, monitoring for orthostatic dizziness.       Relevant Medications   tamsulosin (FLOMAX) 0.4 MG CAPS capsule   HTN (hypertension)    Chronic, stable on ramipril and carvedilol.       Hyperlipidemia associated with type 2 diabetes mellitus (HCC)    Chronic, continue crestor '20mg'$  daily. LDL at goal <70.  The ASCVD Risk score (Arnett DK, et al., 2019) failed to calculate for the following reasons:   The valid total cholesterol range is 130 to 320 mg/dL       CAD (coronary artery disease)    Appreciate cardiology care.       Osteopenia    Discussed rpt DEXA to monitor osteopenia - continues daily vit D, discussed dietary calcium intake and regular weight bearing exercises.       Relevant Orders   DG Bone Density   Vitamin D deficiency    Continue daily vitamin D supplement.      Vertebral artery stenosis    Continue aspirin, statin.      History of CVA in adulthood     Continue aspirin, statin.  Incidentally found.      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  Dr. Onalee Hua office has closed- patient requests referral to dermatologist in [place].      Relevant Orders   Ambulatory referral to Dermatology   RESOLVED: Nocturia   Relevant Orders   POCT Urinalysis Dipstick (Automated) (Completed)     Meds ordered this encounter  Medications   tamsulosin (FLOMAX) 0.4 MG CAPS capsule    Sig: Take 1 capsule (0.4 mg total) by mouth daily.    Dispense:  30 capsule    Refill:  6   Orders Placed This Encounter  Procedures   DG Bone Density    Standing Status:   Future    Standing Expiration Date:   03/22/2023    Order Specific Question:   Reason for Exam (SYMPTOM  OR DIAGNOSIS REQUIRED)    Answer:  f/u osteopenia    Order Specific Question:   Preferred imaging location?    Answer:   [facility]   Ambulatory referral to Dermatology    Referral Priority:   Routine    Referral Type:   Consultation    Referral Reason:   Specialty Services Required    Requested Specialty:   Dermatology    Number of Visits Requested:   1   POCT Urinalysis Dipstick (Automated)    Patient instructions: Schedule appointment with our pharmacist Mendel Ryder to go over continuous glucose monitor.  Call to to schedule bone density scan at your convenience: [facility] at [phone] Let us know dates of shingrix vaccines.  Try flomax 0.'4mg'$  nightly - if dizziness with standing develops, stop medicine. Urinalysis today.  We will refer you to new dermatologist.  Return in 6 months for diabetes follow up visit.  Good to see you today.   Follow up plan: Return in about 6 months (around 09/19/2022) for follow up visit.  Ria Bush, MD

## 2022-03-21 NOTE — Patient Instructions (Addendum)
Schedule appointment with our pharmacist Mendel Ryder to go over continuous glucose monitor.  Call to to schedule bone density scan at your convenience: [facility] at [phone] Let us know dates of shingrix vaccines.  Try flomax 0.'4mg'$  nightly - if dizziness with standing develops, stop medicine. Urinalysis today.  We will refer you to new dermatologist.  Return in 6 months for diabetes follow up visit.  Good to see you today.   Health Maintenance After Age 75 After age 27, you are at a higher risk for certain long-term diseases and infections as well as injuries from falls. Falls are a major cause of broken bones and head injuries in people who are older than age 42. Getting regular preventive care can help to keep you healthy and well. Preventive care includes getting regular testing and making lifestyle changes as recommended by your health care provider. Talk with your health care provider about: Which screenings and tests you should have. A screening is a test that checks for a disease when you have no symptoms. A diet and exercise plan that is right for you. What should I know about screenings and tests to prevent falls? Screening and testing are the best ways to find a health problem early. Early diagnosis and treatment give you the best chance of managing medical conditions that are common after age 23. Certain conditions and lifestyle choices may make you more likely to have a fall. Your health care provider may recommend: Regular vision checks. Poor vision and conditions such as cataracts can make you more likely to have a fall. If you wear glasses, make sure to get your prescription updated if your vision changes. Medicine review. Work with your health care provider to regularly review all of the medicines you are taking, including over-the-counter medicines. Ask your health care provider about any side effects that may make you more likely to have a fall. Tell your health care provider if any  medicines that you take make you feel dizzy or sleepy. Strength and balance checks. Your health care provider may recommend certain tests to check your strength and balance while standing, walking, or changing positions. Foot health exam. Foot pain and numbness, as well as not wearing proper footwear, can make you more likely to have a fall. Screenings, including: Osteoporosis screening. Osteoporosis is a condition that causes the bones to get weaker and break more easily. Blood pressure screening. Blood pressure changes and medicines to control blood pressure can make you feel dizzy. Depression screening. You may be more likely to have a fall if you have a fear of falling, feel depressed, or feel unable to do activities that you used to do. Alcohol use screening. Using too much alcohol can affect your balance and may make you more likely to have a fall. Follow these instructions at home: Lifestyle Do not drink alcohol if: Your health care provider tells you not to drink. If you drink alcohol: Limit how much you have to: 0-1 drink a day for women. 0-2 drinks a day for men. Know how much alcohol is in your drink. In the [place] one drink equals one 12 oz bottle of beer (355 mL), one 5 oz glass of wine (148 mL), or one 1 oz glass of hard liquor (44 mL). Do not use any products that contain nicotine or tobacco. These products include cigarettes, chewing tobacco, and vaping devices, such as e-cigarettes. If you need help quitting, ask your health care provider. Activity  Follow a regular exercise program to  stay fit. This will help you maintain your balance. Ask your health care provider what types of exercise are appropriate for you. If you need a cane or walker, use it as recommended by your health care provider. Wear supportive shoes that have nonskid soles. Safety  Remove any tripping hazards, such as rugs, cords, and clutter. Install safety equipment such as grab bars in bathrooms and  safety rails on stairs. Keep rooms and walkways well-lit. General instructions Talk with your health care provider about your risks for falling. Tell your health care provider if: You fall. Be sure to tell your health care provider about all falls, even ones that seem minor. You feel dizzy, tiredness (fatigue), or off-balance. Take over-the-counter and prescription medicines only as told by your health care provider. These include supplements. Eat a healthy diet and maintain a healthy weight. A healthy diet includes low-fat dairy products, low-fat (lean) meats, and fiber from whole grains, beans, and lots of fruits and vegetables. Stay current with your vaccines. Schedule regular health, dental, and eye exams. Summary Having a healthy lifestyle and getting preventive care can help to protect your health and wellness after age 57. Screening and testing are the best way to find a health problem early and help you avoid having a fall. Early diagnosis and treatment give you the best chance for managing medical conditions that are more common for people who are older than age 37. Falls are a major cause of broken bones and head injuries in people who are older than age 3. Take precautions to prevent a fall at home. Work with your health care provider to learn what changes you can make to improve your health and wellness and to prevent falls. This information is not intended to replace advice given to you by your health care provider. Make sure you discuss any questions you have with your health care provider. Document Revised: 11/21/2020 Document Reviewed: 11/21/2020 Elsevier Patient Education  [place].

## 2022-03-21 NOTE — Assessment & Plan Note (Addendum)
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  Dr. Onalee Hua office has closed- patient requests referral to dermatologist in [place].

## 2022-03-21 NOTE — Assessment & Plan Note (Signed)

## 2022-03-21 NOTE — Assessment & Plan Note (Signed)
Chronic, great control on current regimen, has been able to decrease latus dose to 20u daily since starting ozempic - continue current regimen.  Will refer to pharmacy for CGM eligibility evaluation.

## 2022-03-21 NOTE — Assessment & Plan Note (Signed)
Notes worsening nocturia as well as daytime urgency/frequency.  UA today - concentrated with sugar bot no blood or LE/nitrites - will encourage increased water intake.  Has not previously tried flomax - will start this, monitoring for orthostatic dizziness.

## 2022-03-21 NOTE — Assessment & Plan Note (Signed)
Discussed rpt DEXA to monitor osteopenia - continues daily vit D, discussed dietary calcium intake and regular weight bearing exercises.

## 2022-03-21 NOTE — Assessment & Plan Note (Addendum)
Chronic, stable on ramipril and carvedilol.

## 2022-03-21 NOTE — Assessment & Plan Note (Signed)
Continue daily vitamin D supplement 

## 2022-03-21 NOTE — Assessment & Plan Note (Signed)
Chronic, continue crestor '20mg'$  daily. LDL at goal <70.  The ASCVD Risk score (Arnett DK, et al., 2019) failed to calculate for the following reasons:   The valid total cholesterol range is 130 to 320 mg/dL

## 2022-03-21 NOTE — Assessment & Plan Note (Signed)
Continue aspirin, statin.  

## 2022-03-23 ENCOUNTER — Telehealth: Payer: Self-pay | Admitting: Pharmacist

## 2022-03-23 DIAGNOSIS — Z794 Long term (current) use of insulin: Secondary | ICD-10-CM

## 2022-03-23 MED ORDER — FREESTYLE LIBRE 3 SENSOR MISC: 3 refills | Status: DC

## 2022-03-23 NOTE — Telephone Encounter (Signed)
Per PCP request, spoke with patient regarding CGM options. He is on insulin so his insurance will cover either [place] or Dexcom. Opted to use Libre 3 with patient's iPhone.  Patient will call back to set up appt for [facility] 3 training with PharmD.

## 2022-03-26 ENCOUNTER — Telehealth: Payer: Self-pay | Admitting: Family Medicine

## 2022-03-26 NOTE — Telephone Encounter (Signed)
[place] does require prior [place].  Charlene Brooke, CPP notified  Avel Sensor, [place]  [phone]

## 2022-03-26 NOTE — Telephone Encounter (Signed)
Submitted PA via covermymeds. Pharmacy does not carry [place] 3 reader so it will have to go through DME.  Ordered Freestyle Libre 3 to [facility].

## 2022-03-26 NOTE — Telephone Encounter (Signed)
Patient called about glucose monitor and wanted to talk to [facility] the pharmacist. Call back number [phone].

## 2022-03-26 NOTE — Telephone Encounter (Signed)
Spoke with patient. Scheduled CGM training [facility]) for Fri 9/15 @ 9:00.

## 2022-03-27 ENCOUNTER — Telehealth: Payer: Self-pay

## 2022-03-27 NOTE — Telephone Encounter (Signed)
Pt call in stated his insurance need authorization to get [employer] . Would like a call back to discuss  [phone]

## 2022-03-27 NOTE — Telephone Encounter (Signed)
See other phone note - assistant is following up with patient.

## 2022-03-27 NOTE — Telephone Encounter (Signed)
Reached out and contacted the patient.They will keep appointment on Friday with CPP. I explained that the Elenor Legato will be coming from a [place] and that they would be contacting the patient for some information.  Charlene Brooke, CPP notified  Avel Sensor, [place]  [phone]

## 2022-03-27 NOTE — Telephone Encounter (Signed)
Prior auth started for [employer] 3 Sensor. Emidio Heinrich KeyArlyce Dice - PA Case ID: [id number] - Rx #: [id number] This prior Josem Kaufmann was unable to be completed on Cover my meds.  I got a message to call [facility] Medicare because [facility] does not process this PA.  I called [employer] Medicare, spoke to [facility], who stated that I needed to call [facility] because they need to process this PA.  When I called [facility], I spoke to [place], who stated that this FreeStyle Libre 3 sensor is a plan exclusion.  The preferred sensor is Freestyle Libre 2 (14 day sensor).  She also mentioned Dexcom, but did not give me an exact style that would be approved.

## 2022-03-29 ENCOUNTER — Ambulatory Visit (INDEPENDENT_AMBULATORY_CARE_PROVIDER_SITE_OTHER)
Admission: RE | Admit: 2022-03-29 | Discharge: 2022-03-29 | Disposition: A | Discharge status: Home / Self Care | Payer: Medicare Other | Source: Ambulatory Visit | Attending: Family Medicine | Admitting: Family Medicine

## 2022-03-29 DIAGNOSIS — M85852 Other specified disorders of bone density and structure, left thigh: Secondary | ICD-10-CM | POA: Diagnosis not present

## 2022-03-29 NOTE — Telephone Encounter (Signed)
Just checking with Ria Comment - should I order freestyle libre 2 or are we going to go through DME?  Thanks!

## 2022-03-29 NOTE — Telephone Encounter (Signed)
Sorry Anda Kraft - I actually ended up sending this through DME and it has been approved.

## 2022-03-30 ENCOUNTER — Ambulatory Visit: Payer: Medicare Other | Admitting: Pharmacist

## 2022-03-30 DIAGNOSIS — Z794 Long term (current) use of insulin: Secondary | ICD-10-CM

## 2022-03-30 DIAGNOSIS — E1169 Type 2 diabetes mellitus with other specified complication: Secondary | ICD-10-CM

## 2022-03-30 NOTE — Patient Instructions (Signed)
Visit Information  Phone number for Pharmacist: [phone]   Goals Addressed   None     Care Plan : General Pharmacy (Adult)  Updates made by Charlton Haws, RPH since 03/30/2022 12:00 AM     Problem: Diabetes      Long-Range Goal: Patient-Specific Goal   Start Date: 03/30/2022  Expected End Date: 06/29/2022  This Visit's Progress: On track  Priority: High  Note:   Current Barriers:  Unable to independently monitor therapeutic efficacy  Pharmacist Clinical Goal(s):  Patient will achieve adherence to monitoring guidelines and medication adherence to achieve therapeutic efficacy through collaboration with PharmD and provider.   Interventions: 1:1 collaboration with Ria Bush, MD regarding development and update of comprehensive plan of care as evidenced by provider attestation and co-signature Inter-disciplinary care team collaboration (see longitudinal plan of care) Comprehensive medication review performed; medication list updated in electronic medical record  Diabetes (A1c goal <7%) -Controlled - A1c 6.7% -Freestyle Libe training completed today -Current medications: Lantus 45 units daily Metformin XR 500 mg - 2 tab daily Ozempic 0.5 mg weekly -Medications previously tried: glyburide, janumet,   -Educated on Continuous glucose monitoring; (see summary) -Recommended to continue current medication  Patient Goals/Self-Care Activities Patient will:  - take medications as prescribed as evidenced by patient report and record review focus on medication adherence by routine check glucose using CGM, document, and provide at future appointments      Patient verbalizes understanding of instructions and care plan provided today and agrees to view in [place]. Active MyChart status and patient understanding of how to access instructions and care plan via MyChart confirmed with patient.    Telephone follow up appointment with pharmacy team member scheduled for: 2  weeks  Charlene Brooke, PharmD, [facility] Clinical Pharmacist [place] [facility] at [facility] [phone]

## 2022-03-30 NOTE — Progress Notes (Signed)
Care Management   Pharmacy Note  03/30/2022 Name: Norman Marks MRN: [id number] DOB: 1945-11-02  Summary: Initial visit Patient presented for [facility] 3 training.  Demonstrated with teach-back: -components of sensor and how to prepare sensor for application -how to apply sensor to arm. Patient successfully applied sensor to R arm today -how to navigate [employer] app in order to pair sensor; sensor was successfully paired today -how to adjust high/low sugar alarms. Low alarm set to 70 and high alarm set to 250 today. -App users: demonstrated navigating various components including settings, logbook, and daily patterns  Educated patient on: -scanning sensor at least every 8 hours to ensure no gaps in data -changing sensor every 14 days or as initiated by app/reader -avoiding high doses of Vitamin C > 500 mg/day -removing sensor prior to MRI, CT scans -if sugar readings ever seem wrong/questionable, check sugar with a finger stick -Libreview cloud monitoring: Patient did enroll in [place]  Provided customer service line for [employer] in case of sensor breakdowns: [phone]   Pt voiced understanding of above and denies further questions.  Plan: -Pharmacist follow up televisit scheduled for 2 weeks    Subjective: Norman Marks is a 76 y.o. year old male who is a primary care patient of Ria Bush, MD. The Care Management team was consulted for assistance with care management and care coordination needs.    Engaged with patient face to face for initial visit in response to provider referral for pharmacy case management and/or care coordination services.   The patient was given information about Care Management services today including:  Care Management services includes personalized support from designated clinical staff supervised by the patient's primary care provider, including individualized plan of care and coordination with other care  providers. 24/7 contact phone numbers for assistance for urgent and routine care needs. The patient may stop case management services at any time by phone call to the office staff.  Patient agreed to services and consent obtained.  Assessment:  Review of patient status, including review of consultants reports, laboratory and other test data, was performed as part of comprehensive evaluation and provision of chronic care management services.   SDOH (Social Determinants of Health) assessments and interventions performed:  SDOH Interventions    Flowsheet Row Clinical Support from 03/04/2020 in [place] at [place]  SDOH Interventions   Depression Interventions/Treatment  PHQ2-9 Score <4 Follow-up Not Indicated        Objective:  Lab Results  Component Value Date   CREATININE 0.65 03/14/2022   CREATININE 0.85 06/20/2021   CREATININE 0.79 03/13/2021    Lab Results  Component Value Date   HGBA1C 6.7 (H) 03/14/2022       Component Value Date/Time   CHOL 84 03/14/2022 0817   CHOL 83 (L) 09/06/2021 0821   TRIG 59.0 03/14/2022 0817   HDL 38.50 (L) 03/14/2022 0817   HDL 33 (L) 09/06/2021 0821   CHOLHDL 2 03/14/2022 0817   VLDL 11.8 03/14/2022 0817   LDLCALC 34 03/14/2022 0817   LDLCALC 35 09/06/2021 0821   LDLDIRECT 65.0 01/23/2016 0931     Clinical ASCVD: Yes  The ASCVD Risk score (Arnett DK, et al., 2019) failed to calculate for the following reasons:   The valid total cholesterol range is 130 to 320 mg/dL     BP Readings from Last 3 Encounters:  03/21/22 134/66  11/08/21 110/64  09/18/21 140/78    Care Plan  Allergies  Allergen Reactions  Metformin And Related Diarrhea    With higher doses, tolerating QD dosing well   Percocet [Oxycodone-Acetaminophen] Itching and Nausea Only    Medications Reviewed Today     Reviewed by Charlton Haws, [facility] (Pharmacist) on 03/30/22 at [phone]  Med List Status: <None>   Medication Order Taking? Sig  Documenting Provider Last Dose Status Informant  aspirin 81 MG tablet [id number] Yes Take 81 mg by mouth daily. [provider] Taking Active Self  carvedilol (COREG) 25 MG tablet [id number] Yes Take 1 tablet (25 mg total) by mouth 2 (two) times daily with a meal. Wellington Hampshire, MD Taking Active   Cholecalciferol (VITAMIN D3) 25 MCG (1000 UT) CAPS [id number] Yes Take 1 capsule (1,000 Units total) by mouth daily. Ria Bush, MD Taking Active Self  imiquimod Leroy Sea) 5 % cream [id number] Yes Apply topically at bedtime. Lavonna Monarch, MD Taking Active Self  Insulin Pen Needle (NOVOFINE PLUS PEN NEEDLE) 32G X 4 MM MISC [id number] Yes Use to administer Ozempic once a week. Ria Bush, MD Taking Active Self  Insulin Syringe-Needle U-100 (TRUEPLUS INSULIN SYRINGE) 31G X 5/16" 1 ML MISC [id number] Yes USE TO INJECT INSULIN AS DIRECTED Ria Bush, MD Taking Active   LANTUS 100 UNIT/ML injection [id number] Yes INJECT 0.45 ML (45 UNITS TOTAL) INTO THESKIN AT BEDTIME Ria Bush, MD Taking Active   metFORMIN (GLUCOPHAGE-XR) 500 MG 24 hr tablet [id number] Yes TAKE 2 TABLETS BY MOUTH ONCE A DAY  Ulanda Edison, MD Taking Active   [facility] ULTRA test strip [id number] Yes CHECK BLOOD SUGAR ONCE OR TWICE DAILY ASDIRECTED Ria Bush, MD Taking Active   polyethylene glycol (MIRALAX / GLYCOLAX) 17 g packet [id number] Yes Take 17 g by mouth every other day. [provider] Taking Active Self  ramipril (ALTACE) 10 MG capsule [id number] Yes TAKE 1 CAPSULE BY MOUTH ONCE DAILY Ria Bush, MD Taking Active   rosuvastatin (CRESTOR) 20 MG tablet [id number]  Take 1 tablet (20 mg total) by mouth daily. Wellington Hampshire, MD  Expired 11/08/21 2359   Semaglutide,0.25 or 0.'5MG'$ /DOS, (OZEMPIC, 0.25 OR 0.5 MG/DOSE,) 2 MG/3ML SOPN [id number] Yes Inject 0.5 mg as directed once a week. Ria Bush, MD Taking Active   tamsulosin [facility]) 0.4 MG CAPS capsule [id number]  Yes Take 1 capsule (0.4 mg total) by mouth daily. Ria Bush, MD Taking Active             Patient Active Problem List   Diagnosis Date Noted   History of skin cancer 03/21/2022   Constipation 03/18/2021   History of CVA in adulthood 06/27/2020   Vertebral artery stenosis 06/24/2020   Localized skin mass, lump, or swelling 09/07/2019   Primary localized osteoarthritis of left knee 10/02/2018   Peroneal tendon tear 08/04/2017   TMJ tenderness, right 06/13/2017   Advanced care planning/counseling discussion 09/20/2014   Health maintenance examination 09/20/2014   Left bundle branch block 07/26/2014   Osteoarthritis 01/26/2014   Primary localized osteoarthrosis of shoulder region 01/26/2014   Skin lump of leg 09/16/2013   Transaminitis 05/18/2013   Medicare annual wellness visit, subsequent 09/10/2012   Hypotestosteronism 05/09/2012   Vitamin D deficiency 02/02/2012   Diabetes mellitus type 2, controlled, with complications ([place])    Hx of migraines    BPH with obstruction/lower urinary tract symptoms    HTN (hypertension)    Hyperlipidemia associated with type 2 diabetes mellitus ([place])    CAD (coronary artery disease)    Osteopenia     Conditions to be  addressed/monitored: DMII  Care Plan : General Pharmacy (Adult)  Updates made by Charlton Haws, RPH since 03/30/2022 12:00 AM     Problem: Diabetes      Long-Range Goal: Patient-Specific Goal   Start Date: 03/30/2022  Expected End Date: 06/29/2022  This Visit's Progress: On track  Priority: High  Note:   Current Barriers:  Unable to independently monitor therapeutic efficacy  Pharmacist Clinical Goal(s):  Patient will achieve adherence to monitoring guidelines and medication adherence to achieve therapeutic efficacy through collaboration with PharmD and provider.   Interventions: 1:1 collaboration with Ria Bush, MD regarding development and update of comprehensive plan of care as evidenced by  provider attestation and co-signature Inter-disciplinary care team collaboration (see longitudinal plan of care) Comprehensive medication review performed; medication list updated in electronic medical record  Diabetes (A1c goal <7%) -Controlled - A1c 6.7% -Freestyle Libe training completed today -Current medications: Lantus 45 units daily Metformin XR 500 mg - 2 tab daily Ozempic 0.5 mg weekly -Medications previously tried: glyburide, janumet,   -Educated on Continuous glucose monitoring; (see summary) -Recommended to continue current medication  Patient Goals/Self-Care Activities Patient will:  - take medications as prescribed as evidenced by patient report and record review focus on medication adherence by routine check glucose using CGM, document, and provide at future appointments     Medication Assistance:  None required.  Patient affirms current coverage meets needs.  Follow Up:  Patient agrees to Care Plan and Follow-up.  Plan: Telephone follow up appointment with care management team member scheduled for:  2 weeks  Charlene Brooke, PharmD, [facility] Clinical Pharmacist [place] [facility] at [facility] [phone]

## 2022-04-10 ENCOUNTER — Telehealth: Payer: Self-pay

## 2022-04-10 NOTE — Progress Notes (Signed)
    Chronic Care Management Pharmacy Assistant   Name: Norman Marks  MRN: [id number] DOB: June 08, 1946  Reason for Encounter: {Follow-Up Reason:24148}   Conditions to be addressed/monitored: {CCM ASSESSMENT DISEASE OPTIONS:25047}  Primary concerns for visit include: ***   Recent office visits:  ***  Recent consult visits:  [facility] visits:  {Hospital DC Yes/No:21091515}  Medications: Outpatient Encounter Medications as of 04/10/2022  Medication Sig   aspirin 81 MG tablet Take 81 mg by mouth daily.   carvedilol (COREG) 25 MG tablet Take 1 tablet (25 mg total) by mouth 2 (two) times daily with a meal.   Cholecalciferol (VITAMIN D3) 25 MCG (1000 UT) CAPS Take 1 capsule (1,000 Units total) by mouth daily.   imiquimod (ALDARA) 5 % cream Apply topically at bedtime.   Insulin Pen Needle (NOVOFINE PLUS PEN NEEDLE) 32G X 4 MM MISC Use to administer Ozempic once a week.   Insulin Syringe-Needle U-100 (TRUEPLUS INSULIN SYRINGE) 31G X 5/16" 1 ML MISC USE TO INJECT INSULIN AS DIRECTED   LANTUS 100 UNIT/ML injection INJECT 0.45 ML (45 UNITS TOTAL) INTO THESKIN AT BEDTIME   metFORMIN (GLUCOPHAGE-XR) 500 MG 24 hr tablet TAKE 2 TABLETS BY MOUTH ONCE A DAY  WITHBREAKFAST   ONETOUCH ULTRA test strip CHECK BLOOD SUGAR ONCE OR TWICE DAILY ASDIRECTED   polyethylene glycol (MIRALAX / GLYCOLAX) 17 g packet Take 17 g by mouth every other day.   ramipril (ALTACE) 10 MG capsule TAKE 1 CAPSULE BY MOUTH ONCE DAILY   rosuvastatin (CRESTOR) 20 MG tablet Take 1 tablet (20 mg total) by mouth daily.   Semaglutide,0.25 or 0.'5MG'$ /DOS, (OZEMPIC, 0.25 OR 0.5 MG/DOSE,) 2 MG/3ML SOPN Inject 0.5 mg as directed once a week.   tamsulosin (FLOMAX) 0.4 MG CAPS capsule Take 1 capsule (0.4 mg total) by mouth daily.   No facility-administered encounter medications on file as of 04/10/2022.    Care Gaps:  Star Rating Drugs:  SIG***

## 2022-04-16 ENCOUNTER — Ambulatory Visit: Payer: Medicare Other | Admitting: Pharmacist

## 2022-04-16 DIAGNOSIS — Z794 Long term (current) use of insulin: Secondary | ICD-10-CM

## 2022-04-16 NOTE — Patient Instructions (Addendum)
Visit Information  Phone number for Pharmacist: [phone]   Goals Addressed   None     Care Plan : General Pharmacy (Adult)  Updates made by Charlton Haws, RPH since 04/16/2022 12:00 AM     Problem: Diabetes      Long-Range Goal: Patient-Specific Goal   Start Date: 03/30/2022  Expected End Date: 04/16/2022  Recent Progress: On track  Priority: High  Note:   Current Barriers:  None identified  Pharmacist Clinical Goal(s):  Patient will achieve adherence to monitoring guidelines and medication adherence to achieve therapeutic efficacy through collaboration with PharmD and provider.   Interventions: 1:1 collaboration with Ria Bush, MD regarding development and update of comprehensive plan of care as evidenced by provider attestation and co-signature Inter-disciplinary care team collaboration (see longitudinal plan of care) Comprehensive medication review performed; medication list updated in electronic medical record  Diabetes (A1c goal <7%) -Controlled - A1c 6.7% -Freestyle Libe training completed 9/15 Reviewed AGP report: 03/31/22 to 04/13/22. Sensor active: 97%  Time in range (70-180): 62% (goal > 70%)  High (>180): 38%  Low (< 70): 0% (goal < 4%)  GMI: 7.4%; Average glucose: 170  -Current medications: Lantus 20 units daily Metformin XR 500 mg - 2 tab daily Ozempic 0.5 mg weekly -Medications previously tried: glyburide, janumet,   -Educated on Continuous glucose monitoring; discussed AGP report and blood sugar spikes after eating; reviewed importance of identifying foods that increase sugar and limiting those foods -Recommended to continue current medication  Patient Goals/Self-Care Activities Patient will:  - take medications as prescribed as evidenced by patient report and record review focus on medication adherence by routine check glucose using CGM, document, and provide at future appointments      Patient verbalizes understanding of  instructions and care plan provided today and agrees to view in [place]. Active MyChart status and patient understanding of how to access instructions and care plan via MyChart confirmed with patient.    The patient has been provided with contact information for the care management team and has been advised to call with any health related questions or concerns.   Charlene Brooke, PharmD, BCACP Clinical Pharmacist [place] [facility] at [facility] [phone]

## 2022-04-16 NOTE — Progress Notes (Signed)
Care Management   Pharmacy Note  04/16/2022 Name: Norman Marks MRN: [id number] DOB: February 28, 1946  Summary: F/U visit -DM: A1c 6.7% (02/2022); Pt is using Freestyle Libre 3; he has switched from phone to reader because his phone does not have unlimited data -Reviewed AGP report: 03/31/22 to 04/13/22. Sensor active: 97%  Time in range (70-180): 62% (goal > 70%)  High (>180): 38%  Low (< 70): 0% (goal < 4%)  GMI: 7.4%; Average glucose: 170  Recommendations/Changes made from today's visit: -No med changes; reviewed diet changes to reduce blood sugar spikes after eating  Plan: -The patient has been provided with contact information for the care management team and has been advised to call with any health related questions or concerns.  -PCP appt 09/19/22    Subjective: Norman Marks is a 76 y.o. year old male who is a primary care patient of Ria Bush, MD. The Care Management team was consulted for assistance with care management and care coordination needs.    Engaged with patient by telephone for follow up visit in response to provider referral for pharmacy case management and/or care coordination services.   The patient was given information about Care Management services today including:  Care Management services includes personalized support from designated clinical staff supervised by the patient's primary care provider, including individualized plan of care and coordination with other care providers. 24/7 contact phone numbers for assistance for urgent and routine care needs. The patient may stop case management services at any time by phone call to the office staff.  Patient agreed to services and consent obtained.  Assessment:  Review of patient status, including review of consultants reports, laboratory and other test data, was performed as part of comprehensive evaluation and provision of chronic care management services.   SDOH (Social Determinants of Health)  assessments and interventions performed:  SDOH Interventions    Flowsheet Row Clinical Support from 03/04/2020 in [place] at [place]  SDOH Interventions   Depression Interventions/Treatment  PHQ2-9 Score <4 Follow-up Not Indicated        Objective:  Lab Results  Component Value Date   CREATININE 0.65 03/14/2022   CREATININE 0.85 06/20/2021   CREATININE 0.79 03/13/2021    Lab Results  Component Value Date   HGBA1C 6.7 (H) 03/14/2022       Component Value Date/Time   CHOL 84 03/14/2022 0817   CHOL 83 (L) 09/06/2021 0821   TRIG 59.0 03/14/2022 0817   HDL 38.50 (L) 03/14/2022 0817   HDL 33 (L) 09/06/2021 0821   CHOLHDL 2 03/14/2022 0817   VLDL 11.8 03/14/2022 0817   LDLCALC 34 03/14/2022 0817   LDLCALC 35 09/06/2021 0821   LDLDIRECT 65.0 01/23/2016 0931     Clinical ASCVD: Yes  The ASCVD Risk score (Arnett DK, et al., 2019) failed to calculate for the following reasons:   The valid total cholesterol range is 130 to 320 mg/dL     BP Readings from Last 3 Encounters:  03/21/22 134/66  11/08/21 110/64  09/18/21 140/78    Care Plan  Allergies  Allergen Reactions   Metformin And Related Diarrhea    With higher doses, tolerating QD dosing well   Percocet [Oxycodone-Acetaminophen] Itching and Nausea Only    Medications Reviewed Today     Reviewed by Charlton Haws, [facility] (Pharmacist) on 04/16/22 at [place] List Status: <None>   Medication Order Taking? Sig Documenting Provider Last Dose Status Informant  aspirin 81 MG tablet  [id number] Yes Take 81 mg by mouth daily. [provider] Taking Active Self  carvedilol (COREG) 25 MG tablet [id number] Yes Take 1 tablet (25 mg total) by mouth 2 (two) times daily with a meal. Wellington Hampshire, MD Taking Active   Cholecalciferol (VITAMIN D3) 25 MCG (1000 UT) CAPS [id number] Yes Take 1 capsule (1,000 Units total) by mouth daily. Ria Bush, MD Taking Active Self  imiquimod Leroy Sea) 5 %  cream [id number] Yes Apply topically at bedtime. Lavonna Monarch, MD Taking Active Self  Insulin Pen Needle (NOVOFINE PLUS PEN NEEDLE) 32G X 4 MM MISC [id number] Yes Use to administer Ozempic once a week. Ria Bush, MD Taking Active Self  Insulin Syringe-Needle U-100 (TRUEPLUS INSULIN SYRINGE) 31G X 5/16" 1 ML MISC [id number] Yes USE TO INJECT INSULIN AS DIRECTED Ria Bush, MD Taking Active   LANTUS 100 UNIT/ML injection [id number] Yes INJECT 0.45 ML (45 UNITS TOTAL) INTO THESKIN AT BEDTIME  Patient taking differently: Inject 20 Units into the skin daily. INJECT 0.45 ML (45 UNITS TOTAL) INTO THESKIN AT BEDTIME   Ria Bush, MD Taking Active   metFORMIN (GLUCOPHAGE-XR) 500 MG 24 hr tablet [id number] Yes TAKE 2 TABLETS BY MOUTH ONCE A DAY  Ulanda Edison, MD Taking Active   [facility] ULTRA test strip [id number] Yes CHECK BLOOD SUGAR ONCE OR TWICE DAILY ASDIRECTED Ria Bush, MD Taking Active   polyethylene glycol (MIRALAX / GLYCOLAX) 17 g packet [id number] Yes Take 17 g by mouth every other day. [provider] Taking Active Self  ramipril (ALTACE) 10 MG capsule [id number] Yes TAKE 1 CAPSULE BY MOUTH ONCE DAILY Ria Bush, MD Taking Active   rosuvastatin (CRESTOR) 20 MG tablet [id number]  Take 1 tablet (20 mg total) by mouth daily. Wellington Hampshire, MD  Expired 11/08/21 2359   Semaglutide,0.25 or 0.'5MG'$ /DOS, (OZEMPIC, 0.25 OR 0.5 MG/DOSE,) 2 MG/3ML SOPN [id number] Yes Inject 0.5 mg as directed once a week. Ria Bush, MD Taking Active   tamsulosin [facility]) 0.4 MG CAPS capsule [id number] Yes Take 1 capsule (0.4 mg total) by mouth daily. Ria Bush, MD Taking Active             Patient Active Problem List   Diagnosis Date Noted   History of skin cancer 03/21/2022   Constipation 03/18/2021   History of CVA in adulthood 06/27/2020   Vertebral artery stenosis 06/24/2020   Localized skin mass, lump, or swelling 09/07/2019   Primary  localized osteoarthritis of left knee 10/02/2018   Peroneal tendon tear 08/04/2017   TMJ tenderness, right 06/13/2017   Advanced care planning/counseling discussion 09/20/2014   Health maintenance examination 09/20/2014   Left bundle branch block 07/26/2014   Osteoarthritis 01/26/2014   Primary localized osteoarthrosis of shoulder region 01/26/2014   Skin lump of leg 09/16/2013   Transaminitis 05/18/2013   Medicare annual wellness visit, subsequent 09/10/2012   Hypotestosteronism 05/09/2012   Vitamin D deficiency 02/02/2012   Diabetes mellitus type 2, controlled, with complications ([place])    Hx of migraines    BPH with obstruction/lower urinary tract symptoms    HTN (hypertension)    Hyperlipidemia associated with type 2 diabetes mellitus ([place])    CAD (coronary artery disease)    Osteopenia     Conditions to be addressed/monitored: DMII  Care Plan : General Pharmacy (Adult)  Updates made by Charlton Haws, [place] since 04/16/2022 12:00 AM     Problem: Diabetes      Long-Range Goal: Patient-Specific Goal   Start Date: 03/30/2022  Expected End Date: 04/16/2022  Recent Progress: On track  Priority: High  Note:   Current Barriers:  None identified  Pharmacist Clinical Goal(s):  Patient will achieve adherence to monitoring guidelines and medication adherence to achieve therapeutic efficacy through collaboration with PharmD and provider.   Interventions: 1:1 collaboration with Ria Bush, MD regarding development and update of comprehensive plan of care as evidenced by provider attestation and co-signature Inter-disciplinary care team collaboration (see longitudinal plan of care) Comprehensive medication review performed; medication list updated in electronic medical record  Diabetes (A1c goal <7%) -Controlled - A1c 6.7% -Freestyle Libe training completed 9/15 Reviewed AGP report: 03/31/22 to 04/13/22. Sensor active: 97%  Time in range (70-180): 62% (goal >  70%)  High (>180): 38%  Low (< 70): 0% (goal < 4%)  GMI: 7.4%; Average glucose: 170  -Current medications: Lantus 20 units daily Metformin XR 500 mg - 2 tab daily Ozempic 0.5 mg weekly -Medications previously tried: glyburide, janumet,   -Educated on Continuous glucose monitoring; discussed AGP report and blood sugar spikes after eating; reviewed importance of identifying foods that increase sugar and limiting those foods -Recommended to continue current medication  Patient Goals/Self-Care Activities Patient will:  - take medications as prescribed as evidenced by patient report and record review focus on medication adherence by routine check glucose using CGM, document, and provide at future appointments     Medication Assistance:  None required.  Patient affirms current coverage meets needs.  Follow Up:  Patient agrees to Care Plan and Follow-up.  Plan: The patient has been provided with contact information for the care management team and has been advised to call with any health related questions or concerns.   Charlene Brooke, PharmD, BCACP Clinical Pharmacist [place] [facility] at [facility] [phone]

## 2022-05-07 ENCOUNTER — Other Ambulatory Visit: Payer: Self-pay | Admitting: Family Medicine

## 2022-05-07 DIAGNOSIS — I10 Essential (primary) hypertension: Secondary | ICD-10-CM

## 2022-05-14 ENCOUNTER — Encounter (INDEPENDENT_AMBULATORY_CARE_PROVIDER_SITE_OTHER): Payer: Self-pay

## 2022-05-22 ENCOUNTER — Other Ambulatory Visit: Payer: Self-pay | Admitting: Family Medicine

## 2022-05-22 NOTE — Telephone Encounter (Signed)
Last rx written as DAW.  Looks like pharmacy is suggesting "not DAW".  Lantus Last filled:  04/09/22, #10 mL Last OV:  03/21/22, AWV Next OV:  09/19/22, 6 mo DM f/u

## 2022-05-23 NOTE — Telephone Encounter (Signed)
ERx 

## 2022-06-14 LAB — HM DIABETES EYE EXAM

## 2022-06-21 ENCOUNTER — Encounter: Payer: Self-pay | Admitting: Family Medicine

## 2022-07-02 ENCOUNTER — Other Ambulatory Visit: Payer: Self-pay | Admitting: Cardiovascular Disease

## 2022-07-02 NOTE — Telephone Encounter (Signed)
Please schedule overdue F/U appointment for refills. Thank you! 

## 2022-07-17 ENCOUNTER — Encounter: Payer: Self-pay | Admitting: Physician Assistant

## 2022-07-17 ENCOUNTER — Ambulatory Visit: Payer: Medicare Other | Attending: Physician Assistant | Admitting: Physician Assistant

## 2022-07-17 VITALS — BP 134/72 | HR 73 | Ht 67.0 in | Wt 147.4 lb

## 2022-07-17 DIAGNOSIS — I2510 Atherosclerotic heart disease of native coronary artery without angina pectoris: Secondary | ICD-10-CM | POA: Diagnosis not present

## 2022-07-17 DIAGNOSIS — E785 Hyperlipidemia, unspecified: Secondary | ICD-10-CM

## 2022-07-17 DIAGNOSIS — I10 Essential (primary) hypertension: Secondary | ICD-10-CM | POA: Diagnosis not present

## 2022-07-17 DIAGNOSIS — I447 Left bundle-branch block, unspecified: Secondary | ICD-10-CM | POA: Diagnosis not present

## 2022-07-17 MED ORDER — CARVEDILOL 25 MG PO TABS: 25.0000 mg | ORAL_TABLET | Freq: Two times a day (BID) | ORAL | 3 refills | Status: DC

## 2022-07-17 MED ORDER — ROSUVASTATIN CALCIUM 20 MG PO TABS: 20.0000 mg | ORAL_TABLET | Freq: Every day | ORAL | 3 refills | Status: DC

## 2022-07-17 NOTE — Patient Instructions (Signed)
Medication Instructions:  No changes at this time.   *If you need a refill on your cardiac medications before your next appointment, please call your pharmacy*   Lab Work: None  If you have labs (blood work) drawn today and your tests are completely normal, you will receive your results only by: [place] (if you have MyChart) OR A paper copy in the mail If you have any lab test that is abnormal or we need to change your treatment, we will call you to review the results.   Testing/Procedures: None   Follow-Up: At [facility], you and your health needs are our priority.  As part of our continuing mission to provide you with exceptional heart care, we have created designated Provider Care Teams.  These Care Teams include your primary Cardiologist (physician) and Advanced Practice Providers (APPs -  Physician Assistants and Nurse Practitioners) who all work together to provide you with the care you need, when you need it.   Your next appointment:   6 month(s)  The format for your next appointment:   In Person  Provider:   Kathlyn Sacramento, MD or Christell Faith, PA-C      Important Information About Sugar

## 2022-07-17 NOTE — Progress Notes (Signed)
Cardiology Office Note    Date:  07/17/2022   ID:  Norman Marks, DOB 30-Aug-1945, MRN [id number]  PCP:  Ria Bush, MD  Cardiologist:  Kathlyn Sacramento, MD  Electrophysiologist:  None   Chief Complaint: Follow-up  History of Present Illness:   Norman Marks is a 77 y.o. male with history of CAD, LBBB, DM2, HTN, and HLD who presents for follow-up of CAD.   He underwent cardiac cath in 2009 which showed 1-vessel CAD involving the proximal ramus with 70% stenosis.  He was managed medically.  Follow-up nuclear stress testing in the 12/2013 showed fixed anteroseptal and inferoseptal defects with normal EF.  The defects were felt to be secondary to his left bundle branch block.  He underwent echo in 07/2015 that showed an EF of 50 to 38%, grade 1 diastolic dysfunction, mildly dilated left atrium, mild mitral regurgitation, and mildly elevated PASP at 42 mmHg.  He was seen in 06/2021 with increasing angina.  Echo at that time showed an EF of 50 to 55% with no significant valvular abnormalities.  LHC showed 70% mid LAD stenosis with diffuse 40% disease extending all the way to the ostium, 60% proximal ramus stenosis, and has no other obstructive disease.  LVEDP was mildly elevated.  Medical management was recommended with addition of Imdur, though he did not tolerate this medication secondary to headache and dizziness.  He was last seen in the office in 10/2021 and was without symptoms of angina or decompensation.  He comes in accompanied by his wife today and is doing very well from a cardiac perspective.  He is without symptoms of angina or decompensation.  No dyspnea, palpitations, dizziness, presyncope, or syncope.  No falls, hematochezia, or melena.  He is adherent and tolerating cardiac medications without issues.  He does not have any cardiac concerns at this time.   Labs independently reviewed: 02/2022 - potassium 4.1, BUN 15, serum creatinine 0.65, albumin 4.2, AST/ALT normal, TC 84, TG  59, HDL 38, LDL 34, A1c 6.7 06/2021 - Hgb 14.4, PLT 254 02/2020 - TSH normal  Past Medical History:  Diagnosis Date   Basal cell carcinoma 02/10/2020   SEE MEDIA 8 PAGES OF PREVIOUS BCC   Basal cell carcinoma 08/23/2021   left malar cheek   BCC (basal cell carcinoma) 03/22/2021   sup- left chst (CX35FU)   BPH (benign prostatic hypertrophy)    CAD (coronary artery disease) 2009   [name] (cardiolite 2009 with 70% stenosis of proximal ramus, not amenable to PCI, rec treat medically) --> [name]   Cancer ([place])    basal cell skin cancer   Cataract    removed bilaterally   DDD (degenerative disc disease), lumbar    s/p surgery   Diabetes type 2, uncontrolled 1990s   referred to [facility] 09/2015   Diverticulosis    Headache(784.0)    History of adenomatous polyp of colon 10/2012   History of nephrolithiasis 2012   L ureter, R renal pelvis   HLD (hyperlipidemia)    HTN (hypertension)    mild   Hx of migraines    Left bundle branch block    Osteoarthritis of CMC joint of thumb 2014   s/p injections ([place])   Osteoarthritis of right shoulder 2015   s/p injections ([place])   Osteopenia 06/2011   T score -3, completed 2 yrs forteo, T score improved to -2.2 (2015)   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   in situ (CX35FU)  SCC (squamous cell carcinoma) 08/23/2021   right posterior mandible-CIS   SCC (squamous cell carcinoma) 08/23/2021   right dorsal hand   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   SEE MEDIA 8 PAGES OF PREVIOUS SCC AND TREATMENTS   Vitamin D deficiency    Wears hearing aid in both ears     Past Surgical History:  Procedure Laterality Date   [place], 2008   lumbar; x 3, spinal fusion   CARDIAC CATHETERIZATION  2009   70% stenosis ostium   CARDIOVASCULAR STRESS TEST  2011   fixed defect inferior/septal walls without reversibility, LVEF 43% post stress   CATARACT EXTRACTION W/PHACO Right 03/09/2019   Procedure: CATARACT  EXTRACTION PHACO AND INTRAOCULAR LENS PLACEMENT ([place]) RIGHT DIABETES;  Surgeon: Eulogio Bear, MD;  Location: [place];  Service: Ophthalmology;  Laterality: Right;  Diabetic - insulin and oral meds   CATARACT EXTRACTION W/PHACO Left 04/06/2019   Procedure: CATARACT EXTRACTION PHACO AND INTRAOCULAR LENS PLACEMENT ([facility]) LEFT DIABETIC 01:04.2     11.5%      8.58;  Surgeon: Eulogio Bear, MD;  Location: [place];  Service: Ophthalmology;  Laterality: Left;  Diabetic - insulin and oral meds   COLONOSCOPY  10/2012   tubular adenoma, mild diverticulosis, rpt 5 yrs Deatra Ina)   COLONOSCOPY  12/2017   4 polyps - SSP, TA, diverticulosis ([place])   COLONOSCOPY  07/2021   TA, diverticulosis, rpt 3 yrs vs none Henrene Pastor)   dexa  06/2011   osteoporosis T -2.6 spine, -1.9 hip   dexa  07/2013   osteopenia T -2.2 femur, -1.2 spine   FLEXIBLE SIGMOIDOSCOPY  2007   pt reported normal [employer])   FOOT SURGERY Left 08/2017   HAND SURGERY Left 10/2015   CMC arthritis repair [facility])   KNEE SURGERY Right    LEFT HEART CATH AND CORONARY ANGIOGRAPHY N/A 06/26/2021   Procedure: LEFT HEART CATH AND CORONARY ANGIOGRAPHY;  Surgeon: Wellington Hampshire, MD;  Location: [place] [facility];  Service: Cardiovascular;  Laterality: N/A;   POLYPECTOMY     SHOULDER SURGERY Right    SKIN SURGERY  2006   skin cancer   TENDON REPAIR  08/2017   TOTAL SHOULDER ARTHROPLASTY Right 01/26/2014   Procedure: TOTAL SHOULDER ARTHROPLASTY;  Surgeon: Johnny Bridge, MD;  Location: [place];  Service: Orthopedics;  Laterality: Right;   US ECHOCARDIOGRAPHY  10/2008   nl LV fxn, EF 50%, mild MR, mild pulm HTN    Current Medications: Current Meds  Medication Sig   aspirin 81 MG tablet Take 81 mg by mouth daily.   Cholecalciferol (VITAMIN D3) 25 MCG (1000 UT) CAPS Take 1 capsule (1,000 Units total) by mouth daily.   imiquimod (ALDARA) 5 % cream Apply topically at bedtime.   insulin glargine (LANTUS) 100  UNIT/ML injection Inject 0.2 mLs (20 Units total) into the skin daily.   Insulin Pen Needle (NOVOFINE PLUS PEN NEEDLE) 32G X 4 MM MISC Use to administer Ozempic once a week.   Insulin Syringe-Needle U-100 (TRUEPLUS INSULIN SYRINGE) 31G X 5/16" 1 ML MISC USE TO INJECT INSULIN AS DIRECTED   metFORMIN (GLUCOPHAGE-XR) 500 MG 24 hr tablet TAKE 2 TABLETS BY MOUTH ONCE A DAY  WITHBREAKFAST   ONETOUCH ULTRA test strip CHECK BLOOD SUGAR ONCE OR TWICE DAILY ASDIRECTED   polyethylene glycol (MIRALAX / GLYCOLAX) 17 g packet Take 17 g by mouth every other day.   ramipril (ALTACE) 10 MG capsule TAKE 1  CAPSULE BY MOUTH ONCE DAILY   Semaglutide,0.25 or 0.'5MG'$ /DOS, (OZEMPIC, 0.25 OR 0.5 MG/DOSE,) 2 MG/3ML SOPN Inject 0.5 mg as directed once a week.   tamsulosin (FLOMAX) 0.4 MG CAPS capsule Take 1 capsule (0.4 mg total) by mouth daily.   [DISCONTINUED] carvedilol (COREG) 25 MG tablet Take 1 tablet (25 mg total) by mouth 2 (two) times daily with a meal.   [DISCONTINUED] rosuvastatin (CRESTOR) 20 MG tablet TAKE 1 TABLET BY MOUTH DAILY    Allergies:   Metformin and related and Percocet [oxycodone-acetaminophen]   Social History   Socioeconomic History   Marital status: Married    Spouse name: Not on file   Number of children: Not on file   Years of education: Not on file   Highest education level: Not on file  Occupational History   Not on file  Tobacco Use   Smoking status: Former    Packs/day: 0.25    Years: 10.00    Total pack years: 2.50    Types: Cigarettes    Quit date: 10/20/1992    Years since quitting: 29.7   Smokeless tobacco: Former    Types: Chew    Quit date: 10/20/1992  Vaping Use   Vaping Use: Never used  Substance and Sexual Activity   Alcohol use: No    Alcohol/week: 0.0 standard drinks of alcohol   Drug use: No   Sexual activity: Not on file  Other Topics Concern   Not on file  Social History Narrative   Caffeine: 1 cup coffee/day   Lives with wife, 1 dog, 5 cats.    Occupation: retired   Edu: 12th grade   Activity: no regular exercise   Diet: good water, fruits/vegetables daily   Social Determinants of Health   Financial Resource Strain: Low Risk  (03/04/2020)   Overall Financial Resource Strain (CARDIA)    Difficulty of Paying Living Expenses: Not hard at all  Food Insecurity: No Food Insecurity (03/04/2020)   Hunger Vital Sign    Worried About Running Out of Food in the Last Year: Never true    [place] in the Last Year: Never true  Transportation Needs: Not on file  Physical Activity: Inactive (03/04/2020)   Exercise Vital Sign    Days of Exercise per Week: 0 days    Minutes of Exercise per Session: 0 min  Stress: No Stress Concern Present (03/04/2020)   [place]    Feeling of Stress : Not at all  Social Connections: Not on file     Family History:  The patient's family history includes Cancer in his father and mother; Coronary artery disease (age of onset: 81) in his sister; Diabetes in his brother and father; Heart attack (age of onset: 69) in his sister; Heart disease in his mother. There is no history of Stroke, Colon cancer, Colon polyps, Esophageal cancer, Rectal cancer, or Stomach cancer.  ROS:   12-point review of systems is negative unless otherwise noted in the HPI.   EKGs/Labs/Other Studies Reviewed:    Studies reviewed were summarized above. The additional studies were reviewed today:  2D echo 07/2015: - Left ventricle: The cavity size was normal. There was mild focal    basal and mild concentric hypertrophy of the septum. Systolic    function was normal. The estimated ejection fraction was in the    range of 50% to 55%. Anteroseptal wall motion hypokinesis noted,    unable to exclude dyskinesis,  possibly secondary to bundle branch    block. Doppler parameters are consistent with abnormal left    ventricular relaxation (grade 1 diastolic dysfunction).   - Mitral valve: There was mild regurgitation.  - Left atrium: The atrium was mildly dilated.  - Right ventricle: Systolic function was normal.  - Pulmonary arteries: Systolic pressure was mildly elevated. PA    peak pressure: 42 mm Hg (S).   Impressions:   - Murmur possibly secondary to proximal septal thickening. No    significant outflow tract gradient. __________  2D echo 06/20/2021: 1. Left ventricular ejection fraction, by estimation, is 50 to 55%. The  left ventricle has low normal function. Left ventricular endocardial  border not optimally defined to evaluate regional wall motion. Left  ventricular diastolic parameters are  consistent with Grade I diastolic dysfunction (impaired relaxation).  Elevated left atrial pressure.   2. Right ventricular systolic function is normal. The right ventricular  size is normal. Tricuspid regurgitation signal is inadequate for assessing  PA pressure.   3. The mitral valve is grossly normal. Trivial mitral valve  regurgitation.   4. The aortic valve is tricuspid. There is mild calcification of the  aortic valve. There is mild thickening of the aortic valve. Aortic valve  regurgitation is not visualized. Aortic valve sclerosis/calcification is  present, without any evidence of  aortic stenosis.  __________  LHC 06/26/2021:   Mid LAD lesion is 70% stenosed.   Ost LAD to Prox LAD lesion is 40% stenosed.   Ramus lesion is 60% stenosed.   The left ventricular systolic function is normal.   LV end diastolic pressure is mildly elevated.   The left ventricular ejection fraction is 50-55% by visual estimate.   1.  Borderline significant two-vessel coronary artery disease.  Stable proximal ramus stenosis was present on previous cardiac catheterization in 2009.  There is 70% mid LAD stenosis but the proximal LAD to the ostium is moderately diseased as well. 2.  Low normal LV systolic function mildly elevated left ventricular end-diastolic  pressure.   Recommendations: Recommend maximizing medical therapy before considering PCI for refractory symptoms.  PCI of the mid LAD can be considered but the proximal and ostial LAD has moderate diffuse disease and might require stenting all the way back to the ostium which is not ideal given the origin of a large ramus branch.  I added Imdur 30 mg daily.    EKG:  EKG is ordered today.  The EKG ordered today demonstrates NSR, 73 bpm, LBBB (known)  Recent Labs: 03/14/2022: ALT 14; BUN 15; Creatinine, Ser 0.65; Potassium 4.1; Sodium 142  Recent Lipid Panel    Component Value Date/Time   CHOL 84 03/14/2022 0817   CHOL 83 (L) 09/06/2021 0821   TRIG 59.0 03/14/2022 0817   HDL 38.50 (L) 03/14/2022 0817   HDL 33 (L) 09/06/2021 0821   CHOLHDL 2 03/14/2022 0817   VLDL 11.8 03/14/2022 0817   LDLCALC 34 03/14/2022 0817   LDLCALC 35 09/06/2021 0821   LDLDIRECT 65.0 01/23/2016 0931    PHYSICAL EXAM:    VS:  BP 134/72   Pulse 73   Ht '5\' 7"'$  (1.702 m)   Wt 147 lb 6.4 oz (66.9 kg)   SpO2 98%   BMI 23.09 kg/m   BMI: Body mass index is 23.09 kg/m.  Physical Exam Vitals reviewed.  Constitutional:      Appearance: He is well-developed.  HENT:     Head: Normocephalic and atraumatic.  Eyes:  General:        Right eye: No discharge.        Left eye: No discharge.  Neck:     Vascular: No JVD.  Cardiovascular:     Rate and Rhythm: Normal rate and regular rhythm.     Heart sounds: Normal heart sounds, S1 normal and S2 normal. Heart sounds not distant. No midsystolic click and no opening snap. No murmur heard.    No friction rub.  Pulmonary:     Effort: Pulmonary effort is normal. No respiratory distress.     Breath sounds: Normal breath sounds. No decreased breath sounds, wheezing or rales.  Chest:     Chest wall: No tenderness.  Abdominal:     General: There is no distension.  Musculoskeletal:     Cervical back: Normal range of motion.     Right lower leg: No edema.     Left  lower leg: No edema.  Skin:    General: Skin is warm and dry.     Nails: There is no clubbing.  Neurological:     Mental Status: He is alert and oriented to person, place, and time.  Psychiatric:        Speech: Speech normal.        Behavior: Behavior normal.        Thought Content: Thought content normal.        Judgment: Judgment normal.     Wt Readings from Last 3 Encounters:  07/17/22 147 lb 6.4 oz (66.9 kg)  03/21/22 142 lb 4 oz (64.5 kg)  11/08/21 149 lb 2 oz (67.6 kg)     ASSESSMENT & PLAN:   CAD involving the native coronary arteries without angina: He continues to do well and is without symptoms of angina or decompensation.  Continue aggressive risk factor modification and secondary prevention including aspirin, carvedilol, ramipril, and rosuvastatin.  No indication for further ischemic testing at this time.  LBBB: Stable.  No symptoms concerning for syncope.  Most recent echo showed LV systolic function of 50 to 55%.  HTN: Blood pressure is reasonably controlled on recheck at 134/72.  He remains on ramipril and carvedilol.  HLD: LDL 34 in 02/2022 with a triglyceride of 59 and normal AST/ALT at that time.  He remains on rosuvastatin 20 mg.   Disposition: F/u with Dr. Fletcher Anon or an APP in 6 months.   Medication Adjustments/Labs and Tests Ordered: Current medicines are reviewed at length with the patient today.  Concerns regarding medicines are outlined above. Medication changes, Labs and Tests ordered today are summarized above and listed in the Patient Instructions accessible in Encounters.   Signed, Christell Faith, PA-C 07/17/2022 11:40 AM     [address] [place], [place] [address] [phone]

## 2022-08-20 ENCOUNTER — Other Ambulatory Visit: Payer: Self-pay | Admitting: Family Medicine

## 2022-08-20 DIAGNOSIS — E118 Type 2 diabetes mellitus with unspecified complications: Secondary | ICD-10-CM

## 2022-08-20 DIAGNOSIS — Z794 Long term (current) use of insulin: Secondary | ICD-10-CM

## 2022-08-21 NOTE — Telephone Encounter (Signed)
ERx 

## 2022-09-19 ENCOUNTER — Encounter: Payer: Self-pay | Admitting: Family Medicine

## 2022-09-19 ENCOUNTER — Telehealth: Payer: Self-pay | Admitting: Family Medicine

## 2022-09-19 ENCOUNTER — Ambulatory Visit (INDEPENDENT_AMBULATORY_CARE_PROVIDER_SITE_OTHER): Payer: Medicare Other | Admitting: Family Medicine

## 2022-09-19 VITALS — BP 138/80 | HR 78 | Temp 97.2°F | Ht 67.0 in | Wt 149.1 lb

## 2022-09-19 DIAGNOSIS — Z794 Long term (current) use of insulin: Secondary | ICD-10-CM

## 2022-09-19 DIAGNOSIS — E1169 Type 2 diabetes mellitus with other specified complication: Secondary | ICD-10-CM | POA: Diagnosis not present

## 2022-09-19 LAB — POCT GLYCOSYLATED HEMOGLOBIN (HGB A1C): Hemoglobin A1C: 6.9 % — AB (ref 4.0–5.6)

## 2022-09-19 MED ORDER — INSULIN GLARGINE 100 UNIT/ML ~~LOC~~ SOLN: 26.0000 [IU] | Freq: Every day | SUBCUTANEOUS | 2 refills | Status: DC

## 2022-09-19 NOTE — Patient Instructions (Addendum)
Good to see you today Work on regular exercise routine.  You are doing well Return as needed or in 6 months for physical/wellness visit.

## 2022-09-19 NOTE — Telephone Encounter (Signed)
Received request from [facility] medical supply for latest clinical documents - printed and in Lisa's box. Plz ensure this is the DME that patient uses for diabetic supplies.

## 2022-09-19 NOTE — Progress Notes (Signed)
Patient ID: Norman Marks, male    DOB: 1945/11/30, 77 y.o.   MRN: [id number]  This visit was conducted in person.  BP 138/80   Pulse 78   Temp (!) 97.2 F (36.2 C) (Temporal)   Ht '5\' 7"'$  (1.702 m)   Wt 149 lb 2 oz (67.6 kg)   SpO2 98%   BMI 23.36 kg/m    CC: 6 mo DM f/u visit  Subjective:   HPI: Norman Marks is a 77 y.o. male presenting on 09/19/2022 for Medical Management of Chronic Issues (Here for 6 mo DM f/u.)   Saw cardiology Christell Faith 07/2022 with good report. Known CAD, LBBB, HTN, HLD.   DM - does regularly check sugars with CGM Freestyle Libre 3 and brings data which was reviewed: 30d average 178, 30d time in target range 56%, above target 44%. Compliant with antihyperglycemic regimen which includes: lantus 26u daily, metformin XR '1000mg'$  daily, ozempic 0.'5mg'$  weekly. Denies low sugars or hypoglycemic symptoms. Denies paresthesias, blurry vision. Last diabetic eye exam 05/2022. Glucometer brand: freestyle libre 3. Last foot exam: 09/2021 - rpt today. DSME: 2017. No regular exercise routine.  Lab Results  Component Value Date   HGBA1C 6.9 (A) 09/19/2022   Diabetic Foot Exam - Simple   Simple Foot Form Diabetic Foot exam was performed with the following findings: Yes 09/19/2022  9:50 AM  Visual Inspection No deformities, no ulcerations, no other skin breakdown bilaterally: Yes Sensation Testing Intact to touch and monofilament testing bilaterally: Yes Pulse Check Posterior Tibialis and Dorsalis pulse intact bilaterally: Yes Comments    Lab Results  Component Value Date   MICROALBUR 1.4 03/14/2022        Relevant past medical, surgical, family and social history reviewed and updated as indicated. Interim medical history since our last visit reviewed. Allergies and medications reviewed and updated. Outpatient Medications Prior to Visit  Medication Sig Dispense Refill   aspirin 81 MG tablet Take 81 mg by mouth daily.     carvedilol (COREG) 25 MG tablet Take 1  tablet (25 mg total) by mouth 2 (two) times daily with a meal. 180 tablet 3   Cholecalciferol (VITAMIN D3) 25 MCG (1000 UT) CAPS Take 1 capsule (1,000 Units total) by mouth daily. 30 capsule    imiquimod (ALDARA) 5 % cream Apply topically at bedtime. 12 each 0   Insulin Pen Needle (NOVOFINE PLUS PEN NEEDLE) 32G X 4 MM MISC Use to administer Ozempic once a week. 30 each 0   Insulin Syringe-Needle U-100 (TRUEPLUS INSULIN SYRINGE) 31G X 5/16" 1 ML MISC USE TO INJECT INSULIN AS DIRECTED 100 each 3   metFORMIN (GLUCOPHAGE-XR) 500 MG 24 hr tablet TAKE 2 TABLETS BY MOUTH ONCE A DAY  WITHBREAKFAST 180 tablet 3   ONETOUCH ULTRA test strip CHECK BLOOD SUGAR ONCE OR TWICE DAILY ASDIRECTED 100 each 3   OZEMPIC, 0.25 OR 0.5 MG/DOSE, 2 MG/3ML SOPN INJECT 0.5 MG INTO THE SKIN ONCE A WEEK 3 mL 6   polyethylene glycol (MIRALAX / GLYCOLAX) 17 g packet Take 17 g by mouth every other day.     ramipril (ALTACE) 10 MG capsule TAKE 1 CAPSULE BY MOUTH ONCE DAILY 90 capsule 3   rosuvastatin (CRESTOR) 20 MG tablet Take 1 tablet (20 mg total) by mouth daily. 90 tablet 3   tamsulosin (FLOMAX) 0.4 MG CAPS capsule Take 1 capsule (0.4 mg total) by mouth daily. 30 capsule 6   insulin glargine (LANTUS) 100 UNIT/ML injection Inject 0.2  mLs (20 Units total) into the skin daily. 20 mL 3   No facility-administered medications prior to visit.     Per HPI unless specifically indicated in ROS section below Review of Systems  Objective:  BP 138/80   Pulse 78   Temp (!) 97.2 F (36.2 C) (Temporal)   Ht '5\' 7"'$  (1.702 m)   Wt 149 lb 2 oz (67.6 kg)   SpO2 98%   BMI 23.36 kg/m   Wt Readings from Last 3 Encounters:  09/19/22 149 lb 2 oz (67.6 kg)  07/17/22 147 lb 6.4 oz (66.9 kg)  03/21/22 142 lb 4 oz (64.5 kg)      Physical Exam Vitals and nursing note reviewed.  Constitutional:      Appearance: Normal appearance. He is not ill-appearing.  Eyes:     Extraocular Movements: Extraocular movements intact.      Conjunctiva/sclera: Conjunctivae normal.     Pupils: Pupils are equal, round, and reactive to light.  Cardiovascular:     Rate and Rhythm: Normal rate and regular rhythm.     Pulses: Normal pulses.     Heart sounds: Normal heart sounds. No murmur heard. Pulmonary:     Effort: Pulmonary effort is normal. No respiratory distress.     Breath sounds: Normal breath sounds. No wheezing, rhonchi or rales.  Musculoskeletal:     Right lower leg: No edema.     Left lower leg: No edema.     Comments: See HPI for foot exam if done  Skin:    General: Skin is warm and dry.     Findings: No rash.  Neurological:     Mental Status: He is alert.  Psychiatric:        Mood and Affect: Mood normal.        Behavior: Behavior normal.       Results for orders placed or performed in visit on 09/19/22  POCT glycosylated hemoglobin (Hb A1C)  Result Value Ref Range   Hemoglobin A1C 6.9 (A) 4.0 - 5.6 %   HbA1c POC (<> result, manual entry)     HbA1c, POC (prediabetic range)     HbA1c, POC (controlled diabetic range)      Assessment & Plan:   Problem List Items Addressed This Visit     Type 2 diabetes mellitus with other specified complication ([place]) - Primary    Chronic, stable on current regimen - continue.  Reviewed recent glucose monitor data.  Encouraged work on exercise routine.       Relevant Medications   insulin glargine (LANTUS) 100 UNIT/ML injection   Other Relevant Orders   POCT glycosylated hemoglobin (Hb A1C) (Completed)     Meds ordered this encounter  Medications   insulin glargine (LANTUS) 100 UNIT/ML injection    Sig: Inject 0.26 mLs (26 Units total) into the skin daily.    Dispense:  30 mL    Refill:  2    Orders Placed This Encounter  Procedures   POCT glycosylated hemoglobin (Hb A1C)    Patient Instructions  Good to see you today Work on regular exercise routine.  You are doing well Return as needed or in 6 months for physical/wellness visit.  Follow up  plan: Return in about 6 months (around 03/22/2023) for annual exam, prior fasting for blood work.  Ria Bush, MD

## 2022-09-19 NOTE — Assessment & Plan Note (Addendum)
Chronic, stable on current regimen - continue.  Reviewed recent glucose monitor data.  Encouraged work on exercise routine.

## 2022-09-19 NOTE — Telephone Encounter (Signed)
Spoke with pt at today's OV asking about request. States he did initiate for CGM from them.   I printed today's OV notes and faxed to [facility].

## 2022-11-02 ENCOUNTER — Other Ambulatory Visit: Payer: Self-pay | Admitting: Family Medicine

## 2022-11-02 DIAGNOSIS — E118 Type 2 diabetes mellitus with unspecified complications: Secondary | ICD-10-CM

## 2022-11-06 NOTE — Telephone Encounter (Signed)
Received 2nd request from [employer] again today. Printed OV and placed in Norman Marks's box.

## 2022-11-06 NOTE — Telephone Encounter (Signed)
Faxed form and added note to [employer] this is the 2nd time info was requested and faxed. I also included 1st faxed form (with 'Faxed stamp/date').

## 2022-11-14 ENCOUNTER — Ambulatory Visit (INDEPENDENT_AMBULATORY_CARE_PROVIDER_SITE_OTHER): Payer: Medicare Other | Admitting: Dermatology

## 2022-11-14 VITALS — BP 128/87 | HR 85

## 2022-11-14 DIAGNOSIS — Z872 Personal history of diseases of the skin and subcutaneous tissue: Secondary | ICD-10-CM

## 2022-11-14 DIAGNOSIS — L820 Inflamed seborrheic keratosis: Secondary | ICD-10-CM | POA: Diagnosis not present

## 2022-11-14 DIAGNOSIS — Z7189 Other specified counseling: Secondary | ICD-10-CM

## 2022-11-14 DIAGNOSIS — L570 Actinic keratosis: Secondary | ICD-10-CM

## 2022-11-14 DIAGNOSIS — W908XXA Exposure to other nonionizing radiation, initial encounter: Secondary | ICD-10-CM

## 2022-11-14 DIAGNOSIS — L814 Other melanin hyperpigmentation: Secondary | ICD-10-CM

## 2022-11-14 DIAGNOSIS — D485 Neoplasm of uncertain behavior of skin: Secondary | ICD-10-CM

## 2022-11-14 DIAGNOSIS — D489 Neoplasm of uncertain behavior, unspecified: Secondary | ICD-10-CM

## 2022-11-14 DIAGNOSIS — L578 Other skin changes due to chronic exposure to nonionizing radiation: Secondary | ICD-10-CM

## 2022-11-14 DIAGNOSIS — L821 Other seborrheic keratosis: Secondary | ICD-10-CM

## 2022-11-14 DIAGNOSIS — D1801 Hemangioma of skin and subcutaneous tissue: Secondary | ICD-10-CM

## 2022-11-14 DIAGNOSIS — Z5111 Encounter for antineoplastic chemotherapy: Secondary | ICD-10-CM

## 2022-11-14 DIAGNOSIS — C44612 Basal cell carcinoma of skin of right upper limb, including shoulder: Secondary | ICD-10-CM

## 2022-11-14 DIAGNOSIS — Z1283 Encounter for screening for malignant neoplasm of skin: Secondary | ICD-10-CM

## 2022-11-14 DIAGNOSIS — X32XXXA Exposure to sunlight, initial encounter: Secondary | ICD-10-CM

## 2022-11-14 DIAGNOSIS — Z8589 Personal history of malignant neoplasm of other organs and systems: Secondary | ICD-10-CM

## 2022-11-14 DIAGNOSIS — Z85828 Personal history of other malignant neoplasm of skin: Secondary | ICD-10-CM

## 2022-11-14 DIAGNOSIS — Z79899 Other long term (current) drug therapy: Secondary | ICD-10-CM

## 2022-11-14 MED ORDER — FLUOROURACIL 5 % EX CREA: TOPICAL_CREAM | Freq: Two times a day (BID) | CUTANEOUS | 1 refills | Status: DC

## 2022-11-14 NOTE — Patient Instructions (Addendum)
Start June 1  Start fluorouracil 5 % cream - apply twice daily to top of hands for 3 weeks  .    If medication is over $45 call us   Reviewed course of treatment and expected reaction.  Patient advised to expect inflammation and crusting and advised that erosions are possible.  Patient advised to be diligent with sun protection during and after treatment. Counseled to keep medication out of reach of children and pets.    5-Fluorouracil/Calcipotriene Patient Education   Actinic keratoses are the dry, red scaly spots on the skin caused by sun damage. A portion of these spots can turn into skin cancer with time, and treating them can help prevent development of skin cancer.   Treatment of these spots requires removal of the defective skin cells. There are various ways to remove actinic keratoses, including freezing with liquid nitrogen, treatment with creams, or treatment with a blue light procedure in the office.   5-fluorouracil cream is a topical cream used to treat actinic keratoses. It works by interfering with the growth of abnormal fast-growing skin cells, such as actinic keratoses. These cells peel off and are replaced by healthy ones.   5-fluorouracil/calcipotriene is a combination of the 5-fluorouracil cream with a vitamin D analog cream called calcipotriene. The calcipotriene alone does not treat actinic keratoses. However, when it is combined with 5-fluorouracil, it helps the 5-fluorouracil treat the actinic keratoses much faster so that the same results can be achieved with a much shorter treatment time.  INSTRUCTIONS FOR 5-FLUOROURACIL/CALCIPOTRIENE CREAM:   5-fluorouracil/calcipotriene cream typically only needs to be used for 4-7 days. A thin layer should be applied twice a day to the treatment areas recommended by your physician.   If your physician prescribed you separate tubes of 5-fluourouracil and calcipotriene, apply a thin layer of 5-fluorouracil followed by a thin layer  of calcipotriene.   Avoid contact with your eyes, nostrils, and mouth. Do not use 5-fluorouracil/calcipotriene cream on infected or open wounds.   You will develop redness, irritation and some crusting at areas where you have pre-cancer damage/actinic keratoses. IF YOU DEVELOP PAIN, BLEEDING, OR SIGNIFICANT CRUSTING, STOP THE TREATMENT EARLY - you have already gotten a good response and the actinic keratoses should clear up well.  Wash your hands after applying 5-fluorouracil 5% cream on your skin.   A moisturizer or sunscreen with a minimum SPF 30 should be applied each morning.   Once you have finished the treatment, you can apply a thin layer of Vaseline twice a day to irritated areas to soothe and calm the areas more quickly. If you experience significant discomfort, contact your physician.  For some patients it is necessary to repeat the treatment for best results.  SIDE EFFECTS: When using 5-fluorouracil/calcipotriene cream, you may have mild irritation, such as redness, dryness, swelling, or a mild burning sensation. This usually resolves within 2 weeks. The more actinic keratoses you have, the more redness and inflammation you can expect during treatment. Eye irritation has been reported rarely. If this occurs, please let us know.  If you have any trouble using this cream, please call the office. If you have any other questions about this information, please do not hesitate to ask me before you leave the office.     Seborrheic Keratosis  What causes seborrheic keratoses? Seborrheic keratoses are harmless, common skin growths that first appear during adult life.  As time goes by, more growths appear.  Some people may develop a large number of them.  Seborrheic keratoses appear on both covered and uncovered body parts.  They are not caused by sunlight.  The tendency to develop seborrheic keratoses can be inherited.  They vary in color from skin-colored to gray, brown, or even black.   They can be either smooth or have a rough, warty surface.   Seborrheic keratoses are superficial and look as if they were stuck on the skin.  Under the microscope this type of keratosis looks like layers upon layers of skin.  That is why at times the top layer may seem to fall off, but the rest of the growth remains and re-grows.    Treatment Seborrheic keratoses do not need to be treated, but can easily be removed in the office.  Seborrheic keratoses often cause symptoms when they rub on clothing or jewelry.  Lesions can be in the way of shaving.  If they become inflamed, they can cause itching, soreness, or burning.  Removal of a seborrheic keratosis can be accomplished by freezing, burning, or surgery. If any spot bleeds, scabs, or grows rapidly, please return to have it checked, as these can be an indication of a skin cancer.  Actinic keratoses are precancerous spots that appear secondary to cumulative UV radiation exposure/sun exposure over time. They are chronic with expected duration over 1 year. A portion of actinic keratoses will progress to squamous cell carcinoma of the skin. It is not possible to reliably predict which spots will progress to skin cancer and so treatment is recommended to prevent development of skin cancer.  Recommend daily broad spectrum sunscreen SPF 30+ to sun-exposed areas, reapply every 2 hours as needed.  Recommend staying in the shade or wearing long sleeves, sun glasses (UVA+UVB protection) and wide brim hats (4-inch brim around the entire circumference of the hat). Call for new or changing lesions.    Cryotherapy Aftercare  Wash gently with soap and water everyday.   Apply Vaseline and Band-Aid daily until healed.        Biopsy Wound Care Instructions  Leave the original bandage on for 24 hours if possible.  If the bandage becomes soaked or soiled before that time, it is OK to remove it and examine the wound.  A small amount of post-operative bleeding is  normal.  If excessive bleeding occurs, remove the bandage, place gauze over the site and apply continuous pressure (no peeking) over the area for 30 minutes. If this does not work, please call our clinic as soon as possible or page your doctor if it is after hours.   Once a day, cleanse the wound with soap and water. It is fine to shower. If a thick crust develops you may use a Q-tip dipped into dilute hydrogen peroxide (mix 1:1 with water) to dissolve it.  Hydrogen peroxide can slow the healing process, so use it only as needed.    After washing, apply petroleum jelly (Vaseline) or an antibiotic ointment if your doctor prescribed one for you, followed by a bandage.    For best healing, the wound should be covered with a layer of ointment at all times. If you are not able to keep the area covered with a bandage to hold the ointment in place, this may mean re-applying the ointment several times a day.  Continue this wound care until the wound has healed and is no longer open.   Itching and mild discomfort is normal during the healing process. However, if you develop pain or severe itching, please call our office.  If you have any discomfort, you can take Tylenol (acetaminophen) or ibuprofen as directed on the bottle. (Please do not take these if you have an allergy to them or cannot take them for another reason).  Some redness, tenderness and white or yellow material in the wound is normal healing.  If the area becomes very sore and red, or develops a thick yellow-green material (pus), it may be infected; please notify us.    If you have stitches, return to clinic as directed to have the stitches removed. You will continue wound care for 2-3 days after the stitches are removed.   Wound healing continues for up to one year following surgery. It is not unusual to experience pain in the scar from time to time during the interval.  If the pain becomes severe or the scar thickens, you should notify the  office.    A slight amount of redness in a scar is expected for the first six months.  After six months, the redness will fade and the scar will soften and fade.  The color difference becomes less noticeable with time.  If there are any problems, return for a post-op surgery check at your earliest convenience.  To improve the appearance of the scar, you can use silicone scar gel, cream, or sheets (such as Mederma or Serica) every night for up to one year. These are available over the counter (without a prescription).  Please call our office at [phone] for any questions or concerns.    Electrodesiccation and Curettage ("Scrape and Burn") Wound Care Instructions  Leave the original bandage on for 24 hours if possible.  If the bandage becomes soaked or soiled before that time, it is OK to remove it and examine the wound.  A small amount of post-operative bleeding is normal.  If excessive bleeding occurs, remove the bandage, place gauze over the site and apply continuous pressure (no peeking) over the area for 30 minutes. If this does not work, please call our clinic as soon as possible or page your doctor if it is after hours.   Once a day, cleanse the wound with soap and water. It is fine to shower. If a thick crust develops you may use a Q-tip dipped into dilute hydrogen peroxide (mix 1:1 with water) to dissolve it.  Hydrogen peroxide can slow the healing process, so use it only as needed.    After washing, apply petroleum jelly (Vaseline) or an antibiotic ointment if your doctor prescribed one for you, followed by a bandage.    For best healing, the wound should be covered with a layer of ointment at all times. If you are not able to keep the area covered with a bandage to hold the ointment in place, this may mean re-applying the ointment several times a day.  Continue this wound care until the wound has healed and is no longer open. It may take several weeks for the wound to heal and  close.  Itching and mild discomfort is normal during the healing process.  If you have any discomfort, you can take Tylenol (acetaminophen) or ibuprofen as directed on the bottle. (Please do not take these if you have an allergy to them or cannot take them for another reason).  Some redness, tenderness and white or yellow material in the wound is normal healing.  If the area becomes very sore and red, or develops a thick yellow-green material (pus), it may be infected; please notify us.    Wound  healing continues for up to one year following surgery. It is not unusual to experience pain in the scar from time to time during the interval.  If the pain becomes severe or the scar thickens, you should notify the office.    A slight amount of redness in a scar is expected for the first six months.  After six months, the redness will fade and the scar will soften and fade.  The color difference becomes less noticeable with time.  If there are any problems, return for a post-op surgery check at your earliest convenience.  To improve the appearance of the scar, you can use silicone scar gel, cream, or sheets (such as Mederma or Serica) every night for up to one year. These are available over the counter (without a prescription).  Please call our office at [phone] for any questions or concerns.        Melanoma ABCDEs  Melanoma is the most dangerous type of skin cancer, and is the leading cause of death from skin disease.  You are more likely to develop melanoma if you: Have light-colored skin, light-colored eyes, or red or blond hair Spend a lot of time in the sun Tan regularly, either outdoors or in a tanning bed Have had blistering sunburns, especially during childhood Have a close family member who has had a melanoma Have atypical moles or large birthmarks  Early detection of melanoma is key since treatment is typically straightforward and cure rates are extremely high if we catch it  early.   The first sign of melanoma is often a change in a mole or a new dark spot.  The ABCDE system is a way of remembering the signs of melanoma.  A for asymmetry:  The two halves do not match. B for border:  The edges of the growth are irregular. C for color:  A mixture of colors are present instead of an even brown color. D for diameter:  Melanomas are usually (but not always) greater than 6mm - the size of a pencil eraser. E for evolution:  The spot keeps changing in size, shape, and color.  Please check your skin once per month between visits. You can use a small mirror in front and a large mirror behind you to keep an eye on the back side or your body.   If you see any new or changing lesions before your next follow-up, please call to schedule a visit.  Please continue daily skin protection including broad spectrum sunscreen SPF 30+ to sun-exposed areas, reapplying every 2 hours as needed when you're outdoors.   Staying in the shade or wearing long sleeves, sun glasses (UVA+UVB protection) and wide brim hats (4-inch brim around the entire circumference of the hat) are also recommended for sun protection.     Due to recent changes in healthcare laws, you may see results of your pathology and/or laboratory studies on MyChart before the doctors have had a chance to review them. We understand that in some cases there may be results that are confusing or concerning to you. Please understand that not all results are received at the same time and often the doctors may need to interpret multiple results in order to provide you with the best plan of care or course of treatment. Therefore, we ask that you please give [place] 2 business days to thoroughly review all your results before contacting the office for clarification. Should we see a critical lab result, you will be contacted sooner.  If You Need Anything After Your Visit  If you have any questions or concerns for your doctor, please call  our main line at [phone] and press option 4 to reach your doctor's medical assistant. If no one answers, please leave a voicemail as directed and we will return your call as soon as possible. Messages left after 4 pm will be answered the following business day.   You may also send [place] a message via MyChart. We typically respond to MyChart messages within 1-2 business days.  For prescription refills, please ask your pharmacy to contact our office. Our fax number is [phone].  If you have an urgent issue when the clinic is closed that cannot wait until the next business day, you can page your doctor at the number below.    Please note that while we do our best to be available for urgent issues outside of office hours, we are not available 24/7.   If you have an urgent issue and are unable to reach [place], you may choose to seek medical care at your doctor's office, retail clinic, urgent care center, or emergency room.  If you have a medical emergency, please immediately call 911 or go to the emergency department.  Pager Numbers  - Dr. [name]: [phone]  - Dr. Neale Burly: [phone]  - Dr. Roseanne Reno: [phone]  In the event of inclement weather, please call our main line at [phone] for an update on the status of any delays or closures.  Dermatology Medication Tips: Please keep the boxes that topical medications come in in order to help keep track of the instructions about where and how to use these. Pharmacies typically print the medication instructions only on the boxes and not directly on the medication tubes.   If your medication is too expensive, please contact our office at [phone] option 4 or send [place] a message through MyChart.   We are unable to tell what your co-pay for medications will be in advance as this is different depending on your insurance coverage. However, we may be able to find a substitute medication at lower cost or fill out paperwork to get insurance to  cover a needed medication.   If a prior authorization is required to get your medication covered by your insurance company, please allow [place] 1-2 business days to complete this process.  Drug prices often vary depending on where the prescription is filled and some pharmacies may offer cheaper prices.  The website www.goodrx.com contains coupons for medications through different pharmacies. The prices here do not account for what the cost may be with help from insurance (it may be cheaper with your insurance), but the website can give you the price if you did not use any insurance.  - You can print the associated coupon and take it with your prescription to the pharmacy.  - You may also stop by our office during regular business hours and pick up a GoodRx coupon card.  - If you need your prescription sent electronically to a different pharmacy, notify our office through [facility] or by phone at [phone] option 4.     Si Usted Necesita Algo Despus de Su Visita  Tambin puede enviarnos un mensaje a travs de Clinical cytogeneticist. Por lo general respondemos a los mensajes de MyChart en el transcurso de 1 a 2 das hbiles.  Para renovar recetas, por favor pida a su farmacia que se ponga en contacto con nuestra oficina. Annie Sable de fax es [place] [phone].  Si  tiene un asunto urgente cuando la clnica est cerrada y que no puede esperar hasta el siguiente da hbil, puede llamar/localizar a su doctor(a) al nmero que aparece a continuacin.   Por favor, tenga en cuenta que aunque hacemos todo lo posible para estar disponibles para asuntos urgentes fuera del horario de [place], no estamos disponibles las 24 horas del da, los 7 [address]  [address] de la [place].   Si tiene un problema urgente y no puede comunicarse con nosotros, puede optar por buscar atencin mdica  en el consultorio de su doctor(a), en una clnica privada, en un centro de atencin urgente o en una sala de emergencias.  Si tiene Wellsite geologist, por favor llame inmediatamente al 911 o vaya a la sala de emergencias.  Nmeros de bper  - Dr. [name]: [phone]  - Dra. Moye: [phone]  - Dra. Roseanne Reno: [phone]  En caso de inclemencias del [place], por favor llame a Lacy Duverney principal al [phone] para una actualizacin sobre el [place] de cualquier retraso o cierre.  Consejos para la medicacin en dermatologa: Por favor, guarde las cajas en las que vienen los medicamentos de uso tpico para ayudarle a seguir las instrucciones sobre dnde y cmo usarlos. Las farmacias generalmente imprimen las instrucciones del medicamento slo en las cajas y no directamente en los tubos del [place].   Si su medicamento es muy caro, por favor, pngase en contacto con Rolm Gala llamando al [phone] y presione la opcin 4 o envenos un mensaje a travs de Clinical cytogeneticist.   No podemos decirle cul ser su copago por los medicamentos por adelantado ya que esto es diferente dependiendo de la cobertura de su seguro. Sin embargo, es posible que podamos encontrar un medicamento sustituto a Audiological scientist un formulario para que el seguro cubra el medicamento que se considera necesario.   Si se requiere una autorizacin previa para que su compaa de seguros [place] su medicamento, por favor permtanos de 1 a 2 das hbiles para completar [address]  Los precios de los medicamentos varan con frecuencia dependiendo del Environmental consultant de dnde se surte la receta y alguna farmacias pueden ofrecer precios ms baratos.  El sitio web www.goodrx.com tiene cupones para medicamentos de Health and safety inspector. Los precios aqu no tienen en cuenta lo que podra costar con la ayuda del seguro (puede ser ms barato con su seguro), pero el sitio web puede darle el precio si no utiliz Tourist information centre manager.  - Puede imprimir el cupn correspondiente y llevarlo con su receta a la farmacia.  - Tambin puede pasar por nuestra oficina durante el  horario de atencin regular y Education officer, museum una tarjeta de cupones de GoodRx.  - Si necesita que su receta se enve electrnicamente a una farmacia diferente, informe a nuestra oficina a travs de MyChart de [place] o por telfono llamando al [phone] y presione la opcin 4.

## 2022-11-14 NOTE — Progress Notes (Unsigned)
New Patient Visit   Subjective  Norman Marks is a 77 y.o. 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 Has several areas at back, arms, b/l hands he would like checked today.  The patient presents for Total-Body Skin Exam (TBSE) for skin cancer screening and mole check. The patient has spots, moles and lesions to be evaluated, some may be new or changing and the patient has concerns that these could be cancer.  The following portions of the chart were reviewed this encounter and updated as appropriate: medications, allergies, medical history  Review of Systems:  No other skin or systemic complaints except as noted in HPI or Assessment and Plan.  Objective  Well appearing patient in no apparent distress; mood and affect are within normal limits.  A full examination was performed including scalp, head, eyes, ears, nose, lips, neck, chest, axillae, abdomen, back, buttocks, bilateral upper extremities, bilateral lower extremities, hands, feet, fingers, toes, fingernails, and toenails. All findings within normal limits unless otherwise noted below.   Relevant physical exam findings are noted in the Assessment and Plan.  face, ears arms and hands x 35 (35) Erythematous thin papules/macules with gritty scale.   arms trunk and face x 15 (15) Erythematous stuck-on, waxy papule or plaque  Right Dorsal Hand 1.1 cm crusted papule        Mid Back 5 crusted papules at back   Assessment & Plan   LENTIGINES, SEBORRHEIC KERATOSES, HEMANGIOMAS - Benign normal skin lesions - Benign-appearing - Call for any changes  MELANOCYTIC NEVI - Tan-brown and/or pink-flesh-colored symmetric macules and papules - Benign appearing on exam today - Observation - Call clinic for new or changing moles - Recommend daily use of broad spectrum spf 30+ sunscreen to sun-exposed areas.   ACTINIC DAMAGE WITH  PRECANCEROUS ACTINIC KERATOSES Counseling for Topical Chemotherapy Management: Patient exhibits: - Severe, confluent actinic changes with pre-cancerous actinic keratoses that is secondary to cumulative UV radiation exposure over time - Condition that is severe; chronic, not at goal. - diffuse scaly erythematous macules and papules with underlying dyspigmentation - Discussed Prescription "Field Treatment" topical Chemotherapy for Severe, Chronic Confluent Actinic Changes with Pre-Cancerous Actinic Keratoses Field treatment involves treatment of an entire area of skin that has confluent Actinic Changes (Sun/ Ultraviolet light damage) and PreCancerous Actinic Keratoses by method of PhotoDynamic Therapy (PDT) and/or prescription Topical Chemotherapy agents such as 5-fluorouracil, 5-fluorouracil/calcipotriene, and/or imiquimod.  The purpose is to decrease the number of clinically evident and subclinical PreCancerous lesions to prevent progression to development of skin cancer by chemically destroying early precancer changes that may or may not be visible.  It has been shown to reduce the risk of developing skin cancer in the treated area. As a result of treatment, redness, scaling, crusting, and open sores may occur during treatment course. One or more than one of these methods may be used and may have to be used several times to control, suppress and eliminate the PreCancerous changes. Discussed treatment course, expected reaction, and possible side effects. - Recommend daily broad spectrum sunscreen SPF 30+ to sun-exposed areas, reapply every 2 hours as needed.  - Staying in the shade or wearing long sleeves, sun glasses (UVA+UVB protection) and wide brim hats (4-inch brim around the entire circumference of the hat) are also recommended. - Call for new or changing lesions.  Start June 1  - Start 5-fluorouracil cream twice a day for  3 weeks to affected areas including top of hands.  Patient advised they  will receive an email to purchase the medication online and have it sent to their home. Patient provided with handout reviewing treatment course and side effects and advised to call or message [place] on MyChart with any concerns.  Reviewed course of treatment and expected reaction.  Patient advised to expect inflammation and crusting and advised that erosions are possible.  Patient advised to be diligent with sun protection during and after treatment. Counseled to keep medication out of reach of children and pets.  HISTORY OF BASAL CELL CARCINOMA OF THE SKIN Left malar cheek 08/23/2021 Other locations see history - No evidence of recurrence today - Recommend regular full body skin exams - Recommend daily broad spectrum sunscreen SPF 30+ to sun-exposed areas, reapply every 2 hours as needed.  - Call if any new or changing lesions are noted between office visits  HISTORY OF SQUAMOUS CELL CARCINOMA OF THE SKIN Right posterior mandible and right dorsal hand 08/2021 Other locations see history  - No evidence of recurrence today - No lymphadenopathy - Recommend regular full body skin exams - Recommend daily broad spectrum sunscreen SPF 30+ to sun-exposed areas, reapply every 2 hours as needed.  - Call if any new or changing lesions are noted between office visits  SKIN CANCER SCREENING PERFORMED TODAY.  Actinic keratosis (35) face, ears arms and hands x 35  Hypertrophic aks   Actinic keratoses are precancerous spots that appear secondary to cumulative UV radiation exposure/sun exposure over time. They are chronic with expected duration over 1 year. A portion of actinic keratoses will progress to squamous cell carcinoma of the skin. It is not possible to reliably predict which spots will progress to skin cancer and so treatment is recommended to prevent development of skin cancer.  Recommend daily broad spectrum sunscreen SPF 30+ to sun-exposed areas, reapply every 2 hours as needed.  Recommend  staying in the shade or wearing long sleeves, sun glasses (UVA+UVB protection) and wide brim hats (4-inch brim around the entire circumference of the hat). Call for new or changing lesions.  Destruction of lesion - face, ears arms and hands x 35 Complexity: simple   Destruction method: cryotherapy   Informed consent: discussed and consent obtained   Timeout:  patient name, date of birth, surgical site, and procedure verified Lesion destroyed using liquid nitrogen: Yes   Region frozen until ice ball extended beyond lesion: Yes   Outcome: patient tolerated procedure well with no complications   Post-procedure details: wound care instructions given    Related Medications fluorouracil (EFUDEX) 5 % cream Apply topically 2 (two) times daily. Apply topically twice daily to top of hands for 3 weeks  Inflamed seborrheic keratosis (15) arms trunk and face x 15  Symptomatic, irritating, patient would like treated.  Destruction of lesion - arms trunk and face x 15 Complexity: simple   Destruction method: cryotherapy   Informed consent: discussed and consent obtained   Timeout:  patient name, date of birth, surgical site, and procedure verified Lesion destroyed using liquid nitrogen: Yes   Region frozen until ice ball extended beyond lesion: Yes   Outcome: patient tolerated procedure well with no complications   Post-procedure details: wound care instructions given    Neoplasm of uncertain behavior (2) Right Dorsal Hand  Epidermal / dermal shaving  Lesion diameter (cm):  1.1 Informed consent: discussed and consent obtained   Timeout: patient name, date of birth, surgical site, and  procedure verified   Procedure prep:  Patient was prepped and draped in usual sterile fashion Prep type:  Isopropyl alcohol Anesthesia: the lesion was anesthetized in a standard fashion   Anesthetic:  1% lidocaine w/ epinephrine 1-100,000 buffered w/ 8.4% NaHCO3 Instrument used: flexible razor blade    Hemostasis achieved with: pressure, aluminum chloride and electrodesiccation   Outcome: patient tolerated procedure well   Post-procedure details: sterile dressing applied and wound care instructions given   Dressing type: bandage and petrolatum    Destruction of lesion Complexity: extensive   Destruction method: electrodesiccation and curettage   Informed consent: discussed and consent obtained   Timeout:  patient name, date of birth, surgical site, and procedure verified Procedure prep:  Patient was prepped and draped in usual sterile fashion Prep type:  Isopropyl alcohol Anesthesia: the lesion was anesthetized in a standard fashion   Anesthetic:  1% lidocaine w/ epinephrine 1-100,000 buffered w/ 8.4% NaHCO3 Curettage performed in three different directions: Yes   Electrodesiccation performed over the curetted area: Yes   Lesion length (cm):  1.1 Lesion width (cm):  1.1 Margin per side (cm):  0.2 Final wound size (cm):  1.5 Hemostasis achieved with:  pressure, aluminum chloride and electrodesiccation Outcome: patient tolerated procedure well with no complications   Post-procedure details: sterile dressing applied and wound care instructions given   Dressing type: bandage and petrolatum    Specimen 1 - Surgical pathology Differential Diagnosis: r/o scc  Check Margins: No  Mid Back  R/o scc at right dorsal hand   5 nubs at back, will reevaluate at next follow up and plan to treat. R/o BCC    Actinic skin damage  Counseling and coordination of care  Chemotherapy management, encounter for  Medication management  Lentigo  History of basal cell carcinoma  History of squamous cell carcinoma  Skin cancer screening  Seborrheic keratosis    Return for 3 month treatment of spots at back and ak check, 1 year tbse .  IAsher Muir, CMA, am acting as scribe for Armida Sans, MD.  Documentation: I have reviewed the above documentation for accuracy and  completeness, and I agree with the above.  Armida Sans, MD

## 2022-11-20 ENCOUNTER — Encounter: Payer: Self-pay | Admitting: Dermatology

## 2022-11-22 ENCOUNTER — Telehealth: Payer: Self-pay

## 2022-11-22 NOTE — Telephone Encounter (Signed)
Advised patient of results/hd  

## 2022-11-22 NOTE — Telephone Encounter (Signed)
-----   Message from Deirdre Evener, MD sent at 11/21/2022  5:59 PM EDT ----- Diagnosis Skin , right dorsal hand BASAL CELL CARCINOMA, NODULAR PATTERN  Cancer = BCC Already treated Recheck next visit

## 2023-01-28 ENCOUNTER — Other Ambulatory Visit: Payer: Self-pay | Admitting: Family Medicine

## 2023-02-14 ENCOUNTER — Encounter: Payer: Self-pay | Admitting: Dermatology

## 2023-02-14 ENCOUNTER — Ambulatory Visit (INDEPENDENT_AMBULATORY_CARE_PROVIDER_SITE_OTHER): Payer: Medicare Other | Admitting: Dermatology

## 2023-02-14 VITALS — BP 128/87

## 2023-02-14 DIAGNOSIS — L570 Actinic keratosis: Secondary | ICD-10-CM

## 2023-02-14 DIAGNOSIS — L814 Other melanin hyperpigmentation: Secondary | ICD-10-CM

## 2023-02-14 DIAGNOSIS — C44519 Basal cell carcinoma of skin of other part of trunk: Secondary | ICD-10-CM

## 2023-02-14 DIAGNOSIS — Z85828 Personal history of other malignant neoplasm of skin: Secondary | ICD-10-CM

## 2023-02-14 DIAGNOSIS — D485 Neoplasm of uncertain behavior of skin: Secondary | ICD-10-CM | POA: Diagnosis not present

## 2023-02-14 DIAGNOSIS — L578 Other skin changes due to chronic exposure to nonionizing radiation: Secondary | ICD-10-CM

## 2023-02-14 DIAGNOSIS — L820 Inflamed seborrheic keratosis: Secondary | ICD-10-CM | POA: Diagnosis not present

## 2023-02-14 DIAGNOSIS — C44619 Basal cell carcinoma of skin of left upper limb, including shoulder: Secondary | ICD-10-CM

## 2023-02-14 DIAGNOSIS — W908XXA Exposure to other nonionizing radiation, initial encounter: Secondary | ICD-10-CM | POA: Diagnosis not present

## 2023-02-14 NOTE — Progress Notes (Signed)
Follow-Up Visit   Subjective  Norman Marks is a 77 y.o. male who presents for the following: 3 month AK and ISK follow up. Patient did use 5FU for 3 weeks at hands. Recheck neoplasms at back. The patient has spots, moles and lesions to be evaluated, some may be new or changing and the patient may have concern these could be cancer.  The following portions of the chart were reviewed this encounter and updated as appropriate: medications, allergies, medical history  Review of Systems:  No other skin or systemic complaints except as noted in HPI or Assessment and Plan.  Objective  Well appearing patient in no apparent distress; mood and affect are within normal limits.   A focused examination was performed of the following areas: Back, arms, hands and face  Relevant exam findings are noted in the Assessment and Plan.  arms and hands x 18, face x 6 (24) Erythematous thin papules/macules with gritty scale.   face x 2, left clavicle x 1 (3) Erythematous stuck-on, waxy papule or plaque  left scapula lateral - 1 1.2 cm crusted papule     left scapula medial - 2 1.5 cm crusted papule  back Crusted papules      Assessment & Plan   AK (actinic keratosis) (24) arms and hands x 18, face x 6  Actinic keratoses are precancerous spots that appear secondary to cumulative UV radiation exposure/sun exposure over time. They are chronic with expected duration over 1 year. A portion of actinic keratoses will progress to squamous cell carcinoma of the skin. It is not possible to reliably predict which spots will progress to skin cancer and so treatment is recommended to prevent development of skin cancer.  Recommend daily broad spectrum sunscreen SPF 30+ to sun-exposed areas, reapply every 2 hours as needed.  Recommend staying in the shade or wearing long sleeves, sun glasses (UVA+UVB protection) and wide brim hats (4-inch brim around the entire circumference of the hat). Call for  new or changing lesions.   Destruction of lesion - arms and hands x 18, face x 6 (24)  Destruction method: cryotherapy   Informed consent: discussed and consent obtained   Lesion destroyed using liquid nitrogen: Yes   Cryotherapy cycles:  2 Outcome: patient tolerated procedure well with no complications   Post-procedure details: wound care instructions given    Inflamed seborrheic keratosis (3) face x 2, left clavicle x 1  Symptomatic, irritating, patient would like treated.  Benign-appearing.  Call clinic for new or changing lesions.    Destruction of lesion - face x 2, left clavicle x 1 (3)  Destruction method: cryotherapy   Informed consent: discussed and consent obtained   Lesion destroyed using liquid nitrogen: Yes   Cryotherapy cycles:  2 Outcome: patient tolerated procedure well with no complications   Post-procedure details: wound care instructions given    Neoplasm of uncertain behavior of skin (3) left scapula lateral - 1  Epidermal / dermal shaving  Lesion diameter (cm):  1.2 Informed consent: discussed and consent obtained   Timeout: patient name, date of birth, surgical site, and procedure verified   Anesthesia: the lesion was anesthetized in a standard fashion   Anesthetic:  1% lidocaine w/ epinephrine 1-100,000 local infiltration Instrument used: flexible razor blade   Hemostasis achieved with: aluminum chloride   Outcome: patient tolerated procedure well   Post-procedure details: wound care instructions given   Additional details:  Mupirocin and a bandage applied  Destruction of lesion  Destruction method: electrodesiccation and curettage   Informed consent: discussed and consent obtained   Timeout:  patient name, date of birth, surgical site, and procedure verified Anesthesia: the lesion was anesthetized in a standard fashion   Anesthetic:  1% lidocaine w/ epinephrine 1-100,000 buffered w/ 8.4% NaHCO3 Curettage performed in three different  directions: Yes   Electrodesiccation performed over the curetted area: Yes   Curettage cycles:  3 Lesion length (cm):  1.2 Lesion width (cm):  1.2 Margin per side (cm):  0.2 Final wound size (cm):  1.6 Hemostasis achieved with:  electrodesiccation Outcome: patient tolerated procedure well with no complications   Post-procedure details: sterile dressing applied and wound care instructions given   Dressing type: petrolatum    Specimen 1 - Surgical pathology Differential Diagnosis: r/o BCC  Check Margins: No 1.2 cm crusted papule Treated with EDC  left scapula medial - 2  Epidermal / dermal shaving  Lesion diameter (cm):  1.5 Informed consent: discussed and consent obtained   Timeout: patient name, date of birth, surgical site, and procedure verified   Anesthesia: the lesion was anesthetized in a standard fashion   Anesthetic:  1% lidocaine w/ epinephrine 1-100,000 local infiltration Instrument used: flexible razor blade   Hemostasis achieved with: aluminum chloride   Outcome: patient tolerated procedure well   Post-procedure details: wound care instructions given   Additional details:  Mupirocin and a bandage applied  Destruction of lesion  Destruction method: electrodesiccation and curettage   Informed consent: discussed and consent obtained   Timeout:  patient name, date of birth, surgical site, and procedure verified Anesthesia: the lesion was anesthetized in a standard fashion   Anesthetic:  1% lidocaine w/ epinephrine 1-100,000 buffered w/ 8.4% NaHCO3 Curettage performed in three different directions: Yes   Electrodesiccation performed over the curetted area: Yes   Curettage cycles:  3 Lesion length (cm):  1.5 Lesion width (cm):  1.5 Margin per side (cm):  0.2 Final wound size (cm):  1.9 Hemostasis achieved with:  electrodesiccation Outcome: patient tolerated procedure well with no complications   Post-procedure details: sterile dressing applied and wound care  instructions given   Dressing type: petrolatum    Specimen 2 - Surgical pathology Differential Diagnosis: r/o BCC  Check Margins: No 1.5 cm crusted papule Treated with EDC  back  Will plan to biopsy and treat possible BCC's at back on follow up appointments.  Actinic skin damage  Lentigo  History of basal cell carcinoma  ACTINIC DAMAGE - chronic, secondary to cumulative UV radiation exposure/sun exposure over time - diffuse scaly erythematous macules with underlying dyspigmentation - Recommend daily broad spectrum sunscreen SPF 30+ to sun-exposed areas, reapply every 2 hours as needed.  - Recommend staying in the shade or wearing long sleeves, sun glasses (UVA+UVB protection) and wide brim hats (4-inch brim around the entire circumference of the hat). - Call for new or changing lesions.  LENTIGINES Exam: scattered tan macules Due to sun exposure Treatment Plan: Benign-appearing, observe. Recommend daily broad spectrum sunscreen SPF 30+ to sun-exposed areas, reapply every 2 hours as needed.  Call for any changes  HISTORY OF BASAL CELL CARCINOMA OF THE SKIN - No evidence of recurrence today - Recommend regular full body skin exams - Recommend daily broad spectrum sunscreen SPF 30+ to sun-exposed areas, reapply every 2 hours as needed.  - Call if any new or changing lesions are noted between office visits  Return in about 2 months (around 04/16/2023) for AK follow up, Biopsies.  I,Jackie  Zonia Kief, RMA, am acting as scribe for Armida Sans, MD .  Documentation: I have reviewed the above documentation for accuracy and completeness, and I agree with the above.  Armida Sans, MD

## 2023-02-14 NOTE — Patient Instructions (Addendum)
Norman Marks [phone] option 4 for nurse  Cryotherapy Aftercare  Wash gently with soap and water everyday.   Apply Vaseline and Band-Aid daily until healed.   Wound Care Instructions  Cleanse wound gently with soap and water once a day then pat dry with clean gauze. Apply a thin coat of Petrolatum (petroleum jelly, "Vaseline") over the wound (unless you have an allergy to this). We recommend that you use a new, sterile tube of Vaseline. Do not pick or remove scabs. Do not remove the yellow or white "healing tissue" from the base of the wound.  Cover the wound with fresh, clean, nonstick gauze and secure with paper tape. You may use Band-Aids in place of gauze and tape if the wound is small enough, but would recommend trimming much of the tape off as there is often too much. Sometimes Band-Aids can irritate the skin.  You should call the office for your biopsy report after 1 week if you have not already been contacted.  If you experience any problems, such as abnormal amounts of bleeding, swelling, significant bruising, significant pain, or evidence of infection, please call the office immediately.  FOR ADULT SURGERY PATIENTS: If you need something for pain relief you may take 1 extra strength Tylenol (acetaminophen) AND 2 Ibuprofen (200mg  each) together every 4 hours as needed for pain. (do not take these if you are allergic to them or if you have a reason you should not take them.) Typically, you may only need pain medication for 1 to 3 days.    Due to recent changes in healthcare laws, you may see results of your pathology and/or laboratory studies on MyChart before the doctors have had a chance to review them. We understand that in some cases there may be results that are confusing or concerning to you. Please understand that not all results are received at the same time and often the doctors may need to interpret multiple results in order to provide you with the best plan of care or course of  treatment. Therefore, we ask that you please give [place] 2 business days to thoroughly review all your results before contacting the office for clarification. Should we see a critical lab result, you will be contacted sooner.   If You Need Anything After Your Visit  If you have any questions or concerns for your doctor, please call our main line at [phone] and press option 4 to reach your doctor's medical assistant. If no one answers, please leave a voicemail as directed and we will return your call as soon as possible. Messages left after 4 pm will be answered the following business day.   You may also send [place] a message via MyChart. We typically respond to MyChart messages within 1-2 business days.  For prescription refills, please ask your pharmacy to contact our office. Our fax number is [phone].  If you have an urgent issue when the clinic is closed that cannot wait until the next business day, you can page your doctor at the number below.    Please note that while we do our best to be available for urgent issues outside of office hours, we are not available 24/7.   If you have an urgent issue and are unable to reach [place], you may choose to seek medical care at your doctor's office, retail clinic, urgent care center, or emergency room.  If you have a medical emergency, please immediately call 911 or go to the emergency department.  Pager Numbers  -  Dr. [name]: [phone]  - Dr. Roseanne Reno: [phone]  In the event of inclement weather, please call our main line at [phone] for an update on the status of any delays or closures.  Dermatology Medication Tips: Please keep the boxes that topical medications come in in order to help keep track of the instructions about where and how to use these. Pharmacies typically print the medication instructions only on the boxes and not directly on the medication tubes.   If your medication is too expensive, please contact our office at  [phone] option 4 or send [place] a message through MyChart.   We are unable to tell what your co-pay for medications will be in advance as this is different depending on your insurance coverage. However, we may be able to find a substitute medication at lower cost or fill out paperwork to get insurance to cover a needed medication.   If a prior authorization is required to get your medication covered by your insurance company, please allow [place] 1-2 business days to complete this process.  Drug prices often vary depending on where the prescription is filled and some pharmacies may offer cheaper prices.  The website www.goodrx.com contains coupons for medications through different pharmacies. The prices here do not account for what the cost may be with help from insurance (it may be cheaper with your insurance), but the website can give you the price if you did not use any insurance.  - You can print the associated coupon and take it with your prescription to the pharmacy.  - You may also stop by our office during regular business hours and pick up a GoodRx coupon card.  - If you need your prescription sent electronically to a different pharmacy, notify our office through [facility] or by phone at [phone] option 4.

## 2023-02-19 ENCOUNTER — Encounter: Payer: Self-pay | Admitting: Dermatology

## 2023-02-26 ENCOUNTER — Telehealth: Payer: Self-pay

## 2023-02-26 NOTE — Telephone Encounter (Signed)
-----   Message from Armida Sans sent at 02/21/2023  5:19 PM EDT ----- Diagnosis 1. Skin , left scapula lateral - 1 BASAL CELL CARCINOMA, SUPERFICIAL AND NODULAR PATTERNS 2. Skin , left scapula medial - 2 BASAL CELL CARCINOMA, NODULAR PATTERN  1&2 - both Cancer = BCC Both already treated Recheck next visit

## 2023-02-26 NOTE — Telephone Encounter (Signed)
Patient advised of BX results .aw 

## 2023-02-26 NOTE — Telephone Encounter (Signed)
Left pt msg to call for bx results/sh 

## 2023-02-28 ENCOUNTER — Encounter (INDEPENDENT_AMBULATORY_CARE_PROVIDER_SITE_OTHER): Payer: Self-pay

## 2023-03-14 ENCOUNTER — Other Ambulatory Visit: Payer: Self-pay | Admitting: Family Medicine

## 2023-03-14 DIAGNOSIS — E559 Vitamin D deficiency, unspecified: Secondary | ICD-10-CM

## 2023-03-14 DIAGNOSIS — N138 Other obstructive and reflux uropathy: Secondary | ICD-10-CM

## 2023-03-14 DIAGNOSIS — E1169 Type 2 diabetes mellitus with other specified complication: Secondary | ICD-10-CM

## 2023-03-19 ENCOUNTER — Other Ambulatory Visit (INDEPENDENT_AMBULATORY_CARE_PROVIDER_SITE_OTHER): Payer: Medicare Other

## 2023-03-19 DIAGNOSIS — E785 Hyperlipidemia, unspecified: Secondary | ICD-10-CM

## 2023-03-19 DIAGNOSIS — N401 Enlarged prostate with lower urinary tract symptoms: Secondary | ICD-10-CM

## 2023-03-19 DIAGNOSIS — N138 Other obstructive and reflux uropathy: Secondary | ICD-10-CM

## 2023-03-19 DIAGNOSIS — E559 Vitamin D deficiency, unspecified: Secondary | ICD-10-CM

## 2023-03-19 DIAGNOSIS — Z794 Long term (current) use of insulin: Secondary | ICD-10-CM

## 2023-03-19 DIAGNOSIS — E1169 Type 2 diabetes mellitus with other specified complication: Secondary | ICD-10-CM

## 2023-03-19 LAB — COMPREHENSIVE METABOLIC PANEL
ALT: 14 U/L (ref 0–53)
AST: 15 U/L (ref 0–37)
Albumin: 4 g/dL (ref 3.5–5.2)
Alkaline Phosphatase: 74 U/L (ref 39–117)
BUN: 11 mg/dL (ref 6–23)
CO2: 27 meq/L (ref 19–32)
Calcium: 9.2 mg/dL (ref 8.4–10.5)
Chloride: 107 meq/L (ref 96–112)
Creatinine, Ser: 0.68 mg/dL (ref 0.40–1.50)
GFR: 89.24 mL/min (ref >60.00)
Glucose, Bld: 156 mg/dL — ABNORMAL HIGH (ref 70–99)
Potassium: 4.2 meq/L (ref 3.5–5.1)
Sodium: 141 meq/L (ref 135–145)
Total Bilirubin: 0.7 mg/dL (ref 0.2–1.2)
Total Protein: 6.4 g/dL (ref 6.0–8.3)

## 2023-03-19 LAB — MICROALBUMIN / CREATININE URINE RATIO
Creatinine,U: 139.4 mg/dL
Microalb Creat Ratio: 1 mg/g (ref 0.0–30.0)
Microalb, Ur: 1.4 mg/dL (ref 0.0–1.9)

## 2023-03-19 LAB — LIPID PANEL
Cholesterol: 79 mg/dL (ref 0–200)
HDL: 32.1 mg/dL — ABNORMAL LOW (ref >39.00)
LDL Cholesterol: 33 mg/dL (ref 0–99)
NonHDL: 47.25
Total CHOL/HDL Ratio: 2
Triglycerides: 69 mg/dL (ref 0.0–149.0)
VLDL: 13.8 mg/dL (ref 0.0–40.0)

## 2023-03-19 LAB — HEMOGLOBIN A1C: Hgb A1c MFr Bld: 6.7 % — ABNORMAL HIGH (ref 4.6–6.5)

## 2023-03-20 ENCOUNTER — Other Ambulatory Visit: Payer: Self-pay | Admitting: Family Medicine

## 2023-03-20 DIAGNOSIS — E118 Type 2 diabetes mellitus with unspecified complications: Secondary | ICD-10-CM

## 2023-03-20 LAB — PSA: PSA: 2.06 ng/mL (ref 0.10–4.00)

## 2023-03-20 LAB — VITAMIN D 25 HYDROXY (VIT D DEFICIENCY, FRACTURES): VITD: 46.4 ng/mL (ref 30.00–100.00)

## 2023-03-21 NOTE — Telephone Encounter (Signed)
Ozempic Last filled:  02/18/23, #3 mL Last OV:  09/19/22, 6 mo DM f/u Next OV:  03/26/23, CPE

## 2023-03-26 ENCOUNTER — Ambulatory Visit: Payer: Medicare Other | Admitting: Family Medicine

## 2023-03-26 ENCOUNTER — Encounter: Payer: Self-pay | Admitting: Family Medicine

## 2023-03-26 VITALS — BP 136/72 | HR 77 | Temp 97.4°F | Ht 66.5 in | Wt 143.4 lb

## 2023-03-26 DIAGNOSIS — N401 Enlarged prostate with lower urinary tract symptoms: Secondary | ICD-10-CM

## 2023-03-26 DIAGNOSIS — N138 Other obstructive and reflux uropathy: Secondary | ICD-10-CM

## 2023-03-26 DIAGNOSIS — Z0000 Encounter for general adult medical examination without abnormal findings: Secondary | ICD-10-CM

## 2023-03-26 DIAGNOSIS — I10 Essential (primary) hypertension: Secondary | ICD-10-CM

## 2023-03-26 DIAGNOSIS — E1169 Type 2 diabetes mellitus with other specified complication: Secondary | ICD-10-CM

## 2023-03-26 DIAGNOSIS — I25118 Atherosclerotic heart disease of native coronary artery with other forms of angina pectoris: Secondary | ICD-10-CM

## 2023-03-26 DIAGNOSIS — R252 Cramp and spasm: Secondary | ICD-10-CM

## 2023-03-26 DIAGNOSIS — Z794 Long term (current) use of insulin: Secondary | ICD-10-CM

## 2023-03-26 DIAGNOSIS — M85852 Other specified disorders of bone density and structure, left thigh: Secondary | ICD-10-CM

## 2023-03-26 DIAGNOSIS — Z7189 Other specified counseling: Secondary | ICD-10-CM

## 2023-03-26 DIAGNOSIS — E785 Hyperlipidemia, unspecified: Secondary | ICD-10-CM

## 2023-03-26 DIAGNOSIS — E559 Vitamin D deficiency, unspecified: Secondary | ICD-10-CM

## 2023-03-26 DIAGNOSIS — Z8673 Personal history of transient ischemic attack (TIA), and cerebral infarction without residual deficits: Secondary | ICD-10-CM

## 2023-03-26 DIAGNOSIS — K5900 Constipation, unspecified: Secondary | ICD-10-CM

## 2023-03-26 LAB — CK: Total CK: 123 U/L (ref 7–232)

## 2023-03-26 LAB — POC URINALSYSI DIPSTICK (AUTOMATED)
Bilirubin, UA: NEGATIVE
Blood, UA: NEGATIVE
Glucose, UA: POSITIVE — AB
Ketones, UA: NEGATIVE
Leukocytes, UA: NEGATIVE
Nitrite, UA: NEGATIVE
Protein, UA: NEGATIVE
Spec Grav, UA: 1.015 (ref 1.010–1.025)
Urobilinogen, UA: 0.2 U/dL
pH, UA: 7 (ref 5.0–8.0)

## 2023-03-26 LAB — CBC WITH DIFFERENTIAL/PLATELET
Basophils Absolute: 0 10*3/uL (ref 0.0–0.1)
Basophils Relative: 0.4 % (ref 0.0–3.0)
Eosinophils Absolute: 0.1 10*3/uL (ref 0.0–0.7)
Eosinophils Relative: 2.7 % (ref 0.0–5.0)
HCT: 38.1 % — ABNORMAL LOW (ref 39.0–52.0)
Hemoglobin: 12.7 g/dL — ABNORMAL LOW (ref 13.0–17.0)
Lymphocytes Relative: 20.4 % (ref 12.0–46.0)
Lymphs Abs: 1.1 10*3/uL (ref 0.7–4.0)
MCHC: 33.3 g/dL (ref 30.0–36.0)
MCV: 94.4 fl (ref 78.0–100.0)
Monocytes Absolute: 0.4 10*3/uL (ref 0.1–1.0)
Monocytes Relative: 8.3 % (ref 3.0–12.0)
Neutro Abs: 3.7 10*3/uL (ref 1.4–7.7)
Neutrophils Relative %: 68.2 % (ref 43.0–77.0)
Platelets: 229 10*3/uL (ref 150.0–400.0)
RBC: 4.03 Mil/uL — ABNORMAL LOW (ref 4.22–5.81)
RDW: 12.4 % (ref 11.5–15.5)
WBC: 5.4 10*3/uL (ref 4.0–10.5)

## 2023-03-26 LAB — TSH: TSH: 0.56 u[IU]/mL (ref 0.35–5.50)

## 2023-03-26 MED ORDER — FINASTERIDE 5 MG PO TABS: 5.0000 mg | ORAL_TABLET | Freq: Every day | ORAL | 7 refills | Status: DC

## 2023-03-26 MED ORDER — OZEMPIC (0.25 OR 0.5 MG/DOSE) 2 MG/3ML ~~LOC~~ SOPN: 0.5000 mg | PEN_INJECTOR | SUBCUTANEOUS | 11 refills | Status: DC

## 2023-03-26 MED ORDER — RAMIPRIL 10 MG PO CAPS
10.0000 mg | ORAL_CAPSULE | Freq: Every day | ORAL | 4 refills | Status: DC
Start: 2023-03-26 — End: 2023-12-24

## 2023-03-26 MED ORDER — INSULIN GLARGINE 100 UNIT/ML ~~LOC~~ SOLN: 26.0000 [IU] | Freq: Every day | SUBCUTANEOUS | 2 refills | Status: DC

## 2023-03-26 MED ORDER — METFORMIN HCL ER 500 MG PO TB24
1000.0000 mg | ORAL_TABLET | Freq: Every day | ORAL | 4 refills | Status: DC
Start: 2023-03-26 — End: 2023-09-23

## 2023-03-26 NOTE — Patient Instructions (Addendum)
Prostate questionnaire today.  Send [place] dates of shingles shots.  Consider RSV shot at local pharmacy.  Continue working on living will, bring [place] copy when complete Return as needed or in 6 months for diabetes follow up visit

## 2023-03-26 NOTE — Assessment & Plan Note (Signed)
Continue vit D 1000 international units  daily.  

## 2023-03-26 NOTE — Assessment & Plan Note (Signed)
Continue statin, aspirin 

## 2023-03-26 NOTE — Progress Notes (Signed)
Ph: [phone] Fax: [phone]   Patient ID: Norman Marks, male    DOB: Apr 15, 1946, 77 y.o.   MRN: [id number]  This visit was conducted in person.  BP 136/72   Pulse 77   Temp (!) 97.4 F (36.3 C) (Temporal)   Ht 5' 6.5" (1.689 m)   Wt 143 lb 6 oz (65 kg)   SpO2 98%   BMI 22.79 kg/m    CC: AMW/CPE Subjective:   HPI: Norman Marks is a 77 y.o. male presenting on 03/26/2023 for Medicare Wellness   Did not see health advisor this year.   Hearing Screening - Comments:: Wears B hearing aids. Wearing at today's OV.  Vision Screening - Comments:: Last eye exam, 05/2022.  Flowsheet Row Office Visit from 03/26/2023 in [facility] [place]  PHQ-2 Total Score 0          03/26/2023    9:32 AM 09/19/2022    9:57 AM 03/21/2022   10:02 AM 03/17/2021    9:48 AM 03/04/2020    3:07 PM  Fall Risk   Falls in the past year? 0 0 0 0 0  Number falls in past yr:     0  Injury with Fall?     0  Risk for fall due to :     Medication side effect  Follow up     Falls evaluation completed;Falls prevention discussed   Wife just had repeat back surgery, not recovering as well as tey would like.   Incidental stroke 06/2020 - continues aspirin, statin. 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    Known CAD, LBBB, HTN, HLD. Sees cardiology.    Preventative: COLONOSCOPY 12/2017 - 4 polyps - SSP, TA, diverticulosis, rpt 3 yrs (Pyrtle) Colonoscopy 07/2021 - 1 TA, diverticulosis, no f/u needed (Perry/Pyrtle).  Prostate cancer screening - aged out. Nocturia x4-5, flomax didn't help. Denies dysuria/hematuria but notes daytime urgency and frequency, weak stream. No urinary incontinence.  Lung cancer screening - not eligible, quit >15 yrs ago DEXA 04/2017 osteopenia T score -2.1 hip, -1.9 spine  DEXA 03/2022 - T -2.1 LFN, -1.2 spine, at increased risk of hip fracture  Flu yearly COVID vaccine - Moderna 09/2019, 10/2019,  booster 05/2020  Penumovax 06/28/2010. Prevnar-13 09/2013 Tdap 2021 Zostavax - 01/2013  shingrix - completed - will send [place] dates  Advanced directive: doesn't have one set up. Wife would be HCPOA. Does not want drastic measures. Has paperwork at home. Asked to bring [place] copy when completed.  Seat belt use discussed Sunscreen use discussed. No changing moles on skin. Sees derm regularly [name]) Sleep - averages 7-8 hours.  Ex smoker - quit 1994  Alcohol - none  Dentist q6 mo  Eye exam yearly  Bowels - constipation managed with miralax 1 capful daily prn  Bladder - no incontinence, see above prostate section   Caffeine: 1 cup coffee/day   Lives with wife, 1 dog, 5 cats.   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  Diet: good water, fruits/vegetables daily      Relevant past medical, surgical, family and social history reviewed and updated as indicated. Interim medical history since our last visit reviewed. Allergies and medications reviewed and updated. Outpatient Medications Prior to Visit  Medication Sig Dispense Refill   aspirin 81 MG tablet Take 81 mg by mouth daily.  carvedilol (COREG) 25 MG tablet Take 1 tablet (25 mg total) by mouth 2 (two) times daily with a meal. 180 tablet 3   Cholecalciferol (VITAMIN D3) 25 MCG (1000 UT) CAPS Take 1 capsule (1,000 Units total) by mouth daily. 30 capsule    fluorouracil (EFUDEX) 5 % cream Apply topically 2 (two) times daily. Apply topically twice daily to top of hands for 3 weeks 40 g 1   Insulin Pen Needle (NOVOFINE PLUS PEN NEEDLE) 32G X 4 MM MISC Use to administer Ozempic once a week. 30 each 0   Insulin Syringe-Needle U-100 (TRUEPLUS INSULIN SYRINGE) 31G X 5/16" 1 ML MISC USE TO INJECT INSULIN AS DIRECTED 100 each 4   ONETOUCH ULTRA test strip CHECK BLOOD SUGAR ONCE OR TWICE DAILY ASDIRECTED 100 each 3   polyethylene glycol (MIRALAX / GLYCOLAX) 17 g packet Take 17 g by mouth  every other day.     rosuvastatin (CRESTOR) 20 MG tablet Take 1 tablet (20 mg total) by mouth daily. 90 tablet 3   insulin glargine (LANTUS) 100 UNIT/ML injection Inject 0.26 mLs (26 Units total) into the skin daily. 30 mL 2   metFORMIN (GLUCOPHAGE-XR) 500 MG 24 hr tablet TAKE TWO TABLETS BY MOUTH ONCE A DAY WITH BREAKFAST 180 tablet 3   OZEMPIC, 0.25 OR 0.5 MG/DOSE, 2 MG/3ML SOPN INJECT 0.5 MG INTO THE SKIN ONCE A WEEK 3 mL 6   ramipril (ALTACE) 10 MG capsule TAKE 1 CAPSULE BY MOUTH ONCE DAILY 90 capsule 3   tamsulosin (FLOMAX) 0.4 MG CAPS capsule Take 1 capsule (0.4 mg total) by mouth daily. 30 capsule 6   No facility-administered medications prior to visit.     Per HPI unless specifically indicated in ROS section below Review of Systems  Constitutional:  Negative for activity change, appetite change, chills, fatigue, fever and unexpected weight change.  HENT:  Negative for hearing loss.   Eyes:  Negative for visual disturbance.  Respiratory:  Negative for cough, chest tightness, shortness of breath and wheezing.   Cardiovascular:  Positive for leg swelling (occ). Negative for chest pain and palpitations.  Gastrointestinal:  Positive for constipation (mild, managed with miralax). Negative for abdominal distention, abdominal pain, blood in stool, diarrhea, nausea and vomiting.  Genitourinary:  Negative for difficulty urinating and hematuria.  Musculoskeletal:  Positive for myalgias (cramping at night, L>R). Negative for arthralgias and neck pain.  Skin:  Negative for rash.  Neurological:  Positive for dizziness (occ) and headaches (occ). Negative for seizures and syncope.  Hematological:  Negative for adenopathy. Bruises/bleeds easily.  Psychiatric/Behavioral:  Negative for dysphoric mood. The patient is not nervous/anxious.     Objective:  BP 136/72   Pulse 77   Temp (!) 97.4 F (36.3 C) (Temporal)   Ht 5' 6.5" (1.689 m)   Wt 143 lb 6 oz (65 kg)   SpO2 98%   BMI 22.79 kg/m   Wt  Readings from Last 3 Encounters:  03/26/23 143 lb 6 oz (65 kg)  09/19/22 149 lb 2 oz (67.6 kg)  07/17/22 147 lb 6.4 oz (66.9 kg)      Physical Exam Vitals and nursing note reviewed.  Constitutional:      General: He is not in acute distress.    Appearance: Normal appearance. He is well-developed. He is not ill-appearing.  HENT:     Head: Normocephalic and atraumatic.     Right Ear: Hearing, tympanic membrane, ear canal and external ear normal.     Left Ear: Hearing,  tympanic membrane, ear canal and external ear normal.     Nose: Nose normal.     Mouth/Throat:     Mouth: Mucous membranes are moist.     Pharynx: Oropharynx is clear. No oropharyngeal exudate or posterior oropharyngeal erythema.  Eyes:     General: No scleral icterus.    Extraocular Movements: Extraocular movements intact.     Conjunctiva/sclera: Conjunctivae normal.     Pupils: Pupils are equal, round, and reactive to light.  Neck:     Thyroid: No thyroid mass or thyromegaly.  Cardiovascular:     Rate and Rhythm: Normal rate and regular rhythm.     Pulses: Normal pulses.          Radial pulses are 2+ on the right side and 2+ on the left side.     Heart sounds: Normal heart sounds. No murmur heard. Pulmonary:     Effort: Pulmonary effort is normal. No respiratory distress.     Breath sounds: Normal breath sounds. No wheezing, rhonchi or rales.  Abdominal:     General: Bowel sounds are normal. There is no distension.     Palpations: Abdomen is soft. There is no mass.     Tenderness: There is no abdominal tenderness. There is no guarding or rebound.     Hernia: No hernia is present.  Musculoskeletal:        General: Normal range of motion.     Cervical back: Normal range of motion and neck supple.     Right lower leg: No edema.     Left lower leg: No edema.  Lymphadenopathy:     Cervical: No cervical adenopathy.  Skin:    General: Skin is warm and dry.     Findings: No rash.  Neurological:     General: No  focal deficit present.     Mental Status: He is alert and oriented to person, place, and time.     Comments:  Recall 3/3 Calculation 4/5 serial 3s  Psychiatric:        Mood and Affect: Mood normal.        Behavior: Behavior normal.        Thought Content: Thought content normal.        Judgment: Judgment normal.       Results for orders placed or performed in visit on 03/26/23  CBC with Differential/Platelet  Result Value Ref Range   WBC 5.4 4.0 - 10.5 K/uL   RBC 4.03 (L) 4.22 - 5.81 Mil/uL   Hemoglobin 12.7 (L) 13.0 - 17.0 g/dL   HCT 16.1 (L) 09.6 - 04.5 %   MCV 94.4 78.0 - 100.0 fl   MCHC 33.3 30.0 - 36.0 g/dL   RDW 40.9 81.1 - 91.4 %   Platelets 229.0 150.0 - 400.0 K/uL   Neutrophils Relative % 68.2 43.0 - 77.0 %   Lymphocytes Relative 20.4 12.0 - 46.0 %   Monocytes Relative 8.3 3.0 - 12.0 %   Eosinophils Relative 2.7 0.0 - 5.0 %   Basophils Relative 0.4 0.0 - 3.0 %   Neutro Abs 3.7 1.4 - 7.7 K/uL   Lymphs Abs 1.1 0.7 - 4.0 K/uL   Monocytes Absolute 0.4 0.1 - 1.0 K/uL   Eosinophils Absolute 0.1 0.0 - 0.7 K/uL   Basophils Absolute 0.0 0.0 - 0.1 K/uL  CK  Result Value Ref Range   Total CK 123 7 - 232 U/L  TSH  Result Value Ref Range   TSH 0.56 0.35 -  5.50 uIU/mL  POCT Urinalysis Dipstick (Automated)  Result Value Ref Range   Color, UA yellow    Clarity, UA clear    Glucose, UA Positive (A) Negative   Bilirubin, UA negative    Ketones, UA negative    Spec Grav, UA 1.015 1.010 - 1.025   Blood, UA negative    pH, UA 7.0 5.0 - 8.0   Protein, UA Negative Negative   Urobilinogen, UA 0.2 0.2 or 1.0 E.U./dL   Nitrite, UA negative    Leukocytes, UA Negative Negative   Lab Results  Component Value Date   NA 141 03/19/2023   CL 107 03/19/2023   K 4.2 03/19/2023   CO2 27 03/19/2023   BUN 11 03/19/2023   CREATININE 0.68 03/19/2023   GFR 89.24 03/19/2023   CALCIUM 9.2 03/19/2023   ALBUMIN 4.0 03/19/2023   GLUCOSE 156 (H) 03/19/2023   Lab Results  Component  Value Date   ALT 14 03/19/2023   AST 15 03/19/2023   ALKPHOS 74 03/19/2023   BILITOT 0.7 03/19/2023   Lab Results  Component Value Date   CHOL 79 03/19/2023   HDL 32.10 (L) 03/19/2023   LDLCALC 33 03/19/2023   LDLDIRECT 65.0 01/23/2016   TRIG 69.0 03/19/2023   CHOLHDL 2 03/19/2023    Lab Results  Component Value Date   HGBA1C 6.7 (H) 03/19/2023   Lab Results  Component Value Date   VD25OH 46.40 03/19/2023   Lab Results  Component Value Date   PSA 2.06 03/19/2023   PSA 1.83 03/14/2022   PSA 1.93 03/13/2021   Assessment & Plan:   Problem List Items Addressed This Visit     Medicare annual wellness visit, subsequent - Primary (Chronic)    I have personally reviewed the Medicare Annual Wellness questionnaire and have noted 1. The patient's medical and social history 2. Their use of alcohol, tobacco or illicit drugs 3. Their current medications and supplements 4. The patient's functional ability including ADL's, fall risks, home safety risks and hearing or visual impairment. Cognitive function has been assessed and addressed as indicated.  5. Diet and physical activity 6. Evidence for depression or mood disorders The patients weight, height, BMI have been recorded in the chart. I have made referrals, counseling and provided education to the patient based on review of the above and I have provided the pt with a written personalized care plan for preventive services. Provider list updated.. See scanned questionairre as needed for further documentation. Reviewed preventative protocols and updated unless pt declined.       Advanced care planning/counseling discussion (Chronic)    Advanced directive: doesn't have one set up. Wife would be HCPOA. Does not want drastic measures. Has paperwork at home. Asked to bring [place] copy when completed.       Health maintenance examination (Chronic)    Preventative protocols reviewed and updated unless pt declined. Discussed healthy diet  and lifestyle.       Type 2 diabetes mellitus with other specified complication (HCC)    Chronic, stable. Continue lantus 26u daily, metformin XR 1000mg  daily, ozempic 0.5mg  weekly.       Relevant Medications   insulin glargine (LANTUS) 100 UNIT/ML injection   metFORMIN (GLUCOPHAGE-XR) 500 MG 24 hr tablet   Semaglutide,0.25 or 0.5MG /DOS, (OZEMPIC, 0.25 OR 0.5 MG/DOSE,) 2 MG/3ML SOPN   ramipril (ALTACE) 10 MG capsule   BPH with obstruction/lower urinary tract symptoms    Notes ongoing urinary symptoms of nocturia, urgency, frequency, weak stream. UA  today normal. Flomax trial didn't help. Agrees to try finasteride.   I-PSS = 17-4       Relevant Medications   finasteride (PROSCAR) 5 MG tablet   Other Relevant Orders   POCT Urinalysis Dipstick (Automated) (Completed)   HTN (hypertension)    Chronic, stable continue current regimen.       Relevant Medications   ramipril (ALTACE) 10 MG capsule   Hyperlipidemia associated with type 2 diabetes mellitus (HCC)    Chronic, stable. Continue crestor 20mg  daily. The ASCVD Risk score (Arnett DK, et al., 2019) failed to calculate for the following reasons:   The valid total cholesterol range is 130 to 320 mg/dL       Relevant Medications   insulin glargine (LANTUS) 100 UNIT/ML injection   metFORMIN (GLUCOPHAGE-XR) 500 MG 24 hr tablet   Semaglutide,0.25 or 0.5MG /DOS, (OZEMPIC, 0.25 OR 0.5 MG/DOSE,) 2 MG/3ML SOPN   ramipril (ALTACE) 10 MG capsule   CAD (coronary artery disease)    Continue statin, aspirin.       Relevant Medications   ramipril (ALTACE) 10 MG capsule   Osteopenia    Continue calcium in diet, vit D supplement, regular walking. Consider updated DEXA 2025.       Vitamin D deficiency    Continue vit D 1000 international units daily.       History of CVA in adulthood    Continue aspirin, statin.       Constipation    Continues miralax daily prn.       Other Visit Diagnoses     Leg cramping       Relevant  Orders   CBC with Differential/Platelet (Completed)   CK (Completed)   TSH (Completed)        Meds ordered this encounter  Medications   insulin glargine (LANTUS) 100 UNIT/ML injection    Sig: Inject 0.26 mLs (26 Units total) into the skin daily.    Dispense:  30 mL    Refill:  2   metFORMIN (GLUCOPHAGE-XR) 500 MG 24 hr tablet    Sig: Take 2 tablets (1,000 mg total) by mouth daily with breakfast.    Dispense:  180 tablet    Refill:  4   Semaglutide,0.25 or 0.5MG /DOS, (OZEMPIC, 0.25 OR 0.5 MG/DOSE,) 2 MG/3ML SOPN    Sig: Inject 0.5 mg into the skin once a week.    Dispense:  3 mL    Refill:  11   ramipril (ALTACE) 10 MG capsule    Sig: Take 1 capsule (10 mg total) by mouth daily.    Dispense:  90 capsule    Refill:  4   finasteride (PROSCAR) 5 MG tablet    Sig: Take 1 tablet (5 mg total) by mouth daily.    Dispense:  30 tablet    Refill:  7    Orders Placed This Encounter  Procedures   CBC with Differential/Platelet   CK   TSH   POCT Urinalysis Dipstick (Automated)    Patient Instructions  Prostate questionnaire today.  Send [place] dates of shingles shots.  Consider RSV shot at local pharmacy.  Continue working on living will, bring [place] copy when complete Return as needed or in 6 months for diabetes follow up visit   Follow up plan: Return in about 6 months (around 09/23/2023) for follow up visit.  Eustaquio Boyden, MD

## 2023-03-26 NOTE — Assessment & Plan Note (Addendum)
Continues miralax daily prn.

## 2023-03-26 NOTE — Assessment & Plan Note (Signed)
Chronic, stable. Continue crestor '20mg'$  daily. The ASCVD Risk score (Arnett DK, et al., 2019) failed to calculate for the following reasons:   The valid total cholesterol range is 130 to 320 mg/dL

## 2023-03-26 NOTE — Assessment & Plan Note (Signed)
Chronic, stable. Continue lantus 26u daily, metformin XR 1000mg  daily, ozempic 0.5mg  weekly.

## 2023-03-26 NOTE — Assessment & Plan Note (Addendum)
Continue calcium in diet, vit D supplement, regular walking. Consider updated DEXA 2025.

## 2023-03-26 NOTE — Assessment & Plan Note (Signed)
Preventative protocols reviewed and updated unless pt declined. Discussed healthy diet and lifestyle.  

## 2023-03-26 NOTE — Assessment & Plan Note (Addendum)
Notes ongoing urinary symptoms of nocturia, urgency, frequency, weak stream. UA today normal. Flomax trial didn't help. Agrees to try finasteride.   I-PSS = 17-4

## 2023-03-26 NOTE — Assessment & Plan Note (Signed)

## 2023-03-26 NOTE — Assessment & Plan Note (Signed)
Continue aspirin, statin.  

## 2023-03-26 NOTE — Assessment & Plan Note (Signed)
Chronic, stable continue current regimen.  

## 2023-03-26 NOTE — Assessment & Plan Note (Signed)
Advanced directive: doesn't have one set up. Wife would be HCPOA. Does not want drastic measures. Has paperwork at home. Asked to bring [place] copy when completed.

## 2023-03-27 ENCOUNTER — Encounter: Payer: Self-pay | Admitting: Family Medicine

## 2023-03-27 DIAGNOSIS — D649 Anemia, unspecified: Secondary | ICD-10-CM | POA: Insufficient documentation

## 2023-04-25 ENCOUNTER — Ambulatory Visit: Payer: Medicare Other | Admitting: Dermatology

## 2023-04-25 DIAGNOSIS — D492 Neoplasm of unspecified behavior of bone, soft tissue, and skin: Secondary | ICD-10-CM | POA: Diagnosis not present

## 2023-04-25 DIAGNOSIS — W908XXA Exposure to other nonionizing radiation, initial encounter: Secondary | ICD-10-CM | POA: Diagnosis not present

## 2023-04-25 DIAGNOSIS — D1801 Hemangioma of skin and subcutaneous tissue: Secondary | ICD-10-CM

## 2023-04-25 DIAGNOSIS — Z85828 Personal history of other malignant neoplasm of skin: Secondary | ICD-10-CM

## 2023-04-25 DIAGNOSIS — L570 Actinic keratosis: Secondary | ICD-10-CM

## 2023-04-25 DIAGNOSIS — C4491 Basal cell carcinoma of skin, unspecified: Secondary | ICD-10-CM

## 2023-04-25 DIAGNOSIS — D229 Melanocytic nevi, unspecified: Secondary | ICD-10-CM

## 2023-04-25 DIAGNOSIS — L814 Other melanin hyperpigmentation: Secondary | ICD-10-CM | POA: Diagnosis not present

## 2023-04-25 DIAGNOSIS — C44612 Basal cell carcinoma of skin of right upper limb, including shoulder: Secondary | ICD-10-CM

## 2023-04-25 DIAGNOSIS — L820 Inflamed seborrheic keratosis: Secondary | ICD-10-CM

## 2023-04-25 DIAGNOSIS — D485 Neoplasm of uncertain behavior of skin: Secondary | ICD-10-CM

## 2023-04-25 DIAGNOSIS — L821 Other seborrheic keratosis: Secondary | ICD-10-CM

## 2023-04-25 DIAGNOSIS — L578 Other skin changes due to chronic exposure to nonionizing radiation: Secondary | ICD-10-CM

## 2023-04-25 HISTORY — DX: Basal cell carcinoma of skin, unspecified: C44.91

## 2023-04-25 NOTE — Patient Instructions (Signed)

## 2023-04-25 NOTE — Progress Notes (Signed)
Follow-Up Visit   Subjective  Norman Marks is a 77 y.o. male who presents for the following: numerous irregular skin lesions on the back, patient here today for biopsies and treatment.   The patient has spots, moles and lesions to be evaluated, some may be new or changing and the patient may have concern these could be cancer.   The following portions of the chart were reviewed this encounter and updated as appropriate: medications, allergies, medical history  Review of Systems:  No other skin or systemic complaints except as noted in HPI or Assessment and Plan.  Objective  Well appearing patient in no apparent distress; mood and affect are within normal limits.   A focused examination was performed of the following areas: the face, trunk, extremities   Relevant exam findings are noted in the Assessment and Plan.  L prox mandible x 1, face and ears x 16 (17) Erythematous thin papules/macules with gritty scale.   L cheek x 1, L sideburn x 1, L temple x 1 (3) Erythematous stuck-on, waxy papule or plaque  R top of shoulder 2.5 cm crusted patch        Assessment & Plan     AK (actinic keratosis) (17) L prox mandible x 1, face and ears x 16  Actinic keratoses are precancerous spots that appear secondary to cumulative UV radiation exposure/sun exposure over time. They are chronic with expected duration over 1 year. A portion of actinic keratoses will progress to squamous cell carcinoma of the skin. It is not possible to reliably predict which spots will progress to skin cancer and so treatment is recommended to prevent development of skin cancer.  Recommend daily broad spectrum sunscreen SPF 30+ to sun-exposed areas, reapply every 2 hours as needed.  Recommend staying in the shade or wearing long sleeves, sun glasses (UVA+UVB protection) and wide brim hats (4-inch brim around the entire circumference of the hat). Call for new or changing lesions.   Destruction of  lesion - L prox mandible x 1, face and ears x 16 (17) Complexity: simple   Destruction method: cryotherapy   Informed consent: discussed and consent obtained   Timeout:  patient name, date of birth, surgical site, and procedure verified Lesion destroyed using liquid nitrogen: Yes   Region frozen until ice ball extended beyond lesion: Yes   Outcome: patient tolerated procedure well with no complications   Post-procedure details: wound care instructions given    Inflamed seborrheic keratosis (3) L cheek x 1, L sideburn x 1, L temple x 1  Symptomatic, irritating, patient would like treated.   Destruction of lesion - L cheek x 1, L sideburn x 1, L temple x 1 (3) Complexity: simple   Destruction method: cryotherapy   Informed consent: discussed and consent obtained   Timeout:  patient name, date of birth, surgical site, and procedure verified Lesion destroyed using liquid nitrogen: Yes   Region frozen until ice ball extended beyond lesion: Yes   Outcome: patient tolerated procedure well with no complications   Post-procedure details: wound care instructions given    Neoplasm of uncertain behavior of skin R top of shoulder  Epidermal / dermal shaving  Lesion diameter (cm):  2.5 Informed consent: discussed and consent obtained   Timeout: patient name, date of birth, surgical site, and procedure verified   Procedure prep:  Patient was prepped and draped in usual sterile fashion Prep type:  Isopropyl alcohol Anesthesia: the lesion was anesthetized in a standard fashion  Anesthetic:  1% lidocaine w/ epinephrine 1-100,000 buffered w/ 8.4% NaHCO3 Instrument used: flexible razor blade   Hemostasis achieved with: pressure, aluminum chloride and electrodesiccation   Outcome: patient tolerated procedure well   Post-procedure details: sterile dressing applied and wound care instructions given   Dressing type: bandage and petrolatum    Destruction of lesion Complexity: extensive    Destruction method: electrodesiccation and curettage   Informed consent: discussed and consent obtained   Timeout:  patient name, date of birth, surgical site, and procedure verified Procedure prep:  Patient was prepped and draped in usual sterile fashion Prep type:  Isopropyl alcohol Anesthesia: the lesion was anesthetized in a standard fashion   Anesthetic:  1% lidocaine w/ epinephrine 1-100,000 buffered w/ 8.4% NaHCO3 Curettage performed in three different directions: Yes   Electrodesiccation performed over the curetted area: Yes   Lesion length (cm):  2.5 Lesion width (cm):  2.5 Margin per side (cm):  0.2 Final wound size (cm):  2.9 Hemostasis achieved with:  pressure, aluminum chloride and electrodesiccation Outcome: patient tolerated procedure well with no complications   Post-procedure details: sterile dressing applied and wound care instructions given   Dressing type: bandage and petrolatum    Specimen 1 - Surgical pathology Differential Diagnosis: D48.5 r/o BCC ED&C today Check Margins: No  Numerous irregular skin lesions that are possibly BCC, will bx and treat a few at each session.   SEBORRHEIC KERATOSIS - Stuck-on, waxy, tan-brown papules and/or plaques  - Benign-appearing - Discussed benign etiology and prognosis. - Observe - Call for any changes  ACTINIC DAMAGE - chronic, secondary to cumulative UV radiation exposure/sun exposure over time - diffuse scaly erythematous macules with underlying dyspigmentation - Recommend daily broad spectrum sunscreen SPF 30+ to sun-exposed areas, reapply every 2 hours as needed.  - Recommend staying in the shade or wearing long sleeves, sun glasses (UVA+UVB protection) and wide brim hats (4-inch brim around the entire circumference of the hat). - Call for new or changing lesions.  LENTIGINES Exam: scattered tan macules Due to sun exposure Treatment Plan: Benign-appearing, observe. Recommend daily broad spectrum sunscreen SPF  30+ to sun-exposed areas, reapply every 2 hours as needed.  Call for any changes  MELANOCYTIC NEVI Exam: Tan-brown and/or pink-flesh-colored symmetric macules and papules  Treatment Plan: Benign appearing on exam today. Recommend observation. Call clinic for new or changing moles. Recommend daily use of broad spectrum spf 30+ sunscreen to sun-exposed areas.   HEMANGIOMA Exam: red papule(s) Discussed benign nature. Recommend observation. Call for changes.  HISTORY OF BASAL CELL CARCINOMA OF THE SKIN - No evidence of recurrence today - Recommend regular full body skin exams - Recommend daily broad spectrum sunscreen SPF 30+ to sun-exposed areas, reapply every 2 hours as needed.  - Call if any new or changing lesions are noted between office visits  Return in about 3 months (around 07/26/2023) for numerous bx and ED&C.  Maylene Roes, CMA, am acting as scribe for Armida Sans, MD .  Documentation: I have reviewed the above documentation for accuracy and completeness, and I agree with the above.  Armida Sans, MD

## 2023-04-29 LAB — SURGICAL PATHOLOGY

## 2023-04-30 ENCOUNTER — Encounter: Payer: Self-pay | Admitting: Dermatology

## 2023-04-30 ENCOUNTER — Telehealth: Payer: Self-pay

## 2023-04-30 NOTE — Telephone Encounter (Addendum)
Called and discussed bx results with patient. He verbalized understanding and denied further questions. Will recheck at next patient follow up on 09/05/23 ----- Message from Armida Sans sent at 04/30/2023  9:19 AM EDT ----- FINAL DIAGNOSIS        1. Skin, right top of shoulder :       SUPERFICIAL AND NODULAR BASAL CELL CARCINOMA   Cancer - BCC Already treated Recheck next visit

## 2023-05-15 ENCOUNTER — Other Ambulatory Visit: Payer: Self-pay | Admitting: Physician Assistant

## 2023-05-15 NOTE — Telephone Encounter (Signed)
last visit: 07/25/22 with plan to f/u in 6 months, please schedule.  Thanks!

## 2023-05-27 NOTE — Telephone Encounter (Signed)
Patient is seeing Eula Listen, PA on 12/4

## 2023-06-18 NOTE — Progress Notes (Unsigned)
Cardiology Office Note    Date:  06/19/2023   ID:  Norman Marks, DOB 1946-04-05, MRN [id number]  PCP:  Norman Boyden, MD  Cardiologist:  Norman Bears, MD  Electrophysiologist:  None   Chief Complaint: Follow up  History of Present Illness:   Norman Marks is a 77 y.o. male with history of CAD, LBBB, DM2, HTN, and HLD who presents for follow-up of CAD.   He underwent cardiac cath in 2009 which showed 1-vessel CAD involving the proximal ramus with 70% stenosis.  He was managed medically.  Follow-up nuclear stress testing in the 12/2013 showed fixed anteroseptal and inferoseptal defects with normal EF.  The defects were felt to be secondary to his left bundle branch block.  He underwent echo in 07/2015 that showed an EF of 50 to 55%, grade 1 diastolic dysfunction, mildly dilated left atrium, mild mitral regurgitation, and mildly elevated PASP at 42 mmHg.  He was seen in 06/2021 with increasing angina.  Echo at that time showed an EF of 50 to 55% with no significant valvular abnormalities.  LHC showed 70% mid LAD stenosis with diffuse 40% disease extending all the way to the ostium, 60% proximal ramus stenosis, and has no other obstructive disease.  LVEDP was mildly elevated.  Medical management was recommended with addition of Imdur, though he did not tolerate this medication secondary to headache and dizziness.  He was last seen in the office in 07/2022 and was without symptoms of angina or decompensation.  He comes in doing well from a cardiac perspective and is without symptoms of angina or cardiac decompensation.  No dizziness, presyncope, or syncope he remains very active at baseline without cardiac limitation.  He does note some intermittent lower extremity swelling with sock line indentation at the end of the day.  Swelling is resolved earlier in the day conversing in the morning.  No abdominal distention or progressive orthopnea.  Despite staying active throughout the day he does note  some fatigue.  Weight remains stable.  No falls, hematochezia, or melena.  Overall, he feels like he is doing very well.   Labs independently reviewed: 03/2023 - TSH normal, Hgb 12.7, PLT 229, A1c 6.7, TC 79, TG 69, HDL 32, LDL 33, potassium 4.2, BUN 11, serum creatinine 0.68, albumin 4.0, AST/ALT normal  Past Medical History:  Diagnosis Date   Basal cell carcinoma 02/10/2020   SEE MEDIA 8 PAGES OF PREVIOUS BCC   Basal cell carcinoma 08/23/2021   left malar cheek   Basal cell carcinoma 11/14/2022   Right dorsal hand - EDC   Basal cell carcinoma 02/14/2023   left scapula lateral, EDC   Basal cell carcinoma 02/14/2023   left scapula medial, EDC   BCC (basal cell carcinoma of skin) 04/25/2023   right top of shoulder tx with ED&C   BCC (basal cell carcinoma) 03/22/2021   sup- left chst (CX35FU)   BPH (benign prostatic hypertrophy)    CAD (coronary artery disease) 2009   Norman Marks (cardiolite 2009 with 70% stenosis of proximal ramus, not amenable to PCI, rec treat medically) --> Norman Marks   Cancer (HCC)    basal cell skin cancer   Cataract    removed bilaterally   DDD (degenerative disc disease), lumbar    s/p surgery   Diabetes type 2, uncontrolled 1990s   referred to [facility] 09/2015   Diverticulosis    Headache(784.0)    History of adenomatous polyp of colon 10/2012   History of nephrolithiasis 2012  L ureter, R renal pelvis   HLD (hyperlipidemia)    HTN (hypertension)    mild   Hx of migraines    Left bundle branch block    Osteoarthritis of CMC joint of thumb 2014   s/p injections (Sypher)   Osteoarthritis of right shoulder 2015   s/p injections (Sypher)   Osteopenia 06/2011   T score -3, completed 2 yrs forteo, T score improved to -2.2 (2015)   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   in situ (CX35FU)   SCC (squamous cell carcinoma) 08/23/2021   right posterior mandible-CIS   SCC (squamous cell carcinoma) 08/23/2021   right dorsal hand   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   SEE MEDIA 8 PAGES OF PREVIOUS SCC AND TREATMENTS   Vitamin D deficiency    Wears hearing aid in both ears     Past Surgical History:  Procedure Laterality Date   BACK SURGERY  1994, 2008   lumbar; x 3, spinal fusion   CARDIAC CATHETERIZATION  2009   70% stenosis ostium   CARDIOVASCULAR STRESS TEST  2011   fixed defect inferior/septal walls without reversibility, LVEF 43% Marks stress   CATARACT EXTRACTION W/PHACO Right 03/09/2019   Procedure: CATARACT EXTRACTION PHACO AND INTRAOCULAR LENS PLACEMENT (IOC) RIGHT DIABETES;  Surgeon: Norman Crane, MD;  Location: [facility];  Service: Ophthalmology;  Laterality: Right;  Diabetic - insulin and oral meds   CATARACT EXTRACTION W/PHACO Left 04/06/2019   Procedure: CATARACT EXTRACTION PHACO AND INTRAOCULAR LENS PLACEMENT (IOC) LEFT DIABETIC 01:04.2     11.5%      8.58;  Surgeon: Norman Crane, MD;  Location: [facility];  Service: Ophthalmology;  Laterality: Left;  Diabetic - insulin and oral meds   COLONOSCOPY  10/2012   tubular adenoma, mild diverticulosis, rpt 5 yrs Norman Marks)   COLONOSCOPY  12/2017   4 polyps - SSP, TA, diverticulosis (Norman Marks)   COLONOSCOPY  07/2021   TA, diverticulosis, rpt 3 yrs vs none Norman Marks)   dexa  06/2011   osteoporosis T -2.6 spine, -1.9 hip   dexa  07/2013   osteopenia T -2.2 femur, -1.2 spine   FLEXIBLE SIGMOIDOSCOPY  2007   pt reported normal [facility])   FOOT SURGERY Left 08/2017   HAND SURGERY Left 10/2015   [facility] arthritis repair Norman Marks)   KNEE SURGERY Right    LEFT HEART CATH AND CORONARY ANGIOGRAPHY N/A 06/26/2021   Procedure: LEFT HEART CATH AND CORONARY ANGIOGRAPHY;  Surgeon: Norman Ouch, MD;  Location: [facility] IN[facility];  Service: Cardiovascular;  Laterality: N/A;   POLYPECTOMY     SHOULDER SURGERY Right    SKIN SURGERY  2006   skin cancer   TENDON REPAIR  08/2017   TOTAL SHOULDER ARTHROPLASTY Right 01/26/2014   Procedure:  TOTAL SHOULDER ARTHROPLASTY;  Surgeon: Norman Post, MD;  Location: [facility] OR;  Service: Orthopedics;  Laterality: Right;   US ECHOCARDIOGRAPHY  10/2008   nl LV fxn, EF 50%, mild MR, mild pulm HTN    Current Medications: Current Meds  Medication Sig   aspirin 81 MG tablet Take 81 mg by mouth daily.   carvedilol (COREG) 25 MG tablet Take 1 tablet (25 mg total) by mouth 2 (two) times daily with a meal.   Cholecalciferol (VITAMIN D3) 25 MCG (1000 UT) CAPS Take 1 capsule (1,000 Units total) by mouth daily.   finasteride (PROSCAR) 5 MG tablet Take 1 tablet (5 mg total) by  mouth daily.   fluorouracil (EFUDEX) 5 % cream Apply topically 2 (two) times daily. Apply topically twice daily to top of hands for 3 weeks   insulin glargine (LANTUS) 100 UNIT/ML injection Inject 0.26 mLs (26 Units total) into the skin daily.   Insulin Pen Needle (NOVOFINE PLUS PEN NEEDLE) 32G X 4 MM MISC Use to administer Ozempic once a week.   Insulin Syringe-Needle U-100 (TRUEPLUS INSULIN SYRINGE) 31G X 5/16" 1 ML MISC USE TO INJECT INSULIN AS DIRECTED   metFORMIN (GLUCOPHAGE-XR) 500 MG 24 hr tablet Take 2 tablets (1,000 mg total) by mouth daily with breakfast.   ONETOUCH ULTRA test strip CHECK BLOOD SUGAR ONCE OR TWICE DAILY ASDIRECTED   polyethylene glycol (MIRALAX / GLYCOLAX) 17 g packet Take 17 g by mouth every other day.   ramipril (ALTACE) 10 MG capsule Take 1 capsule (10 mg total) by mouth daily.   rosuvastatin (CRESTOR) 20 MG tablet Take 1 tablet (20 mg total) by mouth daily. Overdue follow up visit.  PLEASE CALL OFFICE TO SCHEDULE APPOINTMENT PRIOR TO NEXT REFILL   Semaglutide,0.25 or 0.5MG /DOS, (OZEMPIC, 0.25 OR 0.5 MG/DOSE,) 2 MG/3ML SOPN Inject 0.5 mg into the skin once a week.    Allergies:   Metformin and related and Percocet [oxycodone-acetaminophen]   Social History   Socioeconomic History   Marital status: Married    Spouse name: Not on file   Number of children: Not on file   Years of education: Not  on file   Highest education level: Not on file  Occupational History   Not on file  Tobacco Use   Smoking status: Former    Current packs/day: 0.00    Average packs/day: 0.3 packs/day for 10.0 years (2.5 ttl pk-yrs)    Types: Cigarettes    Start date: 10/21/1982    Quit date: 10/20/1992    Years since quitting: 30.6   Smokeless tobacco: Former    Types: Chew    Quit date: 10/20/1992  Vaping Use   Vaping status: Never Used  Substance and Sexual Activity   Alcohol use: No    Alcohol/week: 0.0 standard drinks of alcohol   Drug use: No   Sexual activity: Not on file  Other Topics Concern   Not on file  Social History Narrative   Caffeine: 1 cup coffee/day   Lives with wife, 1 dog, 5 cats.   Occupation: retired   Edu: 12th grade   Activity: no regular exercise   Diet: good water, fruits/vegetables daily   Social Determinants of Health   Financial Resource Strain: Low Risk  (03/04/2020)   Overall Financial Resource Strain (CARDIA)    Difficulty of Paying Living Expenses: Not hard at all  Food Insecurity: No Food Insecurity (03/04/2020)   Hunger Vital Sign    Worried About Running Out of Food in the Last Year: Never true    Ran Out of Food in the Last Year: Never true  Transportation Needs: Not on file  Physical Activity: Inactive (03/04/2020)   Exercise Vital Sign    Days of Exercise per Week: 0 days    Minutes of Exercise per Session: 0 min  Stress: No Stress Concern Present (03/04/2020)   [employer] of Occupational Health - Occupational Stress Questionnaire    Feeling of Stress : Not at all  Social Connections: Not on file     Family History:  The patient's family history includes Cancer in his father and mother; Coronary artery disease (age of onset: 61) in his  sister; Diabetes in his brother and father; Heart attack (age of onset: 57) in his sister; Heart disease in his mother. There is no history of Stroke, Colon cancer, Colon polyps, Esophageal cancer, Rectal cancer,  or Stomach cancer.  ROS:   12-point review of systems is negative unless otherwise noted in the HPI.   EKGs/Labs/Other Studies Reviewed:    Studies reviewed were summarized above. The additional studies were reviewed today:  2D echo 07/2015: - Left ventricle: The cavity size was normal. There was mild focal    basal and mild concentric hypertrophy of the septum. Systolic    function was normal. The estimated ejection fraction was in the    range of 50% to 55%. Anteroseptal wall motion hypokinesis noted,    unable to exclude dyskinesis, possibly secondary to bundle branch    block. Doppler parameters are consistent with abnormal left    ventricular relaxation (grade 1 diastolic dysfunction).  - Mitral valve: There was mild regurgitation.  - Left atrium: The atrium was mildly dilated.  - Right ventricle: Systolic function was normal.  - Pulmonary arteries: Systolic pressure was mildly elevated. PA    peak pressure: 42 mm Hg (S).   Impressions:   - Murmur possibly secondary to proximal septal thickening. No    significant outflow tract gradient. __________   2D echo 06/20/2021: 1. Left ventricular ejection fraction, by estimation, is 50 to 55%. The  left ventricle has low normal function. Left ventricular endocardial  border not optimally defined to evaluate regional wall motion. Left  ventricular diastolic parameters are  consistent with Grade I diastolic dysfunction (impaired relaxation).  Elevated left atrial pressure.   2. Right ventricular systolic function is normal. The right ventricular  size is normal. Tricuspid regurgitation signal is inadequate for assessing  PA pressure.   3. The mitral valve is grossly normal. Trivial mitral valve  regurgitation.   4. The aortic valve is tricuspid. There is mild calcification of the  aortic valve. There is mild thickening of the aortic valve. Aortic valve  regurgitation is not visualized. Aortic valve sclerosis/calcification is   present, without any evidence of  aortic stenosis.  __________   LHC 06/26/2021:   Mid LAD lesion is 70% stenosed.   Ost LAD to Prox LAD lesion is 40% stenosed.   Ramus lesion is 60% stenosed.   The left ventricular systolic function is normal.   LV end diastolic pressure is mildly elevated.   The left ventricular ejection fraction is 50-55% by visual estimate.   1.  Borderline significant two-vessel coronary artery disease.  Stable proximal ramus stenosis was present on previous cardiac catheterization in 2009.  There is 70% mid LAD stenosis but the proximal LAD to the ostium is moderately diseased as well. 2.  Low normal LV systolic function mildly elevated left ventricular end-diastolic pressure.   Recommendations: Recommend maximizing medical therapy before considering PCI for refractory symptoms.  PCI of the mid LAD can be considered but the proximal and ostial LAD has moderate diffuse disease and might require stenting all the way back to the ostium which is not ideal given the origin of a large ramus branch.  I added Imdur 30 mg daily.   EKG:  EKG is ordered today.  The EKG ordered today demonstrates NSR, 83 bpm, rare PVC, LBBB  Recent Labs: 03/19/2023: ALT 14; BUN 11; Creatinine, Ser 0.68; Potassium 4.2; Sodium 141 03/26/2023: Hemoglobin 12.7; Platelets 229.0; TSH 0.56  Recent Lipid Panel    Component Value Date/Time  CHOL 79 03/19/2023 0807   CHOL 83 (L) 09/06/2021 0821   TRIG 69.0 03/19/2023 0807   HDL 32.10 (L) 03/19/2023 0807   HDL 33 (L) 09/06/2021 0821   CHOLHDL 2 03/19/2023 0807   VLDL 13.8 03/19/2023 0807   LDLCALC 33 03/19/2023 0807   LDLCALC 35 09/06/2021 0821   LDLDIRECT 65.0 01/23/2016 0931    PHYSICAL EXAM:    VS:  BP 120/70 (BP Location: Left Arm, Patient Position: Sitting, Cuff Size: Normal)   Pulse 83   Ht 5\' 9"  (1.753 m)   Wt 144 lb 6 oz (65.5 kg)   BMI 21.32 kg/m   BMI: Body mass index is 21.32 kg/m.  Physical Exam Vitals reviewed.   Constitutional:      Appearance: He is well-developed.  HENT:     Head: Normocephalic and atraumatic.  Eyes:     General:        Right eye: No discharge.        Left eye: No discharge.  Neck:     Vascular: No JVD.  Cardiovascular:     Rate and Rhythm: Normal rate and regular rhythm.     Heart sounds: Normal heart sounds, S1 normal and S2 normal. Heart sounds not distant. No midsystolic click and no opening snap. No murmur heard.    No friction rub.  Pulmonary:     Effort: Pulmonary effort is normal. No respiratory distress.     Breath sounds: Normal breath sounds. No decreased breath sounds, wheezing or rales.  Chest:     Chest wall: No tenderness.  Abdominal:     General: There is no distension.  Musculoskeletal:     Cervical back: Normal range of motion.     Right lower leg: No edema.     Left lower leg: No edema.  Skin:    General: Skin is warm and dry.     Nails: There is no clubbing.  Neurological:     Mental Status: He is alert and oriented to person, place, and time.  Psychiatric:        Speech: Speech normal.        Behavior: Behavior normal.        Thought Content: Thought content normal.        Judgment: Judgment normal.     Wt Readings from Last 3 Encounters:  06/19/23 144 lb 6 oz (65.5 kg)  03/26/23 143 lb 6 oz (65 kg)  09/19/22 149 lb 2 oz (67.6 kg)     ASSESSMENT & PLAN:   CAD involving the native coronary arteries without angina: He is doing very well and without symptoms concerning for angina or cardiac decompensation.  Continue current medical therapy including aspirin 81 mg, carvedilol 25 mg twice daily, ramipril 10 mg, and rosuvastatin 20 mg.  LBBB: Stable and without symptoms of syncope.  Schedule echo.  HTN: Blood pressure is well-controlled in the office today.  Remains on carvedilol 25 mg twice daily and ramipril 10 mg.  Recent labs stable.  HLD: LDL 33 with a triglyceride of 69 and normal ALT/ALT in 03/2023.  Remains on rosuvastatin 20  mg.  Lower extremity swelling: None appreciable on exam today.  Appears to be consistent with venous insufficiency.  Obtain echo as outlined above to evaluate for new cardiomyopathy.  Would defer addition of diuretic.  Recommend leg elevation.    Disposition: F/u with Dr. Kirke Corin or an APP in 12 months, sooner if needed.   Medication Adjustments/Labs and Tests Ordered: Current  medicines are reviewed at length with the patient today.  Concerns regarding medicines are outlined above. Medication changes, Labs and Tests ordered today are summarized above and listed in the Patient Instructions accessible in Encounters.   Signed, Eula Listen, PA-C 06/19/2023 1:23 PM     [facility] [place] [address] [place], [place] [address] [phone]

## 2023-06-19 ENCOUNTER — Encounter: Payer: Self-pay | Admitting: Physician Assistant

## 2023-06-19 ENCOUNTER — Ambulatory Visit: Payer: Medicare Other | Attending: Physician Assistant | Admitting: Physician Assistant

## 2023-06-19 VITALS — BP 120/70 | HR 83 | Ht 69.0 in | Wt 144.4 lb

## 2023-06-19 DIAGNOSIS — I447 Left bundle-branch block, unspecified: Secondary | ICD-10-CM

## 2023-06-19 DIAGNOSIS — I10 Essential (primary) hypertension: Secondary | ICD-10-CM | POA: Diagnosis not present

## 2023-06-19 DIAGNOSIS — I2510 Atherosclerotic heart disease of native coronary artery without angina pectoris: Secondary | ICD-10-CM

## 2023-06-19 DIAGNOSIS — E785 Hyperlipidemia, unspecified: Secondary | ICD-10-CM | POA: Diagnosis not present

## 2023-06-19 DIAGNOSIS — M7989 Other specified soft tissue disorders: Secondary | ICD-10-CM

## 2023-06-19 NOTE — Patient Instructions (Signed)
Medication Instructions:  Your Physician recommend you continue on your current medication as directed.    *If you need a refill on your cardiac medications before your next appointment, please call your pharmacy*   Lab Work: None ordered at this time  If you have labs (blood work) drawn today and your tests are completely normal, you will receive your results only by: MyChart Message (if you have MyChart) OR A paper copy in the mail If you have any lab test that is abnormal or we need to change your treatment, we will call you to review the results.   Testing/Procedures: Your physician has requested that you have an echocardiogram. Echocardiography is a painless test that uses sound waves to create images of your heart. It provides your doctor with information about the size and shape of your heart and how well your heart's chambers and valves are working.   You may receive an ultrasound enhancing agent through an IV if needed to better visualize your heart during the echo. This procedure takes approximately one hour.  There are no restrictions for this procedure.  This will take place at [address] [facility]) [address], [place] [address]  Please note: We ask at that you not bring children with you during ultrasound (echo/ vascular) testing. Due to room size and safety concerns, children are not allowed in the ultrasound rooms during exams. Our front office staff cannot provide observation of children in our lobby area while testing is being conducted. An adult accompanying a patient to their appointment will only be allowed in the ultrasound room at the discretion of the ultrasound technician under special circumstances. We apologize for any inconvenience.   Follow-Up: At [facility], you and your health needs are our priority.  As part of our continuing mission to provide you with exceptional heart care, we have created designated Provider Care Teams.  These  Care Teams include your primary Cardiologist (physician) and Advanced Practice Providers (APPs -  Physician Assistants and Nurse Practitioners) who all work together to provide you with the care you need, when you need it.  We recommend signing up for the patient portal called "MyChart".  Sign up information is provided on this After Visit Summary.  MyChart is used to connect with patients for Virtual Visits (Telemedicine).  Patients are able to view lab/test results, encounter notes, upcoming appointments, etc.  Non-urgent messages can be sent to your provider as well.   To learn more about what you can do with MyChart, go to [email or web address].    Your next appointment:   12 month(s)  Provider:   You may see Lorine Bears, MD or one of the following Advanced Practice Providers on your designated Care Team:   Eula Listen, [place]

## 2023-07-11 ENCOUNTER — Ambulatory Visit: Payer: Medicare Other | Attending: Physician Assistant

## 2023-07-11 DIAGNOSIS — I447 Left bundle-branch block, unspecified: Secondary | ICD-10-CM

## 2023-07-11 DIAGNOSIS — R600 Localized edema: Secondary | ICD-10-CM | POA: Diagnosis not present

## 2023-07-11 DIAGNOSIS — I2510 Atherosclerotic heart disease of native coronary artery without angina pectoris: Secondary | ICD-10-CM

## 2023-07-11 DIAGNOSIS — M7989 Other specified soft tissue disorders: Secondary | ICD-10-CM

## 2023-07-12 LAB — ECHOCARDIOGRAM COMPLETE
AV Mean grad: 4 mm[Hg]
AV Peak grad: 6.8 mm[Hg]
Ao pk vel: 1.3 m/s
Area-P 1/2: 4.96 cm2
S' Lateral: 3.9 cm

## 2023-07-30 ENCOUNTER — Other Ambulatory Visit
Admission: RE | Admit: 2023-07-30 | Discharge: 2023-07-30 | Disposition: A | Discharge status: Home / Self Care | Payer: Medicare Other | Source: Ambulatory Visit | Attending: Cardiovascular Disease | Admitting: Cardiovascular Disease

## 2023-07-30 ENCOUNTER — Ambulatory Visit: Payer: Medicare Other | Attending: Cardiovascular Disease | Admitting: Cardiovascular Disease

## 2023-07-30 ENCOUNTER — Encounter: Payer: Self-pay | Admitting: Cardiovascular Disease

## 2023-07-30 VITALS — BP 140/58 | HR 86 | Ht 68.0 in | Wt 151.1 lb

## 2023-07-30 DIAGNOSIS — E785 Hyperlipidemia, unspecified: Secondary | ICD-10-CM

## 2023-07-30 DIAGNOSIS — I5021 Acute systolic (congestive) heart failure: Secondary | ICD-10-CM | POA: Insufficient documentation

## 2023-07-30 DIAGNOSIS — I10 Essential (primary) hypertension: Secondary | ICD-10-CM | POA: Insufficient documentation

## 2023-07-30 DIAGNOSIS — I25118 Atherosclerotic heart disease of native coronary artery with other forms of angina pectoris: Secondary | ICD-10-CM

## 2023-07-30 DIAGNOSIS — I493 Ventricular premature depolarization: Secondary | ICD-10-CM

## 2023-07-30 LAB — BASIC METABOLIC PANEL
Anion gap: 8 (ref 5–15)
BUN: 12 mg/dL (ref 8–23)
CO2: 29 mmol/L (ref 22–32)
Calcium: 9.4 mg/dL (ref 8.9–10.3)
Chloride: 103 mmol/L (ref 98–111)
Creatinine, Ser: 0.59 mg/dL — ABNORMAL LOW (ref 0.61–1.24)
GFR, Estimated: >60 mL/min (ref >60)
Glucose, Bld: 128 mg/dL — ABNORMAL HIGH (ref 70–99)
Potassium: 3.9 mmol/L (ref 3.5–5.1)
Sodium: 140 mmol/L (ref 135–145)

## 2023-07-30 LAB — CBC
HCT: 37.6 % — ABNORMAL LOW (ref 39.0–52.0)
Hemoglobin: 12.5 g/dL — ABNORMAL LOW (ref 13.0–17.0)
MCH: 31.5 pg (ref 26.0–34.0)
MCHC: 33.2 g/dL (ref 30.0–36.0)
MCV: 94.7 fL (ref 80.0–100.0)
Platelets: 247 10*3/uL (ref 150–400)
RBC: 3.97 MIL/uL — ABNORMAL LOW (ref 4.22–5.81)
RDW: 11.9 % (ref 11.5–15.5)
WBC: 7.6 10*3/uL (ref 4.0–10.5)
nRBC: 0 % (ref 0.0–0.2)

## 2023-07-30 NOTE — H&P (View-Only) (Signed)
 Cardiology Office Note   Date:  07/30/2023   ID:  Norman Marks, DOB January 29, 1946, MRN [id number]  PCP:  Rilla Baller, MD  Cardiologist:   Deatrice Cage, MD   Chief Complaint  Patient presents with   Follow-up    Discuss decrease EF. Meds reviewed verbally with pt.      History of Present Illness: Norman Marks is a 78 y.o. male who presents for a follow-up visit regarding coronary artery disease and left bundle branch block.  He had cardiac catheterization in 2009 which showed one-vessel coronary artery disease involving the proximal ramus (70% stenosis). He is being treated medically. He has known history of hypertension, diabetes and hyperlipidemia.   He had an echocardiogram done in January 2017 which showed an ejection fraction of 50-55%, grade 1 diastolic dysfunction, mildly dilated left atrium, mild mitral regurgitation and mild pulmonary hypertension.  He was seen in 2022 for increased angina.  Cardiac catheterization was done in December  which showed 70% mid LAD stenosis with diffuse 40% disease extending all the way to the ostium, 60% proximal ramus stenosis and no other obstructive disease.  LVEDP was mildly elevated.  I added Imdur  30 mg but he did not tolerate the medication due to headache and dizziness.  An echocardiogram was done recently and was personally reviewed by me.  It showed a drop in ejection fraction to 40 to 45% with severe anteroseptal hypokinesis that seems to be more pronounced than a left bundle branch block defect. He reports occasional episodes of sharp chest pain.  In addition, he noticed increased exertional dyspnea, few pounds of weight gain and increased lower extremity edema.    Past Medical History:  Diagnosis Date   Basal cell carcinoma 02/10/2020   SEE MEDIA 8 PAGES OF PREVIOUS BCC   Basal cell carcinoma 08/23/2021   left malar cheek   Basal cell carcinoma 11/14/2022   Right dorsal hand - EDC   Basal cell carcinoma 02/14/2023    left scapula lateral, EDC   Basal cell carcinoma 02/14/2023   left scapula medial, EDC   BCC (basal cell carcinoma of skin) 04/25/2023   right top of shoulder tx with ED&C   BCC (basal cell carcinoma) 03/22/2021   sup- left chst (CX35FU)   BPH (benign prostatic hypertrophy)    CAD (coronary artery disease) 2009   [name] (cardiolite 2009 with 70% stenosis of proximal ramus, not amenable to PCI, rec treat medically) --> [name]   Cancer (HCC)    basal cell skin cancer   Cataract    removed bilaterally   DDD (degenerative disc disease), lumbar    s/p surgery   Diabetes type 2, uncontrolled 1990s   referred to [facility] 09/2015   Diverticulosis    Headache(784.0)    History of adenomatous polyp of colon 10/2012   History of nephrolithiasis 2012   L ureter, R renal pelvis   HLD (hyperlipidemia)    HTN (hypertension)    mild   Hx of migraines    Left bundle branch block    Osteoarthritis of CMC joint of thumb 2014   s/p injections (Sypher)   Osteoarthritis of right shoulder 2015   s/p injections (Sypher)   Osteopenia 06/2011   T score -3, completed 2 yrs forteo , T score improved to -2.2 (2015)   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   in situ (CX35FU)   SCC (squamous cell carcinoma) 08/23/2021   right posterior mandible-CIS   SCC (  squamous cell carcinoma) 08/23/2021   right dorsal hand   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   SEE MEDIA 8 PAGES OF PREVIOUS SCC AND TREATMENTS   Vitamin D  deficiency    Wears hearing aid in both ears     Past Surgical History:  Procedure Laterality Date   BACK SURGERY  1994, 2008   lumbar; x 3, spinal fusion   CARDIAC CATHETERIZATION  2009   70% stenosis ostium   CARDIOVASCULAR STRESS TEST  2011   fixed defect inferior/septal walls without reversibility, LVEF 43% post stress   CATARACT EXTRACTION W/PHACO Right 03/09/2019   Procedure: CATARACT EXTRACTION PHACO AND INTRAOCULAR LENS PLACEMENT (IOC) RIGHT  DIABETES;  Surgeon: [name], MD;  Location: [facility];  Service: Ophthalmology;  Laterality: Right;  Diabetic - insulin  and oral meds   CATARACT EXTRACTION W/PHACO Left 04/06/2019   Procedure: CATARACT EXTRACTION PHACO AND INTRAOCULAR LENS PLACEMENT (IOC) LEFT DIABETIC 01:04.2     11.5%      8.58;  Surgeon: [name], MD;  Location: [facility];  Service: Ophthalmology;  Laterality: Left;  Diabetic - insulin  and oral meds   COLONOSCOPY  10/2012   tubular adenoma, mild diverticulosis, rpt 5 yrs Verda)   COLONOSCOPY  12/2017   4 polyps - SSP, TA, diverticulosis (Pyrtle)   COLONOSCOPY  07/2021   TA, diverticulosis, rpt 3 yrs vs none Oletta)   dexa  06/2011   osteoporosis T -2.6 spine, -1.9 hip   dexa  07/2013   osteopenia T -2.2 femur, -1.2 spine   FLEXIBLE SIGMOIDOSCOPY  2007   pt reported normal [facility])   FOOT SURGERY Left 08/2017   HAND SURGERY Left 10/2015   CMC arthritis repair Adolphus)   KNEE SURGERY Right    LEFT HEART CATH AND CORONARY ANGIOGRAPHY N/A 06/26/2021   Procedure: LEFT HEART CATH AND CORONARY ANGIOGRAPHY;  Surgeon: [name], MD;  Location: [facility];  Service: Cardiovascular;  Laterality: N/A;   POLYPECTOMY     SHOULDER SURGERY Right    SKIN SURGERY  2006   skin cancer   TENDON REPAIR  08/2017   TOTAL SHOULDER ARTHROPLASTY Right 01/26/2014   Procedure: TOTAL SHOULDER ARTHROPLASTY;  Surgeon: [name], MD;  Location: [facility] OR;  Service: Orthopedics;  Laterality: Right;   US  ECHOCARDIOGRAPHY  10/2008   nl LV fxn, EF 50%, mild MR, mild pulm HTN     Current Outpatient Medications  Medication Sig Dispense Refill   aspirin  81 MG tablet Take 81 mg by mouth daily.     carvedilol  (COREG ) 25 MG tablet Take 1 tablet (25 mg total) by mouth 2 (two) times daily with a meal. 180 tablet 3   Cholecalciferol (VITAMIN D3) 25 MCG (1000 UT) CAPS Take 1 capsule (1,000 Units total) by mouth daily. 30 capsule     finasteride  (PROSCAR ) 5 MG tablet Take 1 tablet (5 mg total) by mouth daily. 30 tablet 7   fluorouracil  (EFUDEX ) 5 % cream Apply topically 2 (two) times daily. Apply topically twice daily to top of hands for 3 weeks 40 g 1   insulin  glargine (LANTUS ) 100 UNIT/ML injection Inject 0.26 mLs (26 Units total) into the skin daily. 30 mL 2   Insulin  Pen Needle (NOVOFINE PLUS PEN NEEDLE) 32G X 4 MM MISC Use to administer Ozempic  once a week. 30 each 0   Insulin  Syringe-Needle U-100 (TRUEPLUS INSULIN  SYRINGE) 31G X 5/16 1 ML MISC USE TO INJECT INSULIN  AS DIRECTED  100 each 4   metFORMIN  (GLUCOPHAGE -XR) 500 MG 24 hr tablet Take 2 tablets (1,000 mg total) by mouth daily with breakfast. 180 tablet 4   ONETOUCH ULTRA test strip CHECK BLOOD SUGAR ONCE OR TWICE DAILY ASDIRECTED 100 each 3   polyethylene glycol (MIRALAX  / GLYCOLAX ) 17 g packet Take 17 g by mouth every other day.     ramipril  (ALTACE ) 10 MG capsule Take 1 capsule (10 mg total) by mouth daily. 90 capsule 4   rosuvastatin  (CRESTOR ) 20 MG tablet Take 1 tablet (20 mg total) by mouth daily. Overdue follow up visit.  PLEASE CALL OFFICE TO SCHEDULE APPOINTMENT PRIOR TO NEXT REFILL 90 tablet 0   Semaglutide ,0.25 or 0.5MG /DOS, (OZEMPIC , 0.25 OR 0.5 MG/DOSE,) 2 MG/3ML SOPN Inject 0.5 mg into the skin once a week. 3 mL 11   No current facility-administered medications for this visit.    Allergies:   Metformin  and related and Percocet [oxycodone -acetaminophen ]    Social History:  The patient  reports that he quit smoking about 30 years ago. His smoking use included cigarettes. He started smoking about 40 years ago. He has a 2.5 pack-year smoking history. He quit smokeless tobacco use about 30 years ago.  His smokeless tobacco use included chew. He reports that he does not drink alcohol and does not use drugs.   Family History:  The patient's family history includes Cancer in his father and mother; Coronary artery disease (age of onset: 47) in his sister;  Diabetes in his brother and father; Heart attack (age of onset: 51) in his sister; Heart disease in his mother.    ROS:  Please see the history of present illness.   Otherwise, review of systems are positive for none.   All other systems are reviewed and negative.    PHYSICAL EXAM: VS:  BP (!) 140/58 (BP Location: Left Arm, Patient Position: Sitting, Cuff Size: Normal)   Pulse 66   Ht 5' 8 (1.727 m)   Wt 151 lb 2 oz (68.5 kg)   SpO2 98%   BMI 22.98 kg/m  , BMI Body mass index is 22.98 kg/m. GEN: Well nourished, well developed, in no acute distress  HEENT: normal  Neck: no JVD, carotid bruits, or masses Cardiac: RRR; no murmurs, rubs, or gallops,no edema  Respiratory:  clear to auscultation bilaterally, normal work of breathing GI: soft, nontender, nondistended, + BS MS: no deformity or atrophy  Skin: warm and dry, no rash Neuro:  Strength and sensation are intact Psych: euthymic mood, full affect   EKG:  EKG is  ordered today. EKG showed: Sinus rhythm with frequent Premature ventricular complexes Left axis deviation Left bundle branch block When compared with ECG of 19-Jun-2023 11:10, Premature ventricular complexes are now Present    Recent Labs: 03/19/2023: ALT 14; BUN 11; Creatinine, Ser 0.68; Potassium 4.2; Sodium 141 03/26/2023: Hemoglobin 12.7; Platelets 229.0; TSH 0.56    Lipid Panel    Component Value Date/Time   CHOL 79 03/19/2023 0807   CHOL 83 (L) 09/06/2021 0821   TRIG 69.0 03/19/2023 0807   HDL 32.10 (L) 03/19/2023 0807   HDL 33 (L) 09/06/2021 0821   CHOLHDL 2 03/19/2023 0807   VLDL 13.8 03/19/2023 0807   LDLCALC 33 03/19/2023 0807   LDLCALC 35 09/06/2021 0821   LDLDIRECT 65.0 01/23/2016 0931      Wt Readings from Last 3 Encounters:  07/30/23 151 lb 2 oz (68.5 kg)  06/19/23 144 lb 6 oz (65.5 kg)  03/26/23 143 lb  6 oz (65 kg)        ASSESSMENT AND PLAN:  1.  Coronary artery disease involving native coronary arteries with worsening angina:  The patient is having signs of heart failure with a recent echocardiogram showing a drop in ejection fraction to 40 to 45% with significant wall motion abnormality in the LAD distribution.  I am concerned about progression of LAD disease.  Thus, I recommend proceeding with right and left cardiac catheterization and possible PCI.  I discussed the procedure in details as well as risk and benefits.    2.  Acute systolic heart failure with an EF of 40 to 45% with underlying left bundle branch block: He seems to be mildly volume overloaded and this will be evaluated with right heart catheterization.  Continue carvedilol and ramipril for now.  3. Essential hypertension: Blood pressure is mildly elevated.  Will consider adding spironolactone.  4. Hyperlipidemia: He is tolerating rosuvastatin 20 mg daily very well.  Most recent lipid profile showed an LDL of 33.  5.  Frequent PVCs: Noted by exam and by EKG.  This could be due to cardiomyopathy.  Will consider an outpatient monitor to quantify the burden and see if there is a need for an antiarrhythmic medication especially that he is on the maximal dose of carvedilol.   Disposition: Proceed with a right and left cardiac catheterization.  Follow-up 2 weeks after.  Signed,  Lorine Bears, MD  07/30/2023 4:47 PM    [facility]

## 2023-07-30 NOTE — Patient Instructions (Addendum)
 Medication Instructions:  No changes *If you need a refill on your cardiac medications before your next appointment, please call your pharmacy*   Testing/Procedures: Your physician has requested that you have a cardiac catheterization. Cardiac catheterization is used to diagnose and/or treat various heart conditions. Doctors may recommend this procedure for a number of different reasons. The most common reason is to evaluate chest pain. Chest pain can be a symptom of coronary artery disease (CAD), and cardiac catheterization can show whether plaque is narrowing or blocking your heart's arteries. This procedure is also used to evaluate the valves, as well as measure the blood flow and oxygen levels in different parts of your heart. For further information please visit [email or web address] Please follow instruction sheet, as given.    Follow-Up: At [facility], you and your health needs are our priority.  As part of our continuing mission to provide you with exceptional heart care, we have created designated Provider Care Teams.  These Care Teams include your primary Cardiologist (physician) and Advanced Practice Providers (APPs -  Physician Assistants and Nurse Practitioners) who all work together to provide you with the care you need, when you need it.  We recommend signing up for the patient portal called MyChart.  Sign up information is provided on this After Visit Summary.  MyChart is used to connect with patients for Virtual Visits (Telemedicine).  Patients are able to view lab/test results, encounter notes, upcoming appointments, etc.  Non-urgent messages can be sent to your provider as well.   To learn more about what you can do with MyChart, go to [email or web address].    Your next appointment:   2 week(s)  Provider:   Bernardino Bring, PA-C    Other Instructions  [facility] A DEPT OF [place]. [facility] AT [facility] [address] [place], [address] [place] [address] Dept: [phone] Loc: [phone]  Norman Marks  07/30/2023  You are scheduled for a Cardiac Catheterization on Monday, January 20 with Dr. Deatrice Cage.  1. Please arrive at the [facility] Entrance of [facility], [address], [place] [address] at 10:00 AM (This is 1 hour(s) prior to your procedure time).  Proceed to the Check-In Desk directly inside the entrance.  Procedure Parking: Use the entrance off of the [address] side of the hospital. Turn right upon entering and follow the driveway to parking that is directly in front of the [facility]. There is no valet parking available at this entrance, however there is an awning directly in front of the [facility] Center for drop off/ pick up for patients.  Special note: Every effort is made to have your procedure done on time. Please understand that emergencies sometimes delay scheduled procedures.  2. Diet: Do not eat solid foods after midnight.  The patient may have clear liquids until 5am upon the day of the procedure.  3. Labs: You will need to have blood drawn by 08/01/23. You do not need to be fasting.  Please go to [facility] [address], [place] [address] You do not need an appointment.  They are open from 8 am- 5 pm.  Lunch from 1:00 pm- 2:00 pm You will not need to be fasting.  4. Medication instructions in preparation for your procedure: Hold all diabetic medication the morning of the procedure  - hold the metformin  the morning of and then 48 hours after  the procedure  -take half the dose of the Lantus  the night before the procedure   On the morning of your procedure, take your Aspirin  81 mg and any morning medicines NOT listed above.  You may use sips of water .  5. Plan to go home the same day, you will only stay overnight if medically necessary. 6. Bring a current list of your  medications and current insurance cards. 7. You MUST have a responsible person to drive you home. 8. Someone MUST be with you the first 24 hours after you arrive home or your discharge will be delayed. 9. Please wear clothes that are easy to get on and off and wear slip-on shoes.  Thank you for allowing us  to care for you!   -- [facility]

## 2023-07-30 NOTE — Progress Notes (Signed)
 Cardiology Office Note   Date:  07/30/2023   ID:  Norman Marks, DOB January 29, 1946, MRN [id number]  PCP:  Rilla Baller, MD  Cardiologist:   Deatrice Cage, MD   Chief Complaint  Patient presents with   Follow-up    Discuss decrease EF. Meds reviewed verbally with pt.      History of Present Illness: Norman Marks is a 78 y.o. male who presents for a follow-up visit regarding coronary artery disease and left bundle branch block.  He had cardiac catheterization in 2009 which showed one-vessel coronary artery disease involving the proximal ramus (70% stenosis). He is being treated medically. He has known history of hypertension, diabetes and hyperlipidemia.   He had an echocardiogram done in January 2017 which showed an ejection fraction of 50-55%, grade 1 diastolic dysfunction, mildly dilated left atrium, mild mitral regurgitation and mild pulmonary hypertension.  He was seen in 2022 for increased angina.  Cardiac catheterization was done in December  which showed 70% mid LAD stenosis with diffuse 40% disease extending all the way to the ostium, 60% proximal ramus stenosis and no other obstructive disease.  LVEDP was mildly elevated.  I added Imdur  30 mg but he did not tolerate the medication due to headache and dizziness.  An echocardiogram was done recently and was personally reviewed by me.  It showed a drop in ejection fraction to 40 to 45% with severe anteroseptal hypokinesis that seems to be more pronounced than a left bundle branch block defect. He reports occasional episodes of sharp chest pain.  In addition, he noticed increased exertional dyspnea, few pounds of weight gain and increased lower extremity edema.    Past Medical History:  Diagnosis Date   Basal cell carcinoma 02/10/2020   SEE MEDIA 8 PAGES OF PREVIOUS BCC   Basal cell carcinoma 08/23/2021   left malar cheek   Basal cell carcinoma 11/14/2022   Right dorsal hand - EDC   Basal cell carcinoma 02/14/2023    left scapula lateral, EDC   Basal cell carcinoma 02/14/2023   left scapula medial, EDC   BCC (basal cell carcinoma of skin) 04/25/2023   right top of shoulder tx with ED&C   BCC (basal cell carcinoma) 03/22/2021   sup- left chst (CX35FU)   BPH (benign prostatic hypertrophy)    CAD (coronary artery disease) 2009   [name] (cardiolite 2009 with 70% stenosis of proximal ramus, not amenable to PCI, rec treat medically) --> Norman Marks   Cancer (HCC)    basal cell skin cancer   Cataract    removed bilaterally   DDD (degenerative disc disease), lumbar    s/p surgery   Diabetes type 2, uncontrolled 1990s   referred to [facility] 09/2015   Diverticulosis    Headache(784.0)    History of adenomatous polyp of colon 10/2012   History of nephrolithiasis 2012   L ureter, R renal pelvis   HLD (hyperlipidemia)    HTN (hypertension)    mild   Hx of migraines    Left bundle branch block    Osteoarthritis of CMC joint of thumb 2014   s/p injections (Sypher)   Osteoarthritis of right shoulder 2015   s/p injections (Sypher)   Osteopenia 06/2011   T score -3, completed 2 yrs forteo , T score improved to -2.2 (2015)   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   in situ (CX35FU)   SCC (squamous cell carcinoma) 08/23/2021   right posterior mandible-CIS   SCC (  squamous cell carcinoma) 08/23/2021   right dorsal hand   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   SEE MEDIA 8 PAGES OF PREVIOUS SCC AND TREATMENTS   Vitamin D  deficiency    Wears hearing aid in both ears     Past Surgical History:  Procedure Laterality Date   BACK SURGERY  1994, 2008   lumbar; x 3, spinal fusion   CARDIAC CATHETERIZATION  2009   70% stenosis ostium   CARDIOVASCULAR STRESS TEST  2011   fixed defect inferior/septal walls without reversibility, LVEF 43% post stress   CATARACT EXTRACTION W/PHACO Right 03/09/2019   Procedure: CATARACT EXTRACTION PHACO AND INTRAOCULAR LENS PLACEMENT (IOC) RIGHT  DIABETES;  Surgeon: [name], MD;  Location: [facility];  Service: Ophthalmology;  Laterality: Right;  Diabetic - insulin  and oral meds   CATARACT EXTRACTION W/PHACO Left 04/06/2019   Procedure: CATARACT EXTRACTION PHACO AND INTRAOCULAR LENS PLACEMENT (IOC) LEFT DIABETIC 01:04.2     11.5%      8.58;  Surgeon: [name], MD;  Location: [facility];  Service: Ophthalmology;  Laterality: Left;  Diabetic - insulin  and oral meds   COLONOSCOPY  10/2012   tubular adenoma, mild diverticulosis, rpt 5 yrs Verda)   COLONOSCOPY  12/2017   4 polyps - SSP, TA, diverticulosis (Pyrtle)   COLONOSCOPY  07/2021   TA, diverticulosis, rpt 3 yrs vs none Oletta)   dexa  06/2011   osteoporosis T -2.6 spine, -1.9 hip   dexa  07/2013   osteopenia T -2.2 femur, -1.2 spine   FLEXIBLE SIGMOIDOSCOPY  2007   pt reported normal [facility])   FOOT SURGERY Left 08/2017   HAND SURGERY Left 10/2015   CMC arthritis repair Adolphus)   KNEE SURGERY Right    LEFT HEART CATH AND CORONARY ANGIOGRAPHY N/A 06/26/2021   Procedure: LEFT HEART CATH AND CORONARY ANGIOGRAPHY;  Surgeon: [name], MD;  Location: [facility];  Service: Cardiovascular;  Laterality: N/A;   POLYPECTOMY     SHOULDER SURGERY Right    SKIN SURGERY  2006   skin cancer   TENDON REPAIR  08/2017   TOTAL SHOULDER ARTHROPLASTY Right 01/26/2014   Procedure: TOTAL SHOULDER ARTHROPLASTY;  Surgeon: [name], MD;  Location: [facility] OR;  Service: Orthopedics;  Laterality: Right;   US  ECHOCARDIOGRAPHY  10/2008   nl LV fxn, EF 50%, mild MR, mild pulm HTN     Current Outpatient Medications  Medication Sig Dispense Refill   aspirin  81 MG tablet Take 81 mg by mouth daily.     carvedilol  (COREG ) 25 MG tablet Take 1 tablet (25 mg total) by mouth 2 (two) times daily with a meal. 180 tablet 3   Cholecalciferol (VITAMIN D3) 25 MCG (1000 UT) CAPS Take 1 capsule (1,000 Units total) by mouth daily. 30 capsule     finasteride  (PROSCAR ) 5 MG tablet Take 1 tablet (5 mg total) by mouth daily. 30 tablet 7   fluorouracil  (EFUDEX ) 5 % cream Apply topically 2 (two) times daily. Apply topically twice daily to top of hands for 3 weeks 40 g 1   insulin  glargine (LANTUS ) 100 UNIT/ML injection Inject 0.26 mLs (26 Units total) into the skin daily. 30 mL 2   Insulin  Pen Needle (NOVOFINE PLUS PEN NEEDLE) 32G X 4 MM MISC Use to administer Ozempic  once a week. 30 each 0   Insulin  Syringe-Needle U-100 (TRUEPLUS INSULIN  SYRINGE) 31G X 5/16 1 ML MISC USE TO INJECT INSULIN  AS DIRECTED  100 each 4   metFORMIN  (GLUCOPHAGE -XR) 500 MG 24 hr tablet Take 2 tablets (1,000 mg total) by mouth daily with breakfast. 180 tablet 4   ONETOUCH ULTRA test strip CHECK BLOOD SUGAR ONCE OR TWICE DAILY ASDIRECTED 100 each 3   polyethylene glycol (MIRALAX  / GLYCOLAX ) 17 g packet Take 17 g by mouth every other day.     ramipril  (ALTACE ) 10 MG capsule Take 1 capsule (10 mg total) by mouth daily. 90 capsule 4   rosuvastatin  (CRESTOR ) 20 MG tablet Take 1 tablet (20 mg total) by mouth daily. Overdue follow up visit.  PLEASE CALL OFFICE TO SCHEDULE APPOINTMENT PRIOR TO NEXT REFILL 90 tablet 0   Semaglutide ,0.25 or 0.5MG /DOS, (OZEMPIC , 0.25 OR 0.5 MG/DOSE,) 2 MG/3ML SOPN Inject 0.5 mg into the skin once a week. 3 mL 11   No current facility-administered medications for this visit.    Allergies:   Metformin  and related and Percocet [oxycodone -acetaminophen ]    Social History:  The patient  reports that he quit smoking about 30 years ago. His smoking use included cigarettes. He started smoking about 40 years ago. He has a 2.5 pack-year smoking history. He quit smokeless tobacco use about 30 years ago.  His smokeless tobacco use included chew. He reports that he does not drink alcohol and does not use drugs.   Family History:  The patient's family history includes Cancer in his father and mother; Coronary artery disease (age of onset: 47) in his sister;  Diabetes in his brother and father; Heart attack (age of onset: 51) in his sister; Heart disease in his mother.    ROS:  Please see the history of present illness.   Otherwise, review of systems are positive for none.   All other systems are reviewed and negative.    PHYSICAL EXAM: VS:  BP (!) 140/58 (BP Location: Left Arm, Patient Position: Sitting, Cuff Size: Normal)   Pulse 66   Ht 5' 8 (1.727 m)   Wt 151 lb 2 oz (68.5 kg)   SpO2 98%   BMI 22.98 kg/m  , BMI Body mass index is 22.98 kg/m. GEN: Well nourished, well developed, in no acute distress  HEENT: normal  Neck: no JVD, carotid bruits, or masses Cardiac: RRR; no murmurs, rubs, or gallops,no edema  Respiratory:  clear to auscultation bilaterally, normal work of breathing GI: soft, nontender, nondistended, + BS MS: no deformity or atrophy  Skin: warm and dry, no rash Neuro:  Strength and sensation are intact Psych: euthymic mood, full affect   EKG:  EKG is  ordered today. EKG showed: Sinus rhythm with frequent Premature ventricular complexes Left axis deviation Left bundle branch block When compared with ECG of 19-Jun-2023 11:10, Premature ventricular complexes are now Present    Recent Labs: 03/19/2023: ALT 14; BUN 11; Creatinine, Ser 0.68; Potassium 4.2; Sodium 141 03/26/2023: Hemoglobin 12.7; Platelets 229.0; TSH 0.56    Lipid Panel    Component Value Date/Time   CHOL 79 03/19/2023 0807   CHOL 83 (L) 09/06/2021 0821   TRIG 69.0 03/19/2023 0807   HDL 32.10 (L) 03/19/2023 0807   HDL 33 (L) 09/06/2021 0821   CHOLHDL 2 03/19/2023 0807   VLDL 13.8 03/19/2023 0807   LDLCALC 33 03/19/2023 0807   LDLCALC 35 09/06/2021 0821   LDLDIRECT 65.0 01/23/2016 0931      Wt Readings from Last 3 Encounters:  07/30/23 151 lb 2 oz (68.5 kg)  06/19/23 144 lb 6 oz (65.5 kg)  03/26/23 143 lb  6 oz (65 kg)        ASSESSMENT AND PLAN:  1.  Coronary artery disease involving native coronary arteries with worsening angina:  The patient is having signs of heart failure with a recent echocardiogram showing a drop in ejection fraction to 40 to 45% with significant wall motion abnormality in the LAD distribution.  I am concerned about progression of LAD disease.  Thus, I recommend proceeding with right and left cardiac catheterization and possible PCI.  I discussed the procedure in details as well as risk and benefits.    2.  Acute systolic heart failure with an EF of 40 to 45% with underlying left bundle branch block: He seems to be mildly volume overloaded and this will be evaluated with right heart catheterization.  Continue carvedilol  and ramipril  for now.  3. Essential hypertension: Blood pressure is mildly elevated.  Will consider adding spironolactone.  4. Hyperlipidemia: He is tolerating rosuvastatin  20 mg daily very well.  Most recent lipid profile showed an LDL of 33.  5.  Frequent PVCs: Noted by exam and by EKG.  This could be due to cardiomyopathy.  Will consider an outpatient monitor to quantify the burden and see if there is a need for an antiarrhythmic medication especially that he is on the maximal dose of carvedilol .   Disposition: Proceed with a right and left cardiac catheterization.  Follow-up 2 weeks after.  Signed,  Deatrice Cage, MD  07/30/2023 4:47 PM    [facility]

## 2023-07-31 ENCOUNTER — Other Ambulatory Visit: Payer: Self-pay | Admitting: Physician Assistant

## 2023-08-01 LAB — HM DIABETES EYE EXAM

## 2023-08-05 ENCOUNTER — Other Ambulatory Visit: Payer: Self-pay

## 2023-08-05 ENCOUNTER — Encounter: Payer: Self-pay | Admitting: Cardiovascular Disease

## 2023-08-05 ENCOUNTER — Encounter
Admission: RE | Disposition: A | Discharge status: Home / Self Care | Payer: Self-pay | Source: Ambulatory Visit | Attending: Cardiovascular Disease

## 2023-08-05 ENCOUNTER — Ambulatory Visit
Admission: RE | Admit: 2023-08-05 | Discharge: 2023-08-05 | Disposition: A | Discharge status: Home / Self Care | Payer: Medicare Other | Source: Ambulatory Visit | Attending: Cardiovascular Disease | Admitting: Cardiovascular Disease

## 2023-08-05 DIAGNOSIS — I110 Hypertensive heart disease with heart failure: Secondary | ICD-10-CM | POA: Insufficient documentation

## 2023-08-05 DIAGNOSIS — Z8249 Family history of ischemic heart disease and other diseases of the circulatory system: Secondary | ICD-10-CM | POA: Insufficient documentation

## 2023-08-05 DIAGNOSIS — Z87891 Personal history of nicotine dependence: Secondary | ICD-10-CM | POA: Insufficient documentation

## 2023-08-05 DIAGNOSIS — I5022 Chronic systolic (congestive) heart failure: Secondary | ICD-10-CM

## 2023-08-05 DIAGNOSIS — E785 Hyperlipidemia, unspecified: Secondary | ICD-10-CM | POA: Diagnosis not present

## 2023-08-05 DIAGNOSIS — Z79899 Other long term (current) drug therapy: Secondary | ICD-10-CM | POA: Insufficient documentation

## 2023-08-05 DIAGNOSIS — I25118 Atherosclerotic heart disease of native coronary artery with other forms of angina pectoris: Secondary | ICD-10-CM

## 2023-08-05 DIAGNOSIS — I493 Ventricular premature depolarization: Secondary | ICD-10-CM | POA: Diagnosis not present

## 2023-08-05 DIAGNOSIS — I25110 Atherosclerotic heart disease of native coronary artery with unstable angina pectoris: Secondary | ICD-10-CM | POA: Insufficient documentation

## 2023-08-05 DIAGNOSIS — I5021 Acute systolic (congestive) heart failure: Secondary | ICD-10-CM | POA: Insufficient documentation

## 2023-08-05 DIAGNOSIS — I5020 Unspecified systolic (congestive) heart failure: Secondary | ICD-10-CM

## 2023-08-05 DIAGNOSIS — I447 Left bundle-branch block, unspecified: Secondary | ICD-10-CM | POA: Diagnosis not present

## 2023-08-05 HISTORY — PX: RIGHT/LEFT HEART CATH AND CORONARY ANGIOGRAPHY: CATH118266

## 2023-08-05 LAB — POCT I-STAT 7, (LYTES, BLD GAS, ICA,H+H)
Acid-Base Excess: 1 mmol/L (ref 0.0–2.0)
Bicarbonate: 26.4 mmol/L (ref 20.0–28.0)
Calcium, Ion: 1.25 mmol/L (ref 1.15–1.40)
HCT: 34 % — ABNORMAL LOW (ref 39.0–52.0)
Hemoglobin: 11.6 g/dL — ABNORMAL LOW (ref 13.0–17.0)
O2 Saturation: 96 %
Potassium: 3.8 mmol/L (ref 3.5–5.1)
Sodium: 142 mmol/L (ref 135–145)
TCO2: 28 mmol/L (ref 22–32)
pCO2 arterial: 46 mm[Hg] (ref 32–48)
pH, Arterial: 7.367 (ref 7.35–7.45)
pO2, Arterial: 85 mm[Hg] (ref 83–108)

## 2023-08-05 LAB — POCT I-STAT EG7
Acid-Base Excess: 1 mmol/L (ref 0.0–2.0)
Bicarbonate: 27.8 mmol/L (ref 20.0–28.0)
Calcium, Ion: 1.29 mmol/L (ref 1.15–1.40)
HCT: 35 % — ABNORMAL LOW (ref 39.0–52.0)
Hemoglobin: 11.9 g/dL — ABNORMAL LOW (ref 13.0–17.0)
O2 Saturation: 70 %
Potassium: 3.9 mmol/L (ref 3.5–5.1)
Sodium: 143 mmol/L (ref 135–145)
TCO2: 29 mmol/L (ref 22–32)
pCO2, Ven: 50.7 mm[Hg] (ref 44–60)
pH, Ven: 7.347 (ref 7.25–7.43)
pO2, Ven: 39 mm[Hg] (ref 32–45)

## 2023-08-05 LAB — GLUCOSE, CAPILLARY: Glucose-Capillary: 111 mg/dL — ABNORMAL HIGH (ref 70–99)

## 2023-08-05 SURGERY — RIGHT/LEFT HEART CATH AND CORONARY ANGIOGRAPHY
Anesthesia: Moderate Sedation | Laterality: Bilateral

## 2023-08-05 MED ORDER — ACETAMINOPHEN 325 MG PO TABS: 650.0000 mg | ORAL_TABLET | ORAL | Status: DC | PRN

## 2023-08-05 MED ORDER — ASPIRIN 81 MG PO CHEW: 81.0000 mg | CHEWABLE_TABLET | ORAL | Status: DC

## 2023-08-05 MED ORDER — FENTANYL CITRATE (PF) 100 MCG/2ML IJ SOLN
INTRAMUSCULAR | Status: DC | PRN
  Administered 2023-08-05: 25 ug via INTRAVENOUS

## 2023-08-05 MED ORDER — SODIUM CHLORIDE 0.9% FLUSH: 3.0000 mL | INTRAVENOUS | Status: DC | PRN

## 2023-08-05 MED ORDER — ONDANSETRON HCL 4 MG/2ML IJ SOLN: 4.0000 mg | Freq: Four times a day (QID) | INTRAMUSCULAR | Status: DC | PRN

## 2023-08-05 MED ORDER — LIDOCAINE HCL (PF) 1 % IJ SOLN
INTRAMUSCULAR | Status: DC | PRN
  Administered 2023-08-05: 2 mL

## 2023-08-05 MED ORDER — HEPARIN (PORCINE) IN NACL 1000-0.9 UT/500ML-% IV SOLN
INTRAVENOUS | Status: DC | PRN
  Administered 2023-08-05 (×2): 500 mL

## 2023-08-05 MED ORDER — VERAPAMIL HCL 2.5 MG/ML IV SOLN
INTRAVENOUS | Status: DC | PRN
  Administered 2023-08-05: 2.5 mg via INTRAVENOUS

## 2023-08-05 MED ORDER — MIDAZOLAM HCL 2 MG/2ML IJ SOLN
INTRAMUSCULAR | Status: AC
  Filled 2023-08-05: qty 2

## 2023-08-05 MED ORDER — VERAPAMIL HCL 2.5 MG/ML IV SOLN
INTRAVENOUS | Status: AC
  Filled 2023-08-05: qty 2

## 2023-08-05 MED ORDER — MIDAZOLAM HCL 2 MG/2ML IJ SOLN
INTRAMUSCULAR | Status: DC | PRN
  Administered 2023-08-05: 1 mg via INTRAVENOUS

## 2023-08-05 MED ORDER — SODIUM CHLORIDE 0.9 % IV SOLN
250.0000 mL | INTRAVENOUS | Status: DC | PRN
Start: 2023-08-05 — End: 2023-08-05

## 2023-08-05 MED ORDER — HEPARIN (PORCINE) IN NACL 1000-0.9 UT/500ML-% IV SOLN
INTRAVENOUS | Status: AC
  Filled 2023-08-05: qty 1000

## 2023-08-05 MED ORDER — SODIUM CHLORIDE 0.9 % IV SOLN: INTRAVENOUS | Status: DC

## 2023-08-05 MED ORDER — HEPARIN SODIUM (PORCINE) 1000 UNIT/ML IJ SOLN
INTRAMUSCULAR | Status: AC
  Filled 2023-08-05: qty 10

## 2023-08-05 MED ORDER — IOHEXOL 300 MG/ML  SOLN
INTRAMUSCULAR | Status: DC | PRN
  Administered 2023-08-05: 50 mL

## 2023-08-05 MED ORDER — FENTANYL CITRATE (PF) 100 MCG/2ML IJ SOLN
INTRAMUSCULAR | Status: AC
  Filled 2023-08-05: qty 2

## 2023-08-05 MED ORDER — HEPARIN SODIUM (PORCINE) 1000 UNIT/ML IJ SOLN
INTRAMUSCULAR | Status: DC | PRN
  Administered 2023-08-05: 3000 [IU] via INTRAVENOUS

## 2023-08-05 MED ORDER — LIDOCAINE HCL 1 % IJ SOLN
INTRAMUSCULAR | Status: AC
  Filled 2023-08-05: qty 20

## 2023-08-05 MED ORDER — SODIUM CHLORIDE 0.9% FLUSH: 3.0000 mL | Freq: Two times a day (BID) | INTRAVENOUS | Status: DC

## 2023-08-05 SURGICAL SUPPLY — 13 items
CATH BALLN WEDGE 5F 110CM (CATHETERS) IMPLANT
CATH INFINITI 5 FR JL3.5 (CATHETERS) IMPLANT
CATH INFINITI AMBI 5FR JK (CATHETERS) IMPLANT
DEVICE RAD TR BAND REGULAR (VASCULAR PRODUCTS) IMPLANT
DRAPE BRACHIAL (DRAPES) IMPLANT
GLIDESHEATH SLEND SS 6F .021 (SHEATH) IMPLANT
GUIDEWIRE INQWIRE 1.5J.035X260 (WIRE) IMPLANT
INQWIRE 1.5J .035X260CM (WIRE) ×1
PACK CARDIAC CATH (CUSTOM PROCEDURE TRAY) ×1 IMPLANT
PROTECTION STATION PRESSURIZED (MISCELLANEOUS) ×1
SET ATX-X65L (MISCELLANEOUS) IMPLANT
SHEATH GLIDE SLENDER 4/5FR (SHEATH) IMPLANT
STATION PROTECTION PRESSURIZED (MISCELLANEOUS) IMPLANT

## 2023-08-05 NOTE — Interval H&P Note (Signed)
History and Physical Interval Note:  08/05/2023 10:52 AM  Norman Marks  has presented today for surgery, with the diagnosis of R and L Cath   Systolic HF.  The various methods of treatment have been discussed with the patient and family. After consideration of risks, benefits and other options for treatment, the patient has consented to  Procedure(s): RIGHT/LEFT HEART CATH AND CORONARY ANGIOGRAPHY (Bilateral) as a surgical intervention.  The patient's history has been reviewed, patient examined, no change in status, stable for surgery.  I have reviewed the patient's chart and labs.  Questions were answered to the patient's satisfaction.     Lorine Bears

## 2023-08-06 ENCOUNTER — Ambulatory Visit: Payer: Medicare Other | Attending: Cardiovascular Disease

## 2023-08-06 ENCOUNTER — Encounter: Payer: Self-pay | Admitting: Cardiovascular Disease

## 2023-08-06 ENCOUNTER — Telehealth: Payer: Self-pay | Admitting: *Deleted

## 2023-08-06 DIAGNOSIS — I493 Ventricular premature depolarization: Secondary | ICD-10-CM

## 2023-08-06 NOTE — Telephone Encounter (Signed)
Patient has been made aware about the monitor. Instructions have been provided. Appointment made for 08/20/23.

## 2023-08-06 NOTE — Telephone Encounter (Signed)
-----   Message from [place] sent at 08/05/2023 12:29 PM EST ----- Can you please arrange for him to have a 3-day ZIO monitor (for symptomatic PVCs) and follow-up with me or APP in 2 to 3 weeks?  Thanks

## 2023-08-10 DIAGNOSIS — I493 Ventricular premature depolarization: Secondary | ICD-10-CM | POA: Diagnosis not present

## 2023-08-20 ENCOUNTER — Ambulatory Visit: Payer: Medicare Other | Admitting: Cardiovascular Disease

## 2023-08-27 ENCOUNTER — Ambulatory Visit: Payer: Medicare Other | Attending: Physician Assistant | Admitting: Physician Assistant

## 2023-08-27 ENCOUNTER — Encounter: Payer: Self-pay | Admitting: Physician Assistant

## 2023-08-27 VITALS — BP 140/77 | HR 79 | Ht 69.0 in | Wt 144.8 lb

## 2023-08-27 DIAGNOSIS — I25118 Atherosclerotic heart disease of native coronary artery with other forms of angina pectoris: Secondary | ICD-10-CM

## 2023-08-27 DIAGNOSIS — I5022 Chronic systolic (congestive) heart failure: Secondary | ICD-10-CM | POA: Diagnosis not present

## 2023-08-27 DIAGNOSIS — Z79899 Other long term (current) drug therapy: Secondary | ICD-10-CM

## 2023-08-27 DIAGNOSIS — E785 Hyperlipidemia, unspecified: Secondary | ICD-10-CM

## 2023-08-27 DIAGNOSIS — I447 Left bundle-branch block, unspecified: Secondary | ICD-10-CM

## 2023-08-27 DIAGNOSIS — I10 Essential (primary) hypertension: Secondary | ICD-10-CM

## 2023-08-27 DIAGNOSIS — I493 Ventricular premature depolarization: Secondary | ICD-10-CM | POA: Diagnosis not present

## 2023-08-27 MED ORDER — METOPROLOL SUCCINATE ER 200 MG PO TB24: 200.0000 mg | ORAL_TABLET | Freq: Every day | ORAL | 3 refills | Status: DC

## 2023-08-27 NOTE — Progress Notes (Signed)
 Cardiology Office Note    Date:  08/27/2023   ID:  Norman Marks, DOB 11-12-1945, MRN [id number]  PCP:  Eustaquio Boyden, MD  Cardiologist:  Lorine Bears, MD  Electrophysiologist:  None   Chief Complaint: Follow-up  History of Present Illness:   Norman Marks is a 78 y.o. male with history of CAD, HFrEF secondary to NICM, frequent PVCs, LBBB, DM2, HTN, and HLD who presents for follow-up of R/LHC and Zio patch.   He underwent cardiac cath in 2009 which showed 1-vessel CAD involving the proximal ramus with 70% stenosis.  He was managed medically.  Follow-up nuclear stress testing in the 12/2013 showed fixed anteroseptal and inferoseptal defects with normal EF.  The defects were felt to be secondary to his left bundle branch block.  He underwent echo in 07/2015 that showed an EF of 50 to 55%, grade 1 diastolic dysfunction, mildly dilated left atrium, mild mitral regurgitation, and mildly elevated PASP at 42 mmHg.  He was seen in 06/2021 with increasing angina.  Echo at that time showed an EF of 50 to 55% with no significant valvular abnormalities.  LHC showed 70% mid LAD stenosis with diffuse 40% disease extending all the way to the ostium, 60% proximal ramus stenosis, and has no other obstructive disease.  LVEDP was mildly elevated.  Medical management was recommended with addition of Imdur, though he did not tolerate this medication secondary to headache and dizziness.  Echo in 06/2023 showed an EF of 45 to 50%, hypokinesis of the anterior and anteroseptal region that seemed to be more pronounced than left bundle branch block defect, grade 1 diastolic dysfunction, normal RV systolic function and ventricular cavity size, mild mitral regurgitation, and aortic valve sclerosis without evidence of stenosis.  In this setting, he underwent R/LHC on 08/05/2023 that showed stable two-vessel CAD including ostial proximal LAD 40% stenosis, mid LAD 70% stenosis, and ramus intermedius is 60% stenosis.  There  was no significant change in coronary anatomy since 2022.  LVEF 35 to 45%.  RHC showed normal right and left-sided filling pressures, normal pulmonary pressure, and normal cardiac output.  Frequent PVCs were noted during the study.  It was suspected his cardiomyopathy was related to PVC burden versus LBBB given significant septal and lateral dyssynchrony noted on echo.  Subsequent Zio patch showed a predominant rhythm of sinus with an average rate of 87 bpm.  Bundle branch block was noted.  Frequent PVCs overall burden of 13.9% along with rare ventricular couplets with a burden of less than 1%.  Ventricular bigeminy and trigeminy were present.  He comes in doing reasonably well from a cardiac perspective.  He does continue to note randomly occurring episodes of tightness in the chest that will last for 5 to 10 minutes and spontaneously resolved.  He feels like he cannot get a good breath during these episodes.  No dizziness, presyncope, or syncope.  No palpitations.  No falls or symptoms concerning for bleeding.  His wife, present with him today, feels like he "does not have color."  She also feels like he overdoes it at times, overexerting himself.   Labs independently reviewed: 07/2023 - potassium 3.9, BUN 12, serum creatinine 0.59, Hgb 12.5, PLT 247 03/2023 - TSH normal, A1c 6.7, TC 79, TG 69, HDL 32, LDL 33, albumin 4.0, AST/ALT normal   Past Medical History:  Diagnosis Date   Basal cell carcinoma 02/10/2020   SEE MEDIA 8 PAGES OF PREVIOUS BCC   Basal cell carcinoma 08/23/2021  left malar cheek   Basal cell carcinoma 11/14/2022   Right dorsal hand - EDC   Basal cell carcinoma 02/14/2023   left scapula lateral, EDC   Basal cell carcinoma 02/14/2023   left scapula medial, EDC   BCC (basal cell carcinoma of skin) 04/25/2023   right top of shoulder tx with ED&C   BCC (basal cell carcinoma) 03/22/2021   sup- left chst (CX35FU)   BPH (benign prostatic hypertrophy)    CAD (coronary artery  disease) 2009   [name] (cardiolite 2009 with 70% stenosis of proximal ramus, not amenable to PCI, rec treat medically) --> [name]   Cancer (HCC)    basal cell skin cancer   Cataract    removed bilaterally   DDD (degenerative disc disease), lumbar    s/p surgery   Diabetes type 2, uncontrolled 1990s   referred to [facility] 09/2015   Diverticulosis    Headache(784.0)    History of adenomatous polyp of colon 10/2012   History of nephrolithiasis 2012   L ureter, R renal pelvis   HLD (hyperlipidemia)    HTN (hypertension)    mild   Hx of migraines    Left bundle branch block    Osteoarthritis of CMC joint of thumb 2014   s/p injections (Sypher)   Osteoarthritis of right shoulder 2015   s/p injections (Sypher)   Osteopenia 06/2011   T score -3, completed 2 yrs forteo, T score improved to -2.2 (2015)   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   in situ (CX35FU)   SCC (squamous cell carcinoma) 08/23/2021   right posterior mandible-CIS   SCC (squamous cell carcinoma) 08/23/2021   right dorsal hand   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   SEE MEDIA 8 PAGES OF PREVIOUS SCC AND TREATMENTS   Vitamin D deficiency    Wears hearing aid in both ears     Past Surgical History:  Procedure Laterality Date   BACK SURGERY  1994, 2008   lumbar; x 3, spinal fusion   CARDIAC CATHETERIZATION  2009   70% stenosis ostium   CARDIOVASCULAR STRESS TEST  2011   fixed defect inferior/septal walls without reversibility, LVEF 43% post stress   CATARACT EXTRACTION W/PHACO Right 03/09/2019   Procedure: CATARACT EXTRACTION PHACO AND INTRAOCULAR LENS PLACEMENT (IOC) RIGHT DIABETES;  Surgeon: Nevada Crane, MD;  Location: [facility];  Service: Ophthalmology;  Laterality: Right;  Diabetic - insulin and oral meds   CATARACT EXTRACTION W/PHACO Left 04/06/2019   Procedure: CATARACT EXTRACTION PHACO AND INTRAOCULAR LENS PLACEMENT (IOC) LEFT DIABETIC 01:04.2     11.5%       8.58;  Surgeon: Nevada Crane, MD;  Location: [facility];  Service: Ophthalmology;  Laterality: Left;  Diabetic - insulin and oral meds   COLONOSCOPY  10/2012   tubular adenoma, mild diverticulosis, rpt 5 yrs Arlyce Dice)   COLONOSCOPY  12/2017   4 polyps - SSP, TA, diverticulosis (Pyrtle)   COLONOSCOPY  07/2021   TA, diverticulosis, rpt 3 yrs vs none Marina Goodell)   dexa  06/2011   osteoporosis T -2.6 spine, -1.9 hip   dexa  07/2013   osteopenia T -2.2 femur, -1.2 spine   FLEXIBLE SIGMOIDOSCOPY  2007   pt reported normal [facility])   FOOT SURGERY Left 08/2017   HAND SURGERY Left 10/2015   CMC arthritis repair Janee Morn)   KNEE SURGERY Right    LEFT HEART CATH AND CORONARY ANGIOGRAPHY N/A 06/26/2021   Procedure: LEFT  HEART CATH AND CORONARY ANGIOGRAPHY;  Surgeon: Iran Ouch, MD;  Location: [facility] IN[facility];  Service: Cardiovascular;  Laterality: N/A;   POLYPECTOMY     RIGHT/LEFT HEART CATH AND CORONARY ANGIOGRAPHY Bilateral 08/05/2023   Procedure: RIGHT/LEFT HEART CATH AND CORONARY ANGIOGRAPHY;  Surgeon: Iran Ouch, MD;  Location: [facility];  Service: Cardiovascular;  Laterality: Bilateral;   SHOULDER SURGERY Right    SKIN SURGERY  2006   skin cancer   TENDON REPAIR  08/2017   TOTAL SHOULDER ARTHROPLASTY Right 01/26/2014   Procedure: TOTAL SHOULDER ARTHROPLASTY;  Surgeon: Eulas Post, MD;  Location: [facility] OR;  Service: Orthopedics;  Laterality: Right;   US ECHOCARDIOGRAPHY  10/2008   nl LV fxn, EF 50%, mild MR, mild pulm HTN    Current Medications: Current Meds  Medication Sig   aspirin 81 MG tablet Take 81 mg by mouth daily.   Cholecalciferol (VITAMIN D3) 25 MCG (1000 UT) CAPS Take 1 capsule (1,000 Units total) by mouth daily.   finasteride (PROSCAR) 5 MG tablet Take 1 tablet (5 mg total) by mouth daily.   insulin glargine (LANTUS) 100 UNIT/ML injection Inject 0.26 mLs (26 Units total) into the skin daily.   Insulin Pen Needle (NOVOFINE PLUS  PEN NEEDLE) 32G X 4 MM MISC Use to administer Ozempic once a week.   Insulin Syringe-Needle U-100 (TRUEPLUS INSULIN SYRINGE) 31G X 5/16" 1 ML MISC USE TO INJECT INSULIN AS DIRECTED   metFORMIN (GLUCOPHAGE-XR) 500 MG 24 hr tablet Take 2 tablets (1,000 mg total) by mouth daily with breakfast.   metoprolol (TOPROL XL) 200 MG 24 hr tablet Take 1 tablet (200 mg total) by mouth daily.   ONETOUCH ULTRA test strip CHECK BLOOD SUGAR ONCE OR TWICE DAILY ASDIRECTED   polyethylene glycol (MIRALAX / GLYCOLAX) 17 g packet Take 17 g by mouth daily as needed for moderate constipation.   ramipril (ALTACE) 10 MG capsule Take 1 capsule (10 mg total) by mouth daily.   rosuvastatin (CRESTOR) 20 MG tablet Take 1 tablet (20 mg total) by mouth daily. Overdue follow up visit.  PLEASE CALL OFFICE TO SCHEDULE APPOINTMENT PRIOR TO NEXT REFILL   Semaglutide,0.25 or 0.5MG /DOS, (OZEMPIC, 0.25 OR 0.5 MG/DOSE,) 2 MG/3ML SOPN Inject 0.5 mg into the skin once a week.   [DISCONTINUED] carvedilol (COREG) 25 MG tablet TAKE ONE TABLET BY MOUTH TWO TIMES DAILY WITH A MEAL    Allergies:   Metformin and related and Percocet [oxycodone-acetaminophen]   Social History   Socioeconomic History   Marital status: Married    Spouse name: Not on file   Number of children: Not on file   Years of education: Not on file   Highest education level: Not on file  Occupational History   Not on file  Tobacco Use   Smoking status: Former    Current packs/day: 0.00    Average packs/day: 0.3 packs/day for 10.0 years (2.5 ttl pk-yrs)    Types: Cigarettes    Start date: 10/21/1982    Quit date: 10/20/1992    Years since quitting: 30.8   Smokeless tobacco: Former    Types: Chew    Quit date: 10/20/1992  Vaping Use   Vaping status: Never Used  Substance and Sexual Activity   Alcohol use: No    Alcohol/week: 0.0 standard drinks of alcohol   Drug use: No   Sexual activity: Not on file  Other Topics Concern   Not on file  Social History Narrative  Caffeine: 1 cup coffee/day   Lives with wife, 1 dog, 5 cats.   Occupation: retired   Edu: 12th grade   Activity: no regular exercise   Diet: good water, fruits/vegetables daily   Social Drivers of Corporate investment banker Strain: Low Risk  (03/04/2020)   Overall Financial Resource Strain (CARDIA)    Difficulty of Paying Living Expenses: Not hard at all  Food Insecurity: No Food Insecurity (03/04/2020)   Hunger Vital Sign    Worried About Running Out of Food in the Last Year: Never true    Ran Out of Food in the Last Year: Never true  Transportation Needs: Not on file  Physical Activity: Inactive (03/04/2020)   Exercise Vital Sign    Days of Exercise per Week: 0 days    Minutes of Exercise per Session: 0 min  Stress: No Stress Concern Present (03/04/2020)   [employer] of Occupational Health - Occupational Stress Questionnaire    Feeling of Stress : Not at all  Social Connections: Not on file     Family History:  The patient's family history includes Cancer in his father and mother; Coronary artery disease (age of onset: 23) in his sister; Diabetes in his brother and father; Heart attack (age of onset: 47) in his sister; Heart disease in his mother. There is no history of Stroke, Colon cancer, Colon polyps, Esophageal cancer, Rectal cancer, or Stomach cancer.  ROS:   12-point review of systems is negative unless otherwise noted in the HPI.   EKGs/Labs/Other Studies Reviewed:    Studies reviewed were summarized above. The additional studies were reviewed today:  2D echo 07/2015: - Left ventricle: The cavity size was normal. There was mild focal    basal and mild concentric hypertrophy of the septum. Systolic    function was normal. The estimated ejection fraction was in the    range of 50% to 55%. Anteroseptal wall motion hypokinesis noted,    unable to exclude dyskinesis, possibly secondary to bundle branch    block. Doppler parameters are consistent with abnormal  left    ventricular relaxation (grade 1 diastolic dysfunction).  - Mitral valve: There was mild regurgitation.  - Left atrium: The atrium was mildly dilated.  - Right ventricle: Systolic function was normal.  - Pulmonary arteries: Systolic pressure was mildly elevated. PA    peak pressure: 42 mm Hg (S).   Impressions:   - Murmur possibly secondary to proximal septal thickening. No    significant outflow tract gradient. __________   2D echo 06/20/2021: 1. Left ventricular ejection fraction, by estimation, is 50 to 55%. The  left ventricle has low normal function. Left ventricular endocardial  border not optimally defined to evaluate regional wall motion. Left  ventricular diastolic parameters are  consistent with Grade I diastolic dysfunction (impaired relaxation).  Elevated left atrial pressure.   2. Right ventricular systolic function is normal. The right ventricular  size is normal. Tricuspid regurgitation signal is inadequate for assessing  PA pressure.   3. The mitral valve is grossly normal. Trivial mitral valve  regurgitation.   4. The aortic valve is tricuspid. There is mild calcification of the  aortic valve. There is mild thickening of the aortic valve. Aortic valve  regurgitation is not visualized. Aortic valve sclerosis/calcification is  present, without any evidence of  aortic stenosis.  __________   LHC 06/26/2021:   Mid LAD lesion is 70% stenosed.   Ost LAD to Prox LAD lesion is 40% stenosed.  Ramus lesion is 60% stenosed.   The left ventricular systolic function is normal.   LV end diastolic pressure is mildly elevated.   The left ventricular ejection fraction is 50-55% by visual estimate.   1.  Borderline significant two-vessel coronary artery disease.  Stable proximal ramus stenosis was present on previous cardiac catheterization in 2009.  There is 70% mid LAD stenosis but the proximal LAD to the ostium is moderately diseased as well. 2.  Low normal LV  systolic function mildly elevated left ventricular end-diastolic pressure.   Recommendations: Recommend maximizing medical therapy before considering PCI for refractory symptoms.  PCI of the mid LAD can be considered but the proximal and ostial LAD has moderate diffuse disease and might require stenting all the way back to the ostium which is not ideal given the origin of a large ramus branch.  I added Imdur 30 mg daily. __________  2D echo 07/11/2023: 1. Left ventricular ejection fraction, by estimation, is 45 to 50%. The  left ventricle has mildly decreased function. The left ventricle  demonstrates regional wall motion abnormalities (hypokinesis of the  anterior and anteroseptal region). Left  ventricular diastolic parameters are consistent with Grade I diastolic  dysfunction (impaired relaxation). The average left ventricular global  longitudinal strain is -10.9 %.   2. Right ventricular systolic function is normal. The right ventricular  size is normal. Tricuspid regurgitation signal is inadequate for assessing  PA pressure.   3. The mitral valve is normal in structure. Mild mitral valve  regurgitation. No evidence of mitral stenosis.   4. The aortic valve is normal in structure. There is mild calcification  of the aortic valve. Aortic valve regurgitation is not visualized. Aortic  valve sclerosis is present, with no evidence of aortic valve stenosis.   5. The inferior vena cava is normal in size with greater than 50%  respiratory variability, suggesting right atrial pressure of 3 mmHg.  ___________  [facility] 08/05/2023:   Mid LAD lesion is 70% stenosed.   Ost LAD to Prox LAD lesion is 40% stenosed.   Ramus lesion is 60% stenosed.   There is mild left ventricular systolic dysfunction.   LV end diastolic pressure is moderately elevated.   The left ventricular ejection fraction is 35-45% by visual estimate.   1.  Stable two-vessel coronary artery disease.  No significant change in  coronary anatomy since 2022. 2.  Moderately reduced LV systolic function. 3.  Right heart catheterization showed normal right and left-sided filling pressures, normal pulmonary pressure and normal cardiac output. 4.  Frequent PVCs noted.   Recommendations: Continue medical therapy for coronary artery disease.  I doubt that coronary artery disease is responsible for cardiomyopathy. Suspect that the worsening cardiomyopathy might be PVC induced or left bundle branch block induced given significant septal and lateral dyssynchrony on echo. Will obtain a ZIO monitor to quantify PVC burden and optimize heart failure medications. __________  Luci Bank patch (preliminary) 07/2023: Patient had a min HR of 73 bpm, max HR of 106 bpm, and avg HR of 87 bpm. Predominant underlying rhythm was Sinus Rhythm. Bundle Branch Block/IVCD was present. Isolated SVEs were rare (<1.0%), and no SVE Couplets or SVE Triplets were present. Isolated VEs  were frequent (13.9%, [id number]), VE Couplets were rare (<1.0%, 2), and no VE Triplets were present. Ventricular Bigeminy and Trigeminy were present.    EKG:  EKG is ordered today.  The EKG ordered today demonstrates NSR, 79 bpm, LBBB  Recent Labs: 03/19/2023: ALT 14 03/26/2023: TSH  0.56 07/30/2023: BUN 12; Creatinine, Ser 0.59; Platelets 247 08/05/2023: Hemoglobin 11.9; Potassium 3.9; Sodium 143  Recent Lipid Panel    Component Value Date/Time   CHOL 79 03/19/2023 0807   CHOL 83 (L) 09/06/2021 0821   TRIG 69.0 03/19/2023 0807   HDL 32.10 (L) 03/19/2023 0807   HDL 33 (L) 09/06/2021 0821   CHOLHDL 2 03/19/2023 0807   VLDL 13.8 03/19/2023 0807   LDLCALC 33 03/19/2023 0807   LDLCALC 35 09/06/2021 0821   LDLDIRECT 65.0 01/23/2016 0931    PHYSICAL EXAM:    VS:  BP (!) 140/77 (BP Location: Left Arm, Patient Position: Sitting, Cuff Size: Normal)   Pulse 79   Ht 5\' 9"  (1.753 m)   Wt 144 lb 12.8 oz (65.7 kg)   SpO2 98%   BMI 21.38 kg/m   BMI: Body mass index is 21.38  kg/m.  Physical Exam Vitals reviewed.  Constitutional:      Appearance: He is well-developed.  HENT:     Head: Normocephalic and atraumatic.  Eyes:     General:        Right eye: No discharge.        Left eye: No discharge.  Cardiovascular:     Rate and Rhythm: Normal rate and regular rhythm. Occasional Extrasystoles are present.    Heart sounds: Normal heart sounds, S1 normal and S2 normal. Heart sounds not distant. No midsystolic click and no opening snap. No murmur heard.    No friction rub.  Pulmonary:     Effort: Pulmonary effort is normal. No respiratory distress.     Breath sounds: Normal breath sounds. No decreased breath sounds, wheezing, rhonchi or rales.  Chest:     Chest wall: No tenderness.  Musculoskeletal:     Cervical back: Normal range of motion.     Right lower leg: No edema.     Left lower leg: No edema.  Skin:    General: Skin is warm and dry.     Nails: There is no clubbing.  Neurological:     Mental Status: He is alert and oriented to person, place, and time.  Psychiatric:        Speech: Speech normal.        Behavior: Behavior normal.        Thought Content: Thought content normal.        Judgment: Judgment normal.     Wt Readings from Last 3 Encounters:  08/27/23 144 lb 12.8 oz (65.7 kg)  08/05/23 143 lb (64.9 kg)  07/30/23 151 lb 2 oz (68.5 kg)     ASSESSMENT & PLAN:   HFrEF secondary to NICM with LBBB: Appears euvolemic and well compensated.  Given frequent PVCs noted on outpatient cardiac monitoring, discontinue carvedilol.  Start Toprol-XL 200 mg daily.  Continue ramipril 10 mg daily.  Not requiring a standing loop diuretic.  Reassess PVC burden on metoprolol and follow-up.  CAD involving native coronary arteries with stable angina: Continues to note chest tightness, query if this is related to PVC burden.  Recent LHC showed stable CAD with medical therapy recommended.  Continue aspirin 81 mg, Toprol-XL in place of carvedilol, and  rosuvastatin 20 mg.  No indication for further ischemic testing at this time.  Frequent PVCs: Query if this is contributing to cardiomyopathy.  Transition from carvedilol 25 mg twice daily to Toprol-XL 200 mg daily.  Check BMP, TSH, CBC, and magnesium.  HTN: Blood pressure is reasonably controlled in the office today.  Continue  pharmacotherapy as outlined above.  HLD: LDL 33 in 03/2023 with normal AST/ALT at that time.  Remains on rosuvastatin 20 mg.      Disposition: F/u with Dr. Kirke Corin or an APP in 1 month.   Medication Adjustments/Labs and Tests Ordered: Current medicines are reviewed at length with the patient today.  Concerns regarding medicines are outlined above. Medication changes, Labs and Tests ordered today are summarized above and listed in the Patient Instructions accessible in Encounters.   Signed, Eula Listen, PA-C 08/27/2023 4:47 PM     [facility] [place] [address] [place], [place] [address] [phone]

## 2023-08-27 NOTE — Patient Instructions (Signed)
Medication Instructions:  Your physician recommends the following medication changes.  STOP TAKING: Coreg   START TAKING: Toprol - XL 200 daily   *If you need a refill on your cardiac medications before your next appointment, please call your pharmacy*   Lab Work: Your provider would like for you to have following labs drawn today CBC, TSH, Magnesium, and BMeT.   If you have labs (blood work) drawn today and your tests are completely normal, you will receive your results only by: MyChart Message (if you have MyChart) OR A paper copy in the mail If you have any lab test that is abnormal or we need to change your treatment, we will call you to review the results.   Follow-Up: At [facility], you and your health needs are our priority.  As part of our continuing mission to provide you with exceptional heart care, we have created designated Provider Care Teams.  These Care Teams include your primary Cardiologist (physician) and Advanced Practice Providers (APPs -  Physician Assistants and Nurse Practitioners) who all work together to provide you with the care you need, when you need it.  We recommend signing up for the patient portal called "MyChart".  Sign up information is provided on this After Visit Summary.  MyChart is used to connect with patients for Virtual Visits (Telemedicine).  Patients are able to view lab/test results, encounter notes, upcoming appointments, etc.  Non-urgent messages can be sent to your provider as well.   To learn more about what you can do with MyChart, go to [email or web address].    Your next appointment:   1 month(s)  Provider:   You may see Lorine Bears, MD or one of the following Advanced Practice Providers on your designated Care Team:   Eula Listen, [place]

## 2023-08-28 LAB — BASIC METABOLIC PANEL
BUN/Creatinine Ratio: 16 (ref 10–24)
BUN: 11 mg/dL (ref 8–27)
CO2: 24 mmol/L (ref 20–29)
Calcium: 9.7 mg/dL (ref 8.6–10.2)
Chloride: 106 mmol/L (ref 96–106)
Creatinine, Ser: 0.69 mg/dL — ABNORMAL LOW (ref 0.76–1.27)
Glucose: 99 mg/dL (ref 70–99)
Potassium: 4.4 mmol/L (ref 3.5–5.2)
Sodium: 143 mmol/L (ref 134–144)
eGFR: 95 mL/min/{1.73_m2} (ref >59)

## 2023-08-28 LAB — CBC
Hematocrit: 40.1 % (ref 37.5–51.0)
Hemoglobin: 13.3 g/dL (ref 13.0–17.7)
MCH: 31 pg (ref 26.6–33.0)
MCHC: 33.2 g/dL (ref 31.5–35.7)
MCV: 94 fL (ref 79–97)
Platelets: 255 10*3/uL (ref 150–450)
RBC: 4.29 x10E6/uL (ref 4.14–5.80)
RDW: 11.8 % (ref 11.6–15.4)
WBC: 7.3 10*3/uL (ref 3.4–10.8)

## 2023-08-28 LAB — TSH: TSH: 0.623 u[IU]/mL (ref 0.450–4.500)

## 2023-08-28 LAB — MAGNESIUM: Magnesium: 1.8 mg/dL (ref 1.6–2.3)

## 2023-09-05 ENCOUNTER — Ambulatory Visit: Payer: Medicare Other | Admitting: Dermatology

## 2023-09-05 DIAGNOSIS — Z1283 Encounter for screening for malignant neoplasm of skin: Secondary | ICD-10-CM

## 2023-09-05 DIAGNOSIS — Z85828 Personal history of other malignant neoplasm of skin: Secondary | ICD-10-CM

## 2023-09-05 DIAGNOSIS — D489 Neoplasm of uncertain behavior, unspecified: Secondary | ICD-10-CM

## 2023-09-05 DIAGNOSIS — L570 Actinic keratosis: Secondary | ICD-10-CM | POA: Diagnosis not present

## 2023-09-05 DIAGNOSIS — L578 Other skin changes due to chronic exposure to nonionizing radiation: Secondary | ICD-10-CM | POA: Diagnosis not present

## 2023-09-05 DIAGNOSIS — L910 Hypertrophic scar: Secondary | ICD-10-CM

## 2023-09-05 DIAGNOSIS — W908XXA Exposure to other nonionizing radiation, initial encounter: Secondary | ICD-10-CM

## 2023-09-05 DIAGNOSIS — Z79899 Other long term (current) drug therapy: Secondary | ICD-10-CM

## 2023-09-05 DIAGNOSIS — D492 Neoplasm of unspecified behavior of bone, soft tissue, and skin: Secondary | ICD-10-CM

## 2023-09-05 DIAGNOSIS — C44519 Basal cell carcinoma of skin of other part of trunk: Secondary | ICD-10-CM | POA: Diagnosis not present

## 2023-09-05 DIAGNOSIS — Z7189 Other specified counseling: Secondary | ICD-10-CM

## 2023-09-05 DIAGNOSIS — Z5111 Encounter for antineoplastic chemotherapy: Secondary | ICD-10-CM

## 2023-09-05 DIAGNOSIS — L820 Inflamed seborrheic keratosis: Secondary | ICD-10-CM

## 2023-09-05 NOTE — Progress Notes (Signed)
 Follow-Up Visit   Subjective  Norman Marks is a 78 y.o. male who presents for the following: 3 month ak and isk followup, hx of spots at face, ears, and left proximal mandible  Patient reports that bx proven bcc at top of right shoulder is still a little sore after treatment with   Patient also reports bx at right top of shoulder is still tender The patient has spots, moles and lesions to be evaluated, some may be new or changing and the patient may have concern these could be cancer.  The following portions of the chart were reviewed this encounter and updated as appropriate: medications, allergies, medical history  Review of Systems:  No other skin or systemic complaints except as noted in HPI or Assessment and Plan.  Objective  Well appearing patient in no apparent distress; mood and affect are within normal limits.  Upper body skin exam performed with skin cancer screening Face, ears, back, arms, hands   Relevant exam findings are noted in the Assessment and Plan.  left upper back paraspinal superior 1.2 cm pink patch   left upper back paraspinal inferior 2.2 cm pink patch   face and ears x 15 (15) Erythematous thin papules/macules with gritty scale.  b/l arms x 22 (22) Erythematous stuck-on, waxy papule or plaque  Assessment & Plan   HISTORY OF BASAL CELL CARCINOMA OF THE SKIN Right top of shoulder  - now with remaining hypertrophic scar  T[facility] with [facility]/25/2023 - No evidence of recurrence today - Recommend regular full body skin exams - Recommend daily broad spectrum sunscreen SPF 30+ to sun-exposed areas, reapply every 2 hours as needed.  - Call if any new or changing lesions are noted between office visits  NEOPLASM OF UNCERTAIN BEHAVIOR (2) left upper back paraspinal superior Epidermal / dermal shaving  Lesion diameter (cm):  1.2 Informed consent: discussed and consent obtained   Timeout: patient name, date of birth, surgical site, and procedure  verified   Procedure prep:  Patient was prepped and draped in usual sterile fashion Prep type:  Isopropyl alcohol Anesthesia: the lesion was anesthetized in a standard fashion   Anesthetic:  1% lidocaine w/ epinephrine 1-100,000 buffered w/ 8.4% NaHCO3 Instrument used: flexible razor blade   Hemostasis achieved with: pressure, aluminum chloride and electrodesiccation   Outcome: patient tolerated procedure well   Post-procedure details: sterile dressing applied and wound care instructions given   Dressing type: bandage and petrolatum    Destruction of lesion Complexity: extensive   Destruction method: electrodesiccation and curettage   Informed consent: discussed and consent obtained   Timeout:  patient name, date of birth, surgical site, and procedure verified Procedure prep:  Patient was prepped and draped in usual sterile fashion Prep type:  Isopropyl alcohol Anesthesia: the lesion was anesthetized in a standard fashion   Anesthetic:  1% lidocaine w/ epinephrine 1-100,000 buffered w/ 8.4% NaHCO3 Curettage performed in three different directions: Yes   Electrodesiccation performed over the curetted area: Yes   Lesion length (cm):  1.2 Lesion width (cm):  1.2 Margin per side (cm):  0.2 Final wound size (cm):  1.6 Hemostasis achieved with:  pressure, aluminum chloride and electrodesiccation Outcome: patient tolerated procedure well with no complications   Post-procedure details: sterile dressing applied and wound care instructions given   Dressing type: bandage and petrolatum   Specimen 1 - Surgical pathology Differential Diagnosis: R/o BCC  Check Margins: No  ED&C today  left upper back paraspinal inferior Epidermal /  dermal shaving  Lesion diameter (cm):  2.2 Informed consent: discussed and consent obtained   Timeout: patient name, date of birth, surgical site, and procedure verified   Procedure prep:  Patient was prepped and draped in usual sterile fashion Prep type:   Isopropyl alcohol Anesthesia: the lesion was anesthetized in a standard fashion   Anesthetic:  1% lidocaine w/ epinephrine 1-100,000 buffered w/ 8.4% NaHCO3 Instrument used: flexible razor blade   Hemostasis achieved with: pressure, aluminum chloride and electrodesiccation   Outcome: patient tolerated procedure well   Post-procedure details: sterile dressing applied and wound care instructions given   Dressing type: bandage and petrolatum    Destruction of lesion Complexity: extensive   Destruction method: electrodesiccation and curettage   Informed consent: discussed and consent obtained   Timeout:  patient name, date of birth, surgical site, and procedure verified Procedure prep:  Patient was prepped and draped in usual sterile fashion Prep type:  Isopropyl alcohol Anesthesia: the lesion was anesthetized in a standard fashion   Anesthetic:  1% lidocaine w/ epinephrine 1-100,000 buffered w/ 8.4% NaHCO3 Curettage performed in three different directions: Yes   Electrodesiccation performed over the curetted area: Yes   Lesion length (cm):  2.2 Lesion width (cm):  2.2 Margin per side (cm):  0.2 Final wound size (cm):  2.6 Hemostasis achieved with:  pressure, aluminum chloride and electrodesiccation Outcome: patient tolerated procedure well with no complications   Post-procedure details: sterile dressing applied and wound care instructions given   Dressing type: bandage and petrolatum   Specimen 2 - Surgical pathology Differential Diagnosis: R/o BCC  Check Margins: No  ED&C today  R/o BCC   ED&C today  ACTINIC KERATOSIS (15) face and ears x 15 (15) 1st of April start 68f/u cream twice daily for 7 days to left cheek  1st of May start 5 f/u cream twice daily for 7 days to right cheek  - Start 5-fluorouracil/calcipotriene cream twice a day for 7 days to affected areas including left and right cheek. Prescription sent to [facility]. Patient advised they  will receive an email to purchase the medication online and have it sent to their home. Patient provided with handout reviewing treatment course and side effects and advised to call or message [place] on MyChart with any concerns.  Reviewed course of treatment and expected reaction.  Patient advised to expect inflammation and crusting and advised that erosions are possible.  Patient advised to be diligent with sun protection during and after treatment. Counseled to keep medication out of reach of children and pets.  ACTINIC DAMAGE WITH PRECANCEROUS ACTINIC KERATOSES Counseling for Topical Chemotherapy Management: Patient exhibits: - Severe, confluent actinic changes with pre-cancerous actinic keratoses that is secondary to cumulative UV radiation exposure over time - Condition that is severe; chronic, not at goal. - diffuse scaly erythematous macules and papules with underlying dyspigmentation - Discussed Prescription "Field Treatment" topical Chemotherapy for Severe, Chronic Confluent Actinic Changes with Pre-Cancerous Actinic Keratoses Field treatment involves treatment of an entire area of skin that has confluent Actinic Changes (Sun/ Ultraviolet light damage) and PreCancerous Actinic Keratoses by method of PhotoDynamic Therapy (PDT) and/or prescription Topical Chemotherapy agents such as 5-fluorouracil, 5-fluorouracil/calcipotriene, and/or imiquimod.  The purpose is to decrease the number of clinically evident and subclinical PreCancerous lesions to prevent progression to development of skin cancer by chemically destroying early precancer changes that may or may not be visible.  It has been shown to reduce the risk of developing skin cancer in  the treated area. As a result of treatment, redness, scaling, crusting, and open sores may occur during treatment course. One or more than one of these methods may be used and may have to be used several times to control, suppress and eliminate the PreCancerous  changes. Discussed treatment course, expected reaction, and possible side effects. - Recommend daily broad spectrum sunscreen SPF 30+ to sun-exposed areas, reapply every 2 hours as needed.  - Staying in the shade or wearing long sleeves, sun glasses (UVA+UVB protection) and wide brim hats (4-inch brim around the entire circumference of the hat) are also recommended. - Call for new or changing lesions.  Actinic keratoses are precancerous spots that appear secondary to cumulative UV radiation exposure/sun exposure over time. They are chronic with expected duration over 1 year. A portion of actinic keratoses will progress to squamous cell carcinoma of the skin. It is not possible to reliably predict which spots will progress to skin cancer and so treatment is recommended to prevent development of skin cancer.  Recommend daily broad spectrum sunscreen SPF 30+ to sun-exposed areas, reapply every 2 hours as needed.  Recommend staying in the shade or wearing long sleeves, sun glasses (UVA+UVB protection) and wide brim hats (4-inch brim around the entire circumference of the hat). Call for new or changing lesions. Destruction of lesion - face and ears x 15 (15) Complexity: simple   Destruction method: cryotherapy   Informed consent: discussed and consent obtained   Timeout:  patient name, date of birth, surgical site, and procedure verified Lesion destroyed using liquid nitrogen: Yes   Region frozen until ice ball extended beyond lesion: Yes   Outcome: patient tolerated procedure well with no complications   Post-procedure details: wound care instructions given   INFLAMED SEBORRHEIC KERATOSIS (22) b/l arms x 22 (22) Symptomatic, irritating, patient would like treated.  Spot at left forearm treated today with Ln2, will recheck at next follow up, if not resolved will consider biopsy  Destruction of lesion - b/l arms x 22 (22) Complexity: simple   Destruction method: cryotherapy   Informed consent:  discussed and consent obtained   Timeout:  patient name, date of birth, surgical site, and procedure verified Lesion destroyed using liquid nitrogen: Yes   Region frozen until ice ball extended beyond lesion: Yes   Outcome: patient tolerated procedure well with no complications   Post-procedure details: wound care instructions given   SKIN CANCER SCREENING   ACTINIC SKIN DAMAGE   HISTORY OF BASAL CELL CARCINOMA   CHEMOTHERAPY MANAGEMENT, ENCOUNTER FOR   COUNSELING AND COORDINATION OF CARE   MEDICATION MANAGEMENT    Return in about 3 months (around 12/03/2023) for ak followup recheck previous bx .  IAsher Muir, CMA, am acting as scribe for Armida Sans, MD.   Documentation: I have reviewed the above documentation for accuracy and completeness, and I agree with the above.  Armida Sans, MD

## 2023-09-05 NOTE — Patient Instructions (Addendum)
 In April  Start cream to left cheek twice daily for 7 days   In May  Start cream to right cheek twice daily for 7 days   - Start 5-fluorouracil/calcipotriene cream twice a day for 7 days to affected areas including to left and right cheek . Prescription sent to [facility]. Patient advised they will receive an email to purchase the medication online and have it sent to their home. Patient provided with handout reviewing treatment course and side effects and advised to call or message [place] on MyChart with any concerns.  Reviewed course of treatment and expected reaction.  Patient advised to expect inflammation and crusting and advised that erosions are possible.  Patient advised to be diligent with sun protection during and after treatment. Counseled to keep medication out of reach of children and pets.   Instructions for [facility]  One or more of your medications was sent to the [facility]. You will receive an email from them and can purchase the medicine through that link. It will then be mailed to your home at the address you confirmed. If for any reason you do not receive an email from them, please check your spam folder. If you still do not find the email, please let us know. [facility] phone number is [phone].    5-Fluorouracil/Calcipotriene Patient Education   Actinic keratoses are the dry, red scaly spots on the skin caused by sun damage. A portion of these spots can turn into skin cancer with time, and treating them can help prevent development of skin cancer.   Treatment of these spots requires removal of the defective skin cells. There are various ways to remove actinic keratoses, including freezing with liquid nitrogen, treatment with creams, or treatment with a blue light procedure in the office.   5-fluorouracil cream is a topical cream used to treat actinic keratoses. It works by interfering  with the growth of abnormal fast-growing skin cells, such as actinic keratoses. These cells peel off and are replaced by healthy ones.   5-fluorouracil/calcipotriene is a combination of the 5-fluorouracil cream with a vitamin D analog cream called calcipotriene. The calcipotriene alone does not treat actinic keratoses. However, when it is combined with 5-fluorouracil, it helps the 5-fluorouracil treat the actinic keratoses much faster so that the same results can be achieved with a much shorter treatment time.  INSTRUCTIONS FOR 5-FLUOROURACIL/CALCIPOTRIENE CREAM:   5-fluorouracil/calcipotriene cream typically only needs to be used for 4-7 days. A thin layer should be applied twice a day to the treatment areas recommended by your physician.   If your physician prescribed you separate tubes of 5-fluourouracil and calcipotriene, apply a thin layer of 5-fluorouracil followed by a thin layer of calcipotriene.   Avoid contact with your eyes, nostrils, and mouth. Do not use 5-fluorouracil/calcipotriene cream on infected or open wounds.   You will develop redness, irritation and some crusting at areas where you have pre-cancer damage/actinic keratoses. IF YOU DEVELOP PAIN, BLEEDING, OR SIGNIFICANT CRUSTING, STOP THE TREATMENT EARLY - you have already gotten a good response and the actinic keratoses should clear up well.  Wash your hands after applying 5-fluorouracil 5% cream on your skin.   A moisturizer or sunscreen with a minimum SPF 30 should be applied each morning.   Once you have finished the treatment, you can apply a thin layer of Vaseline twice a day to irritated areas to soothe and calm the areas more quickly. If you experience  significant discomfort, contact your physician.  For some patients it is necessary to repeat the treatment for best results.  SIDE EFFECTS: When using 5-fluorouracil/calcipotriene cream, you may have mild irritation, such as redness, dryness, swelling, or a mild  burning sensation. This usually resolves within 2 weeks. The more actinic keratoses you have, the more redness and inflammation you can expect during treatment. Eye irritation has been reported rarely. If this occurs, please let us know.  If you have any trouble using this cream, please call the office. If you have any other questions about this information, please do not hesitate to ask me before you leave the office.      Electrodesiccation and Curettage ("Scrape and Burn") Wound Care Instructions  Leave the original bandage on for 24 hours if possible.  If the bandage becomes soaked or soiled before that time, it is OK to remove it and examine the wound.  A small amount of post-operative bleeding is normal.  If excessive bleeding occurs, remove the bandage, place gauze over the site and apply continuous pressure (no peeking) over the area for 30 minutes. If this does not work, please call our clinic as soon as possible or page your doctor if it is after hours.   Once a day, cleanse the wound with soap and water. It is fine to shower. If a thick crust develops you may use a Q-tip dipped into dilute hydrogen peroxide (mix 1:1 with water) to dissolve it.  Hydrogen peroxide can slow the healing process, so use it only as needed.    After washing, apply petroleum jelly (Vaseline) or an antibiotic ointment if your doctor prescribed one for you, followed by a bandage.    For best healing, the wound should be covered with a layer of ointment at all times. If you are not able to keep the area covered with a bandage to hold the ointment in place, this may mean re-applying the ointment several times a day.  Continue this wound care until the wound has healed and is no longer open. It may take several weeks for the wound to heal and close.  Itching and mild discomfort is normal during the healing process.  If you have any discomfort, you can take Tylenol (acetaminophen) or ibuprofen as directed on the  bottle. (Please do not take these if you have an allergy to them or cannot take them for another reason).  Some redness, tenderness and white or yellow material in the wound is normal healing.  If the area becomes very sore and red, or develops a thick yellow-green material (pus), it may be infected; please notify us.    Wound healing continues for up to one year following surgery. It is not unusual to experience pain in the scar from time to time during the interval.  If the pain becomes severe or the scar thickens, you should notify the office.    A slight amount of redness in a scar is expected for the first six months.  After six months, the redness will fade and the scar will soften and fade.  The color difference becomes less noticeable with time.  If there are any problems, return for a post-op surgery check at your earliest convenience.  To improve the appearance of the scar, you can use silicone scar gel, cream, or sheets (such as Mederma or Serica) every night for up to one year. These are available over the counter (without a prescription).  Please call our office at [phone]  for any questions or concerns.   Biopsy Wound Care Instructions  Leave the original bandage on for 24 hours if possible.  If the bandage becomes soaked or soiled before that time, it is OK to remove it and examine the wound.  A small amount of post-operative bleeding is normal.  If excessive bleeding occurs, remove the bandage, place gauze over the site and apply continuous pressure (no peeking) over the area for 30 minutes. If this does not work, please call our clinic as soon as possible or page your doctor if it is after hours.   Once a day, cleanse the wound with soap and water. It is fine to shower. If a thick crust develops you may use a Q-tip dipped into dilute hydrogen peroxide (mix 1:1 with water) to dissolve it.  Hydrogen peroxide can slow the healing process, so use it only as needed.    After  washing, apply petroleum jelly (Vaseline) or an antibiotic ointment if your doctor prescribed one for you, followed by a bandage.    For best healing, the wound should be covered with a layer of ointment at all times. If you are not able to keep the area covered with a bandage to hold the ointment in place, this may mean re-applying the ointment several times a day.  Continue this wound care until the wound has healed and is no longer open.   Itching and mild discomfort is normal during the healing process. However, if you develop pain or severe itching, please call our office.   If you have any discomfort, you can take Tylenol (acetaminophen) or ibuprofen as directed on the bottle. (Please do not take these if you have an allergy to them or cannot take them for another reason).  Some redness, tenderness and white or yellow material in the wound is normal healing.  If the area becomes very sore and red, or develops a thick yellow-green material (pus), it may be infected; please notify us.    If you have stitches, return to clinic as directed to have the stitches removed. You will continue wound care for 2-3 days after the stitches are removed.   Wound healing continues for up to one year following surgery. It is not unusual to experience pain in the scar from time to time during the interval.  If the pain becomes severe or the scar thickens, you should notify the office.    A slight amount of redness in a scar is expected for the first six months.  After six months, the redness will fade and the scar will soften and fade.  The color difference becomes less noticeable with time.  If there are any problems, return for a post-op surgery check at your earliest convenience.  To improve the appearance of the scar, you can use silicone scar gel, cream, or sheets (such as Mederma or Serica) every night for up to one year. These are available over the counter (without a prescription).  Please call our  office at [phone] for any questions or concerns.    Actinic keratoses are precancerous spots that appear secondary to cumulative UV radiation exposure/sun exposure over time. They are chronic with expected duration over 1 year. A portion of actinic keratoses will progress to squamous cell carcinoma of the skin. It is not possible to reliably predict which spots will progress to skin cancer and so treatment is recommended to prevent development of skin cancer.  Recommend daily broad spectrum sunscreen SPF 30+ to sun-exposed  areas, reapply every 2 hours as needed.  Recommend staying in the shade or wearing long sleeves, sun glasses (UVA+UVB protection) and wide brim hats (4-inch brim around the entire circumference of the hat). Call for new or changing lesions.    Cryotherapy Aftercare  Wash gently with soap and water everyday.   Apply Vaseline and Band-Aid daily until healed.     Seborrheic Keratosis  What causes seborrheic keratoses? Seborrheic keratoses are harmless, common skin growths that first appear during adult life.  As time goes by, more growths appear.  Some people may develop a large number of them.  Seborrheic keratoses appear on both covered and uncovered body parts.  They are not caused by sunlight.  The tendency to develop seborrheic keratoses can be inherited.  They vary in color from skin-colored to gray, brown, or even black.  They can be either smooth or have a rough, warty surface.   Seborrheic keratoses are superficial and look as if they were stuck on the skin.  Under the microscope this type of keratosis looks like layers upon layers of skin.  That is why at times the top layer may seem to fall off, but the rest of the growth remains and re-grows.    Treatment Seborrheic keratoses do not need to be treated, but can easily be removed in the office.  Seborrheic keratoses often cause symptoms when they rub on clothing or jewelry.  Lesions can be in the way of  shaving.  If they become inflamed, they can cause itching, soreness, or burning.  Removal of a seborrheic keratosis can be accomplished by freezing, burning, or surgery. If any spot bleeds, scabs, or grows rapidly, please return to have it checked, as these can be an indication of a skin cancer.       Due to recent changes in healthcare laws, you may see results of your pathology and/or laboratory studies on MyChart before the doctors have had a chance to review them. We understand that in some cases there may be results that are confusing or concerning to you. Please understand that not all results are received at the same time and often the doctors may need to interpret multiple results in order to provide you with the best plan of care or course of treatment. Therefore, we ask that you please give [place] 2 business days to thoroughly review all your results before contacting the office for clarification. Should we see a critical lab result, you will be contacted sooner.   If You Need Anything After Your Visit  If you have any questions or concerns for your doctor, please call our main line at [phone] and press option 4 to reach your doctor's medical assistant. If no one answers, please leave a voicemail as directed and we will return your call as soon as possible. Messages left after 4 pm will be answered the following business day.   You may also send [place] a message via MyChart. We typically respond to MyChart messages within 1-2 business days.  For prescription refills, please ask your pharmacy to contact our office. Our fax number is [phone].  If you have an urgent issue when the clinic is closed that cannot wait until the next business day, you can page your doctor at the number below.    Please note that while we do our best to be available for urgent issues outside of office hours, we are not available 24/7.   If you have an urgent issue and are  unable to reach [place], you may choose  to seek medical care at your doctor's office, retail clinic, urgent care center, or emergency room.  If you have a medical emergency, please immediately call 911 or go to the emergency department.  Pager Numbers  - Dr. [name]: [phone]  - Dr. Roseanne Reno: [phone]  - Dr. Katrinka Blazing: [phone]   In the event of inclement weather, please call our main line at [phone] for an update on the status of any delays or closures.  Dermatology Medication Tips: Please keep the boxes that topical medications come in in order to help keep track of the instructions about where and how to use these. Pharmacies typically print the medication instructions only on the boxes and not directly on the medication tubes.   If your medication is too expensive, please contact our office at [phone] option 4 or send [place] a message through MyChart.   We are unable to tell what your co-pay for medications will be in advance as this is different depending on your insurance coverage. However, we may be able to find a substitute medication at lower cost or fill out paperwork to get insurance to cover a needed medication.   If a prior authorization is required to get your medication covered by your insurance company, please allow [place] 1-2 business days to complete this process.  Drug prices often vary depending on where the prescription is filled and some pharmacies may offer cheaper prices.  The website www.goodrx.com contains coupons for medications through different pharmacies. The prices here do not account for what the cost may be with help from insurance (it may be cheaper with your insurance), but the website can give you the price if you did not use any insurance.  - You can print the associated coupon and take it with your prescription to the pharmacy.  - You may also stop by our office during regular business hours and pick up a GoodRx coupon card.  - If you need your prescription sent electronically to  a different pharmacy, notify our office through [facility] or by phone at [phone] option 4.     Si Usted Necesita Algo Despus de Su Visita  Tambin puede enviarnos un mensaje a travs de Clinical cytogeneticist. Por lo general respondemos a los mensajes de MyChart en el transcurso de 1 a 2 das hbiles.  Para renovar recetas, por favor pida a su farmacia que se ponga en contacto con nuestra oficina. Annie Sable de fax es [place] [phone].  Si tiene un asunto urgente cuando la clnica est cerrada y que no puede esperar hasta el siguiente da hbil, puede llamar/localizar a su doctor(a) al nmero que aparece a continuacin.   Por favor, tenga en cuenta que aunque hacemos todo lo posible para estar disponibles para asuntos urgentes fuera del horario de [place], no estamos disponibles las 24 horas del da, los 7 [address]  [address] de la [place].   Si tiene un problema urgente y no puede comunicarse con nosotros, puede optar por buscar atencin mdica  en el consultorio de su doctor(a), en una clnica privada, en un centro de atencin urgente o en una sala de emergencias.  Si tiene Engineer, drilling, por favor llame inmediatamente al 911 o vaya a la sala de emergencias.  Nmeros de bper  - Dr. [name]: [phone]  - Dra. Roseanne Reno: [phone]  - Dr. Katrinka Blazing: [phone]   En caso de inclemencias del tiempo, por favor llame a Lacy Duverney principal al [phone] para Neomia Dear actualizacin sobre  el estado de cualquier retraso o cierre.  Consejos para la medicacin en dermatologa: Por favor, guarde las cajas en las que vienen los medicamentos de uso tpico para ayudarle a seguir las instrucciones sobre dnde y cmo usarlos. Las farmacias generalmente imprimen las instrucciones del medicamento slo en las cajas y no directamente en los tubos del [place].   Si su medicamento es muy caro, por favor, pngase en contacto con Rolm Gala llamando al [phone] y presione la opcin 4 o envenos  un mensaje a travs de Clinical cytogeneticist.   No podemos decirle cul ser su copago por los medicamentos por adelantado ya que esto es diferente dependiendo de la cobertura de su seguro. Sin embargo, es posible que podamos encontrar un medicamento sustituto a Audiological scientist un formulario para que el seguro cubra el medicamento que se considera necesario.   Si se requiere una autorizacin previa para que su compaa de seguros [place] su medicamento, por favor permtanos de 1 a 2 das hbiles para completar [address]  Los precios de los medicamentos varan con frecuencia dependiendo del Environmental consultant de dnde se surte la receta y alguna farmacias pueden ofrecer precios ms baratos.  El sitio web www.goodrx.com tiene cupones para medicamentos de Health and safety inspector. Los precios aqu no tienen en cuenta lo que podra costar con la ayuda del seguro (puede ser ms barato con su seguro), pero el sitio web puede darle el precio si no utiliz Tourist information centre manager.  - Puede imprimir el cupn correspondiente y llevarlo con su receta a la farmacia.  - Tambin puede pasar por nuestra oficina durante el horario de atencin regular y Education officer, museum una tarjeta de cupones de GoodRx.  - Si necesita que su receta se enve electrnicamente a una farmacia diferente, informe a nuestra oficina a travs de MyChart de [place] o por telfono llamando al [phone] y presione la opcin 4.

## 2023-09-09 ENCOUNTER — Telehealth: Payer: Self-pay

## 2023-09-09 ENCOUNTER — Encounter: Payer: Self-pay | Admitting: Dermatology

## 2023-09-09 LAB — SURGICAL PATHOLOGY

## 2023-09-09 NOTE — Telephone Encounter (Signed)
 Discussed pathology results. BCCs already treated with EDC. Patient voiced understanding. Will recheck at follow up appointment.   Patient states he would prefer to use 5FU cream he already has to treat lesions on his cheeks. He has Rf at his pharmacy. Advised to use twice a day 2-3 weeks as tolerated. If irritated at 2 weeks stop using and let areas heal.

## 2023-09-09 NOTE — Telephone Encounter (Signed)
-----   Message from Armida Sans sent at 09/09/2023  5:20 PM EST ----- FINAL DIAGNOSIS        1. Skin, left upper back paraspinal superior :       BASAL CELL CARCINOMA, SUPERFICIAL AND NODULAR PATTERNS        2. Skin, left upper back paraspinal inferior :       BASAL CELL CARCINOMA, SUPERFICIAL AND NODULAR PATTERNS   1&2 - both Cancer = BCC Both already treated with [facility] Keep May follow up appt

## 2023-09-18 ENCOUNTER — Encounter: Payer: Self-pay | Admitting: Family Medicine

## 2023-09-23 ENCOUNTER — Ambulatory Visit (INDEPENDENT_AMBULATORY_CARE_PROVIDER_SITE_OTHER): Payer: Medicare Other | Admitting: Family Medicine

## 2023-09-23 ENCOUNTER — Encounter: Payer: Self-pay | Admitting: Family Medicine

## 2023-09-23 VITALS — BP 158/76 | HR 79 | Temp 97.8°F | Resp 94 | Ht 69.0 in | Wt 146.2 lb

## 2023-09-23 DIAGNOSIS — I10 Essential (primary) hypertension: Secondary | ICD-10-CM | POA: Diagnosis not present

## 2023-09-23 DIAGNOSIS — Z794 Long term (current) use of insulin: Secondary | ICD-10-CM

## 2023-09-23 DIAGNOSIS — I5022 Chronic systolic (congestive) heart failure: Secondary | ICD-10-CM

## 2023-09-23 DIAGNOSIS — E1169 Type 2 diabetes mellitus with other specified complication: Secondary | ICD-10-CM

## 2023-09-23 LAB — POCT GLYCOSYLATED HEMOGLOBIN (HGB A1C): Hemoglobin A1C: 7.2 % — AB (ref 4.0–5.6)

## 2023-09-23 MED ORDER — METFORMIN HCL ER 500 MG PO TB24: 1000.0000 mg | ORAL_TABLET | Freq: Every day | ORAL | 3 refills | Status: DC

## 2023-09-23 MED ORDER — METFORMIN HCL ER 500 MG PO TB24: 500.0000 mg | ORAL_TABLET | Freq: Every day | ORAL | 2 refills | Status: DC

## 2023-09-23 MED ORDER — INSULIN GLARGINE 100 UNIT/ML ~~LOC~~ SOLN: 25.0000 [IU] | Freq: Every day | SUBCUTANEOUS | 2 refills | Status: DC

## 2023-09-23 NOTE — Assessment & Plan Note (Signed)
 S/p recent R/L heart catheterization, Zio patch cardiac event monitor  Thought due to frequent PVCs and LBBB.

## 2023-09-23 NOTE — Patient Instructions (Addendum)
 BP was too high today - start monitoring daily at home with BP log sheet provided and bring readings to cardiology next week.  For sugars - continue metformin XR 2 tablets in the morning for total 1000mg . Drop lantus dose to 25u nightly. If ongoing low sugars, continue dropping lantus to 24u nightly.  Good to see you today  Return in 3 months for diabetes follow up visit

## 2023-09-23 NOTE — Progress Notes (Signed)
 Ph: [phone] Fax: [phone]   Patient ID: Norman Marks, male    DOB: 1945/10/20, 78 y.o.   MRN: [id number]  This visit was conducted in person.  BP (!) 158/76 (BP Location: Right Arm, Cuff Size: Normal)   Pulse 79   Temp 97.8 F (36.6 C) (Oral)   Resp (!) 94   Ht 5\' 9"  (1.753 m)   Wt 146 lb 4 oz (66.3 kg)   BMI 21.60 kg/m   BP Readings from Last 3 Encounters:  09/23/23 (!) 158/76  08/27/23 (!) 140/77  08/05/23 (!) 149/83   CC: 6 mo DM f/u visit  Subjective:   HPI: Norman Marks is a 78 y.o. male presenting on 09/23/2023 for Medical Management of Chronic Issues (Here for 6 mo DM f/u. Mentioned recently started metoprolol. Has cards f/u- 10/01/23.)   He stopped finasteride after 2 months - never really helped urge incontinence and may have made symptoms worse.   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 Most recently carvedilol was changed to Toprol XL 200mg  daily due to heart monitor showing very frequent PVCs - pt doesn't really feel these.  Recent right and left heart catheterization ([name]) 07/2023: 1.  Stable two-vessel coronary artery disease.  No significant change in coronary anatomy since 2022. 2.  Moderately reduced LV systolic function. 3.  Right heart catheterization showed normal right and left-sided filling pressures, normal pulmonary pressure and normal cardiac output. 4.  Frequent PVCs noted.  Recommendations: Continue medical therapy for coronary artery disease.  I doubt that coronary artery disease is responsible for cardiomyopathy. Suspect that the worsening cardiomyopathy might be PVC induced or left bundle branch block induced given significant septal and lateral dyssynchrony on echo.   HTN - Compliant with current antihypertensive regimen of Toprol XL (metoprolol succinate) 200mg  daily and ramipril 10mg  daily. Does not check blood pressures at home. No low blood pressure readings or symptoms of dizziness/syncope. Denies HA, vision  changes, CP/tightness, SOB. Notes ongoing pedal edema. Some dyspnea and chest pains.   DM - does regularly check sugars with CGM. Compliant with antihyperglycemic regimen which includes: lantus 26u daily, metformin XR 1000mg  daily, ozempic 0.5mg  weekly. Some low sugars to 59 without hypoglycemic symptoms - so he dropped metformin to one tablet daily with improvement. Denies paresthesias, blurry vision. Last diabetic eye exam 07/2023. Glucometer brand: one touch ultra. Last foot exam: 09/2022 - DUE. DSME: completed 2017. CGM data: Freestyle Libre 3  30 day average sugar: 177, time in range 53%, hypoglycemia 1%, hyperglycemia 46%.  Lab Results  Component Value Date   HGBA1C 7.2 (A) 09/23/2023   Diabetic Foot Exam - Simple   Simple Foot Form Diabetic Foot exam was performed with the following findings: Yes 09/23/2023 10:20 AM  Visual Inspection No deformities, no ulcerations, no other skin breakdown bilaterally: Yes Sensation Testing Intact to touch and monofilament testing bilaterally: Yes Pulse Check Posterior Tibialis and Dorsalis pulse intact bilaterally: Yes Comments No claudication     Lab Results  Component Value Date   MICROALBUR 1.4 03/19/2023         Relevant past medical, surgical, family and social history reviewed and updated as indicated. Interim medical history since our last visit reviewed. Allergies and medications reviewed and updated. Outpatient Medications Prior to Visit  Medication Sig Dispense Refill   aspirin 81 MG tablet Take 81 mg by mouth daily.     Cholecalciferol (VITAMIN D3) 25 MCG (1000 UT) CAPS Take 1 capsule (1,000  Units total) by mouth daily. 30 capsule    Insulin Pen Needle (NOVOFINE PLUS PEN NEEDLE) 32G X 4 MM MISC Use to administer Ozempic once a week. 30 each 0   Insulin Syringe-Needle U-100 (TRUEPLUS INSULIN SYRINGE) 31G X 5/16" 1 ML MISC USE TO INJECT INSULIN AS DIRECTED 100 each 4   metoprolol (TOPROL XL) 200 MG 24 hr tablet Take 1 tablet (200 mg  total) by mouth daily. 90 tablet 3   ONETOUCH ULTRA test strip CHECK BLOOD SUGAR ONCE OR TWICE DAILY ASDIRECTED 100 each 3   polyethylene glycol (MIRALAX / GLYCOLAX) 17 g packet Take 17 g by mouth daily as needed for moderate constipation.     ramipril (ALTACE) 10 MG capsule Take 1 capsule (10 mg total) by mouth daily. 90 capsule 4   rosuvastatin (CRESTOR) 20 MG tablet Take 1 tablet (20 mg total) by mouth daily. Overdue follow up visit.  PLEASE CALL OFFICE TO SCHEDULE APPOINTMENT PRIOR TO NEXT REFILL 90 tablet 0   Semaglutide,0.25 or 0.5MG /DOS, (OZEMPIC, 0.25 OR 0.5 MG/DOSE,) 2 MG/3ML SOPN Inject 0.5 mg into the skin once a week. 3 mL 11   insulin glargine (LANTUS) 100 UNIT/ML injection Inject 0.26 mLs (26 Units total) into the skin daily. 30 mL 2   metFORMIN (GLUCOPHAGE-XR) 500 MG 24 hr tablet Take 2 tablets (1,000 mg total) by mouth daily with breakfast. (Patient taking differently: Take 1,000 mg by mouth daily with breakfast. Takes 1 tablet daily) 180 tablet 4   finasteride (PROSCAR) 5 MG tablet Take 1 tablet (5 mg total) by mouth daily. 30 tablet 7   No facility-administered medications prior to visit.     Per HPI unless specifically indicated in ROS section below Review of Systems  Objective:  BP (!) 158/76 (BP Location: Right Arm, Cuff Size: Normal)   Pulse 79   Temp 97.8 F (36.6 C) (Oral)   Resp (!) 94   Ht 5\' 9"  (1.753 m)   Wt 146 lb 4 oz (66.3 kg)   BMI 21.60 kg/m   Wt Readings from Last 3 Encounters:  09/23/23 146 lb 4 oz (66.3 kg)  08/27/23 144 lb 12.8 oz (65.7 kg)  08/05/23 143 lb (64.9 kg)      Physical Exam Vitals and nursing note reviewed.  Constitutional:      Appearance: Normal appearance. He is not ill-appearing.  Eyes:     Extraocular Movements: Extraocular movements intact.     Conjunctiva/sclera: Conjunctivae normal.     Pupils: Pupils are equal, round, and reactive to light.  Cardiovascular:     Rate and Rhythm: Normal rate and regular rhythm.      Pulses: Normal pulses.     Heart sounds: Normal heart sounds. No murmur heard. Pulmonary:     Effort: Pulmonary effort is normal. No respiratory distress.     Breath sounds: Normal breath sounds. No wheezing, rhonchi or rales.  Musculoskeletal:     Right lower leg: No edema.     Left lower leg: No edema.     Comments: See HPI for foot exam if done  Skin:    General: Skin is warm and dry.     Findings: No rash.  Neurological:     Mental Status: He is alert.  Psychiatric:        Mood and Affect: Mood normal.        Behavior: Behavior normal.       Results for orders placed or performed in visit on 09/23/23  POCT  glycosylated hemoglobin (Hb A1C)   Collection Time: 09/23/23 10:25 AM  Result Value Ref Range   Hemoglobin A1C 7.2 (A) 4.0 - 5.6 %   HbA1c POC (<> result, manual entry)     HbA1c, POC (prediabetic range)     HbA1c, POC (controlled diabetic range)     Lab Results  Component Value Date   NA 143 08/27/2023   CL 106 08/27/2023   K 4.4 08/27/2023   CO2 24 08/27/2023   BUN 11 08/27/2023   CREATININE 0.69 (L) 08/27/2023   EGFR 95 08/27/2023   CALCIUM 9.7 08/27/2023   ALBUMIN 4.0 03/19/2023   GLUCOSE 99 08/27/2023    Assessment & Plan:   Problem List Items Addressed This Visit     Type 2 diabetes mellitus with other specified complication (HCC) - Primary   Chronic, mildly worse. He notes low sugars early am - he dropped metformin XR to  1 tab daily. Recommend he return to 1000mg  metformin XR daily and instead drop lantus to 25u nightly with option to further taper insulin dose. Continue ozempic 0.5mg  weekly - no changes as he doesn't need to lose any more weight.  He agrees with plan.       Relevant Medications   insulin glargine (LANTUS) 100 UNIT/ML injection   metFORMIN (GLUCOPHAGE-XR) 500 MG 24 hr tablet   Other Relevant Orders   POCT glycosylated hemoglobin (Hb A1C) (Completed)   HTN (hypertension)   Chronic, BP above goal with recent change in BB through  cardiology. BP log sheet provided today for him to monitor at home, and bring readings to cardiology appt next week for med titration if needed.  Reviewed limiting salt/sodium in diet.       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         Meds ordered this encounter  Medications   DISCONTD: metFORMIN (GLUCOPHAGE-XR) 500 MG 24 hr tablet    Sig: Take 1 tablet (500 mg total) by mouth daily with breakfast.    Dispense:  90 tablet    Refill:  2    Note new sig   insulin glargine (LANTUS) 100 UNIT/ML injection    Sig: Inject 0.25 mLs (25 Units total) into the skin daily.    Dispense:  30 mL    Refill:  2    Note new dose   metFORMIN (GLUCOPHAGE-XR) 500 MG 24 hr tablet    Sig: Take 2 tablets (1,000 mg total) by mouth daily with breakfast.    Dispense:  180 tablet    Refill:  3    Use this dose    Orders Placed This Encounter  Procedures   POCT glycosylated hemoglobin (Hb A1C)    Patient Instructions  BP was too high today - start monitoring daily at home with BP log sheet provided and bring readings to cardiology next week.  For sugars - continue metformin XR 2 tablets in the morning for total 1000mg . Drop lantus dose to 25u nightly. If ongoing low sugars, continue dropping lantus to 24u nightly.  Good to see you today  Return in 3 months for diabetes follow up visit   Follow up plan: Return in about 3 months (around 12/24/2023) for follow up visit.  Eustaquio Boyden, MD

## 2023-09-23 NOTE — Assessment & Plan Note (Signed)
 Chronic, mildly worse. He notes low sugars early am - he dropped metformin XR to  1 tab daily. Recommend he return to 1000mg  metformin XR daily and instead drop lantus to 25u nightly with option to further taper insulin dose. Continue ozempic 0.5mg  weekly - no changes as he doesn't need to lose any more weight.  He agrees with plan.

## 2023-09-23 NOTE — Assessment & Plan Note (Signed)
 Chronic, BP above goal with recent change in BB through cardiology. BP log sheet provided today for him to monitor at home, and bring readings to cardiology appt next week for med titration if needed.  Reviewed limiting salt/sodium in diet.

## 2023-09-29 NOTE — Progress Notes (Unsigned)
 Cardiology Office Note    Date:  10/01/2023   ID:  Norman Marks, DOB Feb 12, 1946, MRN [id number]  PCP:  Eustaquio Boyden, MD  Cardiologist:  Lorine Bears, MD  Electrophysiologist:  None   Chief Complaint: Follow-up  History of Present Illness:   Norman Marks is a 78 y.o. male with history of CAD, HFrEF secondary to NICM, frequent PVCs, LBBB, DM2, HTN, and HLD who presents for follow-up of CAD and frequent PVCs.  He underwent cardiac cath in 2009 which showed 1-vessel CAD involving the proximal ramus with 70% stenosis.  He was managed medically.  Follow-up nuclear stress testing in the 12/2013 showed fixed anteroseptal and inferoseptal defects with normal EF.  The defects were felt to be secondary to his left bundle branch block.  He underwent echo in 07/2015 that showed an EF of 50 to 55%, grade 1 diastolic dysfunction, mildly dilated left atrium, mild mitral regurgitation, and mildly elevated PASP at 42 mmHg.  He was seen in 06/2021 with increasing angina.  Echo at that time showed an EF of 50 to 55% with no significant valvular abnormalities.  LHC showed 70% mid LAD stenosis with diffuse 40% disease extending all the way to the ostium, 60% proximal ramus stenosis, and no other obstructive disease.  LVEDP was mildly elevated.  Medical management was recommended with addition of Imdur, though he did not tolerate this medication secondary to headache and dizziness.  Echo in 06/2023 showed an EF of 45 to 50%, hypokinesis of the anterior and anteroseptal region that seemed to be more pronounced than left bundle branch block defect, grade 1 diastolic dysfunction, normal RV systolic function and ventricular cavity size, mild mitral regurgitation, and aortic valve sclerosis without evidence of stenosis.  In this setting, he underwent R/LHC on 08/05/2023 that showed stable two-vessel CAD including ostial/proximal LAD 40% stenosis, mid LAD 70% stenosis, and ramus intermedius is 60% stenosis.  There was  no significant change in coronary anatomy since 2022.  LVEF 35 to 45%.  RHC showed normal right and left-sided filling pressures, normal pulmonary pressure, and normal cardiac output.  Frequent PVCs were noted during the study.  It was suspected his cardiomyopathy was related to PVC burden versus LBBB given significant septal and lateral dyssynchrony noted on echo.  Subsequent Zio patch showed a predominant rhythm of sinus with an average rate of 87 bpm.  Bundle branch block was noted.  Frequent PVCs overall burden of 13.9% along with rare ventricular couplets with a burden of less than 1%.  Ventricular bigeminy and trigeminy were present.  He was last seen in the office in 08/2023 and continued to note episodes of chest tightness that would last for 5 to 10 minutes and spontaneously resolved.  He was transitioned from carvedilol to Toprol-XL 200 mg daily in an effort to suppress PVC burden.  Labs were stable as outlined below.  He comes in today feeling about the same as he did at his visit in 08/2023 continuing to note ongoing fatigue and a sensation that he cannot get a good breath in.  He is without symptoms of angina or cardiac decompensation.  No palpitations, dizziness, presyncope, or syncope.  No lower extremity swelling or progressive orthopnea.  His wife continues to feel like the patient continues to overexert himself at times.  Weight stable by our scale.  No falls or symptoms concerning for bleeding.  He does intermittently snore with wife denying any apneic episodes.   Labs independently reviewed: 09/2023 - A1c  7.2 08/2023 - magnesium 1.8, TSH normal, Hgb 13.3, PLT 255, BUN 11, serum creatinine 0.69, potassium 4.4 03/2023 - TC 79, TG 69, HDL 32, LDL 33, albumin 4.0, AST/ALT normal   Past Medical History:  Diagnosis Date   Basal cell carcinoma 02/10/2020   SEE MEDIA 8 PAGES OF PREVIOUS BCC   Basal cell carcinoma 08/23/2021   left malar cheek   Basal cell carcinoma 11/14/2022   Right dorsal  hand - EDC   Basal cell carcinoma 02/14/2023   left scapula lateral, EDC   Basal cell carcinoma 02/14/2023   left scapula medial, EDC   Basal cell carcinoma 09/05/2023   Left upper back paraspinal superior. Superficial and nodular. EDC   Basal cell carcinoma 09/05/2023   Left upper back paraspinal, inferior. Superficial, nodular. EDC   BCC (basal cell carcinoma of skin) 04/25/2023   right top of shoulder tx with ED&C   BCC (basal cell carcinoma) 03/22/2021   sup- left chst (CX35FU)   BPH (benign prostatic hypertrophy)    CAD (coronary artery disease) 2009   [name] (cardiolite 2009 with 70% stenosis of proximal ramus, not amenable to PCI, rec treat medically) --> [name]   Cancer (HCC)    basal cell skin cancer   Cataract    removed bilaterally   DDD (degenerative disc disease), lumbar    s/p surgery   Diabetes type 2, uncontrolled 1990s   referred to [facility] 09/2015   Diverticulosis    Headache(784.0)    History of adenomatous polyp of colon 10/2012   History of nephrolithiasis 2012   L ureter, R renal pelvis   HLD (hyperlipidemia)    HTN (hypertension)    mild   Hx of migraines    Left bundle branch block    Osteoarthritis of CMC joint of thumb 2014   s/p injections (Sypher)   Osteoarthritis of right shoulder 2015   s/p injections (Sypher)   Osteopenia 06/2011   T score -3, completed 2 yrs forteo, T score improved to -2.2 (2015)   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   in situ (CX35FU)   SCC (squamous cell carcinoma) 08/23/2021   right posterior mandible-CIS   SCC (squamous cell carcinoma) 08/23/2021   right dorsal hand   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   SEE MEDIA 8 PAGES OF PREVIOUS SCC AND TREATMENTS   Vitamin D deficiency    Wears hearing aid in both ears     Past Surgical History:  Procedure Laterality Date   BACK SURGERY  1994, 2008   lumbar; x 3, spinal fusion   CARDIAC CATHETERIZATION  2009   70% stenosis ostium    CARDIOVASCULAR STRESS TEST  2011   fixed defect inferior/septal walls without reversibility, LVEF 43% post stress   CATARACT EXTRACTION W/PHACO Right 03/09/2019   Procedure: CATARACT EXTRACTION PHACO AND INTRAOCULAR LENS PLACEMENT (IOC) RIGHT DIABETES;  Surgeon: Nevada Crane, MD;  Location: [facility];  Service: Ophthalmology;  Laterality: Right;  Diabetic - insulin and oral meds   CATARACT EXTRACTION W/PHACO Left 04/06/2019   Procedure: CATARACT EXTRACTION PHACO AND INTRAOCULAR LENS PLACEMENT (IOC) LEFT DIABETIC 01:04.2     11.5%      8.58;  Surgeon: Nevada Crane, MD;  Location: [facility];  Service: Ophthalmology;  Laterality: Left;  Diabetic - insulin and oral meds   COLONOSCOPY  10/2012   tubular adenoma, mild diverticulosis, rpt 5 yrs Arlyce Dice)   COLONOSCOPY  12/2017   4 polyps -  SSP, TA, diverticulosis (Pyrtle)   COLONOSCOPY  07/2021   TA, diverticulosis, rpt 3 yrs vs none Marina Goodell)   dexa  06/2011   osteoporosis T -2.6 spine, -1.9 hip   dexa  07/2013   osteopenia T -2.2 femur, -1.2 spine   FLEXIBLE SIGMOIDOSCOPY  2007   pt reported normal [facility])   FOOT SURGERY Left 08/2017   HAND SURGERY Left 10/2015   [facility] arthritis repair Janee Morn)   KNEE SURGERY Right    LEFT HEART CATH AND CORONARY ANGIOGRAPHY N/A 06/26/2021   Procedure: LEFT HEART CATH AND CORONARY ANGIOGRAPHY;  Surgeon: Iran Ouch, MD;  Location: [facility] IN[facility];  Service: Cardiovascular;  Laterality: N/A;   POLYPECTOMY     RIGHT/LEFT HEART CATH AND CORONARY ANGIOGRAPHY Bilateral 08/05/2023   Procedure: RIGHT/LEFT HEART CATH AND CORONARY ANGIOGRAPHY;  Surgeon: Iran Ouch, MD;  Location: [facility] INVAS[facility] CV LAB;  Service: Cardiovascular;  Laterality: Bilateral;   SHOULDER SURGERY Right    SKIN SURGERY  2006   skin cancer   TENDON REPAIR  08/2017   TOTAL SHOULDER ARTHROPLASTY Right 01/26/2014   Procedure: TOTAL SHOULDER ARTHROPLASTY;  Surgeon: Eulas Post, MD;  Location: [facility]  OR;  Service: Orthopedics;  Laterality: Right;   US ECHOCARDIOGRAPHY  10/2008   nl LV fxn, EF 50%, mild MR, mild pulm HTN    Current Medications: Current Meds  Medication Sig   aspirin 81 MG tablet Take 81 mg by mouth daily.   Cholecalciferol (VITAMIN D3) 25 MCG (1000 UT) CAPS Take 1 capsule (1,000 Units total) by mouth daily.   insulin glargine (LANTUS) 100 UNIT/ML injection Inject 0.25 mLs (25 Units total) into the skin daily.   Insulin Pen Needle (NOVOFINE PLUS PEN NEEDLE) 32G X 4 MM MISC Use to administer Ozempic once a week.   Insulin Syringe-Needle U-100 (TRUEPLUS INSULIN SYRINGE) 31G X 5/16" 1 ML MISC USE TO INJECT INSULIN AS DIRECTED   metFORMIN (GLUCOPHAGE-XR) 500 MG 24 hr tablet Take 2 tablets (1,000 mg total) by mouth daily with breakfast.   metoprolol (TOPROL XL) 200 MG 24 hr tablet Take 1 tablet (200 mg total) by mouth daily.   ONETOUCH ULTRA test strip CHECK BLOOD SUGAR ONCE OR TWICE DAILY ASDIRECTED   polyethylene glycol (MIRALAX / GLYCOLAX) 17 g packet Take 17 g by mouth daily as needed for moderate constipation.   ramipril (ALTACE) 10 MG capsule Take 1 capsule (10 mg total) by mouth daily.   rosuvastatin (CRESTOR) 20 MG tablet Take 1 tablet (20 mg total) by mouth daily. Overdue follow up visit.  PLEASE CALL OFFICE TO SCHEDULE APPOINTMENT PRIOR TO NEXT REFILL   Semaglutide,0.25 or 0.5MG /DOS, (OZEMPIC, 0.25 OR 0.5 MG/DOSE,) 2 MG/3ML SOPN Inject 0.5 mg into the skin once a week.    Allergies:   Metformin and related and Percocet [oxycodone-acetaminophen]   Social History   Socioeconomic History   Marital status: Married    Spouse name: Not on file   Number of children: Not on file   Years of education: Not on file   Highest education level: Not on file  Occupational History   Not on file  Tobacco Use   Smoking status: Former    Current packs/day: 0.00    Average packs/day: 0.3 packs/day for 10.0 years (2.5 ttl pk-yrs)    Types: Cigarettes    Start date: 10/21/1982     Quit date: 10/20/1992    Years since quitting: 30.9   Smokeless tobacco: Former    Types: Sports administrator  Quit date: 10/20/1992  Vaping Use   Vaping status: Never Used  Substance and Sexual Activity   Alcohol use: No    Alcohol/week: 0.0 standard drinks of alcohol   Drug use: No   Sexual activity: Not on file  Other Topics Concern   Not on file  Social History Narrative   Caffeine: 1 cup coffee/day   Lives with wife, 1 dog, 5 cats.   Occupation: retired   Edu: 12th grade   Activity: no regular exercise   Diet: good water, fruits/vegetables daily   Social Drivers of Corporate investment banker Strain: Low Risk  (03/04/2020)   Overall Financial Resource Strain (CARDIA)    Difficulty of Paying Living Expenses: Not hard at all  Food Insecurity: No Food Insecurity (03/04/2020)   Hunger Vital Sign    Worried About Running Out of Food in the Last Year: Never true    Ran Out of Food in the Last Year: Never true  Transportation Needs: Not on file  Physical Activity: Inactive (03/04/2020)   Exercise Vital Sign    Days of Exercise per Week: 0 days    Minutes of Exercise per Session: 0 min  Stress: No Stress Concern Present (03/04/2020)   [employer] of Occupational Health - Occupational Stress Questionnaire    Feeling of Stress : Not at all  Social Connections: Not on file     Family History:  The patient's family history includes Cancer in his father and mother; Coronary artery disease (age of onset: 62) in his sister; Diabetes in his brother and father; Heart attack (age of onset: 20) in his sister; Heart disease in his mother. There is no history of Stroke, Colon cancer, Colon polyps, Esophageal cancer, Rectal cancer, or Stomach cancer.  ROS:   12-point review of systems is negative unless otherwise noted in the HPI.   EKGs/Labs/Other Studies Reviewed:    Studies reviewed were summarized above. The additional studies were reviewed today:  2D echo 07/2015: - Left ventricle: The  cavity size was normal. There was mild focal    basal and mild concentric hypertrophy of the septum. Systolic    function was normal. The estimated ejection fraction was in the    range of 50% to 55%. Anteroseptal wall motion hypokinesis noted,    unable to exclude dyskinesis, possibly secondary to bundle branch    block. Doppler parameters are consistent with abnormal left    ventricular relaxation (grade 1 diastolic dysfunction).  - Mitral valve: There was mild regurgitation.  - Left atrium: The atrium was mildly dilated.  - Right ventricle: Systolic function was normal.  - Pulmonary arteries: Systolic pressure was mildly elevated. PA    peak pressure: 42 mm Hg (S).   Impressions:   - Murmur possibly secondary to proximal septal thickening. No    significant outflow tract gradient. __________   2D echo 06/20/2021: 1. Left ventricular ejection fraction, by estimation, is 50 to 55%. The  left ventricle has low normal function. Left ventricular endocardial  border not optimally defined to evaluate regional wall motion. Left  ventricular diastolic parameters are  consistent with Grade I diastolic dysfunction (impaired relaxation).  Elevated left atrial pressure.   2. Right ventricular systolic function is normal. The right ventricular  size is normal. Tricuspid regurgitation signal is inadequate for assessing  PA pressure.   3. The mitral valve is grossly normal. Trivial mitral valve  regurgitation.   4. The aortic valve is tricuspid. There is mild calcification of  the  aortic valve. There is mild thickening of the aortic valve. Aortic valve  regurgitation is not visualized. Aortic valve sclerosis/calcification is  present, without any evidence of  aortic stenosis.  __________   LHC 06/26/2021:   Mid LAD lesion is 70% stenosed.   Ost LAD to Prox LAD lesion is 40% stenosed.   Ramus lesion is 60% stenosed.   The left ventricular systolic function is normal.   LV end diastolic  pressure is mildly elevated.   The left ventricular ejection fraction is 50-55% by visual estimate.   1.  Borderline significant two-vessel coronary artery disease.  Stable proximal ramus stenosis was present on previous cardiac catheterization in 2009.  There is 70% mid LAD stenosis but the proximal LAD to the ostium is moderately diseased as well. 2.  Low normal LV systolic function mildly elevated left ventricular end-diastolic pressure.   Recommendations: Recommend maximizing medical therapy before considering PCI for refractory symptoms.  PCI of the mid LAD can be considered but the proximal and ostial LAD has moderate diffuse disease and might require stenting all the way back to the ostium which is not ideal given the origin of a large ramus branch.  I added Imdur 30 mg daily. __________   2D echo 07/11/2023: 1. Left ventricular ejection fraction, by estimation, is 45 to 50%. The  left ventricle has mildly decreased function. The left ventricle  demonstrates regional wall motion abnormalities (hypokinesis of the  anterior and anteroseptal region). Left  ventricular diastolic parameters are consistent with Grade I diastolic  dysfunction (impaired relaxation). The average left ventricular global  longitudinal strain is -10.9 %.   2. Right ventricular systolic function is normal. The right ventricular  size is normal. Tricuspid regurgitation signal is inadequate for assessing  PA pressure.   3. The mitral valve is normal in structure. Mild mitral valve  regurgitation. No evidence of mitral stenosis.   4. The aortic valve is normal in structure. There is mild calcification  of the aortic valve. Aortic valve regurgitation is not visualized. Aortic  valve sclerosis is present, with no evidence of aortic valve stenosis.   5. The inferior vena cava is normal in size with greater than 50%  respiratory variability, suggesting right atrial pressure of 3 mmHg.  ___________   [facility]  08/05/2023:   Mid LAD lesion is 70% stenosed.   Ost LAD to Prox LAD lesion is 40% stenosed.   Ramus lesion is 60% stenosed.   There is mild left ventricular systolic dysfunction.   LV end diastolic pressure is moderately elevated.   The left ventricular ejection fraction is 35-45% by visual estimate.   1.  Stable two-vessel coronary artery disease.  No significant change in coronary anatomy since 2022. 2.  Moderately reduced LV systolic function. 3.  Right heart catheterization showed normal right and left-sided filling pressures, normal pulmonary pressure and normal cardiac output. 4.  Frequent PVCs noted.   Recommendations: Continue medical therapy for coronary artery disease.  I doubt that coronary artery disease is responsible for cardiomyopathy. Suspect that the worsening cardiomyopathy might be PVC induced or left bundle branch block induced given significant septal and lateral dyssynchrony on echo. Will obtain a ZIO monitor to quantify PVC burden and optimize heart failure medications. __________   Luci Bank patch 07/2023: Patient had a min HR of 73 bpm, max HR of 106 bpm, and avg HR of 87 bpm. Predominant underlying rhythm was Sinus Rhythm. Bundle Branch Block/IVCD was present.  Very frequent PVCs with  a burden of 14%.   EKG:  EKG is ordered today.  The EKG ordered today demonstrates NSR, 86 bpm, LBBB, occasional PVCs  Recent Labs: 03/19/2023: ALT 14 08/27/2023: BUN 11; Creatinine, Ser 0.69; Hemoglobin 13.3; Magnesium 1.8; Platelets 255; Potassium 4.4; Sodium 143; TSH 0.623  Recent Lipid Panel    Component Value Date/Time   CHOL 79 03/19/2023 0807   CHOL 83 (L) 09/06/2021 0821   TRIG 69.0 03/19/2023 0807   HDL 32.10 (L) 03/19/2023 0807   HDL 33 (L) 09/06/2021 0821   CHOLHDL 2 03/19/2023 0807   VLDL 13.8 03/19/2023 0807   LDLCALC 33 03/19/2023 0807   LDLCALC 35 09/06/2021 0821   LDLDIRECT 65.0 01/23/2016 0931    PHYSICAL EXAM:    VS:  BP 110/60 (BP Location: Left Arm)    Pulse 81   Ht 5\' 9"  (1.753 m)   Wt 145 lb (65.8 kg)   SpO2 100%   BMI 21.41 kg/m   BMI: Body mass index is 21.41 kg/m.  Physical Exam Vitals reviewed.  Constitutional:      Appearance: He is well-developed.  HENT:     Head: Normocephalic and atraumatic.  Eyes:     General:        Right eye: No discharge.        Left eye: No discharge.  Cardiovascular:     Rate and Rhythm: Normal rate and regular rhythm. Occasional Extrasystoles are present.    Heart sounds: Normal heart sounds, S1 normal and S2 normal. Heart sounds not distant. No midsystolic click and no opening snap. No murmur heard.    No friction rub.  Pulmonary:     Effort: Pulmonary effort is normal. No respiratory distress.     Breath sounds: Normal breath sounds. No decreased breath sounds, wheezing, rhonchi or rales.  Chest:     Chest wall: No tenderness.  Musculoskeletal:     Cervical back: Normal range of motion.     Right lower leg: No edema.     Left lower leg: No edema.  Skin:    General: Skin is warm and dry.     Nails: There is no clubbing.  Neurological:     Mental Status: He is alert and oriented to person, place, and time.  Psychiatric:        Speech: Speech normal.        Behavior: Behavior normal.        Thought Content: Thought content normal.        Judgment: Judgment normal.     Wt Readings from Last 3 Encounters:  10/01/23 145 lb (65.8 kg)  09/23/23 146 lb 4 oz (66.3 kg)  08/27/23 144 lb 12.8 oz (65.7 kg)     ASSESSMENT & PLAN:   HFrEF secondary to NICM with LBBB: Appears euvolemic and well compensated.  Recent LHC showed moderate CAD with medical management as outlined above.  Etiology of cardiomyopathy at this time is unclear, possibly related to PVC burden versus underlying LBBB.  Now on Toprol-XL in place of carvedilol in an effort to suppress PVC burden.  He otherwise remains on ramipril 10 mg.  Not requiring standing loop diuretic.  Place 3-day Zio patch as outlined below to reassess  PVC burden on metoprolol.  CAD involving the native coronary arteries with stable angina: Continues to note intermittent chest tightness, fatigue, and a sensation to take a deep ventilation.  Recent LHC showed stable CAD with medical therapy recommended as outlined above.  Continue aspirin 81  mg, Toprol-XL 200 mg, and rosuvastatin 20 mg.  No indication for further ischemic testing at this time.  Frequent PVCs: Now on Toprol-XL 200 mg in place of carvedilol.  Place in 3-day Zio patch to quantify PVC burden.  Refer to EP.  HTN: Blood pressure is well-controlled in the office today.  Continue current pharmacotherapy as outlined above.  HLD: LDL 33 in 03/2023 with normal AST/ALT at that time.  Remains on rosuvastatin 20 mg.    Disposition: F/u with Dr. Kirke Corin or an APP in 3 months.   Medication Adjustments/Labs and Tests Ordered: Current medicines are reviewed at length with the patient today.  Concerns regarding medicines are outlined above. Medication changes, Labs and Tests ordered today are summarized above and listed in the Patient Instructions accessible in Encounters.   Signed, Eula Listen, PA-C 10/01/2023 5:20 PM     [facility] [place] [address] [place], [place] [address] [phone]

## 2023-10-01 ENCOUNTER — Ambulatory Visit: Payer: Medicare Other | Attending: Physician Assistant | Admitting: Physician Assistant

## 2023-10-01 ENCOUNTER — Ambulatory Visit

## 2023-10-01 VITALS — BP 110/60 | HR 81 | Ht 69.0 in | Wt 145.0 lb

## 2023-10-01 DIAGNOSIS — I493 Ventricular premature depolarization: Secondary | ICD-10-CM

## 2023-10-01 DIAGNOSIS — E785 Hyperlipidemia, unspecified: Secondary | ICD-10-CM

## 2023-10-01 DIAGNOSIS — I428 Other cardiomyopathies: Secondary | ICD-10-CM

## 2023-10-01 DIAGNOSIS — I10 Essential (primary) hypertension: Secondary | ICD-10-CM

## 2023-10-01 DIAGNOSIS — I5022 Chronic systolic (congestive) heart failure: Secondary | ICD-10-CM

## 2023-10-01 DIAGNOSIS — I447 Left bundle-branch block, unspecified: Secondary | ICD-10-CM

## 2023-10-01 DIAGNOSIS — I25118 Atherosclerotic heart disease of native coronary artery with other forms of angina pectoris: Secondary | ICD-10-CM | POA: Diagnosis not present

## 2023-10-01 NOTE — Patient Instructions (Addendum)
 Medication Instructions:  Your physician recommends that you continue on your current medications as directed. Please refer to the Current Medication list given to you today.  *If you need a refill on your cardiac medications before your next appointment, please call your pharmacy*   Lab Work: No labs ordered today  If you have labs (blood work) drawn today and your tests are completely normal, you will receive your results only by: MyChart Message (if you have MyChart) OR A paper copy in the mail If you have any lab test that is abnormal or we need to change your treatment, we will call you to review the results.   Testing/Procedures: Your physician has recommended that you wear a Zio monitor.   This monitor is a medical device that records the heart's electrical activity. Doctors most often use these monitors to diagnose arrhythmias. Arrhythmias are problems with the speed or rhythm of the heartbeat. The monitor is a small device applied to your chest. You can wear one while you do your normal daily activities. While wearing this monitor if you have any symptoms to push the button and record what you felt. Once you have worn this monitor for the period of time provider prescribed (Usually 14 days), you will return the monitor device in the postage paid box. Once it is returned they will download the data collected and provide [place] with a report which the provider will then review and we will call you with those results. Important tips:  Avoid showering during the first 24 hours of wearing the monitor. Avoid excessive sweating to help maximize wear time. Do not submerge the device, no hot tubs, and no swimming pools. Keep any lotions or oils away from the patch. After 24 hours you may shower with the patch on. Take brief showers with your back facing the shower head.  Do not remove patch once it has been placed because that will interrupt data and decrease adhesive wear time. Push the button  when you have any symptoms and write down what you were feeling. Once you have completed wearing your monitor, remove and place into box which has postage paid and place in your outgoing mailbox.  If for some reason you have misplaced your box then call our office and we can provide another box and/or mail it off for you.   Follow-Up: At [facility], you and your health needs are our priority.  As part of our continuing mission to provide you with exceptional heart care, we have created designated Provider Care Teams.  These Care Teams include your primary Cardiologist (physician) and Advanced Practice Providers (APPs -  Physician Assistants and Nurse Practitioners) who all work together to provide you with the care you need, when you need it.   Your next appointment:   3 month(s)  Provider:   You may see Lorine Bears, MD or one of the following Advanced Practice Providers on your designated Care Team:   Nicolasa Ducking, NP Eula Listen, PA-C Cadence Fransico Michael, PA-C Charlsie Quest, NP Carlos Levering, NP        Referral to Cardiac Electrophysiology

## 2023-10-14 NOTE — Progress Notes (Signed)
 Electrophysiology Office Note:   Date:  10/15/2023  ID:  Norman Marks, DOB [birth year], MRN [id number]  Primary Cardiologist: Lorine Bears, MD Electrophysiologist: Nobie Putnam, MD      History of Present Illness:   Norman Marks is a 78 y.o. 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   Discussed the use of AI scribe software for clinical note transcription with the patient, who gave verbal consent to proceed.  History of Present Illness He has been experiencing shortness of breath and fatigue for approximately six months. The shortness of breath occurs with physical activity. He feels tired quickly and experiences difficulty breathing during these activities. He feels tired upon waking in the morning and prefers to nap in a recliner, although he can lay flat in bed.He experiences chest pain but does not feel any fluttering or skipping sensations in his heart. No significant swelling in the legs, although he notices impressions from his socks at the end of the day. This is gone the next morning. He was recently started on metoprolol and reports some improvement in symptoms. He reports feeling relatively well today with no new or acute complaints.   Review of systems complete and found to be negative unless listed in HPI.   EP Information / Studies Reviewed:    EKG is not ordered today. EKG from 10/01/23 reviewed which showed sinus rhythm w/ LBBB and PVC.     Zio 08/26/23:  Patch Wear Time:  2 days and 23 hours (2025-01-25T08:47:26-0500 to 2025-01-28T08:13:15-0500)   Patient had a min HR of 73 bpm, max HR of 106 bpm, and avg HR of 87 bpm. Predominant underlying rhythm was Sinus Rhythm. Bundle Branch Block/IVCD was present.  Very frequent PVCs with a burden of 14%.  Echo 06/2023:   1. Left ventricular ejection fraction, by estimation, is  45 to 50%. The  left ventricle has mildly decreased function. The left ventricle  demonstrates regional wall motion abnormalities (hypokinesis of the  anterior and anteroseptal region). Left  ventricular diastolic parameters are consistent with Grade I diastolic  dysfunction (impaired relaxation). The average left ventricular global  longitudinal strain is -10.9 %.   2. Right ventricular systolic function is normal. The right ventricular  size is normal. Tricuspid regurgitation signal is inadequate for assessing  PA pressure.   3. The mitral valve is normal in structure. Mild mitral valve  regurgitation. No evidence of mitral stenosis.   4. The aortic valve is normal in structure. There is mild calcification  of the aortic valve. Aortic valve regurgitation is not visualized. Aortic  valve sclerosis is present, with no evidence of aortic valve stenosis.   5. The inferior vena cava is normal in size with greater than 50%  respiratory variability, suggesting right atrial pressure of 3 mmHg.   RHC/LHC 08/05/23:    Mid LAD lesion is 70% stenosed.   Ost LAD to Prox LAD lesion is 40% stenosed.   Ramus lesion is 60% stenosed.   There is mild left ventricular systolic dysfunction.   LV end diastolic pressure is moderately elevated.   The left ventricular ejection fraction is 35-45% by visual estimate.   1.  Stable two-vessel coronary artery disease.  No significant change in coronary anatomy since 2022. 2.  Moderately reduced LV systolic function. 3.  Right heart catheterization showed normal right and  left-sided filling pressures, normal pulmonary pressure and normal cardiac output. 4.  Frequent PVCs noted.  Physical Exam:   VS:  BP 124/60 (BP Location: Left Arm, Patient Position: Sitting, Cuff Size: Normal)   Pulse (!) 51   Ht 5\' 9"  (1.753 m)   Wt 144 lb (65.3 kg)   SpO2 97%   BMI 21.27 kg/m    Wt Readings from Last 3 Encounters:  10/15/23 144 lb (65.3 kg)  10/01/23 145 lb (65.8 kg)   09/23/23 146 lb 4 oz (66.3 kg)     GEN: Well nourished, well developed in no acute distress NECK: No JVD CARDIAC: Normal rate, irregular rhythm RESPIRATORY:  Clear to auscultation without rales, wheezing or rhonchi  ABDOMEN: Soft, non-distended EXTREMITIES:  No edema; No deformity   ASSESSMENT AND PLAN:    #Frequent PVCs: Isoelectric in V1, V3 transition, right inferior axis. Burden 14% on Zio. Left bundle branch block dates back to at least 2010.  PVCs, however, are new.  Given that his EF is not severely reduced, I suspect that frequent PVCs are the more likely etiology of his symptoms. -Patient recently started metoprolol with improvement in his symptoms.  Continue metoprolol 200 mg once daily. -Discussed treatment options today for PVCs including antiarrhythmic drug therapy and ablation. Discussed risks, recovery and likelihood of success with each treatment strategy. Risk, benefits, and alternatives to EP study and ablation for PVCs were discussed.  Patient does not feel like his symptoms are severe enough to warrant ablation at this time.  I think this is reasonable given his age.  We will start mexiletine 200 mg twice daily in hopes of suppressing PVCs further and improving symptoms. -Repeat Zio monitor in a few weeks after being on mexiletine to assess burden.  #LBBB: Left bundle branch block dates back to at least 2010.  PVCs, however, are new.  Given that his EF is not severely reduced, I suspect that frequent PVCs are the more likely etiology of his symptoms. #Chronic systolic heart failure with mildly reduced LVEF: Last EF 45-50%.  He does not have overt clinical heart failure.  I suspect that his symptoms are more from frequent PVCs rather than heart failure/PVC induced cardiomyopathy. -Continue metoprolol 200 mg once daily, ramipril 10 mg once daily.  Follow-up with primary cardiologist.  #Hypertension -At goal today.  Recommend checking blood pressures 1-2 times per week at  home and recording the values.  Recommend bringing these recordings to the primary care physician.  Follow up with EP APP in 4 weeks  Signed, Nobie Putnam, MD

## 2023-10-15 ENCOUNTER — Encounter: Payer: Self-pay | Admitting: Cardiology

## 2023-10-15 ENCOUNTER — Ambulatory Visit: Attending: Cardiology | Admitting: Cardiology

## 2023-10-15 VITALS — BP 124/60 | HR 51 | Ht 69.0 in | Wt 144.0 lb

## 2023-10-15 DIAGNOSIS — I447 Left bundle-branch block, unspecified: Secondary | ICD-10-CM

## 2023-10-15 DIAGNOSIS — I10 Essential (primary) hypertension: Secondary | ICD-10-CM | POA: Diagnosis not present

## 2023-10-15 DIAGNOSIS — I493 Ventricular premature depolarization: Secondary | ICD-10-CM | POA: Diagnosis not present

## 2023-10-15 DIAGNOSIS — I5022 Chronic systolic (congestive) heart failure: Secondary | ICD-10-CM

## 2023-10-15 MED ORDER — MEXILETINE HCL 200 MG PO CAPS: 200.0000 mg | ORAL_CAPSULE | Freq: Two times a day (BID) | ORAL | 3 refills | Status: AC

## 2023-10-15 NOTE — Patient Instructions (Signed)
 Medication Instructions:  Your physician has recommended you make the following change in your medication:  1) START taking mexiletine 200 mg twice daily *If you need a refill on your cardiac medications before your next appointment, please call your pharmacy*  Follow-Up: At [facility], you and your health needs are our priority.  As part of our continuing mission to provide you with exceptional heart care, our providers are all part of one team.  This team includes your primary Cardiologist (physician) and Advanced Practice Providers or APPs (Physician Assistants and Nurse Practitioners) who all work together to provide you with the care you need, when you need it.  Your next appointment:   1 month  Provider:   Sherie Don, NP

## 2023-10-19 DIAGNOSIS — I493 Ventricular premature depolarization: Secondary | ICD-10-CM | POA: Diagnosis not present

## 2023-11-06 ENCOUNTER — Telehealth: Payer: Self-pay | Admitting: Cardiovascular Disease

## 2023-11-06 NOTE — Telephone Encounter (Signed)
 Received call that they looked over the event monitor results and found some changes.   The change in PVC burden, and PAC's showed rare, and the run of SVT-   Advised I would notify that changes to the report would be made.

## 2023-11-06 NOTE — Telephone Encounter (Signed)
 Everything they recommended to change was already reviewed by our MD anyway. They just wanted us  to be aware they were going to update the report. Nothing needed, just as an FYI.   Thanks!

## 2023-11-06 NOTE — Telephone Encounter (Signed)
 I am confused.  This monitor was read by our MD and resulted on 04/5.  Who is the caller and what are they changing?

## 2023-11-06 NOTE — Telephone Encounter (Signed)
 Caller Bearl Limes) is reporting abnormal results.

## 2023-11-11 ENCOUNTER — Other Ambulatory Visit: Payer: Self-pay | Admitting: Cardiovascular Disease

## 2023-11-20 NOTE — Progress Notes (Unsigned)
 Electrophysiology Clinic Note    Date:  11/21/2023  Patient ID:  Norman Marks, Signs 08-22-1945, MRN [id number] PCP:  Claire Crick, MD  Cardiologist:  Antionette Kirks, MD Electrophysiologist: Ardeen Kohler, MD   Discussed the use of AI scribe software for clinical note transcription with the patient, who gave verbal consent to proceed.   Patient Profile    Chief Complaint: PVC follow-up  History of Present Illness: Norman Marks is a 78 y.o. male with PMH notable for CAD, HFrEF, PVCs, LBBB, T2DM, HTN, HLD; seen today for Ardeen Kohler, MD for routine electrophysiology followup.   He last saw Dr. Daneil Dunker 10/2023 for initial EP consult.  During that visit he described shortness of breath and fatigue X 6 months, no palpitations.  Recent ZIO monitor with 14% PVC burden.  Dr. Daneil Dunker thought PVCs were likely etiology of his symptoms, continued toprol  200 mg daily.  They discussed PVC ablation, and patient did not feel his symptoms were severe enough to warrant procedure at this time.  Mexiletine 200 mg twice daily was started.  On follow-up today, he misunderstood the medication directions and stopped the toprol  when he started mexiletine. He states his fatigue and SOB have significantly improved. He remains very active during the day gardening and assembling a swim spa he and his wife recently purchased.  He denies chest pain, chest pressure, palpitations, SOB. He is very happy with his improvement in fatigue.     Arrhythmia/Device History Mexiletine       ROS:  Please see the history of present illness. All other systems are reviewed and otherwise negative.    Physical Exam    VS:  BP 130/62 (BP Location: Left Arm, Patient Position: Sitting, Cuff Size: Normal)   Pulse 85   Ht 5\' 9"  (1.753 m)   Wt 138 lb 3.2 oz (62.7 kg)   SpO2 99%   BMI 20.41 kg/m  BMI: Body mass index is 20.41 kg/m.  Wt Readings from Last 3 Encounters:  11/21/23 138 lb 3.2 oz (62.7 kg)  10/15/23  144 lb (65.3 kg)  10/01/23 145 lb (65.8 kg)     GEN- The patient is well appearing, alert and oriented x 3 today.   Lungs- Clear to ausculation bilaterally, normal work of breathing.  Heart- Regular rate and rhythm, no murmurs, rubs or gallops Extremities- No peripheral edema, warm, dry    Studies Reviewed   Previous EP, cardiology notes.    EKG is ordered. Personal review of EKG from today shows:    EKG Interpretation Date/Time:  Thursday Nov 21 2023 09:34:33 EDT Ventricular Rate:  85 PR Interval:  130 QRS Duration:  138 QT Interval:  396 QTC Calculation: 471 R Axis:   -9  Text Interpretation: Normal sinus rhythm Left bundle branch block Confirmed by Hamp Moreland [phone]) on 11/21/2023 9:40:08 AM     11/21/2023 rhythm strip - SR, no PVCs    Long term monitor, 10/18/2023 HR 69 - 162, average 85 bpm. 4 nonsustained SVT (longest 8 beats). No atrial fibrillation detected. Occasional supraventricular ectopy, 2.6%. Rare ventricular ectopy. No sustained arrhythmias. There was 1 symptom trigger episode, which corresponds to sinus rhythm with rare ectopy.  Long term monitor, 08/26/2023 Patient had a min HR of 73 bpm, max HR of 106 bpm, and avg HR of 87 bpm. Predominant underlying rhythm was Sinus Rhythm. Bundle Branch Block/IVCD was present.  Very frequent PVCs with a burden of 14%.  R/LHC, 08/05/2023   Mid LAD  lesion is 70% stenosed.   Ost LAD to Prox LAD lesion is 40% stenosed.   Ramus lesion is 60% stenosed.   There is mild left ventricular systolic dysfunction.   LV end diastolic pressure is moderately elevated.   The left ventricular ejection fraction is 35-45% by visual estimate.   1.  Stable two-vessel coronary artery disease.  No significant change in coronary anatomy since 2022. 2.  Moderately reduced LV systolic function. 3.  Right heart catheterization showed normal right and left-sided filling pressures, normal pulmonary pressure and normal cardiac output. 4.   Frequent PVCs noted.  TTE, 07/11/2023  1. Left ventricular ejection fraction, by estimation, is 45 to 50%. The left ventricle has mildly decreased function. The left ventricle demonstrates regional wall motion abnormalities (hypokinesis of the anterior and anteroseptal region). Left ventricular diastolic parameters are consistent with Grade I diastolic  dysfunction (impaired relaxation). The average left ventricular global longitudinal strain is -10.9 %.   2. Right ventricular systolic function is normal. The right ventricular size is normal. Tricuspid regurgitation signal is inadequate for assessing PA pressure.   3. The mitral valve is normal in structure. Mild mitral valve regurgitation. No evidence of mitral stenosis.   4. The aortic valve is normal in structure. There is mild calcification of the aortic valve. Aortic valve regurgitation is not visualized. Aortic valve sclerosis is present, with no evidence of aortic valve stenosis.   5. The inferior vena cava is normal in size with greater than 50% respiratory variability, suggesting right atrial pressure of 3 mmHg.      Assessment and Plan     #) PVC #) LBBB #) HFmrEF No longer having fatigue or dyspnea Significant improvement in PVC burden via recent zio and today's rhythm strip Will restart toprol  at 25mg  daily for HFmrEF Continue 200mg  mexiletine BID         Current medicines are reviewed at length with the patient today.   The patient does not have concerns regarding his medicines.  The following changes were made today:   START 25mg  toprol  daily  Labs/ tests ordered today include:  Orders Placed This Encounter  Procedures   EKG 12-Lead     Disposition: Follow up with Dr. Daneil Dunker or EP APP in 6 months  Follow-up with gen cards (PA Dunn) in about 3 months   Signed, Denina Rieger, NP  11/21/23  4:54 PM  Electrophysiology [facility]

## 2023-11-21 ENCOUNTER — Ambulatory Visit: Attending: Cardiology | Admitting: Cardiology

## 2023-11-21 VITALS — BP 130/62 | HR 85 | Ht 69.0 in | Wt 138.2 lb

## 2023-11-21 DIAGNOSIS — I447 Left bundle-branch block, unspecified: Secondary | ICD-10-CM | POA: Diagnosis not present

## 2023-11-21 DIAGNOSIS — I493 Ventricular premature depolarization: Secondary | ICD-10-CM

## 2023-11-21 DIAGNOSIS — I5022 Chronic systolic (congestive) heart failure: Secondary | ICD-10-CM

## 2023-11-21 MED ORDER — METOPROLOL SUCCINATE ER 25 MG PO TB24: 25.0000 mg | ORAL_TABLET | Freq: Every day | ORAL | 3 refills | Status: AC

## 2023-11-21 NOTE — Patient Instructions (Signed)
 Medication Instructions:  Start Metoprolol  Succinate 25 mg daily   *If you need a refill on your cardiac medications before your next appointment, please call your pharmacy*  Follow-Up: At [facility], you and your health needs are our priority.  As part of our continuing mission to provide you with exceptional heart care, our providers are all part of one team.  This team includes your primary Cardiologist (physician) and Advanced Practice Providers or APPs (Physician Assistants and Nurse Practitioners) who all work together to provide you with the care you need, when you need it.  Your next appointment:   3 month(s)  Provider:   Varney Gentleman, PA-C   6 months with Dr.Parker or Suzann Riddle, NP   We recommend signing up for the patient portal called "MyChart".  Sign up information is provided on this After Visit Summary.  MyChart is used to connect with patients for Virtual Visits (Telemedicine).  Patients are able to view lab/test results, encounter notes, upcoming appointments, etc.  Non-urgent messages can be sent to your provider as well.   To learn more about what you can do with MyChart, go to [email or web address].   Other Instructions Take blood pressure and heart rate at home.   Please see primary care provider for weight loss.

## 2023-12-04 ENCOUNTER — Encounter: Payer: Self-pay | Admitting: Dermatology

## 2023-12-04 ENCOUNTER — Ambulatory Visit: Payer: Medicare Other | Admitting: Dermatology

## 2023-12-04 DIAGNOSIS — D1801 Hemangioma of skin and subcutaneous tissue: Secondary | ICD-10-CM

## 2023-12-04 DIAGNOSIS — L570 Actinic keratosis: Secondary | ICD-10-CM | POA: Diagnosis not present

## 2023-12-04 DIAGNOSIS — D229 Melanocytic nevi, unspecified: Secondary | ICD-10-CM

## 2023-12-04 DIAGNOSIS — C44519 Basal cell carcinoma of skin of other part of trunk: Secondary | ICD-10-CM

## 2023-12-04 DIAGNOSIS — Z79899 Other long term (current) drug therapy: Secondary | ICD-10-CM

## 2023-12-04 DIAGNOSIS — Z8589 Personal history of malignant neoplasm of other organs and systems: Secondary | ICD-10-CM

## 2023-12-04 DIAGNOSIS — W908XXA Exposure to other nonionizing radiation, initial encounter: Secondary | ICD-10-CM | POA: Diagnosis not present

## 2023-12-04 DIAGNOSIS — C4441 Basal cell carcinoma of skin of scalp and neck: Secondary | ICD-10-CM

## 2023-12-04 DIAGNOSIS — L820 Inflamed seborrheic keratosis: Secondary | ICD-10-CM | POA: Diagnosis not present

## 2023-12-04 DIAGNOSIS — Z7189 Other specified counseling: Secondary | ICD-10-CM

## 2023-12-04 DIAGNOSIS — Z1283 Encounter for screening for malignant neoplasm of skin: Secondary | ICD-10-CM | POA: Diagnosis not present

## 2023-12-04 DIAGNOSIS — D492 Neoplasm of unspecified behavior of bone, soft tissue, and skin: Secondary | ICD-10-CM | POA: Diagnosis not present

## 2023-12-04 DIAGNOSIS — Z5111 Encounter for antineoplastic chemotherapy: Secondary | ICD-10-CM

## 2023-12-04 DIAGNOSIS — L814 Other melanin hyperpigmentation: Secondary | ICD-10-CM

## 2023-12-04 DIAGNOSIS — D485 Neoplasm of uncertain behavior of skin: Secondary | ICD-10-CM

## 2023-12-04 DIAGNOSIS — L578 Other skin changes due to chronic exposure to nonionizing radiation: Secondary | ICD-10-CM | POA: Diagnosis not present

## 2023-12-04 DIAGNOSIS — Z85828 Personal history of other malignant neoplasm of skin: Secondary | ICD-10-CM

## 2023-12-04 DIAGNOSIS — L821 Other seborrheic keratosis: Secondary | ICD-10-CM

## 2023-12-04 NOTE — Progress Notes (Signed)
 Follow-Up Visit   Subjective  Norman Marks is a 78 y.o. male who presents for the following: Skin Cancer Screening and Upper Body Skin Exam  The patient presents for Upper Body Skin Exam (UBSE) for skin cancer screening and mole check. The patient has spots, moles and lesions to be evaluated, some may be new or changing and the patient may have concern these could be cancer.   The following portions of the chart were reviewed this encounter and updated as appropriate: medications, allergies, medical history  Review of Systems:  No other skin or systemic complaints except as noted in HPI or Assessment and Plan.  Objective  Well appearing patient in no apparent distress; mood and affect are within normal limits.  All skin waist up examined. Relevant physical exam findings are noted in the Assessment and Plan.  Back x 6, face/ear/arms/hands x 23 (29) Erythematous thin papules/macules with gritty scale.  Back x 1, L mid medial calf x 1 (2) Erythematous stuck-on, waxy papule or plaque L lat clavicle 2.1 cm pink scaly crusted patch.   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 They are chronic with expected duration over 1 year. A portion of actinic keratoses will progress to squamous cell carcinoma of the skin. It is not possible to reliably predict which spots will progress to skin cancer and so treatment is recommended to prevent development of skin cancer.  Recommend daily broad spectrum sunscreen SPF 30+ to sun-exposed areas, reapply every 2 hours as needed.  Recommend staying in the shade or wearing long sleeves, sun glasses (UVA+UVB protection) and wide brim hats (4-inch brim around the entire circumference of the hat). Call for new or changing lesions.  Destruction of lesion - Back x 6, face/ear/arms/hands x  23 (29) Complexity: simple   Destruction method: cryotherapy   Informed consent: discussed and consent obtained   Timeout:  patient name, date of birth, surgical site, and procedure verified Lesion destroyed using liquid nitrogen: Yes   Region frozen until ice ball extended beyond lesion: Yes   Outcome: patient tolerated procedure well with no complications   Post-procedure details: wound care instructions given   INFLAMED SEBORRHEIC KERATOSIS (2) Back x 1, L mid medial calf x 1 (2) Symptomatic, irritating, patient would like treated.  Destruction of lesion - Back x 1, L mid medial calf x 1 (2) Complexity: simple   Destruction method: cryotherapy   Informed consent: discussed and consent obtained   Timeout:  patient name, date of birth, surgical site, and procedure verified Lesion destroyed using liquid nitrogen: Yes   Region frozen until ice ball extended beyond lesion: Yes   Outcome: patient tolerated procedure well with no complications   Post-procedure details: wound care instructions given   NEOPLASM OF UNCERTAIN BEHAVIOR OF SKIN L lat clavicle Epidermal / dermal shaving  Lesion diameter (cm):  2.1 Informed consent: discussed and consent obtained   Timeout: patient name, date of birth, surgical site, and procedure verified   Procedure prep:  Patient was prepped and draped in usual sterile fashion Prep type:  Isopropyl alcohol Anesthesia: the lesion was anesthetized in a standard fashion   Anesthetic:  1% lidocaine  w/ epinephrine  1-100,000 buffered w/ 8.4% NaHCO3 Instrument used: flexible razor blade   Hemostasis achieved with: pressure, aluminum chloride and electrodesiccation   Outcome: patient tolerated procedure well  Post-procedure details: sterile dressing applied and wound care instructions given   Dressing type: bandage (Mupirocin 2% ointment)    Destruction of lesion Complexity: extensive   Destruction method: electrodesiccation and curettage   Informed  consent: discussed and consent obtained   Timeout:  patient name, date of birth, surgical site, and procedure verified Procedure prep:  Patient was prepped and draped in usual sterile fashion Prep type:  Isopropyl alcohol Anesthesia: the lesion was anesthetized in a standard fashion   Anesthetic:  1% lidocaine  w/ epinephrine  1-100,000 buffered w/ 8.4% NaHCO3 Curettage performed in three different directions: Yes   Electrodesiccation performed over the curetted area: Yes   Lesion length (cm):  2.1 Lesion width (cm):  2.1 Hemostasis achieved with:  pressure, aluminum chloride and electrodesiccation Outcome: patient tolerated procedure well with no complications   Post-procedure details: sterile dressing applied and wound care instructions given   Dressing type: bandage and petrolatum   Specimen 1 - Surgical pathology Differential Diagnosis: D48.5 r/o BCC ED&C today Check Margins: No SKIN CANCER SCREENING   ACTINIC SKIN DAMAGE   HISTORY OF BASAL CELL CARCINOMA   HISTORY OF SQUAMOUS CELL CARCINOMA   LENTIGO   MELANOCYTIC NEVUS, UNSPECIFIED LOCATION   CHEMOTHERAPY MANAGEMENT, ENCOUNTER FOR   COUNSELING AND COORDINATION OF CARE   MEDICATION MANAGEMENT    ACTINIC DAMAGE WITH PRECANCEROUS ACTINIC KERATOSES Counseling for Topical Chemotherapy Management: Patient exhibits: - Severe, confluent actinic changes with pre-cancerous actinic keratoses that is secondary to cumulative UV radiation exposure over time - Condition that is severe; chronic, not at goal. - diffuse scaly erythematous macules and papules with underlying dyspigmentation - Discussed Prescription "Field Treatment" topical Chemotherapy for Severe, Chronic Confluent Actinic Changes with Pre-Cancerous Actinic Keratoses Field treatment involves treatment of an entire area of skin that has confluent Actinic Changes (Sun/ Ultraviolet light damage) and PreCancerous Actinic Keratoses by method of PhotoDynamic Therapy  (PDT) and/or prescription Topical Chemotherapy agents such as 5-fluorouracil , 5-fluorouracil /calcipotriene, and/or imiquimod .  The purpose is to decrease the number of clinically evident and subclinical PreCancerous lesions to prevent progression to development of skin cancer by chemically destroying early precancer changes that may or may not be visible.  It has been shown to reduce the risk of developing skin cancer in the treated area. As a result of treatment, redness, scaling, crusting, and open sores may occur during treatment course. One or more than one of these methods may be used and may have to be used several times to control, suppress and eliminate the PreCancerous changes. Discussed treatment course, expected reaction, and possible side effects. - Recommend daily broad spectrum sunscreen SPF 30+ to sun-exposed areas, reapply every 2 hours as needed.  - Staying in the shade or wearing long sleeves, sun glasses (UVA+UVB protection) and wide brim hats (4-inch brim around the entire circumference of the hat) are also recommended. - Call for new or changing lesions. - On July 5th start 5FU BID x 10 days on the tops of the arms and hands.  Lentigines, Seborrheic Keratoses, Hemangiomas - Benign normal skin lesions - Benign-appearing - Call for any changes  Melanocytic Nevi - Tan-brown and/or pink-flesh-colored symmetric macules and papules - Benign appearing on exam today - Observation - Call clinic for new or changing moles - Recommend daily use of broad spectrum spf 30+ sunscreen to sun-exposed areas.   HISTORY OF BASAL CELL CARCINOMA OF THE SKIN - No evidence of recurrence today - Recommend regular full body skin exams - Recommend daily broad spectrum sunscreen SPF  30+ to sun-exposed areas, reapply every 2 hours as needed.  - Call if any new or changing lesions are noted between office visits  HISTORY OF SQUAMOUS CELL CARCINOMA OF THE SKIN - No evidence of recurrence today - No  lymphadenopathy - Recommend regular full body skin exams - Recommend daily broad spectrum sunscreen SPF 30+ to sun-exposed areas, reapply every 2 hours as needed.  - Call if any new or changing lesions are noted between office visits  Skin cancer screening performed today.  Return in about 6 months (around 06/05/2024) for TBSE - Hx BCC, SCC.  I, Mara Seminole, CMA, am acting as scribe for Celine Collard, MD .   Documentation: I have reviewed the above documentation for accuracy and completeness, and I agree with the above.  Celine Collard, MD

## 2023-12-04 NOTE — Patient Instructions (Addendum)

## 2023-12-11 LAB — SURGICAL PATHOLOGY

## 2023-12-12 ENCOUNTER — Other Ambulatory Visit: Payer: Self-pay | Admitting: Family Medicine

## 2023-12-12 DIAGNOSIS — Z794 Long term (current) use of insulin: Secondary | ICD-10-CM

## 2023-12-16 ENCOUNTER — Ambulatory Visit: Payer: Self-pay | Admitting: Dermatology

## 2023-12-17 ENCOUNTER — Encounter: Payer: Self-pay | Admitting: Dermatology

## 2023-12-17 NOTE — Telephone Encounter (Signed)
 Left voicemail to return my call

## 2023-12-17 NOTE — Telephone Encounter (Signed)
-----   Message from Celine Collard sent at 12/16/2023  7:20 PM EDT ----- FINAL DIAGNOSIS        1. Skin, L lat clavicle :       BASAL CELL CARCINOMA, NODULAR PATTERN   Cancer = BCC Already treated Recheck next visit

## 2023-12-19 ENCOUNTER — Encounter: Payer: Self-pay | Admitting: Dermatology

## 2023-12-19 NOTE — Telephone Encounter (Addendum)
 Called and notified patient of results. He verbalized understanding and denied questions.   ----- Message from Celine Collard sent at 12/16/2023  7:20 PM EDT ----- FINAL DIAGNOSIS        1. Skin, L lat clavicle :       BASAL CELL CARCINOMA, NODULAR PATTERN   Cancer = BCC Already treated Recheck next visit

## 2023-12-24 ENCOUNTER — Encounter: Payer: Self-pay | Admitting: Family Medicine

## 2023-12-24 ENCOUNTER — Ambulatory Visit (INDEPENDENT_AMBULATORY_CARE_PROVIDER_SITE_OTHER): Admitting: Family Medicine

## 2023-12-24 VITALS — BP 142/76 | HR 72 | Temp 97.8°F | Ht 69.0 in | Wt 140.0 lb

## 2023-12-24 DIAGNOSIS — Z794 Long term (current) use of insulin: Secondary | ICD-10-CM

## 2023-12-24 DIAGNOSIS — E1169 Type 2 diabetes mellitus with other specified complication: Secondary | ICD-10-CM

## 2023-12-24 DIAGNOSIS — R053 Chronic cough: Secondary | ICD-10-CM | POA: Diagnosis not present

## 2023-12-24 DIAGNOSIS — I10 Essential (primary) hypertension: Secondary | ICD-10-CM | POA: Diagnosis not present

## 2023-12-24 DIAGNOSIS — I5022 Chronic systolic (congestive) heart failure: Secondary | ICD-10-CM | POA: Diagnosis not present

## 2023-12-24 LAB — POCT GLYCOSYLATED HEMOGLOBIN (HGB A1C): Hemoglobin A1C: 6.2 % — AB (ref 4.0–5.6)

## 2023-12-24 MED ORDER — LOSARTAN POTASSIUM 50 MG PO TABS: 50.0000 mg | ORAL_TABLET | Freq: Every day | ORAL | 2 refills | Status: DC

## 2023-12-24 NOTE — Progress Notes (Signed)
 Ph: [phone] Fax: [phone]   Patient ID: Norman Marks, male    DOB: 1946/01/07, 78 y.o.   MRN: [id number]  This visit was conducted in person.  BP (!) 142/76   Pulse 72   Temp 97.8 F (36.6 C) (Oral)   Ht 5\' 9"  (1.753 m)   Wt 140 lb (63.5 kg)   SpO2 98%   BMI 20.67 kg/m   BP Readings from Last 3 Encounters:  12/24/23 (!) 142/76  11/21/23 130/62  10/15/23 124/60  152/76 on retesting  CC: 58mo DM f/u visit  Subjective:   HPI: Norman Marks is a 78 y.o. male presenting on 12/24/2023 for Medical Management of Chronic Issues (Here for 3 mo DM f/u.)   Recent BCC removal by dermatology.   Tickle in back of throat leading to cough present for the past several months.   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 Seeing cardiology. BB changed to Toprol  XL. Started on mexiletine 200mg  bid to further suppress PVCs. Notes some improvement to energy levels.   Wonders of ozempic  is contributing to decreased appetite. 6 lb weight loss since last seen.   DM - does regularly check sugars overall well controlled. Compliant with antihyperglycemic regimen which includes: lantus  25u qhs, metformin  XR 1000mg  daily, ozempic  0.5mg  weekly. Few low sugars to 59. Denies paresthesias, blurry vision. Last diabetic eye exam 07/2023. Glucometer brand: onetouch ultra. Last foot exam: 09/2023. DSME: completed 2017.  CGM data: freestyle libre 3 30 day average sugar: 149, time in range 74%, hypoglycemia 2%, above range 24% Lab Results  Component Value Date   HGBA1C 6.2 (A) 12/24/2023   Diabetic Foot Exam - Simple   No data filed    Lab Results  Component Value Date   MICROALBUR 1.4 03/19/2023         Relevant past medical, surgical, family and social history reviewed and updated as indicated. Interim medical history since our last visit reviewed. Allergies and medications reviewed and updated. Outpatient Medications Prior to Visit  Medication Sig Dispense  Refill   aspirin  81 MG tablet Take 81 mg by mouth daily.     Cholecalciferol (VITAMIN D3) 25 MCG (1000 UT) CAPS Take 1 capsule (1,000 Units total) by mouth daily. 30 capsule    insulin  glargine (LANTUS ) 100 UNIT/ML injection Inject 0.25 mLs (25 Units total) into the skin daily. 30 mL 2   Insulin  Pen Needle (NOVOFINE PLUS PEN NEEDLE) 32G X 4 MM MISC Use to administer Ozempic  once a week. 30 each 0   metFORMIN  (GLUCOPHAGE -XR) 500 MG 24 hr tablet Take 2 tablets (1,000 mg total) by mouth daily with breakfast. 180 tablet 3   metoprolol  succinate (TOPROL  XL) 25 MG 24 hr tablet Take 1 tablet (25 mg total) by mouth daily. 90 tablet 3   mexiletine (MEXITIL) 200 MG capsule Take 1 capsule (200 mg total) by mouth 2 (two) times daily. 180 capsule 3   ONETOUCH ULTRA test strip CHECK BLOOD SUGAR ONCE OR TWICE DAILY ASDIRECTED 100 each 3   polyethylene glycol (MIRALAX  / GLYCOLAX ) 17 g packet Take 17 g by mouth daily as needed for moderate constipation.     rosuvastatin  (CRESTOR ) 20 MG tablet TAKE ONE TABLET (20 MG TOTAL) BY MOUTH DAILY. OVERDUE FOLLOW UP VISIT. PLEASE CALL OFFICE TO SCHEDULE APPOINTMENT PRIOR TO NEXT REFILL 90 tablet 0   TRUEPLUS INSULIN  SYRINGE 31G X 5/16" 1 ML MISC USE TO INJECT INSULIN  AS DIRECTED 100 each 4  ramipril  (ALTACE ) 10 MG capsule Take 1 capsule (10 mg total) by mouth daily. 90 capsule 4   Semaglutide ,0.25 or 0.5MG /DOS, (OZEMPIC , 0.25 OR 0.5 MG/DOSE,) 2 MG/3ML SOPN Inject 0.5 mg into the skin once a week. 3 mL 11   No facility-administered medications prior to visit.     Per HPI unless specifically indicated in ROS section below Review of Systems  Objective:  BP (!) 142/76   Pulse 72   Temp 97.8 F (36.6 C) (Oral)   Ht 5\' 9"  (1.753 m)   Wt 140 lb (63.5 kg)   SpO2 98%   BMI 20.67 kg/m   Wt Readings from Last 3 Encounters:  12/24/23 140 lb (63.5 kg)  11/21/23 138 lb 3.2 oz (62.7 kg)  10/15/23 144 lb (65.3 kg)      Physical Exam Vitals and nursing note reviewed.   Constitutional:      Appearance: Normal appearance. He is not ill-appearing.  HENT:     Head: Normocephalic and atraumatic.     Mouth/Throat:     Mouth: Mucous membranes are moist.     Pharynx: Oropharynx is clear. No oropharyngeal exudate or posterior oropharyngeal erythema.  Eyes:     Extraocular Movements: Extraocular movements intact.     Conjunctiva/sclera: Conjunctivae normal.     Pupils: Pupils are equal, round, and reactive to light.  Cardiovascular:     Rate and Rhythm: Normal rate and regular rhythm.     Pulses: Normal pulses.     Heart sounds: Normal heart sounds. No murmur heard. Pulmonary:     Effort: Pulmonary effort is normal. No respiratory distress.     Breath sounds: Normal breath sounds. No wheezing, rhonchi or rales.  Musculoskeletal:     Cervical back: Normal range of motion and neck supple.     Right lower leg: No edema.     Left lower leg: No edema.     Comments: See HPI for foot exam if done  Lymphadenopathy:     Cervical: No cervical adenopathy.  Skin:    General: Skin is warm and dry.     Findings: No rash.  Neurological:     Mental Status: He is alert.  Psychiatric:        Mood and Affect: Mood normal.        Behavior: Behavior normal.       Results for orders placed or performed in visit on 12/24/23  POCT glycosylated hemoglobin (Hb A1C)   Collection Time: 12/24/23  8:18 AM  Result Value Ref Range   Hemoglobin A1C 6.2 (A) 4.0 - 5.6 %   HbA1c POC (<> result, manual entry)     HbA1c, POC (prediabetic range)     HbA1c, POC (controlled diabetic range)      Assessment & Plan:   Problem List Items Addressed This Visit     Type 2 diabetes mellitus with other specified complication (HCC) - Primary   Chronic, improved on current regimen. Congratulated. Notes ongoing anorexia and weight loss - will stop ozempic .  Continue metformin , discussed lantus  titration as needed to keep fasting sugars controlled. Reassess control at 3 mo CPE       Relevant Medications   losartan (COZAAR) 50 MG tablet   Other Relevant Orders   POCT glycosylated hemoglobin (Hb A1C) (Completed)   HTN (hypertension)   Chronic, above goal.  Reassess control with transition from ramipril  10mg  to losartan 50mg  daily.      Relevant Medications   losartan (COZAAR) 50 MG  tablet   Cough   Persistent dry nagging cough present for the past several months - suspect ACEI induced cough. Will stop ramipril  and start losartan 50mg  daily. Monitor for effect.       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       Relevant Medications   losartan (COZAAR) 50 MG tablet     Meds ordered this encounter  Medications   losartan (COZAAR) 50 MG tablet    Sig: Take 1 tablet (50 mg total) by mouth daily.    Dispense:  90 tablet    Refill:  2    To replace ramipril     Orders Placed This Encounter  Procedures   POCT glycosylated hemoglobin (Hb A1C)    Patient Instructions  Sugars are doing great! Go ahead and stop ozempic .  You may increase lantus  as needed if fasting sugar starts trending up >130.  Return in 3 months for physical /wellness visit.  Stop ramipril , in its place start losartan 50mg  daily. Monitor effect on tickle in throat/dry cough   Follow up plan: Return in about 3 months (around 03/25/2024) for medicare wellness visit, annual exam, prior fasting for blood work.  Claire Crick, MD

## 2023-12-24 NOTE — Patient Instructions (Addendum)
 Sugars are doing great! Go ahead and stop ozempic .  You may increase lantus  as needed if fasting sugar starts trending up >130.  Return in 3 months for physical /wellness visit.  Stop ramipril , in its place start losartan 50mg  daily. Monitor effect on tickle in throat/dry cough

## 2023-12-24 NOTE — Assessment & Plan Note (Addendum)
 Appreciate cardiology and EP care now on Toprol  XL + mexiletine.

## 2023-12-24 NOTE — Assessment & Plan Note (Addendum)
 Chronic, above goal.  Reassess control with transition from ramipril  10mg  to losartan 50mg  daily.

## 2023-12-24 NOTE — Assessment & Plan Note (Signed)
 Persistent dry nagging cough present for the past several months - suspect ACEI induced cough. Will stop ramipril  and start losartan 50mg  daily. Monitor for effect.

## 2023-12-24 NOTE — Assessment & Plan Note (Signed)
 Chronic, improved on current regimen. Congratulated. Notes ongoing anorexia and weight loss - will stop ozempic .  Continue metformin , discussed lantus  titration as needed to keep fasting sugars controlled. Reassess control at 3 mo CPE

## 2024-01-03 ENCOUNTER — Ambulatory Visit: Admitting: Physician Assistant

## 2024-02-10 ENCOUNTER — Other Ambulatory Visit: Payer: Self-pay | Admitting: Cardiovascular Disease

## 2024-02-24 ENCOUNTER — Other Ambulatory Visit: Payer: Self-pay | Admitting: Surgery

## 2024-02-24 DIAGNOSIS — M4722 Other spondylosis with radiculopathy, cervical region: Secondary | ICD-10-CM

## 2024-02-26 ENCOUNTER — Encounter: Payer: Self-pay | Admitting: Physician Assistant

## 2024-02-26 ENCOUNTER — Ambulatory Visit: Attending: Physician Assistant | Admitting: Physician Assistant

## 2024-02-26 VITALS — BP 146/74 | HR 69 | Ht 69.0 in | Wt 141.0 lb

## 2024-02-26 DIAGNOSIS — I10 Essential (primary) hypertension: Secondary | ICD-10-CM | POA: Diagnosis not present

## 2024-02-26 DIAGNOSIS — I25118 Atherosclerotic heart disease of native coronary artery with other forms of angina pectoris: Secondary | ICD-10-CM

## 2024-02-26 DIAGNOSIS — E785 Hyperlipidemia, unspecified: Secondary | ICD-10-CM

## 2024-02-26 DIAGNOSIS — I447 Left bundle-branch block, unspecified: Secondary | ICD-10-CM | POA: Diagnosis not present

## 2024-02-26 DIAGNOSIS — I5022 Chronic systolic (congestive) heart failure: Secondary | ICD-10-CM | POA: Diagnosis not present

## 2024-02-26 DIAGNOSIS — I493 Ventricular premature depolarization: Secondary | ICD-10-CM

## 2024-02-26 DIAGNOSIS — I428 Other cardiomyopathies: Secondary | ICD-10-CM

## 2024-02-26 MED ORDER — LOSARTAN POTASSIUM 25 MG PO TABS: 25.0000 mg | ORAL_TABLET | Freq: Every day | ORAL | 3 refills | Status: AC

## 2024-02-26 NOTE — Progress Notes (Signed)
 Cardiology Office Note    Date:  02/26/2024   ID:  ANDREAS SOBOLEWSKI, DOB [birth year], MRN [id number]  PCP:  Rilla Baller, MD  Cardiologist:  Deatrice Cage, MD  Electrophysiologist:  Fonda Kitty, MD   Chief Complaint: Follow up  History of Present Illness:   LEANDRE WIEN is a 78 y.o. male with history of CAD, HFrEF secondary to NICM, frequent PVCs, LBBB, DM2, HTN, and HLD who presents for follow-up of CAD and frequent PVCs.   He underwent cardiac cath in 2009 which showed 1-vessel CAD involving the proximal ramus with 70% stenosis.  He was managed medically.  Follow-up nuclear stress testing in the 12/2013 showed fixed anteroseptal and inferoseptal defects with normal EF.  The defects were felt to be secondary to his left bundle branch block.  He underwent echo in 07/2015 that showed an EF of 50 to 55%, grade 1 diastolic dysfunction, mildly dilated left atrium, mild mitral regurgitation, and mildly elevated PASP at 42 mmHg.  He was seen in 06/2021 with increasing angina.  Echo at that time showed an EF of 50 to 55% with no significant valvular abnormalities.  LHC showed 70% mid LAD stenosis with diffuse 40% disease extending all the way to the ostium, 60% proximal ramus stenosis, and no other obstructive disease.  LVEDP was mildly elevated.  Medical management was recommended with addition of Imdur , though he did not tolerate this medication secondary to headache and dizziness.  Echo in 06/2023 showed an EF of 45 to 50%, hypokinesis of the anterior and anteroseptal region that seemed to be more pronounced than left bundle branch block defect, grade 1 diastolic dysfunction, normal RV systolic function and ventricular cavity size, mild mitral regurgitation, and aortic valve sclerosis without evidence of stenosis.  In this setting, he underwent R/LHC on 08/05/2023 that showed stable two-vessel CAD including ostial/proximal LAD 40% stenosis, mid LAD 70% stenosis, and ramus intermedius is 60%  stenosis.  There was no significant change in coronary anatomy since 2022.  LVEF 35 to 45%.  RHC showed normal right and left-sided filling pressures, normal pulmonary pressure, and normal cardiac output.  Frequent PVCs were noted during the study.  It was suspected his cardiomyopathy was related to PVC burden versus LBBB given significant septal and lateral dyssynchrony noted on echo.  Subsequent Zio patch showed a predominant rhythm of sinus with an average rate of 87 bpm.  Bundle branch block was noted.  Frequent PVCs overall burden of 13.9% along with rare ventricular couplets with a burden of less than 1%.  Ventricular bigeminy and trigeminy were present.    He was seen in the office in 08/2023 and continued to note episodes of chest tightness that would last for 5 to 10 minutes and spontaneously resolved.  He was transitioned from carvedilol  to Toprol -XL 200 mg daily in an effort to suppress PVC burden.  Labs were stable as outlined below.  He was last seen by general cardiology in 09/2023 and continued to feel about the same noting ongoing fatigue and a sensation that he could not get a deep breath in.  He was noted to have frequent PVCs on EKG.  He was evaluated by EP on 10/15/2023 with noted improvement in symptoms following initiation of metoprolol  which was recommended to be continued.  He was initiated on mexiletine 200 mg twice daily.  Repeat Zio patch in 09/2023 showed a predominant rhythm of sinus with an average rate of 85 bpm, 4 episodes of SVT lasting up to  8 beats, occasional PACs with a burden of 2.6%, and rare ventricular ectopy.  He was most recently seen by EP on 11/21/2023 and had inadvertently stopped Toprol  when he initiated mexiletine.  His fatigue and shortness of breath were significantly improved.  It was recommended he restart Toprol -XL at 25 mg daily and continue mexiletine 200 mg twice daily.  He comes in doing well from a cardiac perspective and is without symptoms of angina or cardiac  decompensation.  He notes he was doing well up until about 2 days prior when he began to notice some positional dizziness.  He also notes he is dealing with some cervical spine issues and has an MRI of his cervical spine scheduled for tomorrow.  He is hoping for conservative therapy of his neck discomfort.  No frank syncope or progressive orthopnea.  He does continue to note some sock indentation in his lower extremities if he has been up on his feet for prolonged time frames.  No falls or symptoms concerning for bleeding.  Overall feels improved when compared to his last visit with me in 09/2023.   Labs independently reviewed: 12/2023 - A1c 6.2 08/2023 - magnesium 1.8, TSH normal, Hgb 13.3, PLT 255, BUN 11, serum creatinine 0.69, potassium 4.4 03/2023 - TC 79, TG 69, HDL 32, LDL 33, albumin 4.0, AST/ALT normal  Past Medical History:  Diagnosis Date   Basal cell carcinoma 02/10/2020   SEE MEDIA 8 PAGES OF PREVIOUS BCC   Basal cell carcinoma 08/23/2021   left malar cheek   Basal cell carcinoma 11/14/2022   Right dorsal hand - EDC   Basal cell carcinoma 02/14/2023   left scapula lateral, EDC   Basal cell carcinoma 02/14/2023   left scapula medial, EDC   Basal cell carcinoma 09/05/2023   Left upper back paraspinal superior. Superficial and nodular. EDC   Basal cell carcinoma 09/05/2023   Left upper back paraspinal, inferior. Superficial, nodular. EDC   Basal cell carcinoma 12/04/2023   L lat clavicle  nodular - tx with ED&C   BCC (basal cell carcinoma of skin) 04/25/2023   right top of shoulder tx with ED&C   BCC (basal cell carcinoma) 03/22/2021   sup- left chst (CX35FU)   BPH (benign prostatic hypertrophy)    CAD (coronary artery disease) 2009   [name] (cardiolite 2009 with 70% stenosis of proximal ramus, not amenable to PCI, rec treat medically) --> [name]   Cancer (HCC)    basal cell skin cancer   Cataract    removed bilaterally   DDD (degenerative disc disease), lumbar    s/p  surgery   Diabetes type 2, uncontrolled 1990s   referred to [facility] 09/2015   Diverticulosis    Headache(784.0)    History of adenomatous polyp of colon 10/2012   History of nephrolithiasis 2012   L ureter, R renal pelvis   HLD (hyperlipidemia)    HTN (hypertension)    mild   Hx of migraines    Left bundle branch block    Osteoarthritis of CMC joint of thumb 2014   s/p injections (Sypher)   Osteoarthritis of right shoulder 2015   s/p injections (Sypher)   Osteopenia 06/2011   T score -3, completed 2 yrs forteo , T score improved to -2.2 (2015)   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   in situ (CX35FU)   SCC (squamous cell carcinoma) 08/23/2021   right posterior mandible-CIS   SCC (squamous cell carcinoma) 08/23/2021   right dorsal hand  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   SEE MEDIA 8 PAGES OF PREVIOUS SCC AND TREATMENTS   Vitamin D  deficiency    Wears hearing aid in both ears     Past Surgical History:  Procedure Laterality Date   BACK SURGERY  1994, 2008   lumbar; x 3, spinal fusion   CARDIAC CATHETERIZATION  2009   70% stenosis ostium   CARDIOVASCULAR STRESS TEST  2011   fixed defect inferior/septal walls without reversibility, LVEF 43% post stress   CATARACT EXTRACTION W/PHACO Right 03/09/2019   Procedure: CATARACT EXTRACTION PHACO AND INTRAOCULAR LENS PLACEMENT (IOC) RIGHT DIABETES;  Surgeon: [name], MD;  Location: [facility];  Service: Ophthalmology;  Laterality: Right;  Diabetic - insulin  and oral meds   CATARACT EXTRACTION W/PHACO Left 04/06/2019   Procedure: CATARACT EXTRACTION PHACO AND INTRAOCULAR LENS PLACEMENT (IOC) LEFT DIABETIC 01:04.2     11.5%      8.58;  Surgeon: [name], MD;  Location: [facility];  Service: Ophthalmology;  Laterality: Left;  Diabetic - insulin  and oral meds   COLONOSCOPY  10/2012   tubular adenoma, mild diverticulosis, rpt 5 yrs Verda)   COLONOSCOPY  12/2017   4  polyps - SSP, TA, diverticulosis (Pyrtle)   COLONOSCOPY  07/2021   TA, diverticulosis, rpt 3 yrs vs none Oletta)   dexa  06/2011   osteoporosis T -2.6 spine, -1.9 hip   dexa  07/2013   osteopenia T -2.2 femur, -1.2 spine   FLEXIBLE SIGMOIDOSCOPY  2007   pt reported normal [facility])   FOOT SURGERY Left 08/2017   HAND SURGERY Left 10/2015   CMC arthritis repair Adolphus)   KNEE SURGERY Right    LEFT HEART CATH AND CORONARY ANGIOGRAPHY N/A 06/26/2021   Procedure: LEFT HEART CATH AND CORONARY ANGIOGRAPHY;  Surgeon: [name], MD;  Location: [facility];  Service: Cardiovascular;  Laterality: N/A;   POLYPECTOMY     RIGHT/LEFT HEART CATH AND CORONARY ANGIOGRAPHY Bilateral 08/05/2023   Procedure: RIGHT/LEFT HEART CATH AND CORONARY ANGIOGRAPHY;  Surgeon: [name], MD;  Location: [facility];  Service: Cardiovascular;  Laterality: Bilateral;   SHOULDER SURGERY Right    SKIN SURGERY  2006   skin cancer   TENDON REPAIR  08/2017   TOTAL SHOULDER ARTHROPLASTY Right 01/26/2014   Procedure: TOTAL SHOULDER ARTHROPLASTY;  Surgeon: [name], MD;  Location: [facility];  Service: Orthopedics;  Laterality: Right;   US  ECHOCARDIOGRAPHY  10/2008   nl LV fxn, EF 50%, mild MR, mild pulm HTN    Current Medications: Current Meds  Medication Sig   aspirin  81 MG tablet Take 81 mg by mouth daily.   Cholecalciferol (VITAMIN D3) 25 MCG (1000 UT) CAPS Take 1 capsule (1,000 Units total) by mouth daily.   insulin  glargine (LANTUS ) 100 UNIT/ML injection Inject 0.25 mLs (25 Units total) into the skin daily.   metFORMIN  (GLUCOPHAGE -XR) 500 MG 24 hr tablet Take 2 tablets (1,000 mg total) by mouth daily with breakfast.   metoprolol  succinate (TOPROL  XL) 25 MG 24 hr tablet Take 1 tablet (25 mg total) by mouth daily.   mexiletine (MEXITIL) 200 MG capsule Take 1 capsule (200 mg total) by mouth 2 (two) times daily.   polyethylene glycol (MIRALAX  / GLYCOLAX ) 17 g packet Take 17 g by  mouth daily as needed for moderate constipation.   rosuvastatin  (CRESTOR ) 20 MG tablet TAKE ONE TABLET (20 MG TOTAL) BY MOUTH DAILY. OVERDUE FOLLOW UP  VISIT. PLEASE CALL OFFICE TO SCHEDULE APPOINTMENT PRIOR TO NEXT REFILL   [DISCONTINUED] losartan  (COZAAR ) 50 MG tablet Take 1 tablet (50 mg total) by mouth daily.    Allergies:   Metformin  and related and Percocet [oxycodone -acetaminophen ]   Social History   Socioeconomic History   Marital status: Married    Spouse name: Not on file   Number of children: Not on file   Years of education: Not on file   Highest education level: Not on file  Occupational History   Not on file  Tobacco Use   Smoking status: Former    Current packs/day: 0.00    Average packs/day: 0.3 packs/day for 10.0 years (2.5 ttl pk-yrs)    Types: Cigarettes    Start date: 10/21/1982    Quit date: 10/20/1992    Years since quitting: 31.3   Smokeless tobacco: Former    Types: Chew    Quit date: 10/20/1992  Vaping Use   Vaping status: Never Used  Substance and Sexual Activity   Alcohol use: No    Alcohol/week: 0.0 standard drinks of alcohol   Drug use: No   Sexual activity: Not on file  Other Topics Concern   Not on file  Social History Narrative   Caffeine: 1 cup coffee/day   Lives with wife, 1 dog, 5 cats.   Occupation: retired   Edu: 12th grade   Activity: no regular exercise   Diet: good water , fruits/vegetables daily   Social Drivers of Corporate investment banker Strain: Low Risk  (03/04/2020)   Overall Financial Resource Strain (CARDIA)    Difficulty of Paying Living Expenses: Not hard at all  Food Insecurity: No Food Insecurity (03/04/2020)   Hunger Vital Sign    Worried About Running Out of Food in the Last Year: Never true    Ran Out of Food in the Last Year: Never true  Transportation Needs: Not on file  Physical Activity: Inactive (03/04/2020)   Exercise Vital Sign    Days of Exercise per Week: 0 days    Minutes of Exercise per Session: 0 min   Stress: No Stress Concern Present (03/04/2020)   [employer] of Occupational Health - Occupational Stress Questionnaire    Feeling of Stress : Not at all  Social Connections: Not on file     Family History:  The patient's family history includes Cancer in his father and mother; Coronary artery disease (age of onset: 51) in his sister; Diabetes in his brother and father; Heart attack (age of onset: 76) in his sister; Heart disease in his mother. There is no history of Stroke, Colon cancer, Colon polyps, Esophageal cancer, Rectal cancer, or Stomach cancer.  ROS:   12-point review of systems is negative unless otherwise noted in the HPI.   EKGs/Labs/Other Studies Reviewed:    Studies reviewed were summarized above. The additional studies were reviewed today:  2D echo 07/2015: - Left ventricle: The cavity size was normal. There was mild focal    basal and mild concentric hypertrophy of the septum. Systolic    function was normal. The estimated ejection fraction was in the    range of 50% to 55%. Anteroseptal wall motion hypokinesis noted,    unable to exclude dyskinesis, possibly secondary to bundle branch    block. Doppler parameters are consistent with abnormal left    ventricular relaxation (grade 1 diastolic dysfunction).  - Mitral valve: There was mild regurgitation.  - Left atrium: The atrium was mildly dilated.  -  Right ventricle: Systolic function was normal.  - Pulmonary arteries: Systolic pressure was mildly elevated. PA    peak pressure: 42 mm Hg (S).   Impressions:   - Murmur possibly secondary to proximal septal thickening. No    significant outflow tract gradient. __________   2D echo 06/20/2021: 1. Left ventricular ejection fraction, by estimation, is 50 to 55%. The  left ventricle has low normal function. Left ventricular endocardial  border not optimally defined to evaluate regional wall motion. Left  ventricular diastolic parameters are  consistent with  Grade I diastolic dysfunction (impaired relaxation).  Elevated left atrial pressure.   2. Right ventricular systolic function is normal. The right ventricular  size is normal. Tricuspid regurgitation signal is inadequate for assessing  PA pressure.   3. The mitral valve is grossly normal. Trivial mitral valve  regurgitation.   4. The aortic valve is tricuspid. There is mild calcification of the  aortic valve. There is mild thickening of the aortic valve. Aortic valve  regurgitation is not visualized. Aortic valve sclerosis/calcification is  present, without any evidence of  aortic stenosis.  __________   LHC 06/26/2021:   Mid LAD lesion is 70% stenosed.   Ost LAD to Prox LAD lesion is 40% stenosed.   Ramus lesion is 60% stenosed.   The left ventricular systolic function is normal.   LV end diastolic pressure is mildly elevated.   The left ventricular ejection fraction is 50-55% by visual estimate.   1.  Borderline significant two-vessel coronary artery disease.  Stable proximal ramus stenosis was present on previous cardiac catheterization in 2009.  There is 70% mid LAD stenosis but the proximal LAD to the ostium is moderately diseased as well. 2.  Low normal LV systolic function mildly elevated left ventricular end-diastolic pressure.   Recommendations: Recommend maximizing medical therapy before considering PCI for refractory symptoms.  PCI of the mid LAD can be considered but the proximal and ostial LAD has moderate diffuse disease and might require stenting all the way back to the ostium which is not ideal given the origin of a large ramus branch.  I added Imdur  30 mg daily. __________   2D echo 07/11/2023: 1. Left ventricular ejection fraction, by estimation, is 45 to 50%. The  left ventricle has mildly decreased function. The left ventricle  demonstrates regional wall motion abnormalities (hypokinesis of the  anterior and anteroseptal region). Left  ventricular diastolic  parameters are consistent with Grade I diastolic  dysfunction (impaired relaxation). The average left ventricular global  longitudinal strain is -10.9 %.   2. Right ventricular systolic function is normal. The right ventricular  size is normal. Tricuspid regurgitation signal is inadequate for assessing  PA pressure.   3. The mitral valve is normal in structure. Mild mitral valve  regurgitation. No evidence of mitral stenosis.   4. The aortic valve is normal in structure. There is mild calcification  of the aortic valve. Aortic valve regurgitation is not visualized. Aortic  valve sclerosis is present, with no evidence of aortic valve stenosis.   5. The inferior vena cava is normal in size with greater than 50%  respiratory variability, suggesting right atrial pressure of 3 mmHg.  ___________   [facility] 08/05/2023:   Mid LAD lesion is 70% stenosed.   Ost LAD to Prox LAD lesion is 40% stenosed.   Ramus lesion is 60% stenosed.   There is mild left ventricular systolic dysfunction.   LV end diastolic pressure is moderately elevated.   The  left ventricular ejection fraction is 35-45% by visual estimate.   1.  Stable two-vessel coronary artery disease.  No significant change in coronary anatomy since 2022. 2.  Moderately reduced LV systolic function. 3.  Right heart catheterization showed normal right and left-sided filling pressures, normal pulmonary pressure and normal cardiac output. 4.  Frequent PVCs noted.   Recommendations: Continue medical therapy for coronary artery disease.  I doubt that coronary artery disease is responsible for cardiomyopathy. Suspect that the worsening cardiomyopathy might be PVC induced or left bundle branch block induced given significant septal and lateral dyssynchrony on echo. Will obtain a ZIO monitor to quantify PVC burden and optimize heart failure medications. __________   Zio patch 07/2023: Patient had a min HR of 73 bpm, max HR of 106 bpm, and avg HR of  87 bpm. Predominant underlying rhythm was Sinus Rhythm. Bundle Branch Block/IVCD was present.  Very frequent PVCs with a burden of 14%. __________  Zio patch 09/2023: HR 69 - 162, average 85 bpm. 4 nonsustained SVT (longest 8 beats). No atrial fibrillation detected. Occasional supraventricular ectopy, 2.6%. Rare ventricular ectopy. No sustained arrhythmias. There was 1 symptom trigger episode, which corresponds to sinus rhythm with rare ectopy.   EKG:  EKG is ordered today.  The EKG ordered today demonstrates NSR, 69 bpm, LBBB  Recent Labs: 03/19/2023: ALT 14 08/27/2023: BUN 11; Creatinine, Ser 0.69; Hemoglobin 13.3; Magnesium 1.8; Platelets 255; Potassium 4.4; Sodium 143; TSH 0.623  Recent Lipid Panel    Component Value Date/Time   CHOL 79 03/19/2023 0807   CHOL 83 (L) 09/06/2021 0821   TRIG 69.0 03/19/2023 0807   HDL 32.10 (L) 03/19/2023 0807   HDL 33 (L) 09/06/2021 0821   CHOLHDL 2 03/19/2023 0807   VLDL 13.8 03/19/2023 0807   LDLCALC 33 03/19/2023 0807   LDLCALC 35 09/06/2021 0821   LDLDIRECT 65.0 01/23/2016 0931    PHYSICAL EXAM:    VS:  BP (!) 146/74   Pulse 69   Ht 5' 9 (1.753 m)   Wt 141 lb (64 kg)   SpO2 99%   BMI 20.82 kg/m   BMI: Body mass index is 20.82 kg/m.  Physical Exam Vitals reviewed.  Constitutional:      Appearance: He is well-developed.  HENT:     Head: Normocephalic and atraumatic.  Eyes:     General:        Right eye: No discharge.        Left eye: No discharge.  Cardiovascular:     Rate and Rhythm: Normal rate and regular rhythm.     Heart sounds: Normal heart sounds, S1 normal and S2 normal. Heart sounds not distant. No midsystolic click and no opening snap. No murmur heard.    No friction rub.  Pulmonary:     Effort: Pulmonary effort is normal. No respiratory distress.     Breath sounds: Normal breath sounds. No decreased breath sounds, wheezing, rhonchi or rales.  Musculoskeletal:     Cervical back: Normal range of motion.      Right lower leg: No edema.     Left lower leg: No edema.  Skin:    General: Skin is warm and dry.     Nails: There is no clubbing.  Neurological:     Mental Status: He is alert and oriented to person, place, and time.  Psychiatric:        Speech: Speech normal.        Behavior: Behavior normal.  Thought Content: Thought content normal.        Judgment: Judgment normal.     Wt Readings from Last 3 Encounters:  02/26/24 141 lb (64 kg)  12/24/23 140 lb (63.5 kg)  11/21/23 138 lb 3.2 oz (62.7 kg)     ASSESSMENT & PLAN:   HFrEF secondary to NICM with LBBB: Appears euvolemic and well compensated with NYHA class II symptoms.  Recent LHC showed moderate CAD with medical management as outlined above.  Query if his progressive cardiomyopathy was in the setting of PVC burden.  Now on Toprol -XL in place of carvedilol  to assist with PVC burden suppression.  We will reduce losartan  to 25 mg daily with positional dizziness.  Not requiring a standing loop diuretic.  With improvement in PVC burden we will schedule a limited echo in 1 month to reevaluate LV systolic function.  CAD involving the native coronary arteries with stable angina: Recent LHC showed stable CAD with medical therapy recommended as outlined above.  Continue aggressive risk factor modification and current pharmacotherapy including aspirin  81 mg, Toprol -XL 25 mg, and rosuvastatin  20 mg.  No indication for further ischemic testing at this time.  Frequent PVCs: Burden much improved on Toprol -XL 25 mg and mexiletine 200 mg twice daily.  Followed by EP.  HTN: Blood pressure is mildly elevated in the office though well-controlled typically.  With positional dizziness noted we will reduce losartan  as outlined above.  He will continue current dose of Toprol -XL.  HLD: LDL 33 in 03/2023 with normal AST/ALT at that time.  Remains on rosuvastatin  20 mg.    Disposition: F/u with Dr. Darron or an APP in 2 months, and EP as directed.     Medication Adjustments/Labs and Tests Ordered: Current medicines are reviewed at length with the patient today.  Concerns regarding medicines are outlined above. Medication changes, Labs and Tests ordered today are summarized above and listed in the Patient Instructions accessible in Encounters.   Signed, Bernardino Bring, PA-C 02/26/2024 5:35 PM     [facility] [place] [address] [place], [place] [address] [phone]

## 2024-02-26 NOTE — Patient Instructions (Signed)
 Medication Instructions:  Your physician recommends the following medication changes.  DECREASE: Losartan  25 mg daily  *If you need a refill on your cardiac medications before your next appointment, please call your pharmacy*  Lab Work: None ordered at this time   Testing/Procedures: Your physician has requested that you have an LIMITED echocardiogram in 1 month. Echocardiography is a painless test that uses sound waves to create images of your heart. It provides your doctor with information about the size and shape of your heart and how well your heart's chambers and valves are working.   You may receive an ultrasound enhancing agent through an IV if needed to better visualize your heart during the echo. This procedure takes approximately one hour.  There are no restrictions for this procedure.  This will take place at [address] [facility], [place] [address]  Please note: We ask at that you not bring children with you during ultrasound (echo/ vascular) testing. Due to room size and safety concerns, children are not allowed in the ultrasound rooms during exams. Our front office staff cannot provide observation of children in our lobby area while testing is being conducted. An adult accompanying a patient to their appointment will only be allowed in the ultrasound room at the discretion of the ultrasound technician under special circumstances. We apologize for any inconvenience.  Follow-Up: At [facility], you and your health needs are our priority.  As part of our continuing mission to provide you with exceptional heart care, our providers are all part of one team.  This team includes your primary Cardiologist (physician) and Advanced Practice Providers or APPs (Physician Assistants and Nurse Practitioners) who all work together to provide you with the care you need, when you need it.  Your next appointment:   2 month(s)  Provider:   You may see  Deatrice Cage, MD or Bernardino Bring, PA-C  We recommend signing up for the patient portal called MyChart.  Sign up information is provided on this After Visit Summary.  MyChart is used to connect with patients for Virtual Visits (Telemedicine).  Patients are able to view lab/test results, encounter notes, upcoming appointments, etc.  Non-urgent messages can be sent to your provider as well.   To learn more about what you can do with MyChart, go to [email or web address].

## 2024-02-27 ENCOUNTER — Ambulatory Visit
Admission: RE | Admit: 2024-02-27 | Discharge: 2024-02-27 | Disposition: A | Discharge status: Home / Self Care | Source: Ambulatory Visit | Attending: Surgery | Admitting: Surgery

## 2024-02-27 DIAGNOSIS — M4722 Other spondylosis with radiculopathy, cervical region: Secondary | ICD-10-CM

## 2024-03-17 ENCOUNTER — Ambulatory Visit

## 2024-03-21 ENCOUNTER — Other Ambulatory Visit: Payer: Self-pay | Admitting: Family Medicine

## 2024-03-21 DIAGNOSIS — E559 Vitamin D deficiency, unspecified: Secondary | ICD-10-CM

## 2024-03-21 DIAGNOSIS — E1169 Type 2 diabetes mellitus with other specified complication: Secondary | ICD-10-CM

## 2024-03-21 DIAGNOSIS — M85852 Other specified disorders of bone density and structure, left thigh: Secondary | ICD-10-CM

## 2024-03-21 DIAGNOSIS — N138 Other obstructive and reflux uropathy: Secondary | ICD-10-CM

## 2024-03-27 ENCOUNTER — Other Ambulatory Visit (INDEPENDENT_AMBULATORY_CARE_PROVIDER_SITE_OTHER)

## 2024-03-27 DIAGNOSIS — N401 Enlarged prostate with lower urinary tract symptoms: Secondary | ICD-10-CM | POA: Diagnosis not present

## 2024-03-27 DIAGNOSIS — N138 Other obstructive and reflux uropathy: Secondary | ICD-10-CM

## 2024-03-27 DIAGNOSIS — E1169 Type 2 diabetes mellitus with other specified complication: Secondary | ICD-10-CM

## 2024-03-27 DIAGNOSIS — E559 Vitamin D deficiency, unspecified: Secondary | ICD-10-CM | POA: Diagnosis not present

## 2024-03-27 DIAGNOSIS — E785 Hyperlipidemia, unspecified: Secondary | ICD-10-CM

## 2024-03-27 DIAGNOSIS — Z794 Long term (current) use of insulin: Secondary | ICD-10-CM

## 2024-03-27 DIAGNOSIS — M85852 Other specified disorders of bone density and structure, left thigh: Secondary | ICD-10-CM

## 2024-03-27 LAB — COMPREHENSIVE METABOLIC PANEL WITH GFR
ALT: 24 U/L (ref 0–53)
AST: 26 U/L (ref 0–37)
Albumin: 4.2 g/dL (ref 3.5–5.2)
Alkaline Phosphatase: 72 U/L (ref 39–117)
BUN: 9 mg/dL (ref 6–23)
CO2: 32 meq/L (ref 19–32)
Calcium: 9.1 mg/dL (ref 8.4–10.5)
Chloride: 106 meq/L (ref 96–112)
Creatinine, Ser: 0.65 mg/dL (ref 0.40–1.50)
GFR: 89.82 mL/min (ref >60.00)
Glucose, Bld: 135 mg/dL — ABNORMAL HIGH (ref 70–99)
Potassium: 3.8 meq/L (ref 3.5–5.1)
Sodium: 143 meq/L (ref 135–145)
Total Bilirubin: 0.7 mg/dL (ref 0.2–1.2)
Total Protein: 6.1 g/dL (ref 6.0–8.3)

## 2024-03-27 LAB — LIPID PANEL
Cholesterol: 95 mg/dL (ref 0–200)
HDL: 44.8 mg/dL (ref >39.00)
LDL Cholesterol: 37 mg/dL (ref 0–99)
NonHDL: 49.9
Total CHOL/HDL Ratio: 2
Triglycerides: 67 mg/dL (ref 0.0–149.0)
VLDL: 13.4 mg/dL (ref 0.0–40.0)

## 2024-03-27 LAB — PSA: PSA: 1.97 ng/mL (ref 0.10–4.00)

## 2024-03-27 LAB — HEMOGLOBIN A1C: Hgb A1c MFr Bld: 7.6 % — ABNORMAL HIGH (ref 4.6–6.5)

## 2024-03-27 LAB — MICROALBUMIN / CREATININE URINE RATIO
Creatinine,U: 99.1 mg/dL
Microalb Creat Ratio: 18.9 mg/g (ref 0.0–30.0)
Microalb, Ur: 1.9 mg/dL (ref 0.0–1.9)

## 2024-03-27 LAB — VITAMIN B12: Vitamin B-12: 122 pg/mL — ABNORMAL LOW (ref 211–911)

## 2024-03-27 LAB — VITAMIN D 25 HYDROXY (VIT D DEFICIENCY, FRACTURES): VITD: 46.15 ng/mL (ref 30.00–100.00)

## 2024-03-30 ENCOUNTER — Ambulatory Visit: Payer: Self-pay | Admitting: Family Medicine

## 2024-03-31 ENCOUNTER — Encounter: Payer: Self-pay | Admitting: Family Medicine

## 2024-03-31 ENCOUNTER — Ambulatory Visit: Admitting: Family Medicine

## 2024-03-31 VITALS — BP 130/78 | HR 69 | Temp 97.8°F | Ht 67.0 in | Wt 144.0 lb

## 2024-03-31 DIAGNOSIS — I447 Left bundle-branch block, unspecified: Secondary | ICD-10-CM

## 2024-03-31 DIAGNOSIS — E785 Hyperlipidemia, unspecified: Secondary | ICD-10-CM

## 2024-03-31 DIAGNOSIS — Z794 Long term (current) use of insulin: Secondary | ICD-10-CM

## 2024-03-31 DIAGNOSIS — Z0000 Encounter for general adult medical examination without abnormal findings: Secondary | ICD-10-CM

## 2024-03-31 DIAGNOSIS — I5022 Chronic systolic (congestive) heart failure: Secondary | ICD-10-CM

## 2024-03-31 DIAGNOSIS — E1169 Type 2 diabetes mellitus with other specified complication: Secondary | ICD-10-CM

## 2024-03-31 DIAGNOSIS — I10 Essential (primary) hypertension: Secondary | ICD-10-CM

## 2024-03-31 DIAGNOSIS — Z23 Encounter for immunization: Secondary | ICD-10-CM

## 2024-03-31 DIAGNOSIS — N138 Other obstructive and reflux uropathy: Secondary | ICD-10-CM

## 2024-03-31 DIAGNOSIS — Z8673 Personal history of transient ischemic attack (TIA), and cerebral infarction without residual deficits: Secondary | ICD-10-CM

## 2024-03-31 DIAGNOSIS — I25118 Atherosclerotic heart disease of native coronary artery with other forms of angina pectoris: Secondary | ICD-10-CM

## 2024-03-31 DIAGNOSIS — E538 Deficiency of other specified B group vitamins: Secondary | ICD-10-CM | POA: Diagnosis not present

## 2024-03-31 DIAGNOSIS — Z7189 Other specified counseling: Secondary | ICD-10-CM

## 2024-03-31 DIAGNOSIS — E559 Vitamin D deficiency, unspecified: Secondary | ICD-10-CM

## 2024-03-31 DIAGNOSIS — I6502 Occlusion and stenosis of left vertebral artery: Secondary | ICD-10-CM

## 2024-03-31 DIAGNOSIS — I493 Ventricular premature depolarization: Secondary | ICD-10-CM

## 2024-03-31 DIAGNOSIS — N401 Enlarged prostate with lower urinary tract symptoms: Secondary | ICD-10-CM

## 2024-03-31 DIAGNOSIS — M85852 Other specified disorders of bone density and structure, left thigh: Secondary | ICD-10-CM

## 2024-03-31 MED ORDER — METFORMIN HCL ER 500 MG PO TB24: ORAL_TABLET | ORAL | 3 refills | Status: AC

## 2024-03-31 MED ORDER — NOVOFINE PLUS PEN NEEDLE 32G X 4 MM MISC: 3 refills | Status: AC

## 2024-03-31 MED ORDER — INSULIN GLARGINE 100 UNIT/ML ~~LOC~~ SOLN: 25.0000 [IU] | Freq: Every day | SUBCUTANEOUS | 2 refills | Status: AC

## 2024-03-31 MED ORDER — CYANOCOBALAMIN 1000 MCG/ML IJ SOLN
1000.0000 ug | Freq: Once | INTRAMUSCULAR | Status: AC
  Administered 2024-03-31: 1000 ug via INTRAMUSCULAR

## 2024-03-31 MED ORDER — INSULIN SYRINGE-NEEDLE U-100 31G X 5/16" 1 ML MISC: 3 refills | Status: AC

## 2024-03-31 MED ORDER — ONETOUCH ULTRA VI STRP: ORAL_STRIP | 3 refills | Status: AC

## 2024-03-31 MED ORDER — METFORMIN HCL ER 500 MG PO TB24
1000.0000 mg | ORAL_TABLET | Freq: Every day | ORAL | 3 refills | Status: DC
Start: 2024-03-31 — End: 2024-03-31

## 2024-03-31 MED ORDER — VITAMIN B-12 1000 MCG PO TABS: 1000.0000 ug | ORAL_TABLET | Freq: Every day | ORAL | Status: AC

## 2024-03-31 NOTE — Patient Instructions (Addendum)
 Prevnar-20 today  Vitamin B12 shot today then start monthly for the next 6 months. Also take vitamin b12 1000mcg daily over the counter Send us  dates of shingles shots to update your chart.  Continue working on advanced directive.  Continue other medicines.  Increase metformin  XR to 2 tablets in am and 1 with supper.  Return in 4 months for diabetes follow up visit.  Good to see you today

## 2024-03-31 NOTE — Assessment & Plan Note (Signed)
 In setting of metformin  use. Will provide with B12 shot IM today then monthly x6 months.  Discussed oral B12 replacement as well.

## 2024-03-31 NOTE — Assessment & Plan Note (Signed)
 Preventative protocols reviewed and updated unless pt declined. Discussed healthy diet and lifestyle.

## 2024-03-31 NOTE — Assessment & Plan Note (Signed)
 Chronic, deteriorated since stopping ozempic . Will continue lantus  daily. Will increase metformin  XR to 1000/500mg  daily.  RTC 4 mo DM f/u visit

## 2024-03-31 NOTE — Progress Notes (Unsigned)
 Ph: [phone] Fax: [phone]   Patient ID: MONTRAIL MEHRER, male    DOB: 1946/05/03, 78 y.o.   MRN: [id number]  This visit was conducted in person.  BP 130/78   Pulse 69   Temp 97.8 F (36.6 C) (Oral)   Ht 5' 7 (1.702 m)   Wt 144 lb (65.3 kg)   SpO2 99%   BMI 22.55 kg/m    CC: CPE/AMW Subjective:   HPI: JERMON CHALFANT is a 78 y.o. male presenting on 03/31/2024 for Annual Exam   Did not see health advisor this year.  No results found.  Flowsheet Row Office Visit from 12/24/2023 in [facility]  PHQ-2 Total Score 0       12/24/2023    8:30 AM 03/26/2023    9:32 AM 09/19/2022    9:57 AM 03/21/2022   10:02 AM 03/17/2021    9:48 AM  Fall Risk   Falls in the past year? 0 0 0 0 0   Known CAD, LBBB with CHF and niCM, HTN, HLD. Sees cardiology.   Incidental stroke 06/2020 - continues aspirin , statin. 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    DM - ozempic  stopped in June due to ongoing anorexia, weight loss. 4 lb weight gain since then. Continues metformin  and lantus . A1c trending up - see below.  CGM data: FL3+ 30 day average sugar: 154, time in range 67%, hypoglycemia 1%, hyperglycemia 32%.   Last visit we transitioned from ramipril  to losartan  due to persistent dry cough - this has improved.   Preventative: COLONOSCOPY 12/2017 - 4 polyps - SSP, TA, diverticulosis, rpt 3 yrs (Pyrtle) Colonoscopy 07/2021 - 1 TA, diverticulosis, no f/u needed (Perry/Pyrtle).  Prostate cancer screening - aged out. BPH with nocturia x4-5, flomax  didn't help, finasteride  tried as well.  Lung cancer screening - not eligible, quit >15 yrs ago DEXA 04/2017 osteopenia T score -2.1 hip, -1.9 spine  DEXA 03/2022 - T -2.1 LFN, -1.2 spine, at increased risk of hip fracture  Flu yearly COVID vaccine - Moderna 09/2019, 10/2019, booster 05/2020  Penumovax 06/28/2010. Prevnar-13 09/2013, Prevnar-20 today Tdap  2021 Zostavax - 01/2013  Shingrix - completed - will send us  dates  Advanced directive: doesn't have one set up. Wife would be HCPOA. Does not want drastic measures. Has paperwork at home. Asked to bring us  copy when completed.  Seat belt use discussed Sunscreen use discussed. No changing moles on skin. Sees derm regularly [place]) Sleep - averages 7-8 hours.  Ex smoker - quit 1994  Alcohol - none  Dentist q6 mo  Eye exam yearly  Bowels - constipation managed with miralax  1 capful daily prn  Bladder - no incontinence, some urinary urgency   Caffeine: 1 cup coffee/day   Lives with wife, 1 dog, 5 cats.   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  Diet: good water , fruits/vegetables daily      Relevant past medical, surgical, family and social history reviewed and updated as indicated. Interim medical history since our last visit reviewed. Allergies and medications reviewed and updated. Outpatient Medications Prior to Visit  Medication Sig Dispense Refill   aspirin  81 MG tablet Take 81 mg by mouth daily.     Cholecalciferol (VITAMIN D3) 25 MCG (1000 UT) CAPS Take 1 capsule (1,000 Units total) by mouth daily. 30 capsule    losartan  (  COZAAR ) 25 MG tablet Take 1 tablet (25 mg total) by mouth daily. 90 tablet 3   metoprolol  succinate (TOPROL  XL) 25 MG 24 hr tablet Take 1 tablet (25 mg total) by mouth daily. 90 tablet 3   mexiletine (MEXITIL) 200 MG capsule Take 1 capsule (200 mg total) by mouth 2 (two) times daily. 180 capsule 3   polyethylene glycol (MIRALAX  / GLYCOLAX ) 17 g packet Take 17 g by mouth daily as needed for moderate constipation.     rosuvastatin  (CRESTOR ) 20 MG tablet TAKE ONE TABLET (20 MG TOTAL) BY MOUTH DAILY. OVERDUE FOLLOW UP VISIT. PLEASE CALL OFFICE TO SCHEDULE APPOINTMENT PRIOR TO NEXT REFILL 90 tablet 1   insulin  glargine (LANTUS ) 100 UNIT/ML injection Inject 0.25 mLs (25 Units total) into the skin daily. 30 mL  2   Insulin  Pen Needle (NOVOFINE PLUS PEN NEEDLE) 32G X 4 MM MISC Use to administer Ozempic  once a week. 30 each 0   metFORMIN  (GLUCOPHAGE -XR) 500 MG 24 hr tablet Take 2 tablets (1,000 mg total) by mouth daily with breakfast. 180 tablet 3   ONETOUCH ULTRA test strip CHECK BLOOD SUGAR ONCE OR TWICE DAILY ASDIRECTED 100 each 3   TRUEPLUS INSULIN  SYRINGE 31G X 5/16 1 ML MISC USE TO INJECT INSULIN  AS DIRECTED 100 each 4   No facility-administered medications prior to visit.     Per HPI unless specifically indicated in ROS section below Review of Systems  Constitutional:  Negative for activity change, appetite change, chills, fatigue, fever and unexpected weight change.  HENT:  Negative for hearing loss.   Eyes:  Negative for visual disturbance.  Respiratory:  Negative for cough, chest tightness, shortness of breath and wheezing.   Cardiovascular:  Positive for leg swelling (mild dependent). Negative for chest pain and palpitations.  Gastrointestinal:  Negative for abdominal distention, abdominal pain, blood in stool, constipation, diarrhea, nausea and vomiting.  Genitourinary:  Negative for difficulty urinating and hematuria.  Musculoskeletal:  Negative for arthralgias, myalgias and neck pain.  Skin:  Negative for rash.  Neurological:  Positive for headaches (occ). Negative for dizziness, seizures and syncope.  Hematological:  Negative for adenopathy. Bruises/bleeds easily.  Psychiatric/Behavioral:  Negative for dysphoric mood. The patient is not nervous/anxious.     Objective:  BP 130/78   Pulse 69   Temp 97.8 F (36.6 C) (Oral)   Ht 5' 7 (1.702 m)   Wt 144 lb (65.3 kg)   SpO2 99%   BMI 22.55 kg/m   Wt Readings from Last 3 Encounters:  03/31/24 144 lb (65.3 kg)  02/26/24 141 lb (64 kg)  12/24/23 140 lb (63.5 kg)      Physical Exam Vitals and nursing note reviewed.  Constitutional:      General: He is not in acute distress.    Appearance: Normal appearance. He is  well-developed. He is not ill-appearing.  HENT:     Head: Normocephalic and atraumatic.     Right Ear: Hearing, tympanic membrane, ear canal and external ear normal.     Left Ear: Hearing, tympanic membrane, ear canal and external ear normal.     Mouth/Throat:     Mouth: Mucous membranes are moist.     Pharynx: Oropharynx is clear. No oropharyngeal exudate or posterior oropharyngeal erythema.  Eyes:     General: No scleral icterus.    Extraocular Movements: Extraocular movements intact.     Conjunctiva/sclera: Conjunctivae normal.     Pupils: Pupils are equal, round, and reactive to light.  Neck:  Thyroid : No thyroid  mass or thyromegaly.     Vascular: No carotid bruit.  Cardiovascular:     Rate and Rhythm: Normal rate and regular rhythm.     Pulses: Normal pulses.          Radial pulses are 2+ on the right side and 2+ on the left side.     Heart sounds: Normal heart sounds. No murmur heard. Pulmonary:     Effort: Pulmonary effort is normal. No respiratory distress.     Breath sounds: Normal breath sounds. No wheezing, rhonchi or rales.  Abdominal:     General: Bowel sounds are normal. There is no distension.     Palpations: Abdomen is soft. There is no mass.     Tenderness: There is no abdominal tenderness. There is no guarding or rebound.     Hernia: No hernia is present.  Musculoskeletal:        General: Normal range of motion.     Cervical back: Normal range of motion and neck supple.     Right lower leg: No edema.     Left lower leg: No edema.  Lymphadenopathy:     Cervical: No cervical adenopathy.  Skin:    General: Skin is warm and dry.     Findings: No rash.  Neurological:     General: No focal deficit present.     Mental Status: He is alert and oriented to person, place, and time.     Comments:  Recall 3/3 Calculation 2/5 DORW, 5/5 serial 3s  Psychiatric:        Mood and Affect: Mood normal.        Behavior: Behavior normal.        Thought Content: Thought  content normal.        Judgment: Judgment normal.       Results for orders placed or performed in visit on 03/27/24  Vitamin B12   Collection Time: 03/27/24  8:01 AM  Result Value Ref Range   Vitamin B-12 122 (L) 211 - 911 pg/mL  VITAMIN D  25 Hydroxy (Vit-D Deficiency, Fractures)   Collection Time: 03/27/24  8:01 AM  Result Value Ref Range   VITD 46.15 30.00 - 100.00 ng/mL  PSA   Collection Time: 03/27/24  8:01 AM  Result Value Ref Range   PSA 1.97 0.10 - 4.00 ng/mL  Comprehensive metabolic panel with GFR   Collection Time: 03/27/24  8:01 AM  Result Value Ref Range   Sodium 143 135 - 145 mEq/L   Potassium 3.8 3.5 - 5.1 mEq/L   Chloride 106 96 - 112 mEq/L   CO2 32 19 - 32 mEq/L   Glucose, Bld 135 (H) 70 - 99 mg/dL   BUN 9 6 - 23 mg/dL   Creatinine, Ser 9.34 0.40 - 1.50 mg/dL   Total Bilirubin 0.7 0.2 - 1.2 mg/dL   Alkaline Phosphatase 72 39 - 117 U/L   AST 26 0 - 37 U/L   ALT 24 0 - 53 U/L   Total Protein 6.1 6.0 - 8.3 g/dL   Albumin 4.2 3.5 - 5.2 g/dL   GFR 10.17 >39.99 mL/min   Calcium  9.1 8.4 - 10.5 mg/dL  Lipid panel   Collection Time: 03/27/24  8:01 AM  Result Value Ref Range   Cholesterol 95 0 - 200 mg/dL   Triglycerides 32.9 0.0 - 149.0 mg/dL   HDL 55.19 >60.99 mg/dL   VLDL 86.5 0.0 - 59.9 mg/dL   LDL Cholesterol 37 0 -  99 mg/dL   Total CHOL/HDL Ratio 2    NonHDL 49.90   Microalbumin / creatinine urine ratio   Collection Time: 03/27/24  8:01 AM  Result Value Ref Range   Microalb, Ur 1.9 0.0 - 1.9 mg/dL   Creatinine,U 00.8 mg/dL   Microalb Creat Ratio 18.9 0.0 - 30.0 mg/g  Hemoglobin A1c   Collection Time: 03/27/24  8:01 AM  Result Value Ref Range   Hgb A1c MFr Bld 7.6 (H) 4.6 - 6.5 %   Lab Results  Component Value Date   WBC 7.3 08/27/2023   HGB 13.3 08/27/2023   HCT 40.1 08/27/2023   MCV 94 08/27/2023   PLT 255 08/27/2023    Lab Results  Component Value Date   TSH 0.623 08/27/2023    Assessment & Plan:   Problem List Items Addressed This  Visit     Medicare annual wellness visit, subsequent - Primary (Chronic)   I have personally reviewed the Medicare Annual Wellness questionnaire and have noted 1. The patient's medical and social history 2. Their use of alcohol, tobacco or illicit drugs 3. Their current medications and supplements 4. The patient's functional ability including ADL's, fall risks, home safety risks and hearing or visual impairment. Cognitive function has been assessed and addressed as indicated.  5. Diet and physical activity 6. Evidence for depression or mood disorders The patients weight, height, BMI have been recorded in the chart. I have made referrals, counseling and provided education to the patient based on review of the above and I have provided the pt with a written personalized care plan for preventive services. Provider list updated.. See scanned questionairre as needed for further documentation. Reviewed preventative protocols and updated unless pt declined.       Advanced care planning/counseling discussion (Chronic)   Working on this. Asked to bring us  a copy.       Health maintenance examination (Chronic)   Preventative protocols reviewed and updated unless pt declined. Discussed healthy diet and lifestyle.       Type 2 diabetes mellitus with other specified complication (HCC)   Chronic, deteriorated since stopping ozempic . Will continue lantus  daily. Will increase metformin  XR to 1000/500mg  daily.  RTC 4 mo DM f/u visit       Relevant Medications   insulin  glargine (LANTUS ) 100 UNIT/ML injection   glucose blood (ONETOUCH ULTRA) test strip   Insulin  Syringe-Needle U-100 (TRUEPLUS INSULIN  SYRINGE) 31G X 5/16 1 ML MISC   metFORMIN  (GLUCOPHAGE -XR) 500 MG 24 hr tablet   BPH with obstruction/lower urinary tract symptoms   Chronic, overall stable off medication.       HTN (hypertension)   Chronic, stable on current regimen - continue.       Hyperlipidemia associated with type 2  diabetes mellitus (HCC)   Chronic, stable on crestor  20mg  daily - continue. The ASCVD Risk score (Arnett DK, et al., 2019) failed to calculate for the following reasons:   The valid total cholesterol range is 130 to 320 mg/dL       Relevant Medications   insulin  glargine (LANTUS ) 100 UNIT/ML injection   metFORMIN  (GLUCOPHAGE -XR) 500 MG 24 hr tablet   CAD (coronary artery disease)   Osteopenia   Continue dietary calcium  intake, vit D supplement, regular walking.  Consider updated DEXA later this year.       Vitamin D  deficiency   Continue vitamin D  1000 units daily.       Left bundle branch block   Vertebral artery stenosis  Continue apirin, statin.      History of CVA in adulthood   Continue aspirin , statin.       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 Will provide with B12 shot IM today then monthly x6 months.  Discussed oral B12 replacement as well.       Frequent PVCs   Sees EP managing with mexiletine and toprol  XL      Other Visit Diagnoses       Need for vaccination against Streptococcus pneumoniae       Relevant Orders   Pneumococcal conjugate vaccine 20-valent (Prevnar 20) (Completed)        Meds ordered this encounter  Medications   insulin  glargine (LANTUS ) 100 UNIT/ML injection    Sig: Inject 0.25 mLs (25 Units total) into the skin daily.    Dispense:  30 mL    Refill:  2   Insulin  Pen Needle (NOVOFINE PLUS PEN NEEDLE) 32G X 4 MM MISC    Sig: Use to administer Ozempic  once a week.    Dispense:  100 each    Refill:  3   DISCONTD: metFORMIN  (GLUCOPHAGE -XR) 500 MG 24 hr tablet    Sig: Take 2 tablets (1,000 mg total) by mouth daily with breakfast.    Dispense:  180 tablet    Refill:  3   glucose blood (ONETOUCH ULTRA) test strip    Sig: Use as instructed    Dispense:  100 each    Refill:  3    Diagnosis E11.8    Insulin  Syringe-Needle U-100 (TRUEPLUS INSULIN  SYRINGE) 31G X 5/16 1 ML MISC    Sig: Use as directed to inject insulin  daily    Dispense:  100 each    Refill:  3   metFORMIN  (GLUCOPHAGE -XR) 500 MG 24 hr tablet    Sig: Take 2 tablets (1,000 mg total) by mouth daily with breakfast AND 1 tablet (500 mg total) daily with supper.    Dispense:  270 tablet    Refill:  3    Note new dose   cyanocobalamin  (VITAMIN B12) 1000 MCG tablet    Sig: Take 1 tablet (1,000 mcg total) by mouth daily.   cyanocobalamin  (VITAMIN B12) injection 1,000 mcg    Orders Placed This Encounter  Procedures   Pneumococcal conjugate vaccine 20-valent (Prevnar 20)    Patient Instructions  Prevnar-20 today  Vitamin B12 shot today then start monthly for the next 6 months. Also take vitamin b12 1000mcg daily over the counter Send us  dates of shingles shots to update your chart.  Continue working on advanced directive.  Continue other medicines.  Increase metformin  XR to 2 tablets in am and 1 with supper.  Return in 4 months for diabetes follow up visit.  Good to see you today   Follow up plan: Return in about 4 months (around 07/31/2024) for follow up visit.  Anton Blas, MD

## 2024-03-31 NOTE — Assessment & Plan Note (Signed)
Working on this. Asked to bring [place] a copy

## 2024-03-31 NOTE — Assessment & Plan Note (Signed)

## 2024-04-01 ENCOUNTER — Other Ambulatory Visit: Payer: Self-pay | Admitting: Family Medicine

## 2024-04-01 DIAGNOSIS — I493 Ventricular premature depolarization: Secondary | ICD-10-CM | POA: Insufficient documentation

## 2024-04-01 NOTE — Assessment & Plan Note (Signed)
 Continue aspirin, statin.

## 2024-04-01 NOTE — Assessment & Plan Note (Signed)
 Continue dietary calcium  intake, vit D supplement, regular walking.  Consider updated DEXA later this year.

## 2024-04-01 NOTE — Assessment & Plan Note (Signed)
 Continue vitamin D 1000 units daily.

## 2024-04-01 NOTE — Assessment & Plan Note (Signed)
 Chronic, overall stable off medication.

## 2024-04-01 NOTE — Assessment & Plan Note (Signed)
Chronic, stable on crestor '20mg'$  daily - continue. ?The ASCVD Risk score (Arnett DK, et al., 2019) failed to calculate for the following reasons: ?  The valid total cholesterol range is 130 to 320 mg/dL  ?

## 2024-04-01 NOTE — Assessment & Plan Note (Signed)
Appreciate cardiology care.  °

## 2024-04-01 NOTE — Addendum Note (Signed)
 Addended by: SEBASTIAN DANNA GRADE on: 04/01/2024 04:16 PM   Modules accepted: Orders

## 2024-04-01 NOTE — Assessment & Plan Note (Signed)
 Continue apirin, statin.

## 2024-04-01 NOTE — Telephone Encounter (Signed)
 Copied from CRM [id number]. Topic: Clinical - Prescription Issue >> Apr 01, 2024  2:48 PM [place] wrote: Reason for CRM: [facility] needing to get changed Accu-check guide meter and Accu-check soft click Lancets qty 100

## 2024-04-01 NOTE — Assessment & Plan Note (Signed)
 Chronic, stable on current regimen - continue.

## 2024-04-01 NOTE — Assessment & Plan Note (Signed)
 Sees EP managing with mexiletine and toprol  XL

## 2024-04-06 MED ORDER — ACCU-CHEK GUIDE ME W/DEVICE KIT: 1.0000 | PACK | Freq: Three times a day (TID) | 0 refills | Status: AC

## 2024-04-09 ENCOUNTER — Ambulatory Visit: Attending: Physician Assistant

## 2024-04-09 DIAGNOSIS — I5022 Chronic systolic (congestive) heart failure: Secondary | ICD-10-CM | POA: Diagnosis not present

## 2024-04-09 DIAGNOSIS — I428 Other cardiomyopathies: Secondary | ICD-10-CM

## 2024-04-09 DIAGNOSIS — I493 Ventricular premature depolarization: Secondary | ICD-10-CM | POA: Diagnosis not present

## 2024-04-09 LAB — ECHOCARDIOGRAM LIMITED
Area-P 1/2: 3.17 cm2
S' Lateral: 3.25 cm

## 2024-04-10 ENCOUNTER — Ambulatory Visit: Payer: Self-pay | Admitting: Physician Assistant

## 2024-04-27 ENCOUNTER — Encounter: Payer: Self-pay | Admitting: Physician Assistant

## 2024-04-27 ENCOUNTER — Ambulatory Visit: Attending: Physician Assistant | Admitting: Physician Assistant

## 2024-04-27 VITALS — BP 122/60 | HR 71 | Ht 67.0 in | Wt 142.4 lb

## 2024-04-27 DIAGNOSIS — I428 Other cardiomyopathies: Secondary | ICD-10-CM | POA: Diagnosis not present

## 2024-04-27 DIAGNOSIS — I493 Ventricular premature depolarization: Secondary | ICD-10-CM

## 2024-04-27 DIAGNOSIS — I5020 Unspecified systolic (congestive) heart failure: Secondary | ICD-10-CM

## 2024-04-27 DIAGNOSIS — I25118 Atherosclerotic heart disease of native coronary artery with other forms of angina pectoris: Secondary | ICD-10-CM

## 2024-04-27 DIAGNOSIS — I447 Left bundle-branch block, unspecified: Secondary | ICD-10-CM

## 2024-04-27 DIAGNOSIS — E785 Hyperlipidemia, unspecified: Secondary | ICD-10-CM

## 2024-04-27 DIAGNOSIS — R2681 Unsteadiness on feet: Secondary | ICD-10-CM

## 2024-04-27 DIAGNOSIS — I10 Essential (primary) hypertension: Secondary | ICD-10-CM

## 2024-04-27 MED ORDER — WALKER MISC: 1.0000 | Freq: Once | 0 refills | Status: AC

## 2024-04-27 NOTE — Progress Notes (Signed)
 Cardiology Office Note    Date:  04/27/2024   ID:  Norman Marks, DOB Dec 06, 1945, MRN [id number]  PCP:  Rilla Baller, MD  Cardiologist:  Deatrice Cage, MD  Electrophysiologist:  Fonda Kitty, MD   Chief Complaint: Follow-up  History of Present Illness:   Norman Marks is a 78 y.o. 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   He underwent cardiac cath in 2009 which showed 1-vessel CAD involving the proximal ramus with 70% stenosis.  He was managed medically.  Follow-up nuclear stress testing in the 12/2013 showed fixed anteroseptal and inferoseptal defects with normal EF.  The defects were felt to be secondary to his left bundle branch block.  He underwent echo in 07/2015 that showed an EF of 50 to 55%, grade 1 diastolic dysfunction, mildly dilated left atrium, mild mitral regurgitation, and mildly elevated PASP at 42 mmHg.  He was seen in 06/2021 with increasing angina.  Echo at that time showed an EF of 50 to 55% with no significant valvular abnormalities.  LHC showed 70% mid LAD stenosis with diffuse 40% disease extending all the way to the ostium, 60% proximal ramus stenosis, and no other obstructive disease.  LVEDP was mildly elevated.  Medical management was recommended with addition of Imdur , though he did not tolerate this medication secondary to headache and dizziness.  Echo in 06/2023 showed an EF of 45 to 50%, hypokinesis of the anterior and anteroseptal region that seemed to be more pronounced than left bundle branch block defect, grade 1 diastolic dysfunction, normal RV systolic function and ventricular cavity size, mild mitral regurgitation, and aortic valve sclerosis without evidence of stenosis.  In this setting, he underwent R/LHC on 08/05/2023 that showed stable two-vessel CAD including ostial/proximal LAD 40% stenosis, mid LAD 70% stenosis, and ramus intermedius is 60% stenosis.  There was  no significant change in coronary anatomy since 2022.  LVEF 35 to 45%.  RHC showed normal right and left-sided filling pressures, normal pulmonary pressure, and normal cardiac output.  Frequent PVCs were noted during the study.  It was suspected his cardiomyopathy was related to PVC burden versus LBBB given significant septal and lateral dyssynchrony noted on echo.  Subsequent Zio patch showed a predominant rhythm of sinus with an average rate of 87 bpm.  Bundle branch block was noted.  Frequent PVCs overall burden of 13.9% along with rare ventricular couplets with a burden of less than 1%.  Ventricular bigeminy and trigeminy were present.     He was seen in the office in 08/2023 and continued to note episodes of chest tightness that would last for 5 to 10 minutes and spontaneously resolved.  He was transitioned from carvedilol  to Toprol -XL 200 mg daily in an effort to suppress PVC burden.  He was evaluated by EP on 10/15/2023 with noted improvement in symptoms following initiation of metoprolol  which was recommended to be continued.  He was initiated on mexiletine 200 mg twice daily.  Repeat Zio patch in 09/2023 showed a predominant rhythm of sinus with an average rate of 85 bpm, 4 episodes of SVT lasting up to 8 beats, occasional PACs with a burden of 2.6%, and rare ventricular ectopy.  He was most recently seen by EP on 11/21/2023 and had inadvertently stopped Toprol  when he initiated mexiletine.  His fatigue and shortness of breath were significantly improved.  It was recommended he restart Toprol -XL at 25 mg  daily and continue mexiletine 200 mg twice daily.  He was last seen by general cardiology in 02/2024 and reported he was doing well until 2 days prior to his appointment when he noted some positional dizziness leading to the reduction of losartan  to 25 mg daily and continuation of Toprol -XL 25 mg with mexiletine.  Echo in 03/2024 showed an EF of 50 to 55%, hypokinesis of the mid to distal anterior and  anteroseptal wall, grade 1 diastolic dysfunction, normal RV systolic function and ventricular cavity size, mild mitral regurgitation, and an estimated right atrial pressure of 3 mmHg.  He comes in doing well from a cardiac perspective and is without symptoms of angina or cardiac decompensation.  He denies any dizziness though does report gait instability.  No presyncope or syncope.  Was previously working with PT for cervical spine issues, this has been discontinued, patient's wife reports he was not following through with exercises.  No falls, hematochezia, or bleeding.  No lower extremity swelling or progressive orthopnea.  Gait instability is his main complaint at this time.  They do request a prescription for cane or walker.   Labs independently reviewed: 03/2024 - A1c 7.6, TC 95, TG 67, HDL 44, LDL 37, potassium 3.8, BUN 9, serum creatinine 0.65, albumin 4.2, AST/ALT normal 08/2023 - magnesium 1.8, TSH normal, Hgb 13.3, PLT 255   Past Medical History:  Diagnosis Date   Basal cell carcinoma 02/10/2020   SEE MEDIA 8 PAGES OF PREVIOUS BCC   Basal cell carcinoma 08/23/2021   left malar cheek   Basal cell carcinoma 11/14/2022   Right dorsal hand - EDC   Basal cell carcinoma 02/14/2023   left scapula lateral, EDC   Basal cell carcinoma 02/14/2023   left scapula medial, EDC   Basal cell carcinoma 09/05/2023   Left upper back paraspinal superior. Superficial and nodular. EDC   Basal cell carcinoma 09/05/2023   Left upper back paraspinal, inferior. Superficial, nodular. EDC   Basal cell carcinoma 12/04/2023   L lat clavicle  nodular - tx with ED&C   BCC (basal cell carcinoma of skin) 04/25/2023   right top of shoulder tx with ED&C   BCC (basal cell carcinoma) 03/22/2021   sup- left chst (CX35FU)   BPH (benign prostatic hypertrophy)    CAD (coronary artery disease) 2009   [name] (cardiolite 2009 with 70% stenosis of proximal ramus, not amenable to PCI, rec treat medically) --> [name]    Cancer (HCC)    basal cell skin cancer   Cataract    removed bilaterally   DDD (degenerative disc disease), lumbar    s/p surgery   Diabetes type 2, uncontrolled 1990s   referred to [facility] 09/2015   Diverticulosis    Headache(784.0)    History of adenomatous polyp of colon 10/2012   History of nephrolithiasis 2012   L ureter, R renal pelvis   HLD (hyperlipidemia)    HTN (hypertension)    mild   Hx of migraines    Left bundle branch block    Osteoarthritis of CMC joint of thumb 2014   s/p injections (Sypher)   Osteoarthritis of right shoulder 2015   s/p injections (Sypher)   Osteopenia 06/2011   T score -3, completed 2 yrs forteo , T score improved to -2.2 (2015)   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   in situ (CX35FU)   SCC (squamous cell carcinoma) 08/23/2021   right posterior mandible-CIS   SCC (squamous cell carcinoma) 08/23/2021  right dorsal hand   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   SEE MEDIA 8 PAGES OF PREVIOUS SCC AND TREATMENTS   Vitamin D  deficiency    Wears hearing aid in both ears     Past Surgical History:  Procedure Laterality Date   BACK SURGERY  1994, 2008   lumbar; x 3, spinal fusion   CARDIAC CATHETERIZATION  2009   70% stenosis ostium   CARDIOVASCULAR STRESS TEST  2011   fixed defect inferior/septal walls without reversibility, LVEF 43% post stress   CATARACT EXTRACTION W/PHACO Right 03/09/2019   Procedure: CATARACT EXTRACTION PHACO AND INTRAOCULAR LENS PLACEMENT (IOC) RIGHT DIABETES;  Surgeon: [name], MD;  Location: [facility];  Service: Ophthalmology;  Laterality: Right;  Diabetic - insulin  and oral meds   CATARACT EXTRACTION W/PHACO Left 04/06/2019   Procedure: CATARACT EXTRACTION PHACO AND INTRAOCULAR LENS PLACEMENT (IOC) LEFT DIABETIC 01:04.2     11.5%      8.58;  Surgeon: [name], MD;  Location: [facility];  Service: Ophthalmology;  Laterality: Left;  Diabetic - insulin   and oral meds   COLONOSCOPY  10/2012   tubular adenoma, mild diverticulosis, rpt 5 yrs Verda)   COLONOSCOPY  12/2017   4 polyps - SSP, TA, diverticulosis (Pyrtle)   COLONOSCOPY  07/2021   TA, diverticulosis, rpt 3 yrs vs none Oletta)   dexa  06/2011   osteoporosis T -2.6 spine, -1.9 hip   dexa  07/2013   osteopenia T -2.2 femur, -1.2 spine   FLEXIBLE SIGMOIDOSCOPY  2007   pt reported normal [facility])   FOOT SURGERY Left 08/2017   HAND SURGERY Left 10/2015   CMC arthritis repair Adolphus)   KNEE SURGERY Right    LEFT HEART CATH AND CORONARY ANGIOGRAPHY N/A 06/26/2021   Procedure: LEFT HEART CATH AND CORONARY ANGIOGRAPHY;  Surgeon: [name], MD;  Location: [facility];  Service: Cardiovascular;  Laterality: N/A;   POLYPECTOMY     RIGHT/LEFT HEART CATH AND CORONARY ANGIOGRAPHY Bilateral 08/05/2023   Procedure: RIGHT/LEFT HEART CATH AND CORONARY ANGIOGRAPHY;  Surgeon: [name], MD;  Location: [facility];  Service: Cardiovascular;  Laterality: Bilateral;   SHOULDER SURGERY Right    SKIN SURGERY  2006   skin cancer   TENDON REPAIR  08/2017   TOTAL SHOULDER ARTHROPLASTY Right 01/26/2014   Procedure: TOTAL SHOULDER ARTHROPLASTY;  Surgeon: [name], MD;  Location: [facility];  Service: Orthopedics;  Laterality: Right;   US  ECHOCARDIOGRAPHY  10/2008   nl LV fxn, EF 50%, mild MR, mild pulm HTN    Current Medications: Current Meds  Medication Sig   aspirin  81 MG tablet Take 81 mg by mouth daily.   Blood Glucose Monitoring Suppl (ACCU-CHEK GUIDE ME) w/Device KIT 1 each by Does not apply route 3 (three) times daily.   celecoxib (CELEBREX) 200 MG capsule Take 200 mg by mouth daily.   Cholecalciferol (VITAMIN D3) 25 MCG (1000 UT) CAPS Take 1 capsule (1,000 Units total) by mouth daily.   cyanocobalamin  (VITAMIN B12) 1000 MCG tablet Take 1 tablet (1,000 mcg total) by mouth daily.   glucose blood (ONETOUCH ULTRA) test strip Use as instructed   insulin   glargine (LANTUS ) 100 UNIT/ML injection Inject 0.25 mLs (25 Units total) into the skin daily.   Insulin  Pen Needle (NOVOFINE PLUS PEN NEEDLE) 32G X 4 MM MISC Use to administer Ozempic  once a week.   Insulin  Syringe-Needle U-100 (TRUEPLUS INSULIN  SYRINGE) 31G X  5/16 1 ML MISC Use as directed to inject insulin  daily   losartan  (COZAAR ) 25 MG tablet Take 1 tablet (25 mg total) by mouth daily.   metFORMIN  (GLUCOPHAGE -XR) 500 MG 24 hr tablet Take 2 tablets (1,000 mg total) by mouth daily with breakfast AND 1 tablet (500 mg total) daily with supper.   metoprolol  succinate (TOPROL  XL) 25 MG 24 hr tablet Take 1 tablet (25 mg total) by mouth daily.   mexiletine (MEXITIL) 200 MG capsule Take 1 capsule (200 mg total) by mouth 2 (two) times daily.   Misc. Devices (WALKER) MISC 1 each by Does not apply route once for 1 dose. Either walker with glide wheels or with slides - patient's preference   polyethylene glycol (MIRALAX  / GLYCOLAX ) 17 g packet Take 17 g by mouth daily as needed for moderate constipation.   rosuvastatin  (CRESTOR ) 20 MG tablet TAKE ONE TABLET (20 MG TOTAL) BY MOUTH DAILY. OVERDUE FOLLOW UP VISIT. PLEASE CALL OFFICE TO SCHEDULE APPOINTMENT PRIOR TO NEXT REFILL    Allergies:   Ace inhibitors, Metformin  and related, and Percocet [oxycodone -acetaminophen ]   Social History   Socioeconomic History   Marital status: Married    Spouse name: Not on file   Number of children: Not on file   Years of education: Not on file   Highest education level: Not on file  Occupational History   Not on file  Tobacco Use   Smoking status: Former    Current packs/day: 0.00    Average packs/day: 0.3 packs/day for 10.0 years (2.5 ttl pk-yrs)    Types: Cigarettes    Start date: 10/21/1982    Quit date: 10/20/1992    Years since quitting: 31.5   Smokeless tobacco: Former    Types: Chew    Quit date: 10/20/1992  Vaping Use   Vaping status: Never Used  Substance and Sexual Activity   Alcohol use: No     Alcohol/week: 0.0 standard drinks of alcohol   Drug use: No   Sexual activity: Not on file  Other Topics Concern   Not on file  Social History Narrative   Caffeine: 1 cup coffee/day   Lives with wife, 1 dog, 5 cats.   Occupation: retired   Edu: 12th grade   Activity: no regular exercise   Diet: good water , fruits/vegetables daily   Social Drivers of Corporate investment banker Strain: Low Risk  (03/04/2020)   Overall Financial Resource Strain (CARDIA)    Difficulty of Paying Living Expenses: Not hard at all  Food Insecurity: No Food Insecurity (03/04/2020)   Hunger Vital Sign    Worried About Running Out of Food in the Last Year: Never true    Ran Out of Food in the Last Year: Never true  Transportation Needs: Not on file  Physical Activity: Inactive (03/04/2020)   Exercise Vital Sign    Days of Exercise per Week: 0 days    Minutes of Exercise per Session: 0 min  Stress: No Stress Concern Present (03/04/2020)   [employer] of Occupational Health - Occupational Stress Questionnaire    Feeling of Stress : Not at all  Social Connections: Not on file     Family History:  The patient's family history includes Cancer in his father and mother; Coronary artery disease (age of onset: 62) in his sister; Diabetes in his brother and father; Heart attack (age of onset: 14) in his sister; Heart disease in his mother. There is no history of Stroke, Colon cancer, Colon  polyps, Esophageal cancer, Rectal cancer, or Stomach cancer.  ROS:   12-point review of systems is negative unless otherwise noted in the HPI.   EKGs/Labs/Other Studies Reviewed:    Studies reviewed were summarized above. The additional studies were reviewed today:  2D echo 07/2015: - Left ventricle: The cavity size was normal. There was mild focal    basal and mild concentric hypertrophy of the septum. Systolic    function was normal. The estimated ejection fraction was in the    range of 50% to 55%. Anteroseptal wall  motion hypokinesis noted,    unable to exclude dyskinesis, possibly secondary to bundle branch    block. Doppler parameters are consistent with abnormal left    ventricular relaxation (grade 1 diastolic dysfunction).  - Mitral valve: There was mild regurgitation.  - Left atrium: The atrium was mildly dilated.  - Right ventricle: Systolic function was normal.  - Pulmonary arteries: Systolic pressure was mildly elevated. PA    peak pressure: 42 mm Hg (S).   Impressions:   - Murmur possibly secondary to proximal septal thickening. No    significant outflow tract gradient. __________   2D echo 06/20/2021: 1. Left ventricular ejection fraction, by estimation, is 50 to 55%. The  left ventricle has low normal function. Left ventricular endocardial  border not optimally defined to evaluate regional wall motion. Left  ventricular diastolic parameters are  consistent with Grade I diastolic dysfunction (impaired relaxation).  Elevated left atrial pressure.   2. Right ventricular systolic function is normal. The right ventricular  size is normal. Tricuspid regurgitation signal is inadequate for assessing  PA pressure.   3. The mitral valve is grossly normal. Trivial mitral valve  regurgitation.   4. The aortic valve is tricuspid. There is mild calcification of the  aortic valve. There is mild thickening of the aortic valve. Aortic valve  regurgitation is not visualized. Aortic valve sclerosis/calcification is  present, without any evidence of  aortic stenosis.  __________   LHC 06/26/2021:   Mid LAD lesion is 70% stenosed.   Ost LAD to Prox LAD lesion is 40% stenosed.   Ramus lesion is 60% stenosed.   The left ventricular systolic function is normal.   LV end diastolic pressure is mildly elevated.   The left ventricular ejection fraction is 50-55% by visual estimate.   1.  Borderline significant two-vessel coronary artery disease.  Stable proximal ramus stenosis was present on previous  cardiac catheterization in 2009.  There is 70% mid LAD stenosis but the proximal LAD to the ostium is moderately diseased as well. 2.  Low normal LV systolic function mildly elevated left ventricular end-diastolic pressure.   Recommendations: Recommend maximizing medical therapy before considering PCI for refractory symptoms.  PCI of the mid LAD can be considered but the proximal and ostial LAD has moderate diffuse disease and might require stenting all the way back to the ostium which is not ideal given the origin of a large ramus branch.  I added Imdur  30 mg daily. __________   2D echo 07/11/2023: 1. Left ventricular ejection fraction, by estimation, is 45 to 50%. The  left ventricle has mildly decreased function. The left ventricle  demonstrates regional wall motion abnormalities (hypokinesis of the  anterior and anteroseptal region). Left  ventricular diastolic parameters are consistent with Grade I diastolic  dysfunction (impaired relaxation). The average left ventricular global  longitudinal strain is -10.9 %.   2. Right ventricular systolic function is normal. The right ventricular  size  is normal. Tricuspid regurgitation signal is inadequate for assessing  PA pressure.   3. The mitral valve is normal in structure. Mild mitral valve  regurgitation. No evidence of mitral stenosis.   4. The aortic valve is normal in structure. There is mild calcification  of the aortic valve. Aortic valve regurgitation is not visualized. Aortic  valve sclerosis is present, with no evidence of aortic valve stenosis.   5. The inferior vena cava is normal in size with greater than 50%  respiratory variability, suggesting right atrial pressure of 3 mmHg.  ___________   [facility] 08/05/2023:   Mid LAD lesion is 70% stenosed.   Ost LAD to Prox LAD lesion is 40% stenosed.   Ramus lesion is 60% stenosed.   There is mild left ventricular systolic dysfunction.   LV end diastolic pressure is moderately  elevated.   The left ventricular ejection fraction is 35-45% by visual estimate.   1.  Stable two-vessel coronary artery disease.  No significant change in coronary anatomy since 2022. 2.  Moderately reduced LV systolic function. 3.  Right heart catheterization showed normal right and left-sided filling pressures, normal pulmonary pressure and normal cardiac output. 4.  Frequent PVCs noted.   Recommendations: Continue medical therapy for coronary artery disease.  I doubt that coronary artery disease is responsible for cardiomyopathy. Suspect that the worsening cardiomyopathy might be PVC induced or left bundle branch block induced given significant septal and lateral dyssynchrony on echo. Will obtain a ZIO monitor to quantify PVC burden and optimize heart failure medications. __________   Zio patch 07/2023: Patient had a min HR of 73 bpm, max HR of 106 bpm, and avg HR of 87 bpm. Predominant underlying rhythm was Sinus Rhythm. Bundle Branch Block/IVCD was present.  Very frequent PVCs with a burden of 14%. __________   Zio patch 09/2023: HR 69 - 162, average 85 bpm. 4 nonsustained SVT (longest 8 beats). No atrial fibrillation detected. Occasional supraventricular ectopy, 2.6%. Rare ventricular ectopy. No sustained arrhythmias. There was 1 symptom trigger episode, which corresponds to sinus rhythm with rare ectopy. __________  Limited echo 04/09/2024: 1. Left ventricular ejection fraction, by estimation, is 50 to 55%. Left  ventricular ejection fraction by PLAX is 37 %. The left ventricle has low  normal function. The left ventricle demonstrates regional wall motion  abnormalities (Hypokinesis of the  mid to distal anterior and anteroseptal wall). Left ventricular diastolic  parameters are consistent with Grade I diastolic dysfunction (impaired  relaxation). The average left ventricular global longitudinal strain is  -20.0 %. The global longitudinal  strain is normal.   2. Right  ventricular systolic function is normal. The right ventricular  size is normal.   3. The mitral valve is normal in structure. Mild mitral valve  regurgitation. No evidence of mitral stenosis.   4. The aortic valve is normal in structure. Aortic valve regurgitation is  not visualized. No aortic stenosis is present.   5. The inferior vena cava is normal in size with greater than 50%  respiratory variability, suggesting right atrial pressure of 3 mmHg.     EKG:  EKG is not ordered today.   Recent Labs: 08/27/2023: Hemoglobin 13.3; Magnesium 1.8; Platelets 255; TSH 0.623 03/27/2024: ALT 24; BUN 9; Creatinine, Ser 0.65; Potassium 3.8; Sodium 143  Recent Lipid Panel    Component Value Date/Time   CHOL 95 03/27/2024 0801   CHOL 83 (L) 09/06/2021 0821   TRIG 67.0 03/27/2024 0801   HDL 44.80 03/27/2024 0801  HDL 33 (L) 09/06/2021 0821   CHOLHDL 2 03/27/2024 0801   VLDL 13.4 03/27/2024 0801   LDLCALC 37 03/27/2024 0801   LDLCALC 35 09/06/2021 0821   LDLDIRECT 65.0 01/23/2016 0931    PHYSICAL EXAM:    VS:  BP 122/60 (BP Location: Left Arm, Patient Position: Sitting, Cuff Size: Normal)   Pulse 71   Ht 5' 7 (1.702 m)   Wt 142 lb 6 oz (64.6 kg)   SpO2 98%   BMI 22.30 kg/m   BMI: Body mass index is 22.3 kg/m.  Physical Exam Vitals reviewed.  Constitutional:      Appearance: He is well-developed.  HENT:     Head: Normocephalic and atraumatic.  Eyes:     General:        Right eye: No discharge.        Left eye: No discharge.  Cardiovascular:     Rate and Rhythm: Normal rate and regular rhythm.     Heart sounds: Normal heart sounds, S1 normal and S2 normal. Heart sounds not distant. No midsystolic click and no opening snap. No murmur heard.    No friction rub.  Pulmonary:     Effort: Pulmonary effort is normal. No respiratory distress.     Breath sounds: Normal breath sounds. No decreased breath sounds, wheezing or rales.  Chest:     Chest wall: No tenderness.   Musculoskeletal:     Cervical back: Normal range of motion.  Skin:    General: Skin is warm and dry.     Nails: There is no clubbing.  Neurological:     Mental Status: He is alert and oriented to person, place, and time.  Psychiatric:        Speech: Speech normal.        Behavior: Behavior normal.        Thought Content: Thought content normal.        Judgment: Judgment normal.     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  Updated echo showed improved and low normal LV systolic function with improvement in PVC burden.  Continue current GDMT including Toprol -XL 25 mg and losartan  25 mg.  Not requiring a standing loop diuretic.  CAD involving the native coronary arteries with stable angina: He is without symptoms of angina.  Recent LHC showed moderate CAD with medical management as outlined above.  Continue aggressive risk factor modification and current pharmacotherapy including aspirin  81 mg, Toprol -XL 25 mg, and rosuvastatin  20 mg.  No indication for further ischemic testing at this time.  Frequent PVCs: Quiescent on Toprol -XL 25 mg and mexiletine 200 mg twice daily.  Management per EP.  HTN: Blood pressure is well-controlled in the office today.  He remains on losartan  25 mg and Toprol -XL 25 mg.  HLD: LDL 37 in 03/2024 with normal AST/ALT at that time.  Remains rosuvastatin  20 mg.  Gait instability: Prescription provided for walker.  Patient would benefit from PT evaluation.  Will defer to PCP.     Disposition: F/u with Dr. Darron or an APP in 6 months, and EP as directed.    Medication Adjustments/Labs and Tests Ordered: Current medicines are reviewed at length with the patient today.  Concerns regarding medicines are outlined above.  Medication changes, Labs and  Tests ordered today are summarized above and listed in the Patient Instructions accessible in Encounters.   Signed, Bernardino Bring, PA-C 04/27/2024 1:05 PM     [facility] [place] [address] [place], [place] [address] [phone]

## 2024-04-27 NOTE — Patient Instructions (Signed)
 Medication Instructions:  Your physician recommends that you continue on your current medications as directed. Please refer to the Current Medication list given to you today.   *If you need a refill on your cardiac medications before your next appointment, please call your pharmacy*  Lab Work: None ordered at this time  If you have labs (blood work) drawn today and your tests are completely normal, you will receive your results only by: MyChart Message (if you have MyChart) OR A paper copy in the mail If you have any lab test that is abnormal or we need to change your treatment, we will call you to review the results.  Testing/Procedures: None ordered at this time   Follow-Up: At [facility], you and your health needs are our priority.  As part of our continuing mission to provide you with exceptional heart care, our providers are all part of one team.  This team includes your primary Cardiologist (physician) and Advanced Practice Providers or APPs (Physician Assistants and Nurse Practitioners) who all work together to provide you with the care you need, when you need it.  Your next appointment:   6 month(s)  Provider:   You may see Deatrice Cage, MD or Bernardino Bring, PA-C  We recommend signing up for the patient portal called MyChart.  Sign up information is provided on this After Visit Summary.  MyChart is used to connect with patients for Virtual Visits (Telemedicine).  Patients are able to view lab/test results, encounter notes, upcoming appointments, etc.  Non-urgent messages can be sent to your provider as well.   To learn more about what you can do with MyChart, go to [email or web address].

## 2024-04-30 ENCOUNTER — Ambulatory Visit: Admitting: *Deleted

## 2024-04-30 DIAGNOSIS — E538 Deficiency of other specified B group vitamins: Secondary | ICD-10-CM | POA: Diagnosis not present

## 2024-04-30 MED ORDER — CYANOCOBALAMIN 1000 MCG/ML IJ SOLN
1000.0000 ug | Freq: Once | INTRAMUSCULAR | Status: AC
  Administered 2024-04-30: 1000 ug via INTRAMUSCULAR

## 2024-04-30 NOTE — Progress Notes (Signed)
 Per orders of Dr. Crawford Givens, injection of Vitamin B12 given by Lorel Monaco in right deltoid. Patient tolerated injection well.

## 2024-06-02 ENCOUNTER — Ambulatory Visit (INDEPENDENT_AMBULATORY_CARE_PROVIDER_SITE_OTHER): Admitting: Dermatology

## 2024-06-02 DIAGNOSIS — W908XXA Exposure to other nonionizing radiation, initial encounter: Secondary | ICD-10-CM

## 2024-06-02 DIAGNOSIS — R234 Changes in skin texture: Secondary | ICD-10-CM

## 2024-06-02 DIAGNOSIS — C44229 Squamous cell carcinoma of skin of left ear and external auricular canal: Secondary | ICD-10-CM

## 2024-06-02 DIAGNOSIS — L821 Other seborrheic keratosis: Secondary | ICD-10-CM

## 2024-06-02 DIAGNOSIS — Z85828 Personal history of other malignant neoplasm of skin: Secondary | ICD-10-CM

## 2024-06-02 DIAGNOSIS — L820 Inflamed seborrheic keratosis: Secondary | ICD-10-CM | POA: Diagnosis not present

## 2024-06-02 DIAGNOSIS — L814 Other melanin hyperpigmentation: Secondary | ICD-10-CM | POA: Diagnosis not present

## 2024-06-02 DIAGNOSIS — L578 Other skin changes due to chronic exposure to nonionizing radiation: Secondary | ICD-10-CM | POA: Diagnosis not present

## 2024-06-02 DIAGNOSIS — Z7189 Other specified counseling: Secondary | ICD-10-CM

## 2024-06-02 DIAGNOSIS — D1801 Hemangioma of skin and subcutaneous tissue: Secondary | ICD-10-CM

## 2024-06-02 DIAGNOSIS — D489 Neoplasm of uncertain behavior, unspecified: Secondary | ICD-10-CM

## 2024-06-02 DIAGNOSIS — L570 Actinic keratosis: Secondary | ICD-10-CM

## 2024-06-02 DIAGNOSIS — Z1283 Encounter for screening for malignant neoplasm of skin: Secondary | ICD-10-CM

## 2024-06-02 DIAGNOSIS — Z8589 Personal history of malignant neoplasm of other organs and systems: Secondary | ICD-10-CM

## 2024-06-02 DIAGNOSIS — D692 Other nonthrombocytopenic purpura: Secondary | ICD-10-CM

## 2024-06-02 NOTE — Patient Instructions (Addendum)
 Electrodesiccation and Curettage ("Scrape and Burn") Wound Care Instructions  Leave the original bandage on for 24 hours if possible.  If the bandage becomes soaked or soiled before that time, it is OK to remove it and examine the wound.  A small amount of post-operative bleeding is normal.  If excessive bleeding occurs, remove the bandage, place gauze over the site and apply continuous pressure (no peeking) over the area for 30 minutes. If this does not work, please call our clinic as soon as possible or page your doctor if it is after hours.   Once a day, cleanse the wound with soap and water . It is fine to shower. If a thick crust develops you may use a Q-tip dipped into dilute hydrogen peroxide (mix 1:1 with water ) to dissolve it.  Hydrogen peroxide can slow the healing process, so use it only as needed.    After washing, apply petroleum jelly (Vaseline) or an antibiotic ointment if your doctor prescribed one for you, followed by a bandage.    For best healing, the wound should be covered with a layer of ointment at all times. If you are not able to keep the area covered with a bandage to hold the ointment in place, this may mean re-applying the ointment several times a day.  Continue this wound care until the wound has healed and is no longer open. It may take several weeks for the wound to heal and close.  Itching and mild discomfort is normal during the healing process.  If you have any discomfort, you can take Tylenol  (acetaminophen ) or ibuprofen as directed on the bottle. (Please do not take these if you have an allergy to them or cannot take them for another reason).  Some redness, tenderness and white or yellow material in the wound is normal healing.  If the area becomes very sore and red, or develops a thick yellow-green material (pus), it may be infected; please notify us .    Wound healing continues for up to one year following surgery. It is not unusual to experience pain in the scar  from time to time during the interval.  If the pain becomes severe or the scar thickens, you should notify the office.    A slight amount of redness in a scar is expected for the first six months.  After six months, the redness will fade and the scar will soften and fade.  The color difference becomes less noticeable with time.  If there are any problems, return for a post-op surgery check at your earliest convenience.  To improve the appearance of the scar, you can use silicone scar gel, cream, or sheets (such as Mederma or Serica) every night for up to one year. These are available over the counter (without a prescription).  Please call our office at [phone] for any questions or concerns.   Biopsy Wound Care Instructions  Leave the original bandage on for 24 hours if possible.  If the bandage becomes soaked or soiled before that time, it is OK to remove it and examine the wound.  A small amount of post-operative bleeding is normal.  If excessive bleeding occurs, remove the bandage, place gauze over the site and apply continuous pressure (no peeking) over the area for 30 minutes. If this does not work, please call our clinic as soon as possible or page your doctor if it is after hours.   Once a day, cleanse the wound with soap and water . It is fine to shower.  If a thick crust develops you may use a Q-tip dipped into dilute hydrogen peroxide (mix 1:1 with water ) to dissolve it.  Hydrogen peroxide can slow the healing process, so use it only as needed.    After washing, apply petroleum jelly (Vaseline) or an antibiotic ointment if your doctor prescribed one for you, followed by a bandage.    For best healing, the wound should be covered with a layer of ointment at all times. If you are not able to keep the area covered with a bandage to hold the ointment in place, this may mean re-applying the ointment several times a day.  Continue this wound care until the wound has healed and is no longer  open.   Itching and mild discomfort is normal during the healing process. However, if you develop pain or severe itching, please call our office.   If you have any discomfort, you can take Tylenol  (acetaminophen ) or ibuprofen as directed on the bottle. (Please do not take these if you have an allergy to them or cannot take them for another reason).  Some redness, tenderness and white or yellow material in the wound is normal healing.  If the area becomes very sore and red, or develops a thick yellow-green material (pus), it may be infected; please notify us .    If you have stitches, return to clinic as directed to have the stitches removed. You will continue wound care for 2-3 days after the stitches are removed.   Wound healing continues for up to one year following surgery. It is not unusual to experience pain in the scar from time to time during the interval.  If the pain becomes severe or the scar thickens, you should notify the office.    A slight amount of redness in a scar is expected for the first six months.  After six months, the redness will fade and the scar will soften and fade.  The color difference becomes less noticeable with time.  If there are any problems, return for a post-op surgery check at your earliest convenience.  To improve the appearance of the scar, you can use silicone scar gel, cream, or sheets (such as Mederma or Serica) every night for up to one year. These are available over the counter (without a prescription).  Please call our office at [phone] for any questions or concerns.      Actinic keratoses are precancerous spots that appear secondary to cumulative UV radiation exposure/sun exposure over time. They are chronic with expected duration over 1 year. A portion of actinic keratoses will progress to squamous cell carcinoma of the skin. It is not possible to reliably predict which spots will progress to skin cancer and so treatment is recommended to  prevent development of skin cancer.  Recommend daily broad spectrum sunscreen SPF 30+ to sun-exposed areas, reapply every 2 hours as needed.  Recommend staying in the shade or wearing long sleeves, sun glasses (UVA+UVB protection) and wide brim hats (4-inch brim around the entire circumference of the hat). Call for new or changing lesions.   Cryotherapy Aftercare  Wash gently with soap and water  everyday.   Apply Vaseline and Band-Aid daily until healed.   Seborrheic Keratosis  What causes seborrheic keratoses? Seborrheic keratoses are harmless, common skin growths that first appear during adult life.  As time goes by, more growths appear.  Some people may develop a large number of them.  Seborrheic keratoses appear on both covered and uncovered body parts.  They  are not caused by sunlight.  The tendency to develop seborrheic keratoses can be inherited.  They vary in color from skin-colored to gray, brown, or even black.  They can be either smooth or have a rough, warty surface.   Seborrheic keratoses are superficial and look as if they were stuck on the skin.  Under the microscope this type of keratosis looks like layers upon layers of skin.  That is why at times the top layer may seem to fall off, but the rest of the growth remains and re-grows.    Treatment Seborrheic keratoses do not need to be treated, but can easily be removed in the office.  Seborrheic keratoses often cause symptoms when they rub on clothing or jewelry.  Lesions can be in the way of shaving.  If they become inflamed, they can cause itching, soreness, or burning.  Removal of a seborrheic keratosis can be accomplished by freezing, burning, or surgery. If any spot bleeds, scabs, or grows rapidly, please return to have it checked, as these can be an indication of a skin cancer.    Melanoma ABCDEs  Melanoma is the most dangerous type of skin cancer, and is the leading cause of death from skin disease.  You are more likely  to develop melanoma if you: Have light-colored skin, light-colored eyes, or red or blond hair Spend a lot of time in the sun Tan regularly, either outdoors or in a tanning bed Have had blistering sunburns, especially during childhood Have a close family member who has had a melanoma Have atypical moles or large birthmarks  Early detection of melanoma is key since treatment is typically straightforward and cure rates are extremely high if we catch it early.   The first sign of melanoma is often a change in a mole or a new dark spot.  The ABCDE system is a way of remembering the signs of melanoma.  A for asymmetry:  The two halves do not match. B for border:  The edges of the growth are irregular. C for color:  A mixture of colors are present instead of an even brown color. D for diameter:  Melanomas are usually (but not always) greater than 6mm - the size of a pencil eraser. E for evolution:  The spot keeps changing in size, shape, and color.  Please check your skin once per month between visits. You can use a small mirror in front and a large mirror behind you to keep an eye on the back side or your body.   If you see any new or changing lesions before your next follow-up, please call to schedule a visit.  Please continue daily skin protection including broad spectrum sunscreen SPF 30+ to sun-exposed areas, reapplying every 2 hours as needed when you're outdoors.   Staying in the shade or wearing long sleeves, sun glasses (UVA+UVB protection) and wide brim hats (4-inch brim around the entire circumference of the hat) are also recommended for sun protection.    Due to recent changes in healthcare laws, you may see results of your pathology and/or laboratory studies on MyChart before the doctors have had a chance to review them. We understand that in some cases there may be results that are confusing or concerning to you. Please understand that not all results are received at the same time  and often the doctors may need to interpret multiple results in order to provide you with the best plan of care or course of treatment. Therefore, we ask  that you please give us  2 business days to thoroughly review all your results before contacting the office for clarification. Should we see a critical lab result, you will be contacted sooner.   If You Need Anything After Your Visit  If you have any questions or concerns for your doctor, please call our main line at [phone] and press option 4 to reach your doctor's medical assistant. If no one answers, please leave a voicemail as directed and we will return your call as soon as possible. Messages left after 4 pm will be answered the following business day.   You may also send us  a message via MyChart. We typically respond to MyChart messages within 1-2 business days.  For prescription refills, please ask your pharmacy to contact our office. Our fax number is [phone].  If you have an urgent issue when the clinic is closed that cannot wait until the next business day, you can page your doctor at the number below.    Please note that while we do our best to be available for urgent issues outside of office hours, we are not available 24/7.   If you have an urgent issue and are unable to reach us , you may choose to seek medical care at your doctor's office, retail clinic, urgent care center, or emergency room.  If you have a medical emergency, please immediately call 911 or go to the emergency department.  Pager Numbers  - Dr. Hester: [phone]  - Dr. Jackquline: [phone]  - Dr. Claudene: [phone]   - Dr. Raymund: [phone]  In the event of inclement weather, please call our main line at [phone] for an update on the status of any delays or closures.  Dermatology Medication Tips: Please keep the boxes that topical medications come in in order to help keep track of the instructions about where and how to use these.  Pharmacies typically print the medication instructions only on the boxes and not directly on the medication tubes.   If your medication is too expensive, please contact our office at [phone] option 4 or send us  a message through MyChart.   We are unable to tell what your co-pay for medications will be in advance as this is different depending on your insurance coverage. However, we may be able to find a substitute medication at lower cost or fill out paperwork to get insurance to cover a needed medication.   If a prior authorization is required to get your medication covered by your insurance company, please allow us  1-2 business days to complete this process.  Drug prices often vary depending on where the prescription is filled and some pharmacies may offer cheaper prices.  The website www.goodrx.com contains coupons for medications through different pharmacies. The prices here do not account for what the cost may be with help from insurance (it may be cheaper with your insurance), but the website can give you the price if you did not use any insurance.  - You can print the associated coupon and take it with your prescription to the pharmacy.  - You may also stop by our office during regular business hours and pick up a GoodRx coupon card.  - If you need your prescription sent electronically to a different pharmacy, notify our office through [facility] or by phone at [phone] option 4.     Si Usted Necesita Algo Despus de Su Visita  Tambin puede enviarnos un mensaje a travs de Clinical Cytogeneticist. Por lo general respondemos a los  mensajes de MyChart en el transcurso de 1 a 2 das hbiles.  Para renovar recetas, por favor pida a su farmacia que se ponga en contacto con nuestra oficina. Randi lakes de fax es [place] [phone].  Si tiene un asunto urgente cuando la clnica est cerrada y que no puede esperar hasta el siguiente da hbil, puede llamar/localizar a su doctor(a) al nmero  que aparece a continuacin.   Por favor, tenga en cuenta que aunque hacemos todo lo posible para estar disponibles para asuntos urgentes fuera del horario de [place], no estamos disponibles las 24 horas del da, los 7 [address]  [address] de la [place].   Si tiene un problema urgente y no puede comunicarse con nosotros, puede optar por buscar atencin mdica  en el consultorio de su doctor(a), en una clnica privada, en un centro de atencin urgente o en una sala de emergencias.  Si tiene engineer, drilling, por favor llame inmediatamente al 911 o vaya a la sala de emergencias.  Nmeros de bper  - Dr. Hester: [phone]  - Dra. Jackquline: [phone]  - Dr. Claudene: [phone]  - Dra. Kitts: [phone]  En caso de inclemencias del [place], por favor llame a nuestra lnea principal al [phone] para una actualizacin sobre el estado de cualquier retraso o cierre.  Consejos para la medicacin en dermatologa: Por favor, guarde las cajas en las que vienen los medicamentos de uso tpico para ayudarle a seguir las instrucciones sobre dnde y cmo usarlos. Las farmacias generalmente imprimen las instrucciones del medicamento slo en las cajas y no directamente en los tubos del [place].   Si su medicamento es muy caro, por favor, pngase en contacto con landry rieger llamando al [phone] y presione la opcin 4 o envenos un mensaje a travs de Clinical Cytogeneticist.   No podemos decirle cul ser su copago por los medicamentos por adelantado ya que esto es diferente dependiendo de la cobertura de su seguro. Sin embargo, es posible que podamos encontrar un medicamento sustituto a audiological scientist un formulario para que el seguro cubra el medicamento que se considera necesario.   Si se requiere una autorizacin previa para que su compaa de seguros cubra su medicamento, por favor permtanos de 1 a 2 das hbiles para completar este proceso.  Los precios de los medicamentos varan con frecuencia  dependiendo del environmental consultant de dnde se surte la receta y alguna farmacias pueden ofrecer precios ms baratos.  El sitio web www.goodrx.com tiene cupones para medicamentos de health and safety inspector. Los precios aqu no tienen en cuenta lo que podra costar con la ayuda del seguro (puede ser ms barato con su seguro), pero el sitio web puede darle el precio si no utiliz tourist information centre manager.  - Puede imprimir el cupn correspondiente y llevarlo con su receta a la farmacia.  - Tambin puede pasar por nuestra oficina durante el horario de atencin regular y education officer, museum una tarjeta de cupones de GoodRx.  - Si necesita que su receta se enve electrnicamente a una farmacia diferente, informe a nuestra oficina a travs de MyChart de [place] o por telfono llamando al [phone] y presione la opcin 4.

## 2024-06-02 NOTE — Progress Notes (Signed)
 Follow-Up Visit   Subjective  Norman Marks is a 78 y.o. male who presents for the following: Skin Cancer Screening and Full Body Skin Exam Hx of bcc Hx of aks and isks  The patient presents for Total-Body Skin Exam (TBSE) for skin cancer screening and mole check. The patient has spots, moles and lesions to be evaluated, some may be new or changing and the patient may have concern these could be cancer.  The following portions of the chart were reviewed this encounter and updated as appropriate: medications, allergies, medical history  Review of Systems:  No other skin or systemic complaints except as noted in HPI or Assessment and Plan.  Objective  Well appearing patient in no apparent distress; mood and affect are within normal limits.  A full examination was performed including scalp, head, eyes, ears, nose, lips, neck, chest, axillae, abdomen, back, buttocks, bilateral upper extremities, bilateral lower extremities, hands, feet, fingers, toes, fingernails, and toenails. All findings within normal limits unless otherwise noted below.   Relevant physical exam findings are noted in the Assessment and Plan.  Crusted patch at lower back  Crusted patch at lower back  Crusted patch at lower back  Crusted patch at posterior neck  Crusted patch at posterior neck  Crusted patch at posterior neck   neck x 7 (7) Erythematous stuck-on, waxy papule or plaque face x 6, arms and hand x 21 (27) Erythematous thin papules/macules with gritty scale.  left ear middle antihelix 1.8 cm pink crusted patch    Assessment & Plan   HISTORY OF BASAL CELL CARCINOMA OF THE SKIN 12/04/2023 left lateral clavicle  treated with ED &C  - No evidence of recurrence today - Recommend regular full body skin exams - Recommend daily broad spectrum sunscreen SPF 30+ to sun-exposed areas, reapply every 2 hours as needed.  - Call if any new or changing lesions are noted between office visits   SKIN  CANCER SCREENING PERFORMED TODAY.  ACTINIC DAMAGE - Chronic condition, secondary to cumulative UV/sun exposure - diffuse scaly erythematous macules with underlying dyspigmentation - Recommend daily broad spectrum sunscreen SPF 30+ to sun-exposed areas, reapply every 2 hours as needed.  - Staying in the shade or wearing long sleeves, sun glasses (UVA+UVB protection) and wide brim hats (4-inch brim around the entire circumference of the hat) are also recommended for sun protection.  - Call for new or changing lesions.  LENTIGINES, SEBORRHEIC KERATOSES, HEMANGIOMAS - Benign normal skin lesions - Benign-appearing - Call for any changes  MELANOCYTIC NEVI - Tan-brown and/or pink-flesh-colored symmetric macules and papules - Benign appearing on exam today - Observation - Call clinic for new or changing moles - Recommend daily use of broad spectrum spf 30+ sunscreen to sun-exposed areas.   Purpura - Chronic; persistent and recurrent.  Treatable, but not curable. - Violaceous macules and patches - Benign - Related to trauma, age, sun damage and/or use of blood thinners, chronic use of topical and/or oral steroids - Observe - Can use OTC arnica containing moisturizer such as Dermend Bruise Formula if desired - Call for worsening or other concerns  Crust at posterior neck and crusted patches on left and right low back  Exam: Crusted patches on posterior neck and crusted patches on left and right low back see photos Treatment Plan: Recheck in future if persistent plan biopsy Will recheck areas in 3 months   INFLAMED SEBORRHEIC KERATOSIS (7) neck x 7 (7) Symptomatic, irritating, patient would like treated. Destruction of  lesion - neck x 7 (7) Complexity: simple   Destruction method: cryotherapy   Informed consent: discussed and consent obtained   Timeout:  patient name, date of birth, surgical site, and procedure verified Lesion destroyed using liquid nitrogen: Yes   Region frozen until  ice ball extended beyond lesion: Yes   Outcome: patient tolerated procedure well with no complications   Post-procedure details: wound care instructions given    ACTINIC KERATOSIS (27) face x 6, arms and hand x 21 (27) Actinic keratoses are precancerous spots that appear secondary to cumulative UV radiation exposure/sun exposure over time. They are chronic with expected duration over 1 year. A portion of actinic keratoses will progress to squamous cell carcinoma of the skin. It is not possible to reliably predict which spots will progress to skin cancer and so treatment is recommended to prevent development of skin cancer.  Recommend daily broad spectrum sunscreen SPF 30+ to sun-exposed areas, reapply every 2 hours as needed.  Recommend staying in the shade or wearing long sleeves, sun glasses (UVA+UVB protection) and wide brim hats (4-inch brim around the entire circumference of the hat). Call for new or changing lesions. Destruction of lesion - face x 6, arms and hand x 21 (27) Complexity: simple   Destruction method: cryotherapy   Informed consent: discussed and consent obtained   Timeout:  patient name, date of birth, surgical site, and procedure verified Lesion destroyed using liquid nitrogen: Yes   Region frozen until ice ball extended beyond lesion: Yes   Outcome: patient tolerated procedure well with no complications   Post-procedure details: wound care instructions given    NEOPLASM OF UNCERTAIN BEHAVIOR left ear middle antihelix Epidermal / dermal shaving  Lesion diameter (cm):  1.8 Informed consent: discussed and consent obtained   Timeout: patient name, date of birth, surgical site, and procedure verified   Procedure prep:  Patient was prepped and draped in usual sterile fashion Prep type:  Isopropyl alcohol Anesthesia: the lesion was anesthetized in a standard fashion   Anesthetic:  1% lidocaine  w/ epinephrine  1-100,000 buffered w/ 8.4% NaHCO3 Instrument used: flexible  razor blade   Hemostasis achieved with: pressure, aluminum chloride and electrodesiccation   Outcome: patient tolerated procedure well   Post-procedure details: sterile dressing applied and wound care instructions given   Dressing type: bandage and petrolatum    Destruction of lesion Complexity: extensive   Destruction method: electrodesiccation and curettage   Informed consent: discussed and consent obtained   Timeout:  patient name, date of birth, surgical site, and procedure verified Procedure prep:  Patient was prepped and draped in usual sterile fashion Prep type:  Isopropyl alcohol Anesthesia: the lesion was anesthetized in a standard fashion   Anesthetic:  1% lidocaine  w/ epinephrine  1-100,000 buffered w/ 8.4% NaHCO3 Curettage performed in three different directions: Yes   Electrodesiccation performed over the curetted area: Yes   Lesion length (cm):  1.8 Lesion width (cm):  1.8 Margin per side (cm):  0.2 Final wound size (cm):  2.2 Hemostasis achieved with:  pressure, aluminum chloride and electrodesiccation Outcome: patient tolerated procedure well with no complications   Post-procedure details: sterile dressing applied and wound care instructions given   Dressing type: bandage and petrolatum    Specimen 1 - Surgical pathology Differential Diagnosis: r/o bcc vs scc  ED&C today  2 pieces in bottle Check Margins: no SKIN CANCER SCREENING   ACTINIC SKIN DAMAGE   HISTORY OF BASAL CELL CARCINOMA   HISTORY OF SQUAMOUS CELL CARCINOMA  PURPURA   CRUST ON SKIN   COUNSELING AND COORDINATION OF CARE   Return for 3 month recheck neck and back aks and isks, 6 month tbse .  IEleanor Blush, CMA, am acting as scribe for Alm Rhyme, MD.   Documentation: I have reviewed the above documentation for accuracy and completeness, and I agree with the above.  Alm Rhyme, MD

## 2024-06-03 ENCOUNTER — Encounter: Payer: Self-pay | Admitting: Dermatology

## 2024-06-03 ENCOUNTER — Ambulatory Visit: Payer: Self-pay | Admitting: Dermatology

## 2024-06-03 ENCOUNTER — Ambulatory Visit

## 2024-06-03 DIAGNOSIS — E538 Deficiency of other specified B group vitamins: Secondary | ICD-10-CM | POA: Diagnosis not present

## 2024-06-03 LAB — SURGICAL PATHOLOGY

## 2024-06-03 MED ORDER — CYANOCOBALAMIN 1000 MCG/ML IJ SOLN
1000.0000 ug | Freq: Once | INTRAMUSCULAR | Status: AC
  Administered 2024-06-03: 1000 ug via INTRAMUSCULAR

## 2024-06-03 NOTE — Progress Notes (Signed)
Per orders of Dr. Javier Gutierrez, injection of vitamin b 12 given by Pricila Bridge in left deltoid. Patient tolerated injection well. Patient will make appointment for 1 month.   

## 2024-06-03 NOTE — Telephone Encounter (Addendum)
 Called and discussed bx results with patient. He verbalized understanding and denied further questions. Will recheck at next follow up.   ----- Message from Alm Rhyme sent at 06/03/2024  5:36 PM EST ----- FINAL DIAGNOSIS        1. 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

## 2024-07-07 ENCOUNTER — Ambulatory Visit

## 2024-07-07 DIAGNOSIS — E538 Deficiency of other specified B group vitamins: Secondary | ICD-10-CM | POA: Diagnosis not present

## 2024-07-07 MED ORDER — CYANOCOBALAMIN 1000 MCG/ML IJ SOLN
1000.0000 ug | Freq: Once | INTRAMUSCULAR | Status: AC
  Administered 2024-07-07: 1000 ug via INTRAMUSCULAR

## 2024-07-07 NOTE — Progress Notes (Signed)
 Per orders of Dr. Anton Blas, injection of vitamin b 12 inj given by Laray Arenas in right deltoid. Patient tolerated injection well. Patient will make appointment for 1 month.

## 2024-07-13 ENCOUNTER — Other Ambulatory Visit: Payer: Self-pay | Admitting: Cardiovascular Disease

## 2024-07-13 ENCOUNTER — Emergency Department

## 2024-07-13 ENCOUNTER — Emergency Department
Admission: EM | Admit: 2024-07-13 | Discharge: 2024-07-13 | Disposition: A | Discharge status: Home / Self Care | Attending: Emergency Medicine | Admitting: Emergency Medicine

## 2024-07-13 DIAGNOSIS — T148XXA Other injury of unspecified body region, initial encounter: Secondary | ICD-10-CM

## 2024-07-13 DIAGNOSIS — W01198A Fall on same level from slipping, tripping and stumbling with subsequent striking against other object, initial encounter: Secondary | ICD-10-CM | POA: Insufficient documentation

## 2024-07-13 DIAGNOSIS — S0511XA Contusion of eyeball and orbital tissues, right eye, initial encounter: Secondary | ICD-10-CM | POA: Insufficient documentation

## 2024-07-13 DIAGNOSIS — S0990XA Unspecified injury of head, initial encounter: Secondary | ICD-10-CM | POA: Diagnosis present

## 2024-07-13 DIAGNOSIS — I2510 Atherosclerotic heart disease of native coronary artery without angina pectoris: Secondary | ICD-10-CM | POA: Diagnosis not present

## 2024-07-13 DIAGNOSIS — E785 Hyperlipidemia, unspecified: Secondary | ICD-10-CM

## 2024-07-13 DIAGNOSIS — Z8673 Personal history of transient ischemic attack (TIA), and cerebral infarction without residual deficits: Secondary | ICD-10-CM | POA: Insufficient documentation

## 2024-07-13 DIAGNOSIS — I5020 Unspecified systolic (congestive) heart failure: Secondary | ICD-10-CM

## 2024-07-13 DIAGNOSIS — I10 Essential (primary) hypertension: Secondary | ICD-10-CM | POA: Insufficient documentation

## 2024-07-13 DIAGNOSIS — I25118 Atherosclerotic heart disease of native coronary artery with other forms of angina pectoris: Secondary | ICD-10-CM

## 2024-07-13 MED ORDER — HYDROCODONE-ACETAMINOPHEN 5-325 MG PO TABS
1.0000 | ORAL_TABLET | Freq: Once | ORAL | Status: AC
  Administered 2024-07-13: 1 via ORAL
  Filled 2024-07-13: qty 1

## 2024-07-13 MED ORDER — HYDROCODONE-ACETAMINOPHEN 5-325 MG PO TABS: 1.0000 | ORAL_TABLET | Freq: Three times a day (TID) | ORAL | 0 refills | Status: AC | PRN

## 2024-07-13 NOTE — ED Provider Notes (Signed)
 "   [facility] Emergency Department Provider Note     Event Date/Time   First MD Initiated Contact with Patient 07/13/24 1521     (approximate)   History   Fall   HPI  Norman Marks is a 78 y.o. 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 He tripped and fell, hitting his head on a wall. He denies LOC or vision change. He endorses a headache and a hematoma to the side of the forehead.  Patient presents to the ED accompanied by his wife, via POV.   Physical Exam   Triage Vital Signs: ED Triage Vitals  Encounter Vitals Group     BP 07/13/24 1458 117/73     Girls Systolic BP Percentile --      Girls Diastolic BP Percentile --      Boys Systolic BP Percentile --      Boys Diastolic BP Percentile --      Pulse Rate 07/13/24 1458 76     Resp 07/13/24 1458 18     Temp 07/13/24 1458 97.9 F (36.6 C)     Temp Source 07/13/24 1458 Oral     SpO2 07/13/24 1458 98 %     Weight 07/13/24 1459 142 lb (64.4 kg)     Height 07/13/24 1459 5' 8 (1.727 m)     Head Circumference --      Peak Flow --      Pain Score 07/13/24 1458 7     Pain Loc --      Pain Education --      Exclude from Growth Chart --     Most recent vital signs: Vitals:   07/13/24 1458  BP: 117/73  Pulse: 76  Resp: 18  Temp: 97.9 F (36.6 C)  SpO2: 98%    General Awake, no distress. NAD HEENT NCAT, except for hematoma over the right brow. PERRL. EOMI. No rhinorrhea. Mucous membranes are moist.  CV:  Good peripheral perfusion.  RRR RESP:  Normal effort.  CTA ABD:  No distention.  Soft and nontender MSK:  AROM of all extremities NEURO: Cranial nerves II to XII grossly intact.  No evidence of any cerebellar ataxia   ED Results / Procedures / Treatments   Labs (all labs ordered are listed, but only abnormal results are displayed) Labs Reviewed - No data to display   EKG   RADIOLOGY  I personally  viewed and evaluated these images as part of my medical decision making, as well as reviewing the written report by the radiologist.  ED Provider Interpretation: No acute intracranial or cervical spine findings  CT Cervical Spine Wo Contrast Result Date: 07/13/2024 EXAM: CT CERVICAL SPINE WITHOUT CONTRAST 07/13/2024 04:28:16 PM TECHNIQUE: CT of the cervical spine was performed without the administration of intravenous contrast. Multiplanar reformatted images are provided for review. Automated exposure control, iterative reconstruction, and/or weight based adjustment of the mA/kV was utilized to reduce the radiation dose to as low as reasonably achievable. COMPARISON: None available. CLINICAL HISTORY: Neck trauma (Age >= 65y) FINDINGS: BONES AND ALIGNMENT: No acute fracture or traumatic malalignment. DEGENERATIVE CHANGES: No high grade osseous spinal canal stenosis. Disc space narrowing most pronounced at C4-C5 and C6-C7. Facet arthrosis and uncovertebral hypertrophy at multiple levels. Foraminal stenosis is greatest on the right at C3-C4 and on the right at C6-C7. SOFT TISSUES: No prevertebral soft tissue swelling. IMPRESSION: 1.  No acute findings. Electronically signed by: Donnice Mania MD 07/13/2024 05:31 PM EST RP Workstation: [id number]   CT HEAD WO CONTRAST ( ) Result Date: 07/13/2024 EXAM: CT HEAD WITHOUT CONTRAST 07/13/2024 04:28:16 PM TECHNIQUE: CT of the head was performed without the administration of intravenous contrast. Automated exposure control, iterative reconstruction, and/or weight based adjustment of the mA/kV was utilized to reduce the radiation dose to as low as reasonably achievable. COMPARISON: MRI head 06/23/2020. CLINICAL HISTORY: Head trauma, moderate-severe. FINDINGS: BRAIN AND VENTRICLES: No acute hemorrhage. No evidence of acute infarct. Age-related atrophy and periventricular white matter disease. Remote right basal ganglia infarct. No hydrocephalus. No extra-axial  collection. No mass effect or midline shift. Vascular calcifications. ORBITS: Intact globes with bilateral lens replacement. SINUSES: No acute abnormality. SOFT TISSUES AND SKULL: Right frontal scalp hematoma without underlying skull fracture. IMPRESSION: 1. No acute intracranial abnormality. 2. Right frontal scalp hematoma without underlying skull fracture. Electronically signed by: Donnice Mania MD 07/13/2024 05:22 PM EST RP Workstation: [id number]     PROCEDURES:  Critical Care performed: No  Procedures   MEDICATIONS ORDERED IN ED: Medications  HYDROcodone -acetaminophen  (NORCO/VICODIN) 5-325 MG per tablet 1 tablet (has no administration in time range)     IMPRESSION / MDM / ASSESSMENT AND PLAN / ED COURSE  I reviewed the triage vital signs and the nursing notes.                              Differential diagnosis includes, but is not limited to, SDH, facial contusion, concussion, cervical fracture, cervical radiculopathy, myalgias  Patient's presentation is most consistent with acute complicated illness / injury requiring diagnostic workup.  Patient's diagnosis is consistent with mechanical fall resulting in facial contusion and hematoma.  [name] patient presents in no acute distress.  Patient alert, oriented, and without evidence of acute neuromuscular visit.  CT images interpreted by me, showed no acute intracranial process or cervical spine findings.  Soft tissue hematoma over the right brow is demonstrated.  Patient stable for outpatient management.  Patient will be discharged home with prescriptions for hydrocodone  (#9). Patient is to follow up with his primary provider as suggested, as needed or otherwise directed. Patient is given ED precautions to return to the ED for any worsening or new symptoms.     FINAL CLINICAL IMPRESSION(S) / ED DIAGNOSES   Final diagnoses:  Minor head injury, initial encounter  Hematoma and contusion     Rx / DC Orders   ED Discharge Orders           Ordered    HYDROcodone -acetaminophen  (NORCO/VICODIN) 5-325 MG tablet  3 times daily PRN        07/13/24 1739             Note:  This document was prepared using Dragon voice recognition software and may include unintentional dictation errors.    Loyd Candida LULLA Aldona, PA-C 07/13/24 1743    Claudene Rover, MD 07/13/24 2209  "

## 2024-07-13 NOTE — ED Triage Notes (Signed)
 Pt presents to the ED via POV from home. 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 Pt denies use of blood thinner. Denies LOC. Large hematoma noted to right side of forehead. Denies dizziness. Reports 7/10 headache. Wife accompanied patient. States that he is acting normally. Pt A&Ox4. Wife reports early stages or dementia.

## 2024-07-13 NOTE — Discharge Instructions (Signed)
 Your exam, and CT scans are normal and reassuring at this time with no signs of a serious injury related to your fall.  The hematoma will resolve in a few days.  You will likely experience some spread of the bruising and swelling to the right eye.  Take OTC Tylenol  or Motrin as needed.  Follow-up with your primary provider for ongoing evaluation.  Return to the ED needed.

## 2024-07-21 ENCOUNTER — Inpatient Hospital Stay: Admitting: Family Medicine

## 2024-07-22 ENCOUNTER — Encounter: Payer: Self-pay | Admitting: Family Medicine

## 2024-07-22 ENCOUNTER — Ambulatory Visit (INDEPENDENT_AMBULATORY_CARE_PROVIDER_SITE_OTHER): Admitting: Family Medicine

## 2024-07-22 VITALS — BP 136/70 | HR 72 | Temp 97.9°F | Ht 68.0 in | Wt 144.8 lb

## 2024-07-22 DIAGNOSIS — E538 Deficiency of other specified B group vitamins: Secondary | ICD-10-CM

## 2024-07-22 DIAGNOSIS — S0993XA Unspecified injury of face, initial encounter: Secondary | ICD-10-CM

## 2024-07-22 DIAGNOSIS — R413 Other amnesia: Secondary | ICD-10-CM

## 2024-07-22 DIAGNOSIS — Z8673 Personal history of transient ischemic attack (TIA), and cerebral infarction without residual deficits: Secondary | ICD-10-CM

## 2024-07-22 DIAGNOSIS — Z85828 Personal history of other malignant neoplasm of skin: Secondary | ICD-10-CM

## 2024-07-22 NOTE — Assessment & Plan Note (Signed)
 In setting of metformin  use  He is now taking oral replacement as well as monthly shots, latest 07/07/2024.  Will recheck B12 at next visit later this month then rpt b12 shot.

## 2024-07-22 NOTE — Assessment & Plan Note (Signed)
 Again recently visualized remote R basal ganglia infarct.

## 2024-07-22 NOTE — Assessment & Plan Note (Signed)
 According to wife, present over the past 6 months.  Will check MMSE when they return later this month for f/u visit.

## 2024-07-22 NOTE — Patient Instructions (Addendum)
 Keep follow up appointment in 2 weeks - we will further check memory testing at that time.  May use cool compresses to forehead.  Take it easy over next few weeks.  Good to see you today

## 2024-07-22 NOTE — Progress Notes (Signed)
 " Ph: [phone] Fax: [phone]   Patient ID: Norman Marks, male    DOB: 1946-06-30, 79 y.o.   MRN: [id number]  This visit was conducted in person.  BP 136/70 (Cuff Size: Normal)   Pulse 72   Temp 97.9 F (36.6 C) (Oral)   Ht 5' 8 (1.727 m)   Wt 144 lb 12.8 oz (65.7 kg)   SpO2 97%   BMI 22.02 kg/m    CC: ER f/u visit  Subjective:   HPI: Norman Marks is a 79 y.o. male presenting on 07/22/2024 for Hospitalization Follow-up (ED 12/29 for mechanical fall. Pt tripped and fell, hitting his head on wall, head CT, no abnormal finding, differential dx given//Pt prescribed  HYDROcodone -acetaminophen  but hasn't taken any since hsp, pt reports no pain//Unrelated to fall - wife is concerned about memory states it has been worsening for 6 months, doesn't want pt driving, said he is not paying attention to surrondings Forgets to shut doors, or where he places things, Pt temper worsening//Pt's wife Nichole in room with pt)   DOI: 07/13/2024 Recent ER visit 07/13/2024 for fall with head injury. Tripped at home on wheelchair, hit head on wall with resultant forehead hematoma. ER records reviewed. He had reassuring head and neck imaging.   He has been using walking stick and rolling walker with seat ever since.  They have been renovating mobile home bathroom.   Treated in ER with hydrocodone  - but hasn't needed any since home. No residual pain, headache, vision changes or confusion.   Wife worried about worsening memory over the past 6 months. Forgetting to close doors, doesn't remember that wife asks him to do things. More irritable recently. Family has not wanted him to drive for a while.   Continues monthly b12 shots, started 03/2024.  Ozempic  stopped last year. He increased lantus  to 28u daily.      Relevant past medical, surgical, family and social history reviewed and updated as indicated. Interim medical history since our last visit reviewed. Allergies and medications reviewed and  updated. Outpatient Medications Prior to Visit  Medication Sig Dispense Refill   aspirin  81 MG tablet Take 81 mg by mouth daily.     Blood Glucose Monitoring Suppl (ACCU-CHEK GUIDE ME) w/Device KIT 1 each by Does not apply route 3 (three) times daily. 1 kit 0   celecoxib (CELEBREX) 200 MG capsule Take 200 mg by mouth daily.     Cholecalciferol (VITAMIN D3) 25 MCG (1000 UT) CAPS Take 1 capsule (1,000 Units total) by mouth daily. 30 capsule    cyanocobalamin  (VITAMIN B12) 1000 MCG tablet Take 1 tablet (1,000 mcg total) by mouth daily.     glucose blood (ONETOUCH ULTRA) test strip Use as instructed 100 each 3   insulin  glargine (LANTUS ) 100 UNIT/ML injection Inject 0.25 mLs (25 Units total) into the skin daily. 30 mL 2   Insulin  Pen Needle (NOVOFINE PLUS PEN NEEDLE) 32G X 4 MM MISC Use to administer Ozempic  once a week. 100 each 3   Insulin  Syringe-Needle U-100 (TRUEPLUS INSULIN  SYRINGE) 31G X 5/16 1 ML MISC Use as directed to inject insulin  daily 100 each 3   losartan  (COZAAR ) 25 MG tablet Take 1 tablet (25 mg total) by mouth daily. 90 tablet 3   metFORMIN  (GLUCOPHAGE -XR) 500 MG 24 hr tablet Take 2 tablets (1,000 mg total) by mouth daily with breakfast AND 1 tablet (500 mg total) daily with supper. 270 tablet 3   metoprolol  succinate (TOPROL  XL) 25  MG 24 hr tablet Take 1 tablet (25 mg total) by mouth daily. 90 tablet 3   mexiletine (MEXITIL) 200 MG capsule Take 1 capsule (200 mg total) by mouth 2 (two) times daily. 180 capsule 3   polyethylene glycol (MIRALAX  / GLYCOLAX ) 17 g packet Take 17 g by mouth daily as needed for moderate constipation.     rosuvastatin  (CRESTOR ) 20 MG tablet TAKE ONE TABLET (20 MG TOTAL) BY MOUTH DAILY. OVERDUE FOLLOW UP VISIT. PLEASE CALL OFFICE TO SCHEDULE APPOINTMENT PRIOR TO NEXT REFILL 28 tablet 1   No facility-administered medications prior to visit.     Per HPI unless specifically indicated in ROS section below Review of Systems  Objective:  BP 136/70 (Cuff  Size: Normal)   Pulse 72   Temp 97.9 F (36.6 C) (Oral)   Ht 5' 8 (1.727 m)   Wt 144 lb 12.8 oz (65.7 kg)   SpO2 97%   BMI 22.02 kg/m   Wt Readings from Last 3 Encounters:  07/22/24 144 lb 12.8 oz (65.7 kg)  07/13/24 142 lb (64.4 kg)  04/27/24 142 lb 6 oz (64.6 kg)      Physical Exam Vitals and nursing note reviewed.  Constitutional:      Appearance: Normal appearance. He is not ill-appearing.  HENT:     Head: Normocephalic. Contusion present.     Jaw: There is normal jaw occlusion.      Comments:  Bruising /ecchymosis traveling down right face with predominant mild tenderness to right forehead  No significant bony tenderness of facial bones     Mouth/Throat:     Mouth: Mucous membranes are moist.     Pharynx: Oropharynx is clear. No oropharyngeal exudate or posterior oropharyngeal erythema.  Eyes:     Extraocular Movements: Extraocular movements intact.     Pupils: Pupils are equal, round, and reactive to light.  Cardiovascular:     Rate and Rhythm: Normal rate and regular rhythm.     Pulses: Normal pulses.     Heart sounds: Normal heart sounds. No murmur heard. Pulmonary:     Effort: Pulmonary effort is normal. No respiratory distress.     Breath sounds: Normal breath sounds. No wheezing, rhonchi or rales.  Musculoskeletal:     Right lower leg: No edema.     Left lower leg: No edema.  Skin:    General: Skin is warm and dry.     Findings: No rash.  Neurological:     Mental Status: He is alert.  Psychiatric:        Mood and Affect: Mood normal.        Behavior: Behavior normal.       CT Cervical Spine Wo Contrast EXAM: CT CERVICAL SPINE WITHOUT CONTRAST 07/13/2024 04:28:16 PM  TECHNIQUE: CT of the cervical spine was performed without the administration of intravenous contrast. Multiplanar reformatted images are provided for review. Automated exposure control, iterative reconstruction, and/or weight based adjustment of the mA/kV was utilized to reduce the  radiation dose to as low as reasonably achievable.  COMPARISON: None available.  CLINICAL HISTORY: Neck trauma (Age >= 65y)  FINDINGS:  BONES AND ALIGNMENT: No acute fracture or traumatic malalignment.  DEGENERATIVE CHANGES: No high grade osseous spinal canal stenosis. Disc space narrowing most pronounced at C4-C5 and C6-C7. Facet arthrosis and uncovertebral hypertrophy at multiple levels. Foraminal stenosis is greatest on the right at C3-C4 and on the right at C6-C7.  SOFT TISSUES: No prevertebral soft tissue swelling.  IMPRESSION: 1. No  acute findings.  Electronically signed by: Donnice Mania MD 07/13/2024 05:31 PM EST RP Workstation: [id number] CT HEAD WO CONTRAST ( ) EXAM: CT HEAD WITHOUT CONTRAST 07/13/2024 04:28:16 PM  TECHNIQUE: CT of the head was performed without the administration of intravenous contrast. Automated exposure control, iterative reconstruction, and/or weight based adjustment of the mA/kV was utilized to reduce the radiation dose to as low as reasonably achievable.  COMPARISON: MRI head 06/23/2020.  CLINICAL HISTORY: Head trauma, moderate-severe.  FINDINGS:  BRAIN AND VENTRICLES: No acute hemorrhage. No evidence of acute infarct. Age-related atrophy and periventricular white matter disease. Remote right basal ganglia infarct. No hydrocephalus. No extra-axial collection. No mass effect or midline shift. Vascular calcifications.  ORBITS: Intact globes with bilateral lens replacement.  SINUSES: No acute abnormality.  SOFT TISSUES AND SKULL: Right frontal scalp hematoma without underlying skull fracture.  IMPRESSION: 1. No acute intracranial abnormality. 2. Right frontal scalp hematoma without underlying skull fracture.  Electronically signed by: Donnice Mania MD 07/13/2024 05:22 PM EST RP Workstation: [id number]  Lab Results  Component Value Date   VITAMINB12 122 (L) 03/27/2024   Lab Results  Component Value Date   HGBA1C  7.6 (H) 03/27/2024   Assessment & Plan:   Problem List Items Addressed This Visit     History of CVA in adulthood   Again recently visualized remote R basal ganglia infarct.       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  Planned skin cancer excision to posterior neck next month.        Vitamin B12 deficiency   In setting of metformin  use  He is now taking oral replacement as well as monthly shots, latest 07/07/2024.  Will recheck B12 at next visit later this month then rpt b12 shot.       Injury of forehead - Primary   ER records reviewed - overall reassuring head and neck CT negative for fractures or head bleed. Did have right forehead hematoma that is resolving well.  Offered outpatient PT balance training/fall prevention program - he declined.  Recommend continued walking stick, walker use.       Memory impairment   According to wife, present over the past 6 months.  Will check MMSE when they return later this month for f/u visit.         No orders of the defined types were placed in this encounter.   No orders of the defined types were placed in this encounter.   Patient Instructions  Keep follow up appointment in 2 weeks - we will further check memory testing at that time.  May use cool compresses to forehead.  Take it easy over next few weeks.  Good to see you today  Follow up plan: No follow-ups on file.  Anton Blas, MD   "

## 2024-07-22 NOTE — Assessment & Plan Note (Signed)
 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  Planned skin cancer excision to posterior neck next month.

## 2024-07-22 NOTE — Assessment & Plan Note (Signed)
 ER records reviewed - overall reassuring head and neck CT negative for fractures or head bleed. Did have right forehead hematoma that is resolving well.  Offered outpatient PT balance training/fall prevention program - he declined.  Recommend continued walking stick, walker use.

## 2024-08-07 ENCOUNTER — Ambulatory Visit: Admitting: Family Medicine

## 2024-08-07 ENCOUNTER — Encounter: Payer: Self-pay | Admitting: Family Medicine

## 2024-08-07 VITALS — BP 138/70 | HR 70 | Temp 97.6°F | Ht 68.0 in | Wt 144.6 lb

## 2024-08-07 DIAGNOSIS — R350 Frequency of micturition: Secondary | ICD-10-CM

## 2024-08-07 DIAGNOSIS — N138 Other obstructive and reflux uropathy: Secondary | ICD-10-CM | POA: Diagnosis not present

## 2024-08-07 DIAGNOSIS — E538 Deficiency of other specified B group vitamins: Secondary | ICD-10-CM

## 2024-08-07 DIAGNOSIS — S8991XA Unspecified injury of right lower leg, initial encounter: Secondary | ICD-10-CM | POA: Diagnosis not present

## 2024-08-07 DIAGNOSIS — Z7984 Long term (current) use of oral hypoglycemic drugs: Secondary | ICD-10-CM | POA: Diagnosis not present

## 2024-08-07 DIAGNOSIS — E1169 Type 2 diabetes mellitus with other specified complication: Secondary | ICD-10-CM

## 2024-08-07 DIAGNOSIS — Z8673 Personal history of transient ischemic attack (TIA), and cerebral infarction without residual deficits: Secondary | ICD-10-CM

## 2024-08-07 DIAGNOSIS — N401 Enlarged prostate with lower urinary tract symptoms: Secondary | ICD-10-CM

## 2024-08-07 DIAGNOSIS — Z794 Long term (current) use of insulin: Secondary | ICD-10-CM | POA: Diagnosis not present

## 2024-08-07 DIAGNOSIS — R413 Other amnesia: Secondary | ICD-10-CM

## 2024-08-07 LAB — POCT GLYCOSYLATED HEMOGLOBIN (HGB A1C): Hemoglobin A1C: 7.6 % — AB (ref 4.0–5.6)

## 2024-08-07 LAB — POC URINALSYSI DIPSTICK (AUTOMATED)
Bilirubin, UA: NEGATIVE
Blood, UA: NEGATIVE
Glucose, UA: POSITIVE — AB
Ketones, UA: NEGATIVE
Leukocytes, UA: NEGATIVE
Nitrite, UA: NEGATIVE
Protein, UA: POSITIVE — AB
Spec Grav, UA: >=1.03 — AB
Urobilinogen, UA: 0.2 U/dL
pH, UA: 6

## 2024-08-07 LAB — CBC WITH DIFFERENTIAL/PLATELET
Basophils Absolute: 0 K/uL (ref 0.0–0.1)
Basophils Relative: 0.4 % (ref 0.0–3.0)
Eosinophils Absolute: 0.1 K/uL (ref 0.0–0.7)
Eosinophils Relative: 1.2 % (ref 0.0–5.0)
HCT: 37.4 % — ABNORMAL LOW (ref 39.0–52.0)
Hemoglobin: 12.7 g/dL — ABNORMAL LOW (ref 13.0–17.0)
Lymphocytes Relative: 19.7 % (ref 12.0–46.0)
Lymphs Abs: 1.1 K/uL (ref 0.7–4.0)
MCHC: 34 g/dL (ref 30.0–36.0)
MCV: 93.2 fl (ref 78.0–100.0)
Monocytes Absolute: 0.5 K/uL (ref 0.1–1.0)
Monocytes Relative: 8.8 % (ref 3.0–12.0)
Neutro Abs: 3.8 K/uL (ref 1.4–7.7)
Neutrophils Relative %: 69.9 % (ref 43.0–77.0)
Platelets: 239 K/uL (ref 150.0–400.0)
RBC: 4.01 Mil/uL — ABNORMAL LOW (ref 4.22–5.81)
RDW: 12.3 % (ref 11.5–15.5)
WBC: 5.4 K/uL (ref 4.0–10.5)

## 2024-08-07 LAB — VITAMIN B12: Vitamin B-12: 617 pg/mL (ref 211–911)

## 2024-08-07 MED ORDER — NONFORMULARY OR COMPOUNDED ITEM: Status: AC

## 2024-08-07 MED ORDER — CYANOCOBALAMIN 1000 MCG/ML IJ SOLN
1000.0000 ug | Freq: Once | INTRAMUSCULAR | Status: AC
  Administered 2024-08-07: 1000 ug via INTRAMUSCULAR

## 2024-08-07 NOTE — Patient Instructions (Addendum)
 Memory testing today overall ok.  Labs today then B12 shot  Urine test today (urinalysis) Start finasteride  5mg  daily sent to pharmacy - this is a slow acting medicine that shrinks size of prostate over time. Only you should handle this medicine  Reviewed 4 core lifestyle modifications to support a healthy mind:  1. Nutritious well balance diet.  2. Regular physical activity routine.  3. Regular mental activity such as reading books, word puzzles, math puzzles, jigsaw puzzles.  4. Social engagement.  Also ensure good blood pressure control, limit alcohol, no smoking.    Return in 4 months for diabetes follow up visit , sooner if needed

## 2024-08-07 NOTE — Progress Notes (Signed)
 S

## 2024-08-07 NOTE — Progress Notes (Unsigned)
 " Ph: [phone] Fax: [phone]   Patient ID: Norman Marks, male    DOB: 07-25-1945, 79 y.o.   MRN: [id number]  This visit was conducted in person.  BP 138/70 (BP Location: Right Arm, Patient Position: Sitting, Cuff Size: Normal)   Pulse 70   Temp 97.6 F (36.4 C) (Oral)   Ht 5' 8 (1.727 m)   Wt 144 lb 9.6 oz (65.6 kg)   SpO2 98%   BMI 21.99 kg/m    CC: fall with injury  Subjective:   HPI: Norman Marks is a 79 y.o. male presenting on 08/07/2024 for Medical Management of Chronic Issues (Pt fell and injured right knee, now hears popping when he walks, area is bruised and swollen - onset 1 week//Frequent urination, some excessive thirst, lightheaded/Has been taking superbeta prostate, would like to discuss with you//Pt's wife ms Kam in room/)   Previously fall s/p ER eval 07/13/2024 where he injured forehead. Tripped on wheelchair at that time.   Recurrent falls:  DOI: 1 wk ago [place] at home while working on the [employer], injured R knee. Persistent pain since then. No knee locking or instability with walking. Again declines outpatient PT eval.   Also notes increased urination, increased thirst, some lightheadedness.  He has been taking super beta prostate OTC for prostate health.  Previous trial flomax  wasn't too helpful.  Lab Results  Component Value Date   PSA 1.97 03/27/2024   PSA 2.06 03/19/2023   PSA 1.83 03/14/2022   CGM data: FL3+ Reader broke - doesn't have recent #s to review. Fasting cbg this morning 95.  H/o remote R basal ganglia infarct.   Geriatric Assessment: Activities of Daily Living:     Bathing- I    Dressing- I    Eating- I    Toileting- I    Transferring- I    Continence- I Overall Assessment: independent  Instrumental Activities of Daily Living:     Transportation- he drives but wife does most of the driving     Meal/Food Preparation- I     Shopping Errands- D    Housekeeping/Chores- I    Money Management/Finances- D     Medication Management- I    Ability to Use Telephone- I    Laundry- D Overall Assessment: largely independent   Mental Status Exam: 30/30 (value/max value) Clock Drawing Score: 4/4        Relevant past medical, surgical, family and social history reviewed and updated as indicated. Interim medical history since our last visit reviewed. Allergies and medications reviewed and updated. Outpatient Medications Prior to Visit  Medication Sig Dispense Refill   aspirin  81 MG tablet Take 81 mg by mouth daily.     Blood Glucose Monitoring Suppl (ACCU-CHEK GUIDE ME) w/Device KIT 1 each by Does not apply route 3 (three) times daily. 1 kit 0   celecoxib (CELEBREX) 200 MG capsule Take 200 mg by mouth daily.     Cholecalciferol (VITAMIN D3) 25 MCG (1000 UT) CAPS Take 1 capsule (1,000 Units total) by mouth daily. 30 capsule    cyanocobalamin  (VITAMIN B12) 1000 MCG tablet Take 1 tablet (1,000 mcg total) by mouth daily.     glucose blood (ONETOUCH ULTRA) test strip Use as instructed 100 each 3   insulin  glargine (LANTUS ) 100 UNIT/ML injection Inject 0.25 mLs (25 Units total) into the skin daily. 30 mL 2   Insulin  Pen Needle (NOVOFINE PLUS PEN NEEDLE) 32G X 4 MM MISC Use to administer Ozempic  once  a week. 100 each 3   Insulin  Syringe-Needle U-100 (TRUEPLUS INSULIN  SYRINGE) 31G X 5/16 1 ML MISC Use as directed to inject insulin  daily 100 each 3   losartan  (COZAAR ) 25 MG tablet Take 1 tablet (25 mg total) by mouth daily. 90 tablet 3   metFORMIN  (GLUCOPHAGE -XR) 500 MG 24 hr tablet Take 2 tablets (1,000 mg total) by mouth daily with breakfast AND 1 tablet (500 mg total) daily with supper. 270 tablet 3   metoprolol  succinate (TOPROL  XL) 25 MG 24 hr tablet Take 1 tablet (25 mg total) by mouth daily. 90 tablet 3   mexiletine (MEXITIL) 200 MG capsule Take 1 capsule (200 mg total) by mouth 2 (two) times daily. 180 capsule 3   polyethylene glycol (MIRALAX  / GLYCOLAX ) 17 g packet Take 17 g by mouth daily  as needed for moderate constipation.     rosuvastatin  (CRESTOR ) 20 MG tablet TAKE ONE TABLET (20 MG TOTAL) BY MOUTH DAILY. OVERDUE FOLLOW UP VISIT. PLEASE CALL OFFICE TO SCHEDULE APPOINTMENT PRIOR TO NEXT REFILL 28 tablet 1   No facility-administered medications prior to visit.     Per HPI unless specifically indicated in ROS section below Review of Systems  Objective:  BP 138/70 (BP Location: Right Arm, Patient Position: Sitting, Cuff Size: Normal)   Pulse 70   Temp 97.6 F (36.4 C) (Oral)   Ht 5' 8 (1.727 m)   Wt 144 lb 9.6 oz (65.6 kg)   SpO2 98%   BMI 21.99 kg/m   Wt Readings from Last 3 Encounters:  08/07/24 144 lb 9.6 oz (65.6 kg)  07/22/24 144 lb 12.8 oz (65.7 kg)  07/13/24 142 lb (64.4 kg)      Physical Exam Vitals and nursing note reviewed.  Constitutional:      Appearance: Normal appearance. He is not ill-appearing.     Comments: Ambulates with cane   HENT:     Head: Normocephalic and atraumatic.  Cardiovascular:     Rate and Rhythm: Normal rate and regular rhythm.     Pulses: Normal pulses.     Heart sounds: Normal heart sounds. No murmur heard. Pulmonary:     Effort: Pulmonary effort is normal. No respiratory distress.     Breath sounds: Normal breath sounds. No wheezing, rhonchi or rales.  Musculoskeletal:        General: No swelling or tenderness.     Right lower leg: No edema.     Left lower leg: No edema.     Comments:  Bruising and abrasion to right lateral knee  FROM bilat knees  Skin:    General: Skin is warm and dry.     Findings: Bruising present. No rash.  Neurological:     Mental Status: He is alert.  Psychiatric:        Mood and Affect: Mood normal.        Behavior: Behavior normal.       Results for orders placed or performed in visit on 08/07/24  HgB A1c   Collection Time: 08/07/24  9:46 AM  Result Value Ref Range   Hemoglobin A1C 7.6 (A) 4.0 - 5.6 %   HbA1c POC (<> result, manual entry)     HbA1c, POC (prediabetic range)      HbA1c, POC (controlled diabetic range)     Lab Results  Component Value Date   NA 143 03/27/2024   CL 106 03/27/2024   K 3.8 03/27/2024   CO2 32 03/27/2024   BUN 9 03/27/2024  CREATININE 0.65 03/27/2024   GFR 89.82 03/27/2024   CALCIUM  9.1 03/27/2024   ALBUMIN 4.2 03/27/2024   GLUCOSE 135 (H) 03/27/2024    Lab Results  Component Value Date   WBC 7.3 08/27/2023   HGB 13.3 08/27/2023   HCT 40.1 08/27/2023   MCV 94 08/27/2023   PLT 255 08/27/2023    Lab Results  Component Value Date   TSH 0.623 08/27/2023    Lab Results  Component Value Date   VITAMINB12 122 (L) 03/27/2024   No results found for: RPR   Assessment & Plan:   Problem List Items Addressed This Visit     Type 2 diabetes mellitus with other specified complication (HCC)   Relevant Orders   HgB A1c (Completed)   BPH with obstruction/lower urinary tract symptoms   Vitamin B12 deficiency   Relevant Orders   Vitamin B12   Memory impairment - Primary   Relevant Orders   TSH   CBC with Differential/Platelet   RPR   Other Visit Diagnoses       Urine frequency       Relevant Orders   POCT Urinalysis Dipstick (Automated)        Meds ordered this encounter  Medications   NONFORMULARY OR COMPOUNDED ITEM    Sig: SUPER BETA PROSTATE 1 tablet daily    Orders Placed This Encounter  Procedures   TSH   CBC with Differential/Platelet   Vitamin B12   RPR   HgB A1c   POCT Urinalysis Dipstick (Automated)    Patient Instructions  Memory testing today overall ok.  Labs today then B12 shot  Urine test today (urinalysis) Start finasteride  5mg  daily sent to pharmacy - this is a slow acting medicine that shrinks size of prostate over time. Only you should handle this medicine  Reviewed 4 core lifestyle modifications to support a healthy mind:  1. Nutritious well balance diet.  2. Regular physical activity routine.  3. Regular mental activity such as reading books, word puzzles, math puzzles,  jigsaw puzzles.  4. Social engagement.  Also ensure good blood pressure control, limit alcohol, no smoking.    Return in 4 months for diabetes follow up visit , sooner if needed  Follow up plan: Return in about 4 months (around 12/05/2024), or if symptoms worsen or fail to improve, for follow up visit.  Anton Blas, MD   "

## 2024-08-08 LAB — TSH: TSH: 0.52 m[IU]/L (ref 0.40–4.50)

## 2024-08-08 LAB — SYPHILIS: RPR W/REFLEX TO RPR TITER AND TREPONEMAL ANTIBODIES, TRADITIONAL SCREENING AND DIAGNOSIS ALGORITHM: RPR Ser Ql: NONREACTIVE

## 2024-08-10 ENCOUNTER — Ambulatory Visit: Payer: Self-pay | Admitting: Family Medicine

## 2024-08-10 DIAGNOSIS — S8991XA Unspecified injury of right lower leg, initial encounter: Secondary | ICD-10-CM | POA: Insufficient documentation

## 2024-08-10 NOTE — Assessment & Plan Note (Signed)
 Chronic, above goal , but overall stable. Continue current regimen including lantus  25u daily (titration limited by am fasting sugars), metformin  XR 1000/500mg  daily.

## 2024-08-10 NOTE — Assessment & Plan Note (Addendum)
 Continue aspirin , statin.

## 2024-08-10 NOTE — Assessment & Plan Note (Addendum)
 07/2024: MMSE 30/30, CDT 4/4, independent in ADLs/IADLs Overall reassuring evaluation today Reviewed lifestyle choices to support a healthy mind as per instructions. Will continue to monitor.  Update TSH, RPR, B12 today.

## 2024-08-10 NOTE — Assessment & Plan Note (Addendum)
 With bothersome symptoms of urinary urgency.  Flomax  trial previously didn't help (2023).  He has started super beta prostate - ok to continue this.  Update urinalysis - no signs of infection Discussed finasteride  use and safe handling of medication - will start finasteride  5mg  daily, update with effect.

## 2024-08-10 NOTE — Assessment & Plan Note (Signed)
 Now on daily b12 replacement.  Update levels then B12 shot.

## 2024-08-10 NOTE — Assessment & Plan Note (Signed)
 Overall reassuring evaluation - anticipate knee strain/contusion, supportive measures reviewed.

## 2024-08-21 ENCOUNTER — Other Ambulatory Visit: Payer: Self-pay | Admitting: Family Medicine

## 2024-08-21 MED ORDER — FINASTERIDE 5 MG PO TABS: 5.0000 mg | ORAL_TABLET | Freq: Every day | ORAL | 6 refills | Status: AC

## 2024-08-21 NOTE — Progress Notes (Signed)
 Erx finasteride  5mg  daily.

## 2024-09-03 ENCOUNTER — Ambulatory Visit: Admitting: Dermatology

## 2024-11-25 ENCOUNTER — Ambulatory Visit: Admitting: Dermatology

## 2024-12-04 ENCOUNTER — Ambulatory Visit: Admitting: Family Medicine
